# Patient Record
Sex: Male | Born: 1945 | Race: White | Hispanic: No | Marital: Married | Smoking: Never smoker
Health system: Southern US, Community
[De-identification: ages and names within clinical notes are randomized; demographics above are authoritative.]

## PROBLEM LIST (undated history)

## (undated) DIAGNOSIS — M549 Dorsalgia, unspecified: Secondary | ICD-10-CM

## (undated) DIAGNOSIS — J019 Acute sinusitis, unspecified: Secondary | ICD-10-CM

## (undated) DIAGNOSIS — R21 Rash and other nonspecific skin eruption: Secondary | ICD-10-CM

## (undated) DIAGNOSIS — I350 Nonrheumatic aortic (valve) stenosis: Secondary | ICD-10-CM

## (undated) DIAGNOSIS — E785 Hyperlipidemia, unspecified: Secondary | ICD-10-CM

## (undated) DIAGNOSIS — G8929 Other chronic pain: Secondary | ICD-10-CM

## (undated) DIAGNOSIS — C801 Malignant (primary) neoplasm, unspecified: Secondary | ICD-10-CM

## (undated) DIAGNOSIS — Z8546 Personal history of malignant neoplasm of prostate: Secondary | ICD-10-CM

## (undated) DIAGNOSIS — I712 Thoracic aortic aneurysm, without rupture: Secondary | ICD-10-CM

## (undated) DIAGNOSIS — K219 Gastro-esophageal reflux disease without esophagitis: Secondary | ICD-10-CM

## (undated) DIAGNOSIS — R1013 Epigastric pain: Secondary | ICD-10-CM

## (undated) DIAGNOSIS — D509 Iron deficiency anemia, unspecified: Secondary | ICD-10-CM

## (undated) DIAGNOSIS — R0602 Shortness of breath: Secondary | ICD-10-CM

## (undated) DIAGNOSIS — I2510 Atherosclerotic heart disease of native coronary artery without angina pectoris: Secondary | ICD-10-CM

## (undated) DIAGNOSIS — D72829 Elevated white blood cell count, unspecified: Secondary | ICD-10-CM

## (undated) DIAGNOSIS — R945 Abnormal results of liver function studies: Secondary | ICD-10-CM

## (undated) DIAGNOSIS — R109 Unspecified abdominal pain: Secondary | ICD-10-CM

## (undated) DIAGNOSIS — G4730 Sleep apnea, unspecified: Secondary | ICD-10-CM

## (undated) DIAGNOSIS — D126 Benign neoplasm of colon, unspecified: Secondary | ICD-10-CM

## (undated) DIAGNOSIS — I10 Essential (primary) hypertension: Secondary | ICD-10-CM

## (undated) DIAGNOSIS — M2550 Pain in unspecified joint: Secondary | ICD-10-CM

## (undated) DIAGNOSIS — K760 Fatty (change of) liver, not elsewhere classified: Secondary | ICD-10-CM

## (undated) DIAGNOSIS — M25475 Effusion, left foot: Secondary | ICD-10-CM

## (undated) DIAGNOSIS — M25474 Effusion, right foot: Secondary | ICD-10-CM

## (undated) DIAGNOSIS — M25471 Effusion, right ankle: Secondary | ICD-10-CM

## (undated) DIAGNOSIS — M1990 Unspecified osteoarthritis, unspecified site: Secondary | ICD-10-CM

## (undated) DIAGNOSIS — I272 Pulmonary hypertension, unspecified: Secondary | ICD-10-CM

## (undated) DIAGNOSIS — M793 Panniculitis, unspecified: Secondary | ICD-10-CM

## (undated) DIAGNOSIS — D759 Disease of blood and blood-forming organs, unspecified: Secondary | ICD-10-CM

## (undated) DIAGNOSIS — K449 Diaphragmatic hernia without obstruction or gangrene: Secondary | ICD-10-CM

## (undated) DIAGNOSIS — M25472 Effusion, left ankle: Secondary | ICD-10-CM

## (undated) HISTORY — DX: Shortness of breath: R06.02

## (undated) HISTORY — DX: Atherosclerotic heart disease of native coronary artery without angina pectoris: I25.10

## (undated) HISTORY — DX: Rash and other nonspecific skin eruption: R21

## (undated) HISTORY — DX: Benign neoplasm of colon, unspecified: D12.6

## (undated) HISTORY — DX: Morbid (severe) obesity due to excess calories: E66.01

## (undated) HISTORY — DX: Disease of blood and blood-forming organs, unspecified: D75.9

## (undated) HISTORY — DX: Iron deficiency anemia, unspecified: D50.9

## (undated) HISTORY — DX: Panniculitis, unspecified: M79.3

## (undated) HISTORY — DX: Pain in unspecified joint: M25.50

## (undated) HISTORY — DX: Unspecified abdominal pain: R10.9

## (undated) HISTORY — DX: Personal history of malignant neoplasm of prostate: Z85.46

## (undated) HISTORY — DX: Sleep apnea, unspecified: G47.30

## (undated) HISTORY — DX: Dorsalgia, unspecified: M54.9

## (undated) HISTORY — PX: OTHER SURGICAL HISTORY: SHX169

## (undated) HISTORY — DX: Effusion, right ankle: M25.471

## (undated) HISTORY — DX: Diaphragmatic hernia without obstruction or gangrene: K44.9

## (undated) HISTORY — DX: Nonrheumatic aortic (valve) stenosis: I35.0

## (undated) HISTORY — PX: TONSILLECTOMY: SUR1361

## (undated) HISTORY — DX: Hyperlipidemia, unspecified: E78.5

## (undated) HISTORY — DX: Thoracic aortic aneurysm, without rupture: I71.2

## (undated) HISTORY — DX: Fatty (change of) liver, not elsewhere classified: K76.0

## (undated) HISTORY — DX: Effusion, left foot: M25.475

## (undated) HISTORY — DX: Effusion, left ankle: M25.472

## (undated) HISTORY — DX: Acute sinusitis, unspecified: J01.90

## (undated) HISTORY — DX: Elevated white blood cell count, unspecified: D72.829

## (undated) HISTORY — DX: Epigastric pain: R10.13

## (undated) HISTORY — DX: Effusion, right ankle: M25.474

## (undated) HISTORY — DX: Abnormal results of liver function studies: R94.5

---
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
## 2007-01-28 ENCOUNTER — Inpatient Hospital Stay (HOSPITAL_COMMUNITY): Admission: EM | Admit: 2007-01-28 | Discharge: 2007-02-03 | Payer: Self-pay | Admitting: Emergency Medicine

## 2007-01-29 ENCOUNTER — Encounter: Payer: Self-pay | Admitting: Gastroenterology

## 2007-02-02 ENCOUNTER — Encounter: Admission: RE | Admit: 2007-02-02 | Discharge: 2007-02-02 | Payer: Self-pay | Admitting: Internal Medicine

## 2007-02-03 ENCOUNTER — Ambulatory Visit: Payer: Self-pay | Admitting: Internal Medicine

## 2007-02-14 ENCOUNTER — Ambulatory Visit: Payer: Self-pay | Admitting: Oncology

## 2007-02-18 ENCOUNTER — Inpatient Hospital Stay (HOSPITAL_COMMUNITY): Admission: EM | Admit: 2007-02-18 | Discharge: 2007-02-21 | Payer: Self-pay | Admitting: Emergency Medicine

## 2007-02-23 LAB — CBC WITH DIFFERENTIAL/PLATELET
Basophils Absolute: 0.1 10*3/uL (ref 0.0–0.1)
HCT: 29.8 % — ABNORMAL LOW (ref 38.7–49.9)
LYMPH%: 24.9 % (ref 14.0–48.0)
MCV: 83.9 fL (ref 81.6–98.0)
MONO%: 4.5 % (ref 0.0–13.0)
NEUT%: 66.5 % (ref 40.0–75.0)

## 2007-02-23 LAB — "ERYTHROCYTE SEDIMENTATION RATE ": Sed Rate: 86 mm/h — ABNORMAL HIGH (ref 0–20)

## 2007-02-23 LAB — CHCC SMEAR

## 2007-02-28 LAB — LACTATE DEHYDROGENASE: LDH: 184 U/L (ref 94–250)

## 2007-02-28 LAB — COMPREHENSIVE METABOLIC PANEL
ALT: 51 U/L (ref 0–53)
Albumin: 3.2 g/dL — ABNORMAL LOW (ref 3.5–5.2)
Alkaline Phosphatase: 193 U/L — ABNORMAL HIGH (ref 39–117)
CO2: 24 mEq/L (ref 19–32)
Glucose, Bld: 104 mg/dL — ABNORMAL HIGH (ref 70–99)
Potassium: 4.7 mEq/L (ref 3.5–5.3)
Sodium: 138 mEq/L (ref 135–145)
Total Protein: 7.5 g/dL (ref 6.0–8.3)

## 2007-02-28 LAB — JAK2 GENOTYPR

## 2007-03-01 ENCOUNTER — Ambulatory Visit: Payer: Self-pay | Admitting: Gastroenterology

## 2007-03-01 LAB — CONVERTED CEMR LAB
AST: 23 units/L (ref 0–37)
Alkaline Phosphatase: 103 units/L (ref 39–117)
Basophils Absolute: 0 10*3/uL (ref 0.0–0.1)
Basophils Relative: 0.1 % (ref 0.0–1.0)
CO2: 24 meq/L (ref 19–32)
Eosinophils Absolute: 0.2 10*3/uL (ref 0.0–0.6)
GFR calc Af Amer: 111 mL/min
HCT: 32.8 % — ABNORMAL LOW (ref 39.0–52.0)
Lymphocytes Relative: 35.6 % (ref 12.0–46.0)
MCHC: 33.4 g/dL (ref 30.0–36.0)
Monocytes Absolute: 0.7 10*3/uL (ref 0.2–0.7)
Neutro Abs: 5.9 10*3/uL (ref 1.4–7.7)
Neutrophils Relative %: 56.2 % (ref 43.0–77.0)
Platelets: 763 10*3/uL — ABNORMAL HIGH (ref 150–400)
Sodium: 136 meq/L (ref 135–145)

## 2007-03-15 LAB — CBC WITH DIFFERENTIAL/PLATELET
Basophils Absolute: 0.1 10*3/uL (ref 0.0–0.1)
EOS%: 2 % (ref 0.0–7.0)
Eosinophils Absolute: 0.2 10*3/uL (ref 0.0–0.5)
HGB: 12.5 g/dL — ABNORMAL LOW (ref 13.0–17.1)
LYMPH%: 37.3 % (ref 14.0–48.0)
MCH: 29.5 pg (ref 28.0–33.4)
MCV: 84.1 fL (ref 81.6–98.0)
MONO%: 5.8 % (ref 0.0–13.0)
Platelets: 319 10*3/uL (ref 145–400)
RDW: 18 % — ABNORMAL HIGH (ref 11.2–14.6)

## 2007-03-18 LAB — COMPREHENSIVE METABOLIC PANEL
AST: 25 U/L (ref 0–37)
Albumin: 4 g/dL (ref 3.5–5.2)
BUN: 21 mg/dL (ref 6–23)
Calcium: 9.5 mg/dL (ref 8.4–10.5)
Chloride: 105 mEq/L (ref 96–112)
Creatinine, Ser: 1.02 mg/dL (ref 0.40–1.50)
Glucose, Bld: 97 mg/dL (ref 70–99)
Potassium: 4.3 mEq/L (ref 3.5–5.3)

## 2007-03-18 LAB — IRON AND TIBC
%SAT: 11 % — ABNORMAL LOW (ref 20–55)
Iron: 39 ug/dL — ABNORMAL LOW (ref 42–165)
TIBC: 346 ug/dL (ref 215–435)
UIBC: 307 ug/dL

## 2007-03-18 LAB — IMMUNOFIXATION ELECTROPHORESIS: IgM, Serum: 84 mg/dL (ref 60–263)

## 2007-04-11 ENCOUNTER — Ambulatory Visit: Payer: Self-pay | Admitting: Gastroenterology

## 2007-04-11 LAB — CONVERTED CEMR LAB
ALT: 33 units/L (ref 0–53)
AST: 26 units/L (ref 0–37)
Alkaline Phosphatase: 54 units/L (ref 39–117)
Bilirubin, Direct: 0.1 mg/dL (ref 0.0–0.3)
Total Protein: 6.4 g/dL (ref 6.0–8.3)

## 2007-05-18 DIAGNOSIS — R1013 Epigastric pain: Secondary | ICD-10-CM | POA: Insufficient documentation

## 2007-05-18 DIAGNOSIS — J019 Acute sinusitis, unspecified: Secondary | ICD-10-CM | POA: Insufficient documentation

## 2007-05-18 DIAGNOSIS — R945 Abnormal results of liver function studies: Secondary | ICD-10-CM

## 2007-05-18 DIAGNOSIS — R109 Unspecified abdominal pain: Secondary | ICD-10-CM

## 2007-05-18 DIAGNOSIS — Z862 Personal history of diseases of the blood and blood-forming organs and certain disorders involving the immune mechanism: Secondary | ICD-10-CM

## 2007-05-18 DIAGNOSIS — Z8639 Personal history of other endocrine, nutritional and metabolic disease: Secondary | ICD-10-CM

## 2007-05-18 DIAGNOSIS — I517 Cardiomegaly: Secondary | ICD-10-CM

## 2007-05-18 DIAGNOSIS — K449 Diaphragmatic hernia without obstruction or gangrene: Secondary | ICD-10-CM | POA: Insufficient documentation

## 2007-05-18 DIAGNOSIS — Z6841 Body Mass Index (BMI) 40.0 and over, adult: Secondary | ICD-10-CM | POA: Insufficient documentation

## 2007-05-18 DIAGNOSIS — D509 Iron deficiency anemia, unspecified: Secondary | ICD-10-CM

## 2007-05-18 DIAGNOSIS — D759 Disease of blood and blood-forming organs, unspecified: Secondary | ICD-10-CM | POA: Insufficient documentation

## 2007-05-18 DIAGNOSIS — D72829 Elevated white blood cell count, unspecified: Secondary | ICD-10-CM | POA: Insufficient documentation

## 2007-05-18 HISTORY — DX: Epigastric pain: R10.13

## 2007-05-18 HISTORY — DX: Elevated white blood cell count, unspecified: D72.829

## 2007-05-18 HISTORY — DX: Diaphragmatic hernia without obstruction or gangrene: K44.9

## 2007-05-18 HISTORY — DX: Disease of blood and blood-forming organs, unspecified: D75.9

## 2007-05-18 HISTORY — DX: Acute sinusitis, unspecified: J01.90

## 2007-05-18 HISTORY — DX: Abnormal results of liver function studies: R94.5

## 2007-05-18 HISTORY — DX: Morbid (severe) obesity due to excess calories: E66.01

## 2007-05-18 HISTORY — DX: Unspecified abdominal pain: R10.9

## 2007-05-18 HISTORY — DX: Iron deficiency anemia, unspecified: D50.9

## 2008-06-17 ENCOUNTER — Emergency Department (HOSPITAL_COMMUNITY): Admission: EM | Admit: 2008-06-17 | Discharge: 2008-06-17 | Payer: Self-pay | Admitting: Emergency Medicine

## 2008-09-26 ENCOUNTER — Encounter: Admission: RE | Admit: 2008-09-26 | Discharge: 2008-09-26 | Payer: Self-pay | Admitting: Family Medicine

## 2010-06-12 ENCOUNTER — Other Ambulatory Visit (HOSPITAL_COMMUNITY): Payer: Self-pay | Admitting: Urology

## 2010-06-12 DIAGNOSIS — C61 Malignant neoplasm of prostate: Secondary | ICD-10-CM

## 2010-06-19 ENCOUNTER — Encounter (HOSPITAL_COMMUNITY)
Admission: RE | Admit: 2010-06-19 | Discharge: 2010-06-19 | Disposition: A | Discharge status: Home / Self Care | Payer: BC Managed Care – PPO | Source: Ambulatory Visit | Attending: Urology | Admitting: Urology

## 2010-06-19 ENCOUNTER — Ambulatory Visit (HOSPITAL_COMMUNITY): Payer: Self-pay

## 2010-06-19 DIAGNOSIS — C61 Malignant neoplasm of prostate: Secondary | ICD-10-CM

## 2010-07-07 ENCOUNTER — Other Ambulatory Visit (HOSPITAL_COMMUNITY): Payer: Self-pay | Admitting: Urology

## 2010-07-07 ENCOUNTER — Encounter (HOSPITAL_COMMUNITY): Payer: BC Managed Care – PPO

## 2010-07-07 ENCOUNTER — Inpatient Hospital Stay (HOSPITAL_COMMUNITY): Admission: RE | Admit: 2010-07-07 | Payer: BC Managed Care – PPO | Source: Ambulatory Visit

## 2010-07-07 DIAGNOSIS — C61 Malignant neoplasm of prostate: Secondary | ICD-10-CM

## 2010-07-09 ENCOUNTER — Encounter (HOSPITAL_COMMUNITY)
Admission: RE | Admit: 2010-07-09 | Discharge: 2010-07-09 | Disposition: A | Discharge status: Home / Self Care | Payer: BC Managed Care – PPO | Source: Ambulatory Visit | Attending: Urology | Admitting: Urology

## 2010-07-09 ENCOUNTER — Ambulatory Visit (HOSPITAL_COMMUNITY)
Admission: RE | Admit: 2010-07-09 | Discharge: 2010-07-09 | Disposition: A | Discharge status: Home / Self Care | Payer: BC Managed Care – PPO | Source: Ambulatory Visit | Attending: Urology | Admitting: Urology

## 2010-07-09 DIAGNOSIS — C61 Malignant neoplasm of prostate: Secondary | ICD-10-CM | POA: Insufficient documentation

## 2010-07-09 MED ORDER — TECHNETIUM TC 99M MEDRONATE IV KIT
25.0000 | PACK | Freq: Once | INTRAVENOUS | Status: AC | PRN
  Administered 2010-07-09: 25 via INTRAVENOUS

## 2010-07-22 ENCOUNTER — Ambulatory Visit: Payer: BC Managed Care – PPO | Attending: Radiation Oncology | Admitting: Radiation Oncology

## 2010-07-22 DIAGNOSIS — Z510 Encounter for antineoplastic radiation therapy: Secondary | ICD-10-CM | POA: Insufficient documentation

## 2010-07-22 DIAGNOSIS — C61 Malignant neoplasm of prostate: Secondary | ICD-10-CM | POA: Insufficient documentation

## 2010-08-26 NOTE — H&P (Signed)
Curtis Hill, REASON NO.:  [id number]   MEDICAL RECORD NO.:  [id number]          PATIENT TYPE:  INP   LOCATION:  5023                         FACILITY:  [facility]   PHYSICIAN:  Kela Millin, M.D.DATE OF BIRTH:  04-Jun-1945   DATE OF ADMISSION:  02/18/2007  DATE OF DISCHARGE:                              HISTORY & PHYSICAL   PRIMARY CARE PHYSICIAN:  Erin E Black, DO.   CHIEF COMPLAINT:  Persistent fevers and abdominal pain.   HISTORY OF PRESENT ILLNESS:  The patient is a 65 year old obese white  male with a past medical history significant for hypertension and gout  recently discharged from the hospital on February 03, 2007, following  extensive evaluation of fevers and abdominal pain.  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 Jacobs/gastroenterology, and that showed  no biliary ductal dilatation, also no strictures and no gallstones noted  at the time.  His MRI also showed upper abdominal lymph nodes and a  portacaval node up to 2.1 cm.  He was found to have elevated LFTs and a  hepatitis panel was done and this was negative.  The LFTs were improving  by the time he was discharged, the patient was also found to have  chronic leukocytosis and after being on antibiotics in the hospital, he  was discharged with a white cell count of 13.9 and he had defervesced  while in the hospital - there was noted in reviewing his discharge  summary that allopurinol had been discontinued because it was thought  that this was a possible cause of his leukocytosis.  The patient reports  that since being discharged, he has continued to have fevers up to 100.5  and has had to be taking Tylenol frequently to keep the fevers down.  He  also reports that he had still been  having abdominal pain and that when  he followed up with his primary care physician, he was started on  Prilosec which helped relieve the mid abdominal/epigastric pains that he  had been having.  He states that he has continued to have pain in his  right upper and left upper quadrants.  He describes this pain as strong  dull ache, 5-10/10 in intensity, and that on the day prior to admission  his PCP started him on Vicodin which is the only thing that has seemed  to help.  He denies nausea and vomiting, diarrhea and he states that the  pain is not worsened by food.  Mr. Curtis Hill denies dysuria, back pain,  chest pain, shortness of breath, leg swelling, melena and no  hematochezia.  He admits to night sweats since he was discharged and  that he has had a cough which is nonproductive as well as a post nasal  drip.  He admits to the 75 pound weight loss over a period  of about  seven months (since April), he states that this has been intentional and  that he was actually on the [employer] diet for some time and that was  how he lost the weight.  His primary care physician also reports that  his hemoglobin has continued to drift down since his discharge.  The  last report was a hemoglobin of about 10.   The patient was seen in the ER and a repeat CT scan of his abdomen was  done which showed no acute findings - no significant change from a  recent CT and MRI of his abdomen, multiple small hypodensities in the  upper liver noted, likely benign given his recent MRI, also periportal  adenopathy unchanged and nonspecific per radiologist.  His temperature  in the ER was 101.9 with blood pressure of 132/89 and his white cell  count was 18.3, hemoglobin 9.8 with a hematocrit of 29.2.  His lipase  was within normal limits at 19 and his LFTs had continued to improve,  alkaline phosphatase 209 with the rest of the LFTs today within normal  limits.  He is admitted for further evaluation and management.    PAST MEDICAL HISTORY:  1. As above.  2. History of cardiomegaly.   MEDICATIONS:  1. Lisinopril/hydrochlorothiazide 20/12.5 mg.  2. Nexium 40 mg daily.  3. Colchicine.  4. Vitamin A   ALLERGIES:  NKDA.   SOCIAL HISTORY:  He denies tobacco.  He also denies alcohol.   FAMILY HISTORY:  His mother had hypertension and diabetes.  No cancers  reported in his family.   REVIEW OF SYSTEMS:  As per HPI, other review of systems negative.   PHYSICAL EXAMINATION:  GENERAL APPEARANCE:  The patient is an obese,  older white male.  He is in no apparent distress.  VITAL SIGNS:  His temperature is 101.9 with a blood pressure of 132/89,  pulse of 94, respiratory rate of 16, O2 sat of 100%.  HEENT:  PERRLA, EOMI, sclerae anicteric, moist mucous membranes and no  oral exudates.  NECK:  Supple, no adenopathy, no thyromegaly and no JVD.  LUNGS:  Decreased breath sounds at the bases, otherwise clear to  auscultation.  CARDIOVASCULAR:  Regular rate and rhythm.  Normal S1, S2, no murmurs  appreciated.  ABDOMEN:  Soft, bowel sounds present, nontender, nondistended.  No  organomegaly and no masses palpable.  EXTREMITIES:  No cyanosis and no edema.  NEUROLOGIC:  She is alert and oriented x3.  Cranial nerves II-XII are  grossly intact.  Nonfocal exam.   LABORATORY DATA:  As per HPI.  His platelet count is 507 with a  neutrophil count of 80%.  Sodium is 136 with a potassium of 3.8,  chloride 101, CO2 of 26, glucose 109, BUN 16, creatinine 1.04.  His AST  is 30, ALT 50, total bilirubin is 0.7, alkaline phosphatase 209,  improved from 230 on February 02, 2007.  His lipase is 19.   ASSESSMENT AND PLAN:  1. Persistent fevers - will obtain blood and urine cultures.  Will      also do a CT scan of his sinuses, as noted above.  CT scan of his      abdomen is unchanged.  The patient is  nontoxic-appearing at this      time, will follow and start antibiotics as clinically indicated      pending above studies.   2. Abdominal pain - as discussed above and status post extensive  workup and examination of his abdomen is rather benign - nontender      with no rebound, and it is noted that his LFTs have improved and      his lipase is within normal limits today.  Also the CT scan is      unchanged as already stated above.  Will continue PPI, pain      management, follow and consult gastroenterology, Dr. Christella Hartigan, for      further recommendations.  3. Anemia - will monitor hemoglobin and hematocrit, obtain stool      guaiacs, further evaluate and manage pending the studies.  4. Leukocytosis - as discussed above, cultures, follow and treat      accordingly.  5. Hypertension - continue Lexapro.  6. Gout - continue colchicine.      Kela Millin, M.D.  Electronically Signed     ACV/MEDQ  D:  02/19/2007  T:  02/20/2007  Job:  [id number]   cc:   Carma Leaven, DO  Rachael Fee, MD

## 2010-08-26 NOTE — Discharge Summary (Signed)
NAMERAFI, KENNETH NO.:  [id number]   MEDICAL RECORD NO.:  [id number]          PATIENT TYPE:  INP   LOCATION:  5023                         FACILITY:  [facility]   PHYSICIAN:  Kela Millin, M.D.DATE OF BIRTH:  08/10/1945   DATE OF ADMISSION:  02/18/2007  DATE OF DISCHARGE:  02/21/2007                               DISCHARGE SUMMARY   DISCHARGE DIAGNOSES:  1. Acute sinusitis, left maxillary.  2. Anemia, mixed.  Hemoccult-negative x3.  3. Abdominal pain , resolved.  CT scan of abdomen and pelvis unchanged      and liver function tests improved.  4. Hypertension.  5. Gout.  6. Morbid obesity.  7. Leukocytosis, improved on antibiotics for number #1, white cell      count today on discharge 12.1, fevers resolved.   PROCEDURES AND STUDIES:  1. CT scan of abdomen and pelvis:  No significant interval change from      comparison CT and MRI of January 28, 2007, and February 02, 2007,      respectively.  Hypodensities within the liver, likely benign as      described on recent MRI.  Periportal adenopathy, nonspecific and      not significantly changed.  Simple left renal cyst.  CT scan of      pelvis shows no acute process within the pelvis, diverticulosis      without evidence of acute inflammation.  2. CT scan of head:  Negative.  3. Maxillofacial CT scan:  Left maxillary sinus air-fluid level      consistent with acute sinusitis.   BRIEF HISTORY:  The patient is a 65 year old white male with the above-  listed medical problems, who presented with persistent fevers and  abdominal pain since he was discharged from the hospital on October 23.  It was noted that he has undergone extensive evaluation prior to  discharge late October.  Since his discharge he had continued to have  fevers up to 100.5.  He has also continued to have a leukocytosis, 18 on  admission.  The patient reported that Prilosec started per his primary  care physician had helped some of his  abdominal pain but he still had  some of that persisting.  He denied diarrhea, nausea and vomiting,  dysuria, melena, and no hematochezia.  His primary care physician had  also noted a gradual decline of his hemoglobin and on admission and it  was noted to be 9.8.  His LFTs had also improved from his previous  tests.   Please see the full admission history and physical of February 18, 2007,  for details of the admission physical exam as well as laboratory data.   HOSPITAL COURSE:  Problem 1.  ACUTE SINUSITIS:  Upon admission the patient had blood and  urine cultures.  A CT scan of his sinuses was also done and the results  as stated above are consistent with acute maxillary sinusitis.  The  patient's blood cultures did not grow any bacteria and the urine  cultures revealed 35,000 CFUs of diphtheroids and the impression was  that this  was most likely a contaminant.  The patient was placed on an  antibiotics for sinusitis.  Following the CT results and he defervesced,  his white cell count actually gradually improved and it is 12.1 today.  He will be discharged on oral antibiotics as well as Claritin D, and he  is to follow up with his primary care physician.   Problem 2.  ANEMIA, MIXED:  The patient's hemoglobin was monitored  during his hospital stay.  Stool guaiacs were also done and these were  negative x3.  An anemia panel was done and it revealed a reticulocyte  count of 0.6, iron level of 16, TIBC of 178 and the ferritin 616, folate  greater than 20, vitamin B12 784.  The patient indicated that his last  colonoscopy was about a year ago per [facility] and, per his report, this  was within normal limits.  The patient has an appointment scheduled with  Dr. Clelia Croft at the cancer center and he has been discharged on iron and  is to follow up with Dr. Clelia Croft for further evaluation and management.   Problem 3.  ABDOMINAL PAIN:  Resolved.  The patient had had extensive  workup as detailed  in his admission history and physical.  He was  maintained on Prilosec during his hospital stay.  He is to continue this  upon discharge and follow up with GI as needed.   Problem 4.  HYPERTENSION:  The patient was maintained on his outpatient  medications during his hospital stay.   Problem 5.  GOUT:  The patient was maintained on colchicine during his  hospital stay.   DISCHARGE MEDICATIONS:  1. Ceftin 500 mg one p.o. b.i.d. for 7 more days.  2. Nu-Iron 150 mg p.o. b.i.d.  3. Senokot two p.o. q.h.s. p.r.n. constipation.  4. Claritin-D one tablet daily.  5. Lisinopril 20 mg daily.  6. Hydrochlorothiazide 12.5 mg daily.  7. Colchicine 0.6 mg b.i.d.  8. Multivitamin daily.  9. Calcium daily.   DISCHARGE CONDITION:  Improved/stable.      Kela Millin, M.D.  Electronically Signed     ACV/MEDQ  D:  02/21/2007  T:  02/22/2007  Job:  [id number]   cc:   Carma Leaven, DO  Blenda Nicely. Elisabeth Most, MD

## 2010-08-26 NOTE — Consult Note (Signed)
NAME:  Curtis Hill, Curtis Hill NO.:  [id number]   MEDICAL RECORD NO.:  [id number]          PATIENT TYPE:  INP   LOCATION:  6705                         FACILITY:  [facility]   PHYSICIAN:  Rachael Fee, MD   DATE OF BIRTH:  [birth year]   DATE OF CONSULTATION:  DATE OF DISCHARGE:                                 CONSULTATION   PRIMARY CARE PHYSICIAN:  Carma Leaven, DO at [facility].   REASON FOR REFERRAL:  Dr. Adela Glimpse asked me to evaluate Curtis Hill in  consultation regarding fever, elevated white count, abnormal liver test.   HISTORY OF PRESENT ILLNESS:  [name] is a very pleasant 65 year old  man who has had intermittent epigastric discomforts over the past week  or so.  He describes it as gassy in some respects and crampy in others.  Food definitely tends to bring it on.  He was having fevers and chills  at home, presented to his primary care physician.  He had a CBC checked  that showed a white count.  He was tried on Nexium and that did not seem  to improve things.  Fevers increased and so he was presented to the  emergency room for evaluation.  In the emergency room, he had the labs  drawn showing a white count of 21,000, hemoglobin of 10.8, normal  platelets with a left shift.  Liver tests showed a total bilirubin of  1.4, alkaline phosphatase of 128, AST of 58, ALT of 87, lipase was  normal.  He had imaging tests last night performed and abdominal  ultrasound showed no definitive sludge or stones in his gallbladder but  his common duct measured 7.8 mm which is slightly dilated.  Quite obese  and the exam was limited due to his size.  Gallbladder showed no signs  of acute cholecystitis.  He had a followup CT scan with IV and oral  contrast.  This showed some fatty infiltration of his liver, question of  focal fat in his liver, a lesion measuring 2.8 x 2.7 cm.  He was  admitted, put on IV antibiotics (Zosyn).  Labs were rechecked this  morning showing his  liver tests had increased to a total bilirubin of  2.0 and alkaline phosphatase of 128, AST of 62, and ALT of 96.  His  white count has increased to 24,000.  He had low grade fevers last night  to 100.4.   REVIEW OF SYSTEMS:  Notable for fevers, chills, abdominal discomfort.  The rest of his review of systems is essentially normal and is available  on his admission H&P.   PAST MEDICAL HISTORY:  Hypertension, gout, morbid obesity.   CURRENT OUTPATIENT MEDICINES:  Lisinopril, hydrochlorothiazide,  allopurinol, Nexium, etodolac taken p.r.n. for pain.  He was started on  Zosyn when he was admitted.   ALLERGIES:  No known drug allergies.   SOCIAL HISTORY:  No tobacco, no alcohol, no drug abuse.   FAMILY HISTORY:  No cancers run in his family.   PHYSICAL EXAMINATION:  VITAL SIGNS:  Temperature this morning was 100.1,  pulse  90, blood pressure 105/68, saturating 96% on room air.  GENERAL:  Compositionally, he is quite obese, otherwise, well appearing.  NEUROLOGIC:  Alert and oriented x3.  HEENT:  Eyes:  Extraocular movements intact.  Mouth and oropharynx  moist, no lesions.  NECK:  Supple, no lymphadenopathy.  CARDIOVASCULAR:  Regular rate and rhythm.  LUNGS:  Clear to auscultation bilaterally.  ABDOMEN:  Soft, mildly tender in the mid epigastrium, nondistended,  normal bowel sounds, no peritoneal signs.  EXTREMITIES:  No lower extremity edema.  SKIN:  No rashes, lesions on bilaterally lower extremities.   ASSESSMENT AND PLAN:  A 65 year old man with fever, chills, increasing  white count and liver tests.  Slightly dilated bile duct of 7.8 mm.   Clinically this is very suspicious for bacterial cholangitis.  He does  have no stones or sludge in his gallbladder, but it was a technically  limited study due to his body size and I suspect he indeed does have  gallstone disease.  We have made arrangements for him to have an ERCP  performed today.  I spoke with his family and he about  possible  complications with ERCP including perforation, bleeding and  pancreatitis.  They understand and wish to proceed.      Rachael Fee, MD  Electronically Signed     DPJ/MEDQ  D:  01/29/2007  T:  01/30/2007  Job:  [id number]   cc:   Carma Leaven, DO  Pollyann Savoy, MD

## 2010-08-26 NOTE — Discharge Summary (Signed)
Curtis Hill, Curtis Hill NO.:  [id number]   MEDICAL RECORD NO.:  [id number]          PATIENT TYPE:  INP   LOCATION:  6714                         FACILITY:  [facility]   PHYSICIAN:  Michelene Gardener, MD    DATE OF BIRTH:  [birth year]   DATE OF ADMISSION:  01/28/2007  DATE OF DISCHARGE:  02/03/2007                               DISCHARGE SUMMARY   PRIMARY CARE PHYSICIAN:  Al Decant. Janey Greaser, M.D.   DISCHARGE DIAGNOSES:  1. Abnormal elevation of liver function test.  2. Cardiomegaly.  3. Hypertension.  4. Gout.  5. Morbid obesity.  6. Chronic leukocytosis, baseline between 13-14.   NEW MEDICATIONS:  Colchicine 0.6 mg p.o. twice daily.   OLD MEDICATIONS:  1. Lisinopril/hydrochlorothiazide 20/12.5 mg p.o. once daily.  2. Aspirin 81 mg p.o. once daily.   MEDICATION TO DISCONTINUE:  Allopurinol   CONSULTATIONS:  GI consult done January 29, 2007.   PROCEDURES:  None.   RADIOLOGY STUDIES:  1. Ultrasound of the abdomen done January 28, 2007, and it showed      echogenic focus in the left hepatic loop likely representing      calcification.  2. Chest x-ray on January 28, 2007 showed cardiomegaly with no acute      disease.  3. CT scan of the abdomen on January 28, 2007 showed fatty      infiltration of the liver with a nonspecific 2.8 cm focal lesion in      the dome of the right hepatic loop.  There is no biliary      dilatation.  4. CT scan of the pelvis without contrast done on January 28, 2007      showed no acute problem.  5. ERCP showed no evidence of biliary ductal dilatation and no      restrictions and there were no gallstones.  6. 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      There are also lymph nodes in the upper abdomen  with a portal caval      node measuring up to 2.1 cm in the short axis.   COURSE OF HOSPITALIZATION:  1. Abnormal elevation of liver function tests.  This patient was      admitted to the hospital for further evaluation.  As mentioned      above, ultrasound was done initially, and it showed a questionable      hepatic loop lesion.  Further evaluation was done by CT scan of the      abdomen, and it showed around a 2.8 cm focal lesion in the right      hepatic loop.  The patient was seen by gastroenterology who      recommended this patient to have ERCP, so the patient underwent  ERCP and that came to be normal and showed no biliary dilatation.      After that, the patient was recommended for MRI of the abdomen to      make sure there was no abscess, given the fact that the patient has      been having elevated white counts.  Open MRI was done, and it came      to be normal, and the lesion that was initially seen in the CAT      scan was not seen in the MRI and is thought to be only focal fat.      Initially, there was some thought about doing a liver biopsy.      Then, after the MRI came to be normal, the gastroenterologist      recommended not to go with biopsy at this time and only to follow      this patient on an outpatient basis.  2. Cardiomegaly.  That was seen on his chest x-ray, and it might be      related to his underlying hypertension, and the patient underwent      echocardiogram, and that showed a normal ejection fraction without      any wall abnormalities.  3. Lymphadenopathy.  The MRI showed some enlarged lymph nodes in the      upper abdomen.  I discussed those findings with the      gastroenterologist who recommended to repeat the CAT scan in 3-6      months.  This will be followed as an outpatient by GI.  Repeat CT      might need to be done just to exclude the possibility of lymphoma.      Those findings were discussed with the patient, and the patient is       aware.  4. Chronic leukocytosis.  Going through his records, this patient has      a white count ranging between 13 and 14 in the last 3 years.  When      he came initially, his white count was elevated, and that raised      the possibility of underlying infection, and, as mentioned above,      liver abscess has been ruled out, and there is no other source of      infection.  At the time of discharge, his white count got back to      his baseline, and it was 13.9.  5. Gout.  His allopurinol was discontinued because it might have      contributed to his leukocytosis, and the patient was switched to      colchicine.  6. Morbid obesity.  The patient was advised about the risk.   TOTAL ASSESSMENT TIME:  Forty minutes.      Michelene Gardener, MD  Electronically Signed     NAE/MEDQ  D:  02/03/2007  T:  02/03/2007  Job:  [id number]   cc:   Al Decant. Janey Greaser, MD  Rachael Fee, MD

## 2010-08-26 NOTE — Assessment & Plan Note (Signed)
[facility]                         GASTROENTEROLOGY OFFICE NOTE   NOMAR, BROAD                          MRN:          [id number]  DATE:04/11/2007                            DOB:          [birth year]    PRIMARY CARE PHYSICIAN:  Dr. Carma Leaven of [facility].   HEMATOLOGIST:  Dr. Blenda Nicely. Shadad.   GI PROBLEM LIST:  Abnormal liver tests.  Presented October 2008 with  abdominal pain, fever, elevated white count, rising transaminases and  bilirubin, slightly dilated common bile duct at 7.8 mm.  Normal  gallbladder without stones on ultrasound.  ERCP performed by me January 29, 2007 showed non-dilated bowel loop without filling defects.  Biliary  sphincterotomy was performed empirically.  Thought he had possibly  already passed the stone.  Multiple imaging studies, including CT scans  IV and oral contrast, MRI were performed because his liver tests did not  improve following the ERCP.  Did not show any specific gallbladder,  biliary, or hepatic problem.  He was sent home on empiric antibiotics  and continued to have fevers and was readmitted to the hospital for  continued workup.  Repeat CT scan of his abdomen showed no changes.  Did  have some mild periportal adenopathy, which was nonspecific.  CT scan of  his head showed left maxillary acute sinusitis on maxillofacial CT scan.  Repeat liver tests in November 2008, were all normal.   INTERVAL HISTORY:  I last saw Mr. Rothman six weeks ago.  Since then he  has felt complete well.  He has had no fevers or chills.  No abdominal  pains.  No nausea or vomiting.   CURRENT MEDICATIONS:  1. Prilosec.  2. Lisinopril..  3. Colchicine.  4. Aspirin.  5. Fish oil.  6. Probiotic.  7. Claritin.  8. Multivitamin.  9. Calcium.  10.Co-Enzyme Q10.  11.Ferrex 150 mg once nightly.   PHYSICAL EXAMINATION:  VITAL SIGNS:  Weight 356 pounds, which is up 35  pounds since his last visit.  (He said it  is not unusual for him to have  15-20 pound swings in very short intervals like this.)  Blood pressure  130/80, pulse 72.  CONSTITUTIONAL:  Generally he is morbidly obese, otherwise well-  appearing.  ABDOMEN:  Soft, nontender, non-distended.  Normal bowel sounds.   ASSESSMENT/PLAN:  A 65 year old man with resolved abnormal liver tests,  resolved fevers.   I did not mention above that Dr. Clelia Croft recently evaluated Mr. Andel as  well, and did not believe that he had any hematologic problems.  He  recommended that Mr. Bula remain on iron once daily until his  prescription runs out.  I think that is very reasonable.  I still do not  have an explanation for his abnormal liver tests.  They probably were  reactive to some type of systemic illness, possibly the sinusitis he was  confirmed to have by CT scan.  He will have repeat liver tests today and  if those are indeed normal, then I see no reason for any further  scheduled GI  workup.   He knows how to get in touch, if he has any further issues.     Rachael Fee, MD  Electronically Signed    DPJ/MedQ  DD: 04/11/2007  DT: 04/11/2007  Job #: [id number]   cc:   Carma Leaven, DO  Blenda Nicely. [employer]

## 2010-08-26 NOTE — H&P (Signed)
NAMEWILBERTH, Curtis Hill NO.:  [id number]   MEDICAL RECORD NO.:  [id number]          PATIENT TYPE:  INP   LOCATION:  1827                         FACILITY:  [facility]   PHYSICIAN:  Michiel Cowboy, MDDATE OF BIRTH:  [birth year]   DATE OF ADMISSION:  01/28/2007  DATE OF DISCHARGE:                              HISTORY & PHYSICAL   STAT PRIORITY ADMISSION HISTORY AND PHYSICAL   PRIMARY CARE PHYSICIAN:  Carma Leaven, DO, with [facility].   Mr. Usman is a gentleman with a history of hypertension, who presents  with abdominal pain as a chief complaint.   The patient was doing well up until one week ago when he developed  epigastric pain, which was continuous in nature.  The patient also  developed what he felt was exsessive gas and abdominal distention.  He  tried to take Gas-X but with no improvement.  He went to see his primary  care physician, who thought that maybe he will benefit from Nexium,  which he started with also no improvement.  The patient had his CBC  checked, which showed elevation in his white blood cell count, and he  was sent to the emergency department for further evaluation.   PAST MEDICAL HISTORY:  1. Hypertension.  2. Gout.   FAMILY HISTORY:  Mother with diabetes and hypertension; no cancers in  the family.   SOCIAL HISTORY:  No tobacco, no alcohol, no drug abuse.   DRUG ALLERGIES:  No known drug allergies.   MEDICATIONS:  1. Lisinopril/hydrochlorothiazide 20/12.5 mg p.o. once a day.  2. Allopurinol 300 mg p.o. once a day.  3. Nexium 40 mg p.o. once a day.  4. Etodolac; the patient was taking this p.r.n. for pain.   REVIEW OF SYSTEMS:  Significant for fevers, chills, and headache.  No  nausea and no vomiting.  No diarrhea and no change in stool.  No urinary  symptoms.  No neurological symptoms.  No chest pain and no shortness of  breath.   PHYSICAL EXAMINATION:  VITAL SIGNS:  Temperature 102.0, pulse 107,  respirations  18, blood pressure 145/92.  GENERAL:  The patient appears to be well and in no acute distress,  complaining of mild abdominal pain.  HEENT:  Moist mucous membranes, PERRLA.  NECK:  Obese, but no lymphadenopathy.  LUNGS:  Clear to auscultation bilaterally.  HEART:  Regular rate and rhythm but rapid, no murmurs noted.  ABDOMEN:  Distended, there is right upper quadrant tenderness as well as  epigastric tenderness, not acute abdomen, no rebound and no guarding  noted.  EXTREMITIES:  Without edema.  NEUROLOGICAL:  Nonfocal.   LABORATORY DATA:  Significant for white blood cell count of 20.8, H&H of  10.8/32.6, platelets of 346, lipase 16, sodium 136, potassium 3.7,  creatinine 1.0, calcium 8.6, albumin 2.9, AST 53, ALT 87, alk-phos 128,  total bili 1.4.  Urine negative for white blood cells but does have  ketones and trace protein.   RADIOLOGICAL STUDIES:  A right upper quadrant ultrasound showing a  technically limited exam.  There is a  left hepatic lobe calcification,  no cholelithiasis, no acute cholecystitis, borderline enlargement of  common bile duct.  A CT of the abdomen I have discussed with Radiology  and on preliminary evaluation, no biliary abnormalities were noted.  The  patient does have a cyst on his kidney, but no pancreatitis, no  gallbladder stones were noted, and no bowel obstruction.  A final read  is not up yet.   ASSESSMENT AND PLAN:  This is a 65 year old gentleman with abdominal  pain and fever, a slight elevation of liver function tests, a question  of cholangitis.  1. Abdominal pain, fever, a question of cholangitis:  Will admit to      [facility].  Have talked over the phone with Dr. Christella Hartigan of      [facility], who will see patient in the morning.  Will hold off on      an endoscopic retrograde cholangiopancreatography (ERCP) at this      point or magnetic cholangiopancreatography (MRCP) and see how      patient does.  Will check his liver function  tests, wait for his      other blood culture, give Zosyn for potential infectious sources.      Will also obtain chest x-ray to watch for any other etiologies of      infection and keep patient nil per os (NPO).  Doubt will continue      Allopurinol.  2. Hypertension:  Will hold his antihypertensives for now while      patient acutely ill.  3. Prophylaxis:  Will choose sequential compression devices instead of      Lovenox given the patient may need invasive procedure potentially.      The patient is a Full Code.      Michiel Cowboy, MD  Electronically Signed     AVD/MEDQ  D:  01/28/2007  T:  01/28/2007  Job:  [id number]

## 2010-08-26 NOTE — Assessment & Plan Note (Signed)
HEALTHCARE                         GASTROENTEROLOGY OFFICE NOTE   DELTA, DESHMUKH                          MRN:          [id number]  DATE:03/01/2007                            DOB:          September 27, 1945    PRIMARY CARE PHYSICIAN:  Dr. Rosey Bath of [facility].   HEMATOLOGIST:  Dr. Clelia Croft.   GI PROBLEM LIST:  Abnormal liver tests.  Presented October 2008 with  abdominal pain, fever, elevated white count, rising transaminases and  bilirubin, slightly dilated common bile duct at 7.8 mm.  Normal  gallbladder without stones on ultrasound.  ERCP performed by me January 29, 2007 showed non-dilated bowel loop without filling defects.  Biliary  sphincterotomy was performed empirically.  Thought he had possibly  already passed the stone.  Multiple imaging studies, including CT scans  IV and oral contrast, MRI were performed because his liver tests did not  improve following the ERCP.  Did not show any specific gallbladder,  biliary, or hepatic problem.  He was sent home on empiric antibiotics  and continued to have fevers and was readmitted to the hospital for  continued workup.  Repeat CT scan of his abdomen showed no changes.  Did  have some mild periportal adenopathy, which was nonspecific.  CT scan of  his head showed left maxillary acute sinusitis on maxillofacial CT scan.   INTERVAL HISTORY:  I last saw Mr. Elison when he was hospitalized in  October of 2008.  His recent course is summarized above.  No etiology  was ever found for his abnormal liver tests.  Hepatitis A, B, and C were  negative.  Imaging studies failed to show any definite hepatic or  biliary abnormalities.  Fortunately, his liver tests have normalized.  The fevers he was having around the same time may be from the acute  sinusitis that was eventually detected by head CT.  He does have chronic  anemia.  He has actually been seen by Dr. Clelia Croft about that, who thought  that  possibly he had a virus or bacterial infection that has caused some  bone marrow suppression.  Stool studies have been done 3 times recently  and they were all negative.  He has had a colonoscopy 1 year ago by Dr.  Doy Mince as an outpatient.   CURRENT MEDICATIONS:  Prilosec.  Lisinopril.  Colchicine.  Aspirin.  Fish oil.  Probiotic.  Claritin.  Multivitamin.  Calcium.  Coenzyme Q-  10.  Iron.   PHYSICAL EXAM:  Height 5 feet 10 inches, 321 pounds.  Blood pressure  124/84, pulse 120.  CONSTITUTIONAL:  Well-appearing.  ABDOMEN:  Soft, nontender, nondistended.  Normal bowel sounds.   ASSESSMENT AND PLAN:  A 65 year old man with resolved abnormal liver  tests, resolving fevers.   It is not clear what led to Mr. Canavan constellation of symptoms over  the past 1 to 2 months.  He is very clear that he has no abdominal pain  and, actually since starting Prilosec on a daily basis, he has no  bloating or dyspeptic-type symptoms.  He says his  fevers are gone.  Possibly the sinusitis is accounting for his fevers.  I think I would  simply like to observe him clinically.  I  will repeat a set of labs today, including a CBC, complete metabolic  profile.  He will return to see me in 1 month's time.     Rachael Fee, MD  Electronically Signed    DPJ/MedQ  DD: 03/01/2007  DT: 03/02/2007  Job #: [id number]   cc:   Carma Leaven, DO  Blenda Nicely. [employer]

## 2010-10-20 ENCOUNTER — Ambulatory Visit
Admission: RE | Admit: 2010-10-20 | Discharge: 2010-10-20 | Disposition: A | Discharge status: Home / Self Care | Payer: BC Managed Care – PPO | Source: Ambulatory Visit | Attending: Radiation Oncology | Admitting: Radiation Oncology

## 2010-10-20 DIAGNOSIS — Z510 Encounter for antineoplastic radiation therapy: Secondary | ICD-10-CM | POA: Insufficient documentation

## 2010-10-20 DIAGNOSIS — C61 Malignant neoplasm of prostate: Secondary | ICD-10-CM | POA: Insufficient documentation

## 2010-10-20 DIAGNOSIS — R5381 Other malaise: Secondary | ICD-10-CM | POA: Insufficient documentation

## 2010-11-17 ENCOUNTER — Ambulatory Visit (HOSPITAL_COMMUNITY): Payer: BC Managed Care – PPO

## 2010-11-17 ENCOUNTER — Ambulatory Visit (HOSPITAL_COMMUNITY)
Admission: RE | Admit: 2010-11-17 | Discharge: 2010-11-17 | Disposition: A | Discharge status: Home / Self Care | Payer: BC Managed Care – PPO | Source: Ambulatory Visit | Attending: Radiation Oncology | Admitting: Radiation Oncology

## 2010-11-17 ENCOUNTER — Other Ambulatory Visit: Payer: Self-pay | Admitting: Radiation Oncology

## 2010-11-17 DIAGNOSIS — I10 Essential (primary) hypertension: Secondary | ICD-10-CM | POA: Insufficient documentation

## 2010-11-17 DIAGNOSIS — D72829 Elevated white blood cell count, unspecified: Secondary | ICD-10-CM | POA: Insufficient documentation

## 2010-11-17 DIAGNOSIS — Z79899 Other long term (current) drug therapy: Secondary | ICD-10-CM | POA: Insufficient documentation

## 2010-11-17 DIAGNOSIS — C61 Malignant neoplasm of prostate: Secondary | ICD-10-CM

## 2010-11-17 DIAGNOSIS — D649 Anemia, unspecified: Secondary | ICD-10-CM | POA: Insufficient documentation

## 2010-11-17 DIAGNOSIS — R9431 Abnormal electrocardiogram [ECG] [EKG]: Secondary | ICD-10-CM | POA: Insufficient documentation

## 2010-11-17 DIAGNOSIS — D473 Essential (hemorrhagic) thrombocythemia: Secondary | ICD-10-CM | POA: Insufficient documentation

## 2010-12-01 ENCOUNTER — Encounter (HOSPITAL_COMMUNITY): Payer: BC Managed Care – PPO

## 2010-12-01 ENCOUNTER — Other Ambulatory Visit: Payer: Self-pay | Admitting: Urology

## 2010-12-01 LAB — BASIC METABOLIC PANEL
BUN: 18 mg/dL (ref 6–23)
CO2: 30 mEq/L (ref 19–32)
Calcium: 10 mg/dL (ref 8.4–10.5)
Creatinine, Ser: 0.86 mg/dL (ref 0.50–1.35)
GFR calc Af Amer: >60 mL/min (ref >60)
GFR calc non Af Amer: >60 mL/min (ref >60)
Glucose, Bld: 91 mg/dL (ref 70–99)
Potassium: 3.7 mEq/L (ref 3.5–5.1)
Sodium: 138 mEq/L (ref 135–145)

## 2010-12-01 LAB — CBC
Hemoglobin: 12.2 g/dL — ABNORMAL LOW (ref 13.0–17.0)
MCHC: 32.4 g/dL (ref 30.0–36.0)

## 2010-12-01 LAB — SURGICAL PCR SCREEN: Staphylococcus aureus: NEGATIVE

## 2010-12-10 ENCOUNTER — Ambulatory Visit (HOSPITAL_COMMUNITY)
Admission: RE | Admit: 2010-12-10 | Discharge: 2010-12-10 | Disposition: A | Discharge status: Home / Self Care | Payer: BC Managed Care – PPO | Source: Ambulatory Visit | Attending: Urology | Admitting: Urology

## 2010-12-10 ENCOUNTER — Ambulatory Visit (HOSPITAL_COMMUNITY): Payer: BC Managed Care – PPO

## 2010-12-10 DIAGNOSIS — Z01812 Encounter for preprocedural laboratory examination: Secondary | ICD-10-CM | POA: Insufficient documentation

## 2010-12-10 DIAGNOSIS — M109 Gout, unspecified: Secondary | ICD-10-CM | POA: Insufficient documentation

## 2010-12-10 DIAGNOSIS — C61 Malignant neoplasm of prostate: Secondary | ICD-10-CM | POA: Insufficient documentation

## 2010-12-10 DIAGNOSIS — I10 Essential (primary) hypertension: Secondary | ICD-10-CM | POA: Insufficient documentation

## 2010-12-31 NOTE — Op Note (Signed)
  NAME:  Curtis Hill, DOTSON NO.:  [id number]  MEDICAL RECORD NO.:  [id number]  LOCATION:  DAYL                         FACILITY:  [facility]  PHYSICIAN:  Valetta Fuller, MD    DATE OF BIRTH:  Oct 19, 1945  DATE OF PROCEDURE: DATE OF DISCHARGE:  12/10/2010                              OPERATIVE REPORT   PREOPERATIVE DIAGNOSIS:  Adenocarcinoma of the prostate.  POSTOPERATIVE DIAGNOSIS:  Adenocarcinoma of the prostate.  PROCEDURE PERFORMED: 1. Radioactive prostate seed implantation with Nucleotron robotic     implanter. 2. Flexible cystoscopy.  SURGEON:  Valetta Fuller, MD and Maryln Gottron, M.D.  ANESTHESIA:  General.  INDICATIONS:  Curtis Hill is 65 years of age.  He was originally a patient of Dr. Londell Moh Kimbrough who was subsequent retired from our practice.  The patient was diagnosed with high-risk adenocarcinoma of the prostate.  He was not a candidate for IMRT radiation due to his body habitus.  He was also not felt to be a surgical candidate and decision by Dr. Aldean Ast, Dr. Dayton Scrape, and the patient was to undergo 5 weeks of external beam radiation therapy followed by his seed implantation.  The patient has also been on hormonal therapy.  The patient has completed his external beam radiation therapy and presents now for seed boost. The patient appeared to understand the advantages and disadvantages, potential complications of this treatment.  The patient has received perioperative ciprofloxacin and he has had placement of PAS compression boots.  DESCRIPTION OF PROCEDURE:  Technique advised.  The patient was brought to the operating room where he had successful induction of general anesthesia.  He was placed in lateral lithotomy position and carefully prepped and draped in the usual manner.  Radiation Oncology initiated his case by placing a transrectal ultrasound probe which was anchored to a floor stand.  Anchoring needles were placed in the prostate and  a Foley catheter inserted with contrast in the balloon.  Real time contouring of the prostate, urethra, and rectum were then performed. Real time dose was established.  We were then called to the operating room for placement of the needles.  A second surgical time-out was performed.  Needle passage was done with real time sagittal ultrasound imaging.  A total of 70 needles were placed with a total number of 46 active seeds.  Total dose delivered was 110 Gy.  No complications or problems occurred.  Seed distribution appeared to be satisfactory.  The Foley catheter was removed and flexible cystoscopy revealed a small prostate, which was nonobstructing and no prostate seeds were noted in the bladder or prostatic urethra.  A new catheter was placed which drained clear urine.  The patient was brought to recovery room in stable condition having had no obvious complications or problems.     Valetta Fuller, MD     DSG/MEDQ  D:  12/11/2010  T:  12/11/2010  Job:  [id number]  Electronically Signed by Barron Alvine M.D. on 12/31/2010 09:31:40 AM

## 2011-01-20 LAB — CROSSMATCH
ABO/RH(D): O POS
Antibody Screen: NEGATIVE

## 2011-01-20 LAB — COMPREHENSIVE METABOLIC PANEL
BUN: 14
CO2: 26
CO2: 27
Calcium: 9
Chloride: 99
Creatinine, Ser: 1.04
Creatinine, Ser: 1.14
GFR calc Af Amer: >60
GFR calc non Af Amer: >60
GFR calc non Af Amer: >60
Glucose, Bld: 105 — ABNORMAL HIGH
Glucose, Bld: 109 — ABNORMAL HIGH
Total Bilirubin: 0.8

## 2011-01-20 LAB — DIFFERENTIAL
Basophils Absolute: 0
Basophils Relative: 0
Basophils Relative: 0
Lymphocytes Relative: 13
Lymphocytes Relative: 20
Lymphs Abs: 1.8
Lymphs Abs: 2.3
Monocytes Absolute: 0.8
Monocytes Relative: 7
Neutro Abs: 10.2 — ABNORMAL HIGH
Neutro Abs: 14.7 — ABNORMAL HIGH
Neutro Abs: 8.1 — ABNORMAL HIGH
Neutrophils Relative %: 67
Neutrophils Relative %: 74
Neutrophils Relative %: 80 — ABNORMAL HIGH

## 2011-01-20 LAB — CBC
HCT: 28.7 — ABNORMAL LOW
Hemoglobin: 8.7 — ABNORMAL LOW
Hemoglobin: 9.6 — ABNORMAL LOW
Hemoglobin: 9.8 — ABNORMAL LOW
MCHC: 33.5
MCV: 84.8
MCV: 84.8
Platelets: 417 — ABNORMAL HIGH
Platelets: 456 — ABNORMAL HIGH
RBC: 3.06 — ABNORMAL LOW
RBC: 3.15 — ABNORMAL LOW
RBC: 3.39 — ABNORMAL LOW
RBC: 3.44 — ABNORMAL LOW
RDW: 15.7 — ABNORMAL HIGH
RDW: 16.3 — ABNORMAL HIGH
WBC: 13.8 — ABNORMAL HIGH
WBC: 17.7 — ABNORMAL HIGH

## 2011-01-20 LAB — IRON AND TIBC
Saturation Ratios: 9 — ABNORMAL LOW
TIBC: 178 — ABNORMAL LOW
UIBC: 162

## 2011-01-20 LAB — ABO/RH: ABO/RH(D): O POS

## 2011-01-20 LAB — CULTURE, BLOOD (ROUTINE X 2)

## 2011-01-20 LAB — OCCULT BLOOD X 1 CARD TO LAB, STOOL
Fecal Occult Bld: NEGATIVE
Fecal Occult Bld: NEGATIVE
Fecal Occult Bld: NEGATIVE

## 2011-01-20 LAB — LIPASE, BLOOD: Lipase: 19

## 2011-01-20 LAB — URINALYSIS, ROUTINE W REFLEX MICROSCOPIC
Glucose, UA: NEGATIVE
Ketones, ur: NEGATIVE
Leukocytes, UA: NEGATIVE
pH: 6

## 2011-01-20 LAB — RETICULOCYTES: Retic Ct Pct: 0.6

## 2011-01-20 LAB — URINE CULTURE

## 2011-01-20 LAB — VITAMIN B12: Vitamin B-12: 784 (ref 211–911)

## 2011-01-21 LAB — CULTURE, BLOOD (ROUTINE X 2)
Culture: NO GROWTH
Culture: NO GROWTH

## 2011-01-21 LAB — COMPREHENSIVE METABOLIC PANEL WITH GFR
ALT: 87 — ABNORMAL HIGH
Albumin: 2.9 — ABNORMAL LOW
Alkaline Phosphatase: 128 — ABNORMAL HIGH
BUN: 10
Chloride: 101
Glucose, Bld: 132 — ABNORMAL HIGH
Potassium: 3.7
Sodium: 136
Total Bilirubin: 1.4 — ABNORMAL HIGH
Total Protein: 6.9

## 2011-01-21 LAB — COMPREHENSIVE METABOLIC PANEL
AST: 58 — ABNORMAL HIGH
AST: 62 — ABNORMAL HIGH
AST: 65 — ABNORMAL HIGH
AST: 89 — ABNORMAL HIGH
Albumin: 2.3 — ABNORMAL LOW
Albumin: 2.3 — ABNORMAL LOW
Albumin: 2.5 — ABNORMAL LOW
Alkaline Phosphatase: 192 — ABNORMAL HIGH
BUN: 11
BUN: 12
CO2: 25
CO2: 27
Calcium: 8.6
Calcium: 8.8
Calcium: 9
Chloride: 99
Creatinine, Ser: 1.04
Creatinine, Ser: 1.04
Creatinine, Ser: 1.06
Creatinine, Ser: 1.09
Creatinine, Ser: 1.32
GFR calc Af Amer: >60
GFR calc Af Amer: >60
GFR calc Af Amer: >60
GFR calc Af Amer: >60
GFR calc non Af Amer: >60
GFR calc non Af Amer: >60
Glucose, Bld: 102 — ABNORMAL HIGH
Potassium: 3.6
Total Bilirubin: 1.9 — ABNORMAL HIGH
Total Bilirubin: 2 — ABNORMAL HIGH
Total Protein: 6.4
Total Protein: 6.5

## 2011-01-21 LAB — URINALYSIS, ROUTINE W REFLEX MICROSCOPIC
Glucose, UA: NEGATIVE
Hgb urine dipstick: NEGATIVE
Ketones, ur: 15 — AB
Nitrite: NEGATIVE
Protein, ur: 30 — AB
Specific Gravity, Urine: 1.016
Urobilinogen, UA: 1
pH: 5.5

## 2011-01-21 LAB — CBC
HCT: 29.2 — ABNORMAL LOW
HCT: 29.3 — ABNORMAL LOW
HCT: 29.7 — ABNORMAL LOW
HCT: 32.5 — ABNORMAL LOW
HCT: 32.6 — ABNORMAL LOW
Hemoglobin: 10 — ABNORMAL LOW
Hemoglobin: 10.8 — ABNORMAL LOW
Hemoglobin: 9.7 — ABNORMAL LOW
Hemoglobin: 9.8 — ABNORMAL LOW
MCHC: 33.2
MCHC: 33.2
MCHC: 33.3
MCHC: 33.3
MCHC: 33.4
MCHC: 33.6
MCV: 84.9
MCV: 85.5
MCV: 86.1
MCV: 86.2
MCV: 86.8
MCV: 86.9
MCV: 87.2
Platelets: 320
Platelets: 340
Platelets: 346
Platelets: 378
RBC: 3.35 — ABNORMAL LOW
RBC: 3.47 — ABNORMAL LOW
RBC: 3.75 — ABNORMAL LOW
RBC: 3.75 — ABNORMAL LOW
RDW: 15.1 — ABNORMAL HIGH
RDW: 15.4 — ABNORMAL HIGH
RDW: 15.5 — ABNORMAL HIGH
WBC: 13.9 — ABNORMAL HIGH
WBC: 20.8 — ABNORMAL HIGH
WBC: 24 — ABNORMAL HIGH

## 2011-01-21 LAB — DIFFERENTIAL
Basophils Absolute: 0
Basophils Absolute: 0
Basophils Relative: 0
Basophils Relative: 0
Basophils Relative: 1
Eosinophils Absolute: 0
Eosinophils Absolute: 0.2
Eosinophils Relative: 0
Eosinophils Relative: 2
Lymphocytes Relative: 4 — ABNORMAL LOW
Lymphocytes Relative: 7 — ABNORMAL LOW
Lymphs Abs: 0.8
Lymphs Abs: 3
Monocytes Absolute: 1 — ABNORMAL HIGH
Monocytes Absolute: 1 — ABNORMAL HIGH
Monocytes Absolute: 1 — ABNORMAL HIGH
Monocytes Relative: 5
Monocytes Relative: 6
Monocytes Relative: 7
Neutro Abs: 15.3 — ABNORMAL HIGH
Neutro Abs: 19 — ABNORMAL HIGH
Neutrophils Relative %: 88 — ABNORMAL HIGH
Neutrophils Relative %: 91 — ABNORMAL HIGH

## 2011-01-21 LAB — HEPATIC FUNCTION PANEL
ALT: 121 — ABNORMAL HIGH
AST: 88 — ABNORMAL HIGH
Albumin: 2.3 — ABNORMAL LOW
Alkaline Phosphatase: 157 — ABNORMAL HIGH
Bilirubin, Direct: 0.9 — ABNORMAL HIGH
Total Bilirubin: 1.5 — ABNORMAL HIGH

## 2011-01-21 LAB — HEPATITIS A ANTIBODY, IGM: Hep A IgM: NEGATIVE

## 2011-01-21 LAB — PROTIME-INR: Prothrombin Time: 14.5

## 2011-01-21 LAB — URINE MICROSCOPIC-ADD ON

## 2011-01-21 LAB — BASIC METABOLIC PANEL
CO2: 28
Chloride: 100
Creatinine, Ser: 1.1
GFR calc Af Amer: >60
Potassium: 3.8

## 2011-01-21 LAB — LIPID PANEL
HDL: 11 — ABNORMAL LOW
Triglycerides: 97
VLDL: 19

## 2011-01-21 LAB — AMMONIA: Ammonia: 43 — ABNORMAL HIGH

## 2011-01-21 LAB — IRON AND TIBC
Iron: 41 — ABNORMAL LOW
Saturation Ratios: 19 — ABNORMAL LOW
TIBC: 211 — ABNORMAL LOW
UIBC: 170

## 2011-01-21 LAB — RETICULOCYTES: Retic Ct Pct: 0.5

## 2011-01-21 LAB — HEMOGLOBIN A1C: Hgb A1c MFr Bld: 5.8

## 2011-01-21 LAB — LIPASE, BLOOD: Lipase: 16

## 2011-01-21 LAB — HEPATITIS B CORE ANTIBODY, IGM: Hep B C IgM: NEGATIVE

## 2011-04-15 DIAGNOSIS — H43399 Other vitreous opacities, unspecified eye: Secondary | ICD-10-CM | POA: Diagnosis not present

## 2011-04-15 DIAGNOSIS — H25019 Cortical age-related cataract, unspecified eye: Secondary | ICD-10-CM | POA: Diagnosis not present

## 2011-04-15 DIAGNOSIS — H532 Diplopia: Secondary | ICD-10-CM | POA: Diagnosis not present

## 2011-05-14 DIAGNOSIS — D649 Anemia, unspecified: Secondary | ICD-10-CM | POA: Diagnosis not present

## 2011-05-14 DIAGNOSIS — Z136 Encounter for screening for cardiovascular disorders: Secondary | ICD-10-CM | POA: Diagnosis not present

## 2011-05-14 DIAGNOSIS — Z23 Encounter for immunization: Secondary | ICD-10-CM | POA: Diagnosis not present

## 2011-05-14 DIAGNOSIS — M545 Low back pain, unspecified: Secondary | ICD-10-CM | POA: Diagnosis not present

## 2011-05-14 DIAGNOSIS — Z0000 Encounter for general adult medical examination without abnormal findings: Secondary | ICD-10-CM | POA: Diagnosis not present

## 2011-05-14 DIAGNOSIS — R252 Cramp and spasm: Secondary | ICD-10-CM | POA: Diagnosis not present

## 2011-05-14 DIAGNOSIS — Z131 Encounter for screening for diabetes mellitus: Secondary | ICD-10-CM | POA: Diagnosis not present

## 2011-05-14 DIAGNOSIS — Z79899 Other long term (current) drug therapy: Secondary | ICD-10-CM | POA: Diagnosis not present

## 2011-05-26 DIAGNOSIS — N390 Urinary tract infection, site not specified: Secondary | ICD-10-CM | POA: Diagnosis not present

## 2011-05-26 DIAGNOSIS — R82998 Other abnormal findings in urine: Secondary | ICD-10-CM | POA: Diagnosis not present

## 2011-05-26 DIAGNOSIS — C61 Malignant neoplasm of prostate: Secondary | ICD-10-CM | POA: Diagnosis not present

## 2011-06-11 DIAGNOSIS — Z79899 Other long term (current) drug therapy: Secondary | ICD-10-CM | POA: Diagnosis not present

## 2011-07-20 DIAGNOSIS — D126 Benign neoplasm of colon, unspecified: Secondary | ICD-10-CM | POA: Diagnosis not present

## 2011-07-20 DIAGNOSIS — Z09 Encounter for follow-up examination after completed treatment for conditions other than malignant neoplasm: Secondary | ICD-10-CM | POA: Diagnosis not present

## 2011-07-20 DIAGNOSIS — Z86010 Personal history of colonic polyps: Secondary | ICD-10-CM | POA: Diagnosis not present

## 2011-07-23 DIAGNOSIS — C61 Malignant neoplasm of prostate: Secondary | ICD-10-CM | POA: Diagnosis not present

## 2011-07-30 DIAGNOSIS — C61 Malignant neoplasm of prostate: Secondary | ICD-10-CM | POA: Diagnosis not present

## 2011-11-06 DIAGNOSIS — H532 Diplopia: Secondary | ICD-10-CM | POA: Diagnosis not present

## 2011-11-06 DIAGNOSIS — H25019 Cortical age-related cataract, unspecified eye: Secondary | ICD-10-CM | POA: Diagnosis not present

## 2011-12-29 DIAGNOSIS — Z23 Encounter for immunization: Secondary | ICD-10-CM | POA: Diagnosis not present

## 2011-12-29 DIAGNOSIS — M25559 Pain in unspecified hip: Secondary | ICD-10-CM | POA: Diagnosis not present

## 2012-01-26 DIAGNOSIS — C61 Malignant neoplasm of prostate: Secondary | ICD-10-CM | POA: Diagnosis not present

## 2012-02-02 DIAGNOSIS — N318 Other neuromuscular dysfunction of bladder: Secondary | ICD-10-CM | POA: Diagnosis not present

## 2012-02-02 DIAGNOSIS — C61 Malignant neoplasm of prostate: Secondary | ICD-10-CM | POA: Diagnosis not present

## 2012-02-08 DIAGNOSIS — H2510 Age-related nuclear cataract, unspecified eye: Secondary | ICD-10-CM | POA: Diagnosis not present

## 2012-02-08 DIAGNOSIS — H532 Diplopia: Secondary | ICD-10-CM | POA: Diagnosis not present

## 2012-02-24 DIAGNOSIS — H532 Diplopia: Secondary | ICD-10-CM | POA: Diagnosis not present

## 2012-03-16 DIAGNOSIS — H532 Diplopia: Secondary | ICD-10-CM | POA: Diagnosis not present

## 2012-03-16 DIAGNOSIS — H5000 Unspecified esotropia: Secondary | ICD-10-CM | POA: Diagnosis not present

## 2012-03-16 DIAGNOSIS — H2510 Age-related nuclear cataract, unspecified eye: Secondary | ICD-10-CM | POA: Diagnosis not present

## 2012-03-28 ENCOUNTER — Ambulatory Visit (HOSPITAL_COMMUNITY)
Admission: RE | Admit: 2012-03-28 | Discharge: 2012-03-28 | Disposition: A | Discharge status: Home / Self Care | Payer: Medicare Other | Source: Ambulatory Visit | Attending: Gastroenterology | Admitting: Gastroenterology

## 2012-03-28 ENCOUNTER — Encounter (HOSPITAL_COMMUNITY)
Admission: RE | Disposition: A | Discharge status: Home / Self Care | Payer: Self-pay | Source: Ambulatory Visit | Attending: Gastroenterology

## 2012-03-28 ENCOUNTER — Encounter (HOSPITAL_COMMUNITY): Payer: Self-pay | Admitting: *Deleted

## 2012-03-28 DIAGNOSIS — D126 Benign neoplasm of colon, unspecified: Secondary | ICD-10-CM | POA: Insufficient documentation

## 2012-03-28 DIAGNOSIS — K5730 Diverticulosis of large intestine without perforation or abscess without bleeding: Secondary | ICD-10-CM | POA: Insufficient documentation

## 2012-03-28 DIAGNOSIS — K648 Other hemorrhoids: Secondary | ICD-10-CM | POA: Insufficient documentation

## 2012-03-28 HISTORY — DX: Gastro-esophageal reflux disease without esophagitis: K21.9

## 2012-03-28 HISTORY — DX: Benign neoplasm of colon, unspecified: D12.6

## 2012-03-28 HISTORY — DX: Essential (primary) hypertension: I10

## 2012-03-28 HISTORY — DX: Malignant (primary) neoplasm, unspecified: C80.1

## 2012-03-28 HISTORY — PX: HOT HEMOSTASIS: SHX5433

## 2012-03-28 HISTORY — PX: FLEXIBLE SIGMOIDOSCOPY: SHX5431

## 2012-03-28 HISTORY — DX: Other chronic pain: G89.29

## 2012-03-28 HISTORY — DX: Unspecified osteoarthritis, unspecified site: M19.90

## 2012-03-28 HISTORY — DX: Dorsalgia, unspecified: M54.9

## 2012-03-28 SURGERY — SIGMOIDOSCOPY, FLEXIBLE
Anesthesia: Moderate Sedation

## 2012-03-28 MED ORDER — MIDAZOLAM HCL 10 MG/2ML IJ SOLN
INTRAMUSCULAR | Status: DC | PRN
  Administered 2012-03-28 (×2): 1 mg via INTRAVENOUS
  Administered 2012-03-28: 2 mg via INTRAVENOUS
  Administered 2012-03-28: 1 mg via INTRAVENOUS
  Administered 2012-03-28: 2 mg via INTRAVENOUS

## 2012-03-28 MED ORDER — SODIUM CHLORIDE 0.9 % IV SOLN: INTRAVENOUS | Status: DC

## 2012-03-28 MED ORDER — FENTANYL CITRATE 0.05 MG/ML IJ SOLN
INTRAMUSCULAR | Status: AC
  Filled 2012-03-28: qty 2

## 2012-03-28 MED ORDER — MIDAZOLAM HCL 10 MG/2ML IJ SOLN
INTRAMUSCULAR | Status: AC
  Filled 2012-03-28: qty 2

## 2012-03-28 MED ORDER — FENTANYL CITRATE 0.05 MG/ML IJ SOLN
INTRAMUSCULAR | Status: DC | PRN
  Administered 2012-03-28 (×2): 25 ug via INTRAVENOUS

## 2012-03-28 NOTE — H&P (Signed)
  Date of Initial H&P: 03/28/12  History reviewed, patient examined, no change in status, stable for surgery. Flexible sigmoidoscopy to re-evaluate polypectomy site in descending colon and remove any residual polyp.

## 2012-03-28 NOTE — Op Note (Signed)
[facility] [address] [place], [address]   FLEXIBLE SIGMOIDOSCOPY PROCEDURE REPORT  PATIENT: Curtis, Hill  MR#: [id number] BIRTHDATE: [birth year] , 65  yrs. old GENDER: Male ENDOSCOPIST: Charlott Rakes, MD REFERRED BY: PROCEDURE DATE:  03/28/2012 PROCEDURE:     Flexible sigmoidoscopy with snare cautery ASA CLASS:    Class III INDICATIONS: Colon polyp MEDICATIONS: Fentanyl IV, Versed 7mg  IV  DESCRIPTION OF PROCEDURE:   Rectal exam normal. Colonoscope inserted into a fair prep colon and advanced to the transverse colon. On withdrawal the tattooed site in the descending colon was noted and a 1cm  residual sessile polyp was seen. Snare cautery was used to remove most of this residual polyp. Argon plasma coagulation (APC) was then used at 1 L/min to fulgurate the remaining portion of the polyp. No immediate complications were seen. On further withdrawal a 4 mm semi-sessile polyp was seen in the sigmoid colon and was removed with snare cautery. Diffuses sigmoid diverticuli were noted. Retroflexion revealed small internal hemorrhoids.      COMPLICATIONS:  None  ENDOSCOPIC IMPRESSION: 1. Colon polyps X 2 - s/p removal at stated above 2. Sigmoid diverticulosis 3. Internal hemorrhoids   RECOMMENDATIONS:  1. F/U on path 2. No aspirin products for 2 weeks 3. High fiber diet 4. Repeat colonoscopy in 3 years     _______________________________ eSigned:  Charlott Rakes, MD 03/28/2012 10:01 AM   PATIENT NAME:  Curtis, Hill MR#: [id number]

## 2012-03-28 NOTE — Interval H&P Note (Signed)
History and Physical Interval Note:  03/28/2012 8:34 AM  Curtis Hill  has presented today for surgery, with the diagnosis of polyp  The various methods of treatment have been discussed with the patient and family. After consideration of risks, benefits and other options for treatment, the patient has consented to  Procedure(s) (LRB) with comments: FLEXIBLE SIGMOIDOSCOPY (N/A) HOT HEMOSTASIS (ARGON PLASMA COAGULATION/BICAP) (N/A) as a surgical intervention .  The patient's history has been reviewed, patient examined, no change in status, stable for surgery.  I have reviewed the patient's chart and labs.  Questions were answered to the patient's satisfaction.     Karmen Altamirano C.

## 2012-03-29 ENCOUNTER — Encounter (HOSPITAL_COMMUNITY): Payer: Self-pay | Admitting: Gastroenterology

## 2012-04-21 DIAGNOSIS — H25019 Cortical age-related cataract, unspecified eye: Secondary | ICD-10-CM | POA: Diagnosis not present

## 2012-04-21 DIAGNOSIS — H532 Diplopia: Secondary | ICD-10-CM | POA: Diagnosis not present

## 2012-05-17 DIAGNOSIS — Z23 Encounter for immunization: Secondary | ICD-10-CM | POA: Diagnosis not present

## 2012-05-17 DIAGNOSIS — Z0000 Encounter for general adult medical examination without abnormal findings: Secondary | ICD-10-CM | POA: Diagnosis not present

## 2012-05-17 DIAGNOSIS — L259 Unspecified contact dermatitis, unspecified cause: Secondary | ICD-10-CM | POA: Diagnosis not present

## 2012-05-17 DIAGNOSIS — M109 Gout, unspecified: Secondary | ICD-10-CM | POA: Diagnosis not present

## 2012-05-17 DIAGNOSIS — I10 Essential (primary) hypertension: Secondary | ICD-10-CM | POA: Diagnosis not present

## 2012-06-13 DIAGNOSIS — H532 Diplopia: Secondary | ICD-10-CM | POA: Diagnosis not present

## 2012-06-13 DIAGNOSIS — H25019 Cortical age-related cataract, unspecified eye: Secondary | ICD-10-CM | POA: Diagnosis not present

## 2012-07-12 DIAGNOSIS — H269 Unspecified cataract: Secondary | ICD-10-CM | POA: Diagnosis not present

## 2012-07-12 DIAGNOSIS — H52229 Regular astigmatism, unspecified eye: Secondary | ICD-10-CM | POA: Diagnosis not present

## 2012-07-12 DIAGNOSIS — H2510 Age-related nuclear cataract, unspecified eye: Secondary | ICD-10-CM | POA: Diagnosis not present

## 2012-08-02 DIAGNOSIS — H2510 Age-related nuclear cataract, unspecified eye: Secondary | ICD-10-CM | POA: Diagnosis not present

## 2012-08-03 DIAGNOSIS — C61 Malignant neoplasm of prostate: Secondary | ICD-10-CM | POA: Diagnosis not present

## 2012-08-09 DIAGNOSIS — C61 Malignant neoplasm of prostate: Secondary | ICD-10-CM | POA: Diagnosis not present

## 2012-09-30 DIAGNOSIS — H2510 Age-related nuclear cataract, unspecified eye: Secondary | ICD-10-CM | POA: Diagnosis not present

## 2012-09-30 DIAGNOSIS — H269 Unspecified cataract: Secondary | ICD-10-CM | POA: Diagnosis not present

## 2012-10-20 DIAGNOSIS — M25559 Pain in unspecified hip: Secondary | ICD-10-CM | POA: Diagnosis not present

## 2012-10-27 DIAGNOSIS — M25559 Pain in unspecified hip: Secondary | ICD-10-CM | POA: Diagnosis not present

## 2012-10-27 DIAGNOSIS — M76899 Other specified enthesopathies of unspecified lower limb, excluding foot: Secondary | ICD-10-CM | POA: Diagnosis not present

## 2012-11-11 DIAGNOSIS — K5900 Constipation, unspecified: Secondary | ICD-10-CM | POA: Diagnosis not present

## 2012-11-11 DIAGNOSIS — I10 Essential (primary) hypertension: Secondary | ICD-10-CM | POA: Diagnosis not present

## 2012-12-01 DIAGNOSIS — C61 Malignant neoplasm of prostate: Secondary | ICD-10-CM | POA: Diagnosis not present

## 2012-12-08 DIAGNOSIS — C61 Malignant neoplasm of prostate: Secondary | ICD-10-CM | POA: Diagnosis not present

## 2013-01-10 DIAGNOSIS — Z130 Encounter for screening for diseases of the blood and blood-forming organs and certain disorders involving the immune mechanism: Secondary | ICD-10-CM | POA: Diagnosis not present

## 2013-01-10 DIAGNOSIS — Z1389 Encounter for screening for other disorder: Secondary | ICD-10-CM | POA: Diagnosis not present

## 2013-01-10 DIAGNOSIS — K5900 Constipation, unspecified: Secondary | ICD-10-CM | POA: Diagnosis not present

## 2013-01-10 DIAGNOSIS — Z6841 Body Mass Index (BMI) 40.0 and over, adult: Secondary | ICD-10-CM | POA: Diagnosis not present

## 2013-01-18 DIAGNOSIS — Z23 Encounter for immunization: Secondary | ICD-10-CM | POA: Diagnosis not present

## 2013-01-24 DIAGNOSIS — Z130 Encounter for screening for diseases of the blood and blood-forming organs and certain disorders involving the immune mechanism: Secondary | ICD-10-CM | POA: Diagnosis not present

## 2013-01-24 DIAGNOSIS — Z1389 Encounter for screening for other disorder: Secondary | ICD-10-CM | POA: Diagnosis not present

## 2013-01-24 DIAGNOSIS — R42 Dizziness and giddiness: Secondary | ICD-10-CM | POA: Diagnosis not present

## 2013-01-24 DIAGNOSIS — K5900 Constipation, unspecified: Secondary | ICD-10-CM | POA: Diagnosis not present

## 2013-01-24 DIAGNOSIS — Z6841 Body Mass Index (BMI) 40.0 and over, adult: Secondary | ICD-10-CM | POA: Diagnosis not present

## 2013-02-21 DIAGNOSIS — Z6841 Body Mass Index (BMI) 40.0 and over, adult: Secondary | ICD-10-CM | POA: Diagnosis not present

## 2013-02-21 DIAGNOSIS — K5900 Constipation, unspecified: Secondary | ICD-10-CM | POA: Diagnosis not present

## 2013-03-14 DIAGNOSIS — H43399 Other vitreous opacities, unspecified eye: Secondary | ICD-10-CM | POA: Diagnosis not present

## 2013-03-14 DIAGNOSIS — H26499 Other secondary cataract, unspecified eye: Secondary | ICD-10-CM | POA: Diagnosis not present

## 2013-05-19 DIAGNOSIS — M545 Low back pain, unspecified: Secondary | ICD-10-CM | POA: Diagnosis not present

## 2013-05-19 DIAGNOSIS — M109 Gout, unspecified: Secondary | ICD-10-CM | POA: Diagnosis not present

## 2013-05-19 DIAGNOSIS — Z23 Encounter for immunization: Secondary | ICD-10-CM | POA: Diagnosis not present

## 2013-05-19 DIAGNOSIS — I10 Essential (primary) hypertension: Secondary | ICD-10-CM | POA: Diagnosis not present

## 2013-05-19 DIAGNOSIS — Z0000 Encounter for general adult medical examination without abnormal findings: Secondary | ICD-10-CM | POA: Diagnosis not present

## 2013-06-07 DIAGNOSIS — R05 Cough: Secondary | ICD-10-CM | POA: Diagnosis not present

## 2013-06-07 DIAGNOSIS — R059 Cough, unspecified: Secondary | ICD-10-CM | POA: Diagnosis not present

## 2013-06-12 DIAGNOSIS — C61 Malignant neoplasm of prostate: Secondary | ICD-10-CM | POA: Diagnosis not present

## 2013-06-15 DIAGNOSIS — N318 Other neuromuscular dysfunction of bladder: Secondary | ICD-10-CM | POA: Diagnosis not present

## 2013-06-15 DIAGNOSIS — N400 Enlarged prostate without lower urinary tract symptoms: Secondary | ICD-10-CM | POA: Diagnosis not present

## 2013-06-15 DIAGNOSIS — C61 Malignant neoplasm of prostate: Secondary | ICD-10-CM | POA: Diagnosis not present

## 2013-09-26 DIAGNOSIS — Z23 Encounter for immunization: Secondary | ICD-10-CM | POA: Diagnosis not present

## 2013-09-26 DIAGNOSIS — M25559 Pain in unspecified hip: Secondary | ICD-10-CM | POA: Diagnosis not present

## 2013-09-26 DIAGNOSIS — I10 Essential (primary) hypertension: Secondary | ICD-10-CM | POA: Diagnosis not present

## 2013-12-22 DIAGNOSIS — C61 Malignant neoplasm of prostate: Secondary | ICD-10-CM | POA: Diagnosis not present

## 2013-12-29 DIAGNOSIS — C61 Malignant neoplasm of prostate: Secondary | ICD-10-CM | POA: Diagnosis not present

## 2013-12-29 DIAGNOSIS — N318 Other neuromuscular dysfunction of bladder: Secondary | ICD-10-CM | POA: Diagnosis not present

## 2014-01-26 DIAGNOSIS — J069 Acute upper respiratory infection, unspecified: Secondary | ICD-10-CM | POA: Diagnosis not present

## 2014-01-26 DIAGNOSIS — Z23 Encounter for immunization: Secondary | ICD-10-CM | POA: Diagnosis not present

## 2014-03-13 DIAGNOSIS — H26493 Other secondary cataract, bilateral: Secondary | ICD-10-CM | POA: Diagnosis not present

## 2014-05-03 DIAGNOSIS — S99929A Unspecified injury of unspecified foot, initial encounter: Secondary | ICD-10-CM | POA: Diagnosis not present

## 2014-05-24 DIAGNOSIS — L259 Unspecified contact dermatitis, unspecified cause: Secondary | ICD-10-CM | POA: Diagnosis not present

## 2014-05-24 DIAGNOSIS — B372 Candidiasis of skin and nail: Secondary | ICD-10-CM | POA: Diagnosis not present

## 2014-06-22 DIAGNOSIS — C61 Malignant neoplasm of prostate: Secondary | ICD-10-CM | POA: Diagnosis not present

## 2014-06-29 DIAGNOSIS — C61 Malignant neoplasm of prostate: Secondary | ICD-10-CM | POA: Diagnosis not present

## 2014-09-17 DIAGNOSIS — J069 Acute upper respiratory infection, unspecified: Secondary | ICD-10-CM | POA: Diagnosis not present

## 2014-09-28 DIAGNOSIS — M1990 Unspecified osteoarthritis, unspecified site: Secondary | ICD-10-CM | POA: Diagnosis not present

## 2014-09-28 DIAGNOSIS — Z0000 Encounter for general adult medical examination without abnormal findings: Secondary | ICD-10-CM | POA: Diagnosis not present

## 2014-09-28 DIAGNOSIS — M79673 Pain in unspecified foot: Secondary | ICD-10-CM | POA: Diagnosis not present

## 2014-09-28 DIAGNOSIS — R05 Cough: Secondary | ICD-10-CM | POA: Diagnosis not present

## 2014-09-28 DIAGNOSIS — Z131 Encounter for screening for diabetes mellitus: Secondary | ICD-10-CM | POA: Diagnosis not present

## 2014-09-28 DIAGNOSIS — M109 Gout, unspecified: Secondary | ICD-10-CM | POA: Diagnosis not present

## 2014-09-28 DIAGNOSIS — I10 Essential (primary) hypertension: Secondary | ICD-10-CM | POA: Diagnosis not present

## 2014-10-10 DIAGNOSIS — M722 Plantar fascial fibromatosis: Secondary | ICD-10-CM | POA: Diagnosis not present

## 2014-10-10 DIAGNOSIS — Q663 Other congenital varus deformities of feet: Secondary | ICD-10-CM | POA: Diagnosis not present

## 2014-10-10 DIAGNOSIS — M79671 Pain in right foot: Secondary | ICD-10-CM | POA: Diagnosis not present

## 2014-10-10 DIAGNOSIS — M7751 Other enthesopathy of right foot: Secondary | ICD-10-CM | POA: Diagnosis not present

## 2014-10-24 DIAGNOSIS — M722 Plantar fascial fibromatosis: Secondary | ICD-10-CM | POA: Diagnosis not present

## 2014-10-24 DIAGNOSIS — M65871 Other synovitis and tenosynovitis, right ankle and foot: Secondary | ICD-10-CM | POA: Diagnosis not present

## 2014-10-24 DIAGNOSIS — M25571 Pain in right ankle and joints of right foot: Secondary | ICD-10-CM | POA: Diagnosis not present

## 2014-11-07 DIAGNOSIS — M7751 Other enthesopathy of right foot: Secondary | ICD-10-CM | POA: Diagnosis not present

## 2015-03-04 DIAGNOSIS — Z23 Encounter for immunization: Secondary | ICD-10-CM | POA: Diagnosis not present

## 2015-03-04 DIAGNOSIS — C61 Malignant neoplasm of prostate: Secondary | ICD-10-CM | POA: Diagnosis not present

## 2015-03-11 DIAGNOSIS — N3281 Overactive bladder: Secondary | ICD-10-CM | POA: Diagnosis not present

## 2015-03-11 DIAGNOSIS — Z8546 Personal history of malignant neoplasm of prostate: Secondary | ICD-10-CM | POA: Diagnosis not present

## 2015-04-01 DIAGNOSIS — M1990 Unspecified osteoarthritis, unspecified site: Secondary | ICD-10-CM | POA: Diagnosis not present

## 2015-04-01 DIAGNOSIS — L309 Dermatitis, unspecified: Secondary | ICD-10-CM | POA: Diagnosis not present

## 2015-04-01 DIAGNOSIS — M549 Dorsalgia, unspecified: Secondary | ICD-10-CM | POA: Diagnosis not present

## 2015-04-24 DIAGNOSIS — H26493 Other secondary cataract, bilateral: Secondary | ICD-10-CM | POA: Diagnosis not present

## 2015-04-24 DIAGNOSIS — H5000 Unspecified esotropia: Secondary | ICD-10-CM | POA: Diagnosis not present

## 2015-04-24 DIAGNOSIS — Q142 Congenital malformation of optic disc: Secondary | ICD-10-CM | POA: Diagnosis not present

## 2015-04-24 DIAGNOSIS — H47293 Other optic atrophy, bilateral: Secondary | ICD-10-CM | POA: Diagnosis not present

## 2015-08-09 DIAGNOSIS — Z8546 Personal history of malignant neoplasm of prostate: Secondary | ICD-10-CM | POA: Diagnosis not present

## 2015-08-16 DIAGNOSIS — Z8546 Personal history of malignant neoplasm of prostate: Secondary | ICD-10-CM | POA: Diagnosis not present

## 2015-08-26 DIAGNOSIS — R202 Paresthesia of skin: Secondary | ICD-10-CM | POA: Diagnosis not present

## 2015-08-26 DIAGNOSIS — Z131 Encounter for screening for diabetes mellitus: Secondary | ICD-10-CM | POA: Diagnosis not present

## 2015-08-26 DIAGNOSIS — M7020 Olecranon bursitis, unspecified elbow: Secondary | ICD-10-CM | POA: Diagnosis not present

## 2015-09-07 ENCOUNTER — Emergency Department (HOSPITAL_COMMUNITY)
Admission: EM | Admit: 2015-09-07 | Discharge: 2015-09-08 | Disposition: A | Discharge status: Home / Self Care | Payer: Medicare Other | Attending: Emergency Medicine | Admitting: Emergency Medicine

## 2015-09-07 ENCOUNTER — Encounter (HOSPITAL_COMMUNITY): Payer: Self-pay | Admitting: Emergency Medicine

## 2015-09-07 DIAGNOSIS — Z8546 Personal history of malignant neoplasm of prostate: Secondary | ICD-10-CM | POA: Insufficient documentation

## 2015-09-07 DIAGNOSIS — R000 Tachycardia, unspecified: Secondary | ICD-10-CM | POA: Diagnosis not present

## 2015-09-07 DIAGNOSIS — T7840XA Allergy, unspecified, initial encounter: Secondary | ICD-10-CM | POA: Diagnosis not present

## 2015-09-07 DIAGNOSIS — Z7982 Long term (current) use of aspirin: Secondary | ICD-10-CM | POA: Insufficient documentation

## 2015-09-07 DIAGNOSIS — Z79899 Other long term (current) drug therapy: Secondary | ICD-10-CM | POA: Diagnosis not present

## 2015-09-07 DIAGNOSIS — T781XXA Other adverse food reactions, not elsewhere classified, initial encounter: Secondary | ICD-10-CM | POA: Diagnosis not present

## 2015-09-07 DIAGNOSIS — Z87891 Personal history of nicotine dependence: Secondary | ICD-10-CM | POA: Diagnosis not present

## 2015-09-07 DIAGNOSIS — I10 Essential (primary) hypertension: Secondary | ICD-10-CM | POA: Insufficient documentation

## 2015-09-07 DIAGNOSIS — Z791 Long term (current) use of non-steroidal anti-inflammatories (NSAID): Secondary | ICD-10-CM | POA: Insufficient documentation

## 2015-09-07 LAB — I-STAT CHEM 8, ED
BUN: 30 mg/dL — AB (ref 6–20)
CALCIUM ION: 1.11 mmol/L — AB (ref 1.13–1.30)
CREATININE: 1.1 mg/dL (ref 0.61–1.24)
Chloride: 101 mmol/L (ref 101–111)
Glucose, Bld: 125 mg/dL — ABNORMAL HIGH (ref 65–99)
HEMATOCRIT: 46 % (ref 39.0–52.0)
HEMOGLOBIN: 15.6 g/dL (ref 13.0–17.0)
Potassium: 3.5 mmol/L (ref 3.5–5.1)
SODIUM: 141 mmol/L (ref 135–145)
TCO2: 26 mmol/L (ref 0–100)

## 2015-09-07 MED ORDER — METHYLPREDNISOLONE SODIUM SUCC 125 MG IJ SOLR: 125.0000 mg | Freq: Once | INTRAMUSCULAR | Status: DC

## 2015-09-07 MED ORDER — METHYLPREDNISOLONE SODIUM SUCC 125 MG IJ SOLR
125.0000 mg | Freq: Once | INTRAMUSCULAR | Status: AC
  Administered 2015-09-07: 125 mg via INTRAVENOUS
  Filled 2015-09-07: qty 2

## 2015-09-07 MED ORDER — SODIUM CHLORIDE 0.9 % IV BOLUS (SEPSIS)
1000.0000 mL | Freq: Once | INTRAVENOUS | Status: AC
  Administered 2015-09-07: 1000 mL via INTRAVENOUS

## 2015-09-07 MED ORDER — PREDNISONE 50 MG PO TABS: 50.0000 mg | ORAL_TABLET | Freq: Every day | ORAL | Status: DC

## 2015-09-07 MED ORDER — EPINEPHRINE 0.3 MG/0.3ML IJ SOAJ: 0.3000 mg | Freq: Once | INTRAMUSCULAR | Status: DC

## 2015-09-07 MED ORDER — DIPHENHYDRAMINE HCL 25 MG PO TABS: 25.0000 mg | ORAL_TABLET | Freq: Four times a day (QID) | ORAL | Status: DC

## 2015-09-07 MED ORDER — EPINEPHRINE 0.3 MG/0.3ML IJ SOAJ
0.3000 mg | Freq: Once | INTRAMUSCULAR | Status: AC
  Administered 2015-09-07: 0.3 mg via INTRAMUSCULAR
  Filled 2015-09-07: qty 0.3

## 2015-09-07 MED ORDER — FAMOTIDINE IN NACL 20-0.9 MG/50ML-% IV SOLN
20.0000 mg | Freq: Once | INTRAVENOUS | Status: AC
  Administered 2015-09-07: 20 mg via INTRAVENOUS

## 2015-09-07 MED ORDER — DIPHENHYDRAMINE HCL 50 MG/ML IJ SOLN: 25.0000 mg | Freq: Once | INTRAMUSCULAR | Status: DC

## 2015-09-07 MED ORDER — ALBUTEROL SULFATE (2.5 MG/3ML) 0.083% IN NEBU
5.0000 mg | INHALATION_SOLUTION | RESPIRATORY_TRACT | Status: AC
  Administered 2015-09-07 (×3): 5 mg via RESPIRATORY_TRACT
  Filled 2015-09-07 (×3): qty 6

## 2015-09-07 MED ORDER — FAMOTIDINE IN NACL 20-0.9 MG/50ML-% IV SOLN
20.0000 mg | Freq: Once | INTRAVENOUS | Status: DC
Start: 2015-09-07 — End: 2015-09-07
  Filled 2015-09-07: qty 50
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
## 2015-09-07 NOTE — Discharge Instructions (Signed)

## 2015-09-07 NOTE — ED Notes (Addendum)
Patient presents for allergic reaction, reports no new foods with dinner this evening. C/o facial swelling, bilateral throat swelling, highest oxygen saturation 90% on ra in triage. Able to swallow own secretions, no audible wheezing noted, speaking in complete sentences. Patient also c/o epigastric pain. Took two 25mg  Benadryl and two gas x PTA.

## 2015-09-07 NOTE — ED Notes (Signed)
EKG given to EDP,Knot,MD., for review. 

## 2015-09-12 DIAGNOSIS — M7020 Olecranon bursitis, unspecified elbow: Secondary | ICD-10-CM | POA: Diagnosis not present

## 2015-09-12 DIAGNOSIS — R202 Paresthesia of skin: Secondary | ICD-10-CM | POA: Diagnosis not present

## 2015-09-12 DIAGNOSIS — Z131 Encounter for screening for diabetes mellitus: Secondary | ICD-10-CM | POA: Diagnosis not present

## 2015-10-10 DIAGNOSIS — Z8546 Personal history of malignant neoplasm of prostate: Secondary | ICD-10-CM | POA: Diagnosis not present

## 2015-10-10 DIAGNOSIS — R6889 Other general symptoms and signs: Secondary | ICD-10-CM | POA: Diagnosis not present

## 2015-10-10 DIAGNOSIS — Z6841 Body Mass Index (BMI) 40.0 and over, adult: Secondary | ICD-10-CM | POA: Diagnosis not present

## 2015-10-10 DIAGNOSIS — M109 Gout, unspecified: Secondary | ICD-10-CM | POA: Diagnosis not present

## 2015-10-10 DIAGNOSIS — I10 Essential (primary) hypertension: Secondary | ICD-10-CM | POA: Diagnosis not present

## 2015-10-10 DIAGNOSIS — M1990 Unspecified osteoarthritis, unspecified site: Secondary | ICD-10-CM | POA: Diagnosis not present

## 2015-10-10 DIAGNOSIS — Z0000 Encounter for general adult medical examination without abnormal findings: Secondary | ICD-10-CM | POA: Diagnosis not present

## 2015-10-10 DIAGNOSIS — D485 Neoplasm of uncertain behavior of skin: Secondary | ICD-10-CM | POA: Diagnosis not present

## 2015-11-01 DIAGNOSIS — L03115 Cellulitis of right lower limb: Secondary | ICD-10-CM | POA: Diagnosis not present

## 2015-11-12 DIAGNOSIS — Z85828 Personal history of other malignant neoplasm of skin: Secondary | ICD-10-CM | POA: Diagnosis not present

## 2015-11-12 DIAGNOSIS — D485 Neoplasm of uncertain behavior of skin: Secondary | ICD-10-CM | POA: Diagnosis not present

## 2015-11-12 DIAGNOSIS — L570 Actinic keratosis: Secondary | ICD-10-CM | POA: Diagnosis not present

## 2015-11-12 DIAGNOSIS — D1801 Hemangioma of skin and subcutaneous tissue: Secondary | ICD-10-CM | POA: Diagnosis not present

## 2015-11-12 DIAGNOSIS — L814 Other melanin hyperpigmentation: Secondary | ICD-10-CM | POA: Diagnosis not present

## 2015-11-12 DIAGNOSIS — L218 Other seborrheic dermatitis: Secondary | ICD-10-CM | POA: Diagnosis not present

## 2015-11-12 DIAGNOSIS — Z85820 Personal history of malignant melanoma of skin: Secondary | ICD-10-CM | POA: Diagnosis not present

## 2015-11-12 DIAGNOSIS — D034 Melanoma in situ of scalp and neck: Secondary | ICD-10-CM | POA: Diagnosis not present

## 2015-11-12 DIAGNOSIS — L821 Other seborrheic keratosis: Secondary | ICD-10-CM | POA: Diagnosis not present

## 2015-12-23 DIAGNOSIS — D034 Melanoma in situ of scalp and neck: Secondary | ICD-10-CM | POA: Diagnosis not present

## 2016-02-04 DIAGNOSIS — R05 Cough: Secondary | ICD-10-CM | POA: Diagnosis not present

## 2016-02-04 DIAGNOSIS — Z23 Encounter for immunization: Secondary | ICD-10-CM | POA: Diagnosis not present

## 2016-02-04 DIAGNOSIS — R0982 Postnasal drip: Secondary | ICD-10-CM | POA: Diagnosis not present

## 2016-05-05 DIAGNOSIS — H26493 Other secondary cataract, bilateral: Secondary | ICD-10-CM | POA: Diagnosis not present

## 2016-05-05 DIAGNOSIS — H47293 Other optic atrophy, bilateral: Secondary | ICD-10-CM | POA: Diagnosis not present

## 2016-05-05 DIAGNOSIS — Q142 Congenital malformation of optic disc: Secondary | ICD-10-CM | POA: Diagnosis not present

## 2016-06-03 DIAGNOSIS — L0291 Cutaneous abscess, unspecified: Secondary | ICD-10-CM | POA: Diagnosis not present

## 2016-10-29 DIAGNOSIS — R609 Edema, unspecified: Secondary | ICD-10-CM | POA: Diagnosis not present

## 2016-10-29 DIAGNOSIS — I10 Essential (primary) hypertension: Secondary | ICD-10-CM | POA: Diagnosis not present

## 2016-10-29 DIAGNOSIS — R600 Localized edema: Secondary | ICD-10-CM | POA: Diagnosis not present

## 2016-10-29 DIAGNOSIS — M1990 Unspecified osteoarthritis, unspecified site: Secondary | ICD-10-CM | POA: Diagnosis not present

## 2016-10-29 DIAGNOSIS — J4520 Mild intermittent asthma, uncomplicated: Secondary | ICD-10-CM | POA: Diagnosis not present

## 2016-10-29 DIAGNOSIS — Z0000 Encounter for general adult medical examination without abnormal findings: Secondary | ICD-10-CM | POA: Diagnosis not present

## 2016-10-29 DIAGNOSIS — Z13220 Encounter for screening for lipoid disorders: Secondary | ICD-10-CM | POA: Diagnosis not present

## 2016-10-29 DIAGNOSIS — Z6841 Body Mass Index (BMI) 40.0 and over, adult: Secondary | ICD-10-CM | POA: Diagnosis not present

## 2016-10-29 DIAGNOSIS — M109 Gout, unspecified: Secondary | ICD-10-CM | POA: Diagnosis not present

## 2016-10-29 DIAGNOSIS — R7301 Impaired fasting glucose: Secondary | ICD-10-CM | POA: Diagnosis not present

## 2016-11-26 DIAGNOSIS — L814 Other melanin hyperpigmentation: Secondary | ICD-10-CM | POA: Diagnosis not present

## 2016-11-26 DIAGNOSIS — L821 Other seborrheic keratosis: Secondary | ICD-10-CM | POA: Diagnosis not present

## 2016-11-26 DIAGNOSIS — D1801 Hemangioma of skin and subcutaneous tissue: Secondary | ICD-10-CM | POA: Diagnosis not present

## 2016-11-26 DIAGNOSIS — L570 Actinic keratosis: Secondary | ICD-10-CM | POA: Diagnosis not present

## 2016-11-26 DIAGNOSIS — Z85820 Personal history of malignant melanoma of skin: Secondary | ICD-10-CM | POA: Diagnosis not present

## 2016-11-26 DIAGNOSIS — Z85828 Personal history of other malignant neoplasm of skin: Secondary | ICD-10-CM | POA: Diagnosis not present

## 2017-01-13 DIAGNOSIS — Z8546 Personal history of malignant neoplasm of prostate: Secondary | ICD-10-CM | POA: Diagnosis not present

## 2017-01-13 DIAGNOSIS — N3281 Overactive bladder: Secondary | ICD-10-CM | POA: Diagnosis not present

## 2017-03-14 DIAGNOSIS — Z23 Encounter for immunization: Secondary | ICD-10-CM | POA: Diagnosis not present

## 2017-05-04 DIAGNOSIS — Z6841 Body Mass Index (BMI) 40.0 and over, adult: Secondary | ICD-10-CM | POA: Diagnosis not present

## 2017-05-04 DIAGNOSIS — L919 Hypertrophic disorder of the skin, unspecified: Secondary | ICD-10-CM | POA: Diagnosis not present

## 2017-05-04 DIAGNOSIS — Z7409 Other reduced mobility: Secondary | ICD-10-CM | POA: Diagnosis not present

## 2017-05-04 DIAGNOSIS — L987 Excessive and redundant skin and subcutaneous tissue: Secondary | ICD-10-CM | POA: Diagnosis not present

## 2017-05-04 DIAGNOSIS — M549 Dorsalgia, unspecified: Secondary | ICD-10-CM | POA: Diagnosis not present

## 2017-05-07 ENCOUNTER — Other Ambulatory Visit: Payer: Self-pay | Admitting: Plastic Surgery

## 2017-05-11 ENCOUNTER — Other Ambulatory Visit: Payer: Self-pay | Admitting: Plastic Surgery

## 2017-05-11 DIAGNOSIS — H5000 Unspecified esotropia: Secondary | ICD-10-CM | POA: Diagnosis not present

## 2017-05-11 DIAGNOSIS — M793 Panniculitis, unspecified: Secondary | ICD-10-CM

## 2017-05-11 DIAGNOSIS — Q142 Congenital malformation of optic disc: Secondary | ICD-10-CM | POA: Diagnosis not present

## 2017-05-18 ENCOUNTER — Ambulatory Visit
Admission: RE | Admit: 2017-05-18 | Discharge: 2017-05-18 | Disposition: A | Discharge status: Home / Self Care | Payer: Medicare Other | Source: Ambulatory Visit | Attending: Plastic Surgery | Admitting: Plastic Surgery

## 2017-05-18 DIAGNOSIS — N281 Cyst of kidney, acquired: Secondary | ICD-10-CM | POA: Diagnosis not present

## 2017-05-18 DIAGNOSIS — M793 Panniculitis, unspecified: Secondary | ICD-10-CM

## 2017-05-18 MED ORDER — IOPAMIDOL (ISOVUE-300) INJECTION 61%
150.0000 mL | Freq: Once | INTRAVENOUS | Status: AC | PRN
  Administered 2017-05-18: 150 mL via INTRAVENOUS

## 2017-07-12 DIAGNOSIS — Z85820 Personal history of malignant melanoma of skin: Secondary | ICD-10-CM | POA: Diagnosis not present

## 2017-07-12 DIAGNOSIS — L821 Other seborrheic keratosis: Secondary | ICD-10-CM | POA: Diagnosis not present

## 2017-08-26 DIAGNOSIS — R739 Hyperglycemia, unspecified: Secondary | ICD-10-CM | POA: Diagnosis not present

## 2017-08-26 DIAGNOSIS — R635 Abnormal weight gain: Secondary | ICD-10-CM | POA: Diagnosis not present

## 2017-08-26 DIAGNOSIS — E786 Lipoprotein deficiency: Secondary | ICD-10-CM | POA: Diagnosis not present

## 2017-08-26 DIAGNOSIS — Z6841 Body Mass Index (BMI) 40.0 and over, adult: Secondary | ICD-10-CM | POA: Diagnosis not present

## 2017-09-09 DIAGNOSIS — Z6841 Body Mass Index (BMI) 40.0 and over, adult: Secondary | ICD-10-CM | POA: Diagnosis not present

## 2017-09-09 DIAGNOSIS — I10 Essential (primary) hypertension: Secondary | ICD-10-CM | POA: Diagnosis not present

## 2017-09-09 DIAGNOSIS — R739 Hyperglycemia, unspecified: Secondary | ICD-10-CM | POA: Diagnosis not present

## 2017-09-09 DIAGNOSIS — E786 Lipoprotein deficiency: Secondary | ICD-10-CM | POA: Diagnosis not present

## 2017-09-09 DIAGNOSIS — D509 Iron deficiency anemia, unspecified: Secondary | ICD-10-CM | POA: Diagnosis not present

## 2017-09-22 DIAGNOSIS — Z6841 Body Mass Index (BMI) 40.0 and over, adult: Secondary | ICD-10-CM | POA: Diagnosis not present

## 2017-09-22 DIAGNOSIS — Z713 Dietary counseling and surveillance: Secondary | ICD-10-CM | POA: Diagnosis not present

## 2017-10-07 DIAGNOSIS — I10 Essential (primary) hypertension: Secondary | ICD-10-CM | POA: Diagnosis not present

## 2017-10-07 DIAGNOSIS — Z6841 Body Mass Index (BMI) 40.0 and over, adult: Secondary | ICD-10-CM | POA: Diagnosis not present

## 2017-10-07 DIAGNOSIS — D509 Iron deficiency anemia, unspecified: Secondary | ICD-10-CM | POA: Diagnosis not present

## 2017-10-18 DIAGNOSIS — M19071 Primary osteoarthritis, right ankle and foot: Secondary | ICD-10-CM | POA: Diagnosis not present

## 2017-10-25 ENCOUNTER — Emergency Department (HOSPITAL_COMMUNITY): Payer: Medicare Other

## 2017-10-25 ENCOUNTER — Encounter (HOSPITAL_COMMUNITY): Payer: Self-pay | Admitting: Emergency Medicine

## 2017-10-25 ENCOUNTER — Emergency Department (HOSPITAL_COMMUNITY)
Admission: EM | Admit: 2017-10-25 | Discharge: 2017-10-25 | Disposition: A | Discharge status: Home / Self Care | Payer: Medicare Other | Attending: Emergency Medicine | Admitting: Emergency Medicine

## 2017-10-25 DIAGNOSIS — R509 Fever, unspecified: Secondary | ICD-10-CM | POA: Diagnosis not present

## 2017-10-25 DIAGNOSIS — Z7982 Long term (current) use of aspirin: Secondary | ICD-10-CM | POA: Insufficient documentation

## 2017-10-25 DIAGNOSIS — Z87891 Personal history of nicotine dependence: Secondary | ICD-10-CM | POA: Insufficient documentation

## 2017-10-25 DIAGNOSIS — I10 Essential (primary) hypertension: Secondary | ICD-10-CM | POA: Diagnosis not present

## 2017-10-25 DIAGNOSIS — R05 Cough: Secondary | ICD-10-CM | POA: Diagnosis not present

## 2017-10-25 DIAGNOSIS — Z8546 Personal history of malignant neoplasm of prostate: Secondary | ICD-10-CM | POA: Diagnosis not present

## 2017-10-25 DIAGNOSIS — R0602 Shortness of breath: Secondary | ICD-10-CM | POA: Diagnosis not present

## 2017-10-25 DIAGNOSIS — Z79899 Other long term (current) drug therapy: Secondary | ICD-10-CM | POA: Insufficient documentation

## 2017-10-25 DIAGNOSIS — R059 Cough, unspecified: Secondary | ICD-10-CM

## 2017-10-25 LAB — I-STAT TROPONIN, ED: Troponin i, poc: 0.01 ng/mL (ref 0.00–0.08)

## 2017-10-25 LAB — CBC WITH DIFFERENTIAL/PLATELET
ABS IMMATURE GRANULOCYTES: 0.1 10*3/uL (ref 0.0–0.1)
BASOS PCT: 0 %
Basophils Absolute: 0.1 10*3/uL (ref 0.0–0.1)
EOS ABS: 0.1 10*3/uL (ref 0.0–0.7)
EOS PCT: 0 %
HCT: 42.3 % (ref 39.0–52.0)
Hemoglobin: 13.1 g/dL (ref 13.0–17.0)
Immature Granulocytes: 0 %
Lymphocytes Relative: 5 %
Lymphs Abs: 1 10*3/uL (ref 0.7–4.0)
MCH: 27.3 pg (ref 26.0–34.0)
MCHC: 31 g/dL (ref 30.0–36.0)
MCV: 88.3 fL (ref 78.0–100.0)
Monocytes Absolute: 1.1 10*3/uL — ABNORMAL HIGH (ref 0.1–1.0)
Monocytes Relative: 6 %
NEUTROS PCT: 89 %
Neutro Abs: 17 10*3/uL — ABNORMAL HIGH (ref 1.7–7.7)
PLATELETS: 253 10*3/uL (ref 150–400)
RBC: 4.79 MIL/uL (ref 4.22–5.81)
RDW: 14.9 % (ref 11.5–15.5)
WBC: 19.3 10*3/uL — AB (ref 4.0–10.5)

## 2017-10-25 LAB — BRAIN NATRIURETIC PEPTIDE: B Natriuretic Peptide: 69.9 pg/mL (ref 0.0–100.0)

## 2017-10-25 LAB — COMPREHENSIVE METABOLIC PANEL
ALK PHOS: 53 U/L (ref 38–126)
ALT: 30 U/L (ref 0–44)
AST: 32 U/L (ref 15–41)
Albumin: 4.1 g/dL (ref 3.5–5.0)
Anion gap: 11 (ref 5–15)
BILIRUBIN TOTAL: 1 mg/dL (ref 0.3–1.2)
BUN: 17 mg/dL (ref 8–23)
CALCIUM: 9.5 mg/dL (ref 8.9–10.3)
CO2: 27 mmol/L (ref 22–32)
CREATININE: 0.93 mg/dL (ref 0.61–1.24)
Chloride: 102 mmol/L (ref 98–111)
Glucose, Bld: 110 mg/dL — ABNORMAL HIGH (ref 70–99)
Potassium: 3.6 mmol/L (ref 3.5–5.1)
Sodium: 140 mmol/L (ref 135–145)
Total Protein: 7.8 g/dL (ref 6.5–8.1)

## 2017-10-25 LAB — I-STAT CG4 LACTIC ACID, ED: Lactic Acid, Venous: 1.09 mmol/L (ref 0.5–1.9)

## 2017-10-25 MED ORDER — IOPAMIDOL (ISOVUE-370) INJECTION 76%
100.0000 mL | Freq: Once | INTRAVENOUS | Status: AC | PRN
  Administered 2017-10-25: 100 mL via INTRAVENOUS

## 2017-10-25 MED ORDER — IOPAMIDOL (ISOVUE-370) INJECTION 76%
INTRAVENOUS | Status: AC
  Filled 2017-10-25: qty 100

## 2017-10-25 MED ORDER — ACETAMINOPHEN 500 MG PO TABS
1000.0000 mg | ORAL_TABLET | Freq: Once | ORAL | Status: AC
  Administered 2017-10-25: 1000 mg via ORAL
  Filled 2017-10-25: qty 2

## 2017-10-25 MED ORDER — HYDROCOD POLST-CPM POLST ER 10-8 MG/5ML PO SUER: 5.0000 mL | Freq: Every evening | ORAL | 0 refills | Status: DC | PRN

## 2017-10-25 MED ORDER — SODIUM CHLORIDE 0.9 % IV BOLUS
1000.0000 mL | Freq: Once | INTRAVENOUS | Status: AC
  Administered 2017-10-25: 1000 mL via INTRAVENOUS

## 2017-10-25 MED ORDER — HYDROCOD POLST-CPM POLST ER 10-8 MG/5ML PO SUER
5.0000 mL | Freq: Once | ORAL | Status: AC
  Administered 2017-10-25: 5 mL via ORAL
  Filled 2017-10-25: qty 5

## 2017-10-25 NOTE — Discharge Instructions (Addendum)
Take tylenol 2 pills 4 times a day.  Drink plenty of fluids.  Return for worsening shortness of breath, headache, confusion. Follow up with your family doctor.   The CT scan showed incidental findings of suggesting pulmonary hypertension.    It also showed an incidental aneurysm that should be rechecked every 6-12 months

## 2017-10-25 NOTE — ED Triage Notes (Signed)
Pt sitting chair because he does not feel good in bed.

## 2017-10-25 NOTE — ED Triage Notes (Signed)
IV team at bed side to start IV  For CT
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
## 2017-10-25 NOTE — ED Triage Notes (Signed)
Attempts by 2 different Rn's to start IV . Unable to access vein for IV start. Consult for IV team placed.

## 2017-10-25 NOTE — ED Provider Notes (Signed)
Delta troponin is negative.  CT scan does not show a pulmonary embolism.  Incidental aneurysm and pulmonary hypertension noted on the CT scan.  Discussed outpatient follow-up with primary care doctor.   Dorie Rank, MD 10/25/17 [phone]

## 2017-10-25 NOTE — ED Triage Notes (Signed)
Patient sent from PCP for concern of viral pneumonia. Reports SpO2 88% in office on room air, SpO2 95% on air during triage. Patient alert, oriented, and in no apparent distress at this time.

## 2017-11-17 DIAGNOSIS — I10 Essential (primary) hypertension: Secondary | ICD-10-CM | POA: Diagnosis not present

## 2017-11-17 DIAGNOSIS — Z713 Dietary counseling and surveillance: Secondary | ICD-10-CM | POA: Diagnosis not present

## 2017-11-17 DIAGNOSIS — Z6841 Body Mass Index (BMI) 40.0 and over, adult: Secondary | ICD-10-CM | POA: Diagnosis not present

## 2017-11-17 DIAGNOSIS — E669 Obesity, unspecified: Secondary | ICD-10-CM | POA: Diagnosis not present

## 2017-12-02 DIAGNOSIS — Z8546 Personal history of malignant neoplasm of prostate: Secondary | ICD-10-CM | POA: Diagnosis not present

## 2017-12-02 DIAGNOSIS — M109 Gout, unspecified: Secondary | ICD-10-CM | POA: Diagnosis not present

## 2017-12-02 DIAGNOSIS — I10 Essential (primary) hypertension: Secondary | ICD-10-CM | POA: Diagnosis not present

## 2017-12-02 DIAGNOSIS — R7301 Impaired fasting glucose: Secondary | ICD-10-CM | POA: Diagnosis not present

## 2017-12-06 DIAGNOSIS — Z713 Dietary counseling and surveillance: Secondary | ICD-10-CM | POA: Diagnosis not present

## 2017-12-06 DIAGNOSIS — Z6841 Body Mass Index (BMI) 40.0 and over, adult: Secondary | ICD-10-CM | POA: Diagnosis not present

## 2017-12-07 DIAGNOSIS — I2510 Atherosclerotic heart disease of native coronary artery without angina pectoris: Secondary | ICD-10-CM | POA: Diagnosis not present

## 2017-12-07 DIAGNOSIS — R799 Abnormal finding of blood chemistry, unspecified: Secondary | ICD-10-CM | POA: Diagnosis not present

## 2017-12-07 DIAGNOSIS — I10 Essential (primary) hypertension: Secondary | ICD-10-CM | POA: Diagnosis not present

## 2017-12-07 DIAGNOSIS — I700 Atherosclerosis of aorta: Secondary | ICD-10-CM | POA: Diagnosis not present

## 2017-12-07 DIAGNOSIS — M2550 Pain in unspecified joint: Secondary | ICD-10-CM | POA: Diagnosis not present

## 2017-12-07 DIAGNOSIS — M109 Gout, unspecified: Secondary | ICD-10-CM | POA: Diagnosis not present

## 2017-12-07 DIAGNOSIS — I712 Thoracic aortic aneurysm, without rupture: Secondary | ICD-10-CM | POA: Diagnosis not present

## 2017-12-07 DIAGNOSIS — Z0000 Encounter for general adult medical examination without abnormal findings: Secondary | ICD-10-CM | POA: Diagnosis not present

## 2017-12-07 DIAGNOSIS — D649 Anemia, unspecified: Secondary | ICD-10-CM | POA: Diagnosis not present

## 2017-12-14 DIAGNOSIS — Z6841 Body Mass Index (BMI) 40.0 and over, adult: Secondary | ICD-10-CM | POA: Diagnosis not present

## 2017-12-14 DIAGNOSIS — I719 Aortic aneurysm of unspecified site, without rupture: Secondary | ICD-10-CM | POA: Diagnosis not present

## 2017-12-14 DIAGNOSIS — R739 Hyperglycemia, unspecified: Secondary | ICD-10-CM | POA: Diagnosis not present

## 2017-12-14 DIAGNOSIS — R609 Edema, unspecified: Secondary | ICD-10-CM | POA: Diagnosis not present

## 2017-12-14 DIAGNOSIS — E786 Lipoprotein deficiency: Secondary | ICD-10-CM | POA: Diagnosis not present

## 2017-12-14 DIAGNOSIS — D509 Iron deficiency anemia, unspecified: Secondary | ICD-10-CM | POA: Diagnosis not present

## 2017-12-14 DIAGNOSIS — I10 Essential (primary) hypertension: Secondary | ICD-10-CM | POA: Diagnosis not present

## 2017-12-20 DIAGNOSIS — Z86010 Personal history of colonic polyps: Secondary | ICD-10-CM | POA: Diagnosis not present

## 2017-12-20 DIAGNOSIS — K219 Gastro-esophageal reflux disease without esophagitis: Secondary | ICD-10-CM | POA: Diagnosis not present

## 2017-12-27 DIAGNOSIS — Z713 Dietary counseling and surveillance: Secondary | ICD-10-CM | POA: Diagnosis not present

## 2017-12-27 DIAGNOSIS — Z6841 Body Mass Index (BMI) 40.0 and over, adult: Secondary | ICD-10-CM | POA: Diagnosis not present

## 2018-01-17 DIAGNOSIS — Z713 Dietary counseling and surveillance: Secondary | ICD-10-CM | POA: Diagnosis not present

## 2018-01-17 DIAGNOSIS — Z6841 Body Mass Index (BMI) 40.0 and over, adult: Secondary | ICD-10-CM | POA: Diagnosis not present

## 2018-01-19 ENCOUNTER — Other Ambulatory Visit: Payer: Self-pay

## 2018-01-19 ENCOUNTER — Encounter: Payer: Self-pay | Admitting: *Deleted

## 2018-01-19 ENCOUNTER — Institutional Professional Consult (permissible substitution) (INDEPENDENT_AMBULATORY_CARE_PROVIDER_SITE_OTHER): Payer: Medicare Other | Admitting: Cardiothoracic Surgery

## 2018-01-19 ENCOUNTER — Encounter: Payer: Self-pay | Admitting: Cardiothoracic Surgery

## 2018-01-19 VITALS — BP 123/75 | HR 80 | Resp 16 | Ht 70.0 in | Wt 398.0 lb

## 2018-01-19 DIAGNOSIS — I712 Thoracic aortic aneurysm, without rupture, unspecified: Secondary | ICD-10-CM
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
## 2018-01-20 DIAGNOSIS — C61 Malignant neoplasm of prostate: Secondary | ICD-10-CM | POA: Diagnosis not present

## 2018-02-01 DIAGNOSIS — I719 Aortic aneurysm of unspecified site, without rupture: Secondary | ICD-10-CM | POA: Diagnosis not present

## 2018-02-01 DIAGNOSIS — R739 Hyperglycemia, unspecified: Secondary | ICD-10-CM | POA: Diagnosis not present

## 2018-02-01 DIAGNOSIS — D509 Iron deficiency anemia, unspecified: Secondary | ICD-10-CM | POA: Diagnosis not present

## 2018-02-01 DIAGNOSIS — Z6841 Body Mass Index (BMI) 40.0 and over, adult: Secondary | ICD-10-CM | POA: Diagnosis not present

## 2018-02-01 DIAGNOSIS — I10 Essential (primary) hypertension: Secondary | ICD-10-CM | POA: Diagnosis not present

## 2018-02-01 DIAGNOSIS — Z713 Dietary counseling and surveillance: Secondary | ICD-10-CM | POA: Diagnosis not present

## 2018-02-01 DIAGNOSIS — R635 Abnormal weight gain: Secondary | ICD-10-CM | POA: Diagnosis not present

## 2018-02-03 DIAGNOSIS — Z23 Encounter for immunization: Secondary | ICD-10-CM | POA: Diagnosis not present

## 2018-02-28 ENCOUNTER — Ambulatory Visit: Payer: Medicare Other | Admitting: Cardiology

## 2018-03-16 DIAGNOSIS — R739 Hyperglycemia, unspecified: Secondary | ICD-10-CM | POA: Diagnosis not present

## 2018-03-16 DIAGNOSIS — I10 Essential (primary) hypertension: Secondary | ICD-10-CM | POA: Diagnosis not present

## 2018-03-16 DIAGNOSIS — R635 Abnormal weight gain: Secondary | ICD-10-CM | POA: Diagnosis not present

## 2018-03-16 DIAGNOSIS — Z713 Dietary counseling and surveillance: Secondary | ICD-10-CM | POA: Diagnosis not present

## 2018-03-16 DIAGNOSIS — I719 Aortic aneurysm of unspecified site, without rupture: Secondary | ICD-10-CM | POA: Diagnosis not present

## 2018-03-16 DIAGNOSIS — Z6841 Body Mass Index (BMI) 40.0 and over, adult: Secondary | ICD-10-CM | POA: Diagnosis not present

## 2018-03-17 ENCOUNTER — Encounter: Payer: Self-pay | Admitting: Cardiology

## 2018-03-17 ENCOUNTER — Ambulatory Visit (INDEPENDENT_AMBULATORY_CARE_PROVIDER_SITE_OTHER): Payer: Medicare Other | Admitting: Cardiology

## 2018-03-17 DIAGNOSIS — I712 Thoracic aortic aneurysm, without rupture, unspecified: Secondary | ICD-10-CM | POA: Insufficient documentation

## 2018-03-17 DIAGNOSIS — I10 Essential (primary) hypertension: Secondary | ICD-10-CM | POA: Diagnosis not present

## 2018-03-17 DIAGNOSIS — I2510 Atherosclerotic heart disease of native coronary artery without angina pectoris: Secondary | ICD-10-CM

## 2018-03-17 HISTORY — DX: Atherosclerotic heart disease of native coronary artery without angina pectoris: I25.10

## 2018-03-17 HISTORY — DX: Thoracic aortic aneurysm, without rupture: I71.2

## 2018-03-17 HISTORY — DX: Thoracic aortic aneurysm, without rupture, unspecified: I71.20

## 2018-03-17 NOTE — Addendum Note (Signed)
Addended by: Sarina Ill on: 03/17/2018 02:44 PM   Modules accepted: Orders

## 2018-03-17 NOTE — Patient Instructions (Addendum)
Medication Instructions:  Your physician recommends that you continue on your current medications as directed. Please refer to the Current Medication list given to you today.  If you need a refill on your cardiac medications before your next appointment, please call your pharmacy.   Lab work: Today: LFT and Lipids  If you have labs (blood work) drawn today and your tests are completely normal, you will receive your results only by: Marland Kitchen MyChart Message (if you have MyChart) OR . A paper copy in the mail If you have any lab test that is abnormal or we need to change your treatment, we will call you to review the results.  Testing/Procedures: Your physician has requested that you have a lexiscan myoview. For further information please visit [email or web address]. Please follow instruction sheet, as given.  Schedule Cardiac Scoring  Your physician has requested that you have an echocardiogram. Echocardiography is a painless test that uses sound waves to create images of your heart. It provides your doctor with information about the size and shape of your heart and how well your heart's chambers and valves are working. This procedure takes approximately one hour. There are no restrictions for this procedure.  Follow-Up: Your physician wants you to follow-up in: 1 year with Dr. Radford Pax. You will receive a reminder letter in the mail two months in advance. If you don't receive a letter, please call our office to schedule the follow-up appointment.
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
## 2018-03-18 LAB — LIPID PANEL
CHOL/HDL RATIO: 2.4 ratio (ref 0.0–5.0)
CHOLESTEROL TOTAL: 151 mg/dL (ref 100–199)
HDL: 63 mg/dL (ref >39)
LDL Calculated: 73 mg/dL (ref 0–99)
Triglycerides: 75 mg/dL (ref 0–149)
VLDL Cholesterol Cal: 15 mg/dL (ref 5–40)

## 2018-03-18 LAB — HEPATIC FUNCTION PANEL
ALK PHOS: 63 IU/L (ref 39–117)
ALT: 24 IU/L (ref 0–44)
AST: 21 IU/L (ref 0–40)
Albumin: 4.4 g/dL (ref 3.5–4.8)
Bilirubin Total: 0.5 mg/dL (ref 0.0–1.2)
Bilirubin, Direct: 0.19 mg/dL (ref 0.00–0.40)
Total Protein: 7.1 g/dL (ref 6.0–8.5)

## 2018-03-25 ENCOUNTER — Telehealth: Payer: Self-pay | Admitting: Cardiology

## 2018-03-25 NOTE — Telephone Encounter (Signed)
Pt is for stress test after Christmas the 26. And 27th will not have those results to clear until after the 28th.  Will give recommendations at that time.

## 2018-03-25 NOTE — Telephone Encounter (Signed)
New Message      [facility] Pre-operative Risk Assessment    Request for surgical clearance:  1. What type of surgery is being performed? Colonoscopy   2. When is this surgery scheduled? TBD  3. What type of clearance is required (medical clearance vs. Pharmacy clearance to hold med vs. Both)? Medical   4. Are there any medications that need to be held prior to surgery and how long?no    5. Practice name and name of physician performing surgery? [facility] Dr. Wilford Corner   6. What is your office phone number [phone]    7.   What is your office fax number [phone]  8.   Anesthesia type (None, local, MAC, general) ? propofol    Curtis Hill 03/25/2018, 11:56 AM  _________________________________________________________________   (provider comments below)

## 2018-03-30 NOTE — Telephone Encounter (Signed)
Callback, please find out how urgent procedure is or when this is planned for. If necessary to proceed may need to clear prior to above testing since it's a relatively low risk procedure. Alvey Brockel PA-C

## 2018-03-30 NOTE — Telephone Encounter (Signed)
Called [facility] re: surgical clearance below. Per Lenna Sciara, this procedure is not urgent, and Dr. Michail Sermon has already let the pt know that he would not do the colonoscopy until he was cleared. She stated it can wait.

## 2018-04-04 ENCOUNTER — Ambulatory Visit (HOSPITAL_COMMUNITY): Payer: Medicare Other | Attending: Cardiovascular Disease

## 2018-04-04 ENCOUNTER — Other Ambulatory Visit: Payer: Self-pay

## 2018-04-04 DIAGNOSIS — I712 Thoracic aortic aneurysm, without rupture, unspecified: Secondary | ICD-10-CM

## 2018-04-05 ENCOUNTER — Telehealth (HOSPITAL_COMMUNITY): Payer: Self-pay

## 2018-04-05 NOTE — Telephone Encounter (Signed)
Spoke with the patient, instructions given, and pt stated that he understood and would be here for his test. S.Curtis Hill EMTP

## 2018-04-07 ENCOUNTER — Ambulatory Visit (HOSPITAL_COMMUNITY): Payer: Medicare Other | Attending: Cardiovascular Disease

## 2018-04-07 ENCOUNTER — Telehealth: Payer: Self-pay

## 2018-04-07 ENCOUNTER — Encounter (HOSPITAL_COMMUNITY): Payer: Medicare Other

## 2018-04-07 ENCOUNTER — Ambulatory Visit (INDEPENDENT_AMBULATORY_CARE_PROVIDER_SITE_OTHER)
Admission: RE | Admit: 2018-04-07 | Discharge: 2018-04-07 | Disposition: A | Discharge status: Home / Self Care | Payer: Self-pay | Source: Ambulatory Visit | Attending: Cardiology | Admitting: Cardiology

## 2018-04-07 DIAGNOSIS — I2510 Atherosclerotic heart disease of native coronary artery without angina pectoris: Secondary | ICD-10-CM

## 2018-04-07 DIAGNOSIS — I712 Thoracic aortic aneurysm, without rupture, unspecified: Secondary | ICD-10-CM

## 2018-04-07 MED ORDER — REGADENOSON 0.4 MG/5ML IV SOLN
0.4000 mg | Freq: Once | INTRAVENOUS | Status: AC
Start: 2018-04-07 — End: 2018-04-07
  Administered 2018-04-07: 0.4 mg via INTRAVENOUS

## 2018-04-07 MED ORDER — TECHNETIUM TC 99M TETROFOSMIN IV KIT
31.7000 | PACK | Freq: Once | INTRAVENOUS | Status: AC | PRN
  Administered 2018-04-07: 31.7 via INTRAVENOUS
  Filled 2018-04-07: qty 32

## 2018-04-07 NOTE — Telephone Encounter (Signed)
Notes recorded by Sueanne Margarita, MD on 04/04/2018 at 3:40 PM EST Aortic aneurysm appears stable from chest Ct 10/2017 - please get cardiac MRI/MRA angio 10/2017 to reassess aortic aneurysm ------  Notes recorded by Sueanne Margarita, MD on 04/04/2018 at 3:38 PM EST Please let patient know that echo showed normal LVF with mildly thickened heart muscle, and increased stiffness of heart muscle. There is mild AS and moderately dilated ascending aorta.

## 2018-04-07 NOTE — Telephone Encounter (Signed)
Spoke with the patient, he expressed understanding about having a repeat MRA around 10/2018.

## 2018-04-07 NOTE — Telephone Encounter (Signed)
-----   Message from Frederik Schmidt, RN sent at 04/07/2018 10:52 AM EST -----  ----- Message ----- From: Dorothy Spark, MD Sent: 04/07/2018  10:15 AM EST To: [facility] Triage  Yes please order CTA or MRA in 10/2018

## 2018-04-08 ENCOUNTER — Ambulatory Visit (HOSPITAL_COMMUNITY): Payer: Medicare Other | Attending: Cardiovascular Disease

## 2018-04-08 ENCOUNTER — Ambulatory Visit (HOSPITAL_COMMUNITY): Payer: Medicare Other

## 2018-04-08 LAB — MYOCARDIAL PERFUSION IMAGING
CHL CUP NUCLEAR SSS: 7
LV sys vol: 66 mL
LVDIAVOL: 151 mL (ref 62–150)
Peak HR: 90 {beats}/min
Rest HR: 75 {beats}/min
SDS: 3
SRS: 4
TID: 1.23

## 2018-04-12 ENCOUNTER — Other Ambulatory Visit: Payer: Self-pay | Admitting: Cardiology

## 2018-04-12 ENCOUNTER — Telehealth: Payer: Self-pay

## 2018-04-12 DIAGNOSIS — R9439 Abnormal result of other cardiovascular function study: Secondary | ICD-10-CM

## 2018-04-12 DIAGNOSIS — R931 Abnormal findings on diagnostic imaging of heart and coronary circulation: Secondary | ICD-10-CM

## 2018-04-12 DIAGNOSIS — Z01818 Encounter for other preprocedural examination: Secondary | ICD-10-CM

## 2018-04-12 DIAGNOSIS — I2510 Atherosclerotic heart disease of native coronary artery without angina pectoris: Secondary | ICD-10-CM

## 2018-04-12 MED ORDER — METOPROLOL TARTRATE 100 MG PO TABS: ORAL_TABLET | ORAL | 0 refills | Status: DC

## 2018-04-12 NOTE — Telephone Encounter (Signed)
Spoke with the patient, he accepted the test. He expressed understanding about the instructions, a letter with the instructions will be sent to his home. He was given the 100 mg lopressor medication. Sending message to [place].

## 2018-04-12 NOTE — Telephone Encounter (Signed)
-----   Message from Sueanne Margarita, MD sent at 04/12/2018  2:08 PM EST ----- Cardiac CT with FFR

## 2018-04-12 NOTE — Telephone Encounter (Signed)
Notes recorded by Sarina Ill, RN on 04/12/2018 at 2:04 PM EST Do you want a CT angio or cardiac CT, with FFR? ------  Notes recorded by Sueanne Margarita, MD on 04/12/2018 at 1:27 PM EST Please let patient know that I am very concerned that he could have significant CAD given his calcium score and the abnormal stress test and I really highly recommend that he undergo the cardiac CT angio because if he does have significant CAD he is at risk for LV dysfunction or myocardial infarction ------  Notes recorded by Sarina Ill, RN on 04/12/2018 at 8:10 AM EST The patient has been notified of the result and verbalized understanding. He stated he did not want to go forward with the cardiac CT at this time. All questions (if any) were answered. Sarina Ill, RN 04/12/2018 8:09 AM ------  Notes recorded by Sueanne Margarita, MD on 04/08/2018 at 11:32 PM EST Possible small area of inferoapical ischemia vs. Diaphragmatic attenuation. Given high calcium score please get coronary CTA with morphology and FFR

## 2018-04-18 DIAGNOSIS — Z713 Dietary counseling and surveillance: Secondary | ICD-10-CM | POA: Diagnosis not present

## 2018-04-18 DIAGNOSIS — Z6841 Body Mass Index (BMI) 40.0 and over, adult: Secondary | ICD-10-CM | POA: Diagnosis not present

## 2018-04-19 DIAGNOSIS — L570 Actinic keratosis: Secondary | ICD-10-CM | POA: Diagnosis not present

## 2018-04-19 DIAGNOSIS — L814 Other melanin hyperpigmentation: Secondary | ICD-10-CM | POA: Diagnosis not present

## 2018-04-19 DIAGNOSIS — D1801 Hemangioma of skin and subcutaneous tissue: Secondary | ICD-10-CM | POA: Diagnosis not present

## 2018-04-19 DIAGNOSIS — D225 Melanocytic nevi of trunk: Secondary | ICD-10-CM | POA: Diagnosis not present

## 2018-04-19 DIAGNOSIS — Z85820 Personal history of malignant melanoma of skin: Secondary | ICD-10-CM | POA: Diagnosis not present

## 2018-04-21 NOTE — Telephone Encounter (Signed)
Pt is in the midst of further cardiac evaluation with high risk features for possible myocardial ischemia.  The patient will need to arrange for this procedure once evaluation complete. I will remove this request from the preop box.

## 2018-05-03 DIAGNOSIS — B372 Candidiasis of skin and nail: Secondary | ICD-10-CM | POA: Diagnosis not present

## 2018-05-03 DIAGNOSIS — E8881 Metabolic syndrome: Secondary | ICD-10-CM | POA: Diagnosis not present

## 2018-05-03 DIAGNOSIS — Z6841 Body Mass Index (BMI) 40.0 and over, adult: Secondary | ICD-10-CM | POA: Diagnosis not present

## 2018-05-03 DIAGNOSIS — I10 Essential (primary) hypertension: Secondary | ICD-10-CM | POA: Diagnosis not present

## 2018-05-11 ENCOUNTER — Other Ambulatory Visit: Payer: Medicare Other | Admitting: *Deleted

## 2018-05-11 DIAGNOSIS — Z01818 Encounter for other preprocedural examination: Secondary | ICD-10-CM

## 2018-05-11 DIAGNOSIS — R931 Abnormal findings on diagnostic imaging of heart and coronary circulation: Secondary | ICD-10-CM | POA: Diagnosis not present

## 2018-05-11 DIAGNOSIS — I2510 Atherosclerotic heart disease of native coronary artery without angina pectoris: Secondary | ICD-10-CM

## 2018-05-11 DIAGNOSIS — R9439 Abnormal result of other cardiovascular function study: Secondary | ICD-10-CM | POA: Diagnosis not present

## 2018-05-11 LAB — BASIC METABOLIC PANEL
BUN/Creatinine Ratio: 22 (ref 10–24)
BUN: 23 mg/dL (ref 8–27)
CO2: 26 mmol/L (ref 20–29)
Calcium: 9.3 mg/dL (ref 8.6–10.2)
Chloride: 100 mmol/L (ref 96–106)
Creatinine, Ser: 1.04 mg/dL (ref 0.76–1.27)
GFR calc Af Amer: 83 mL/min/{1.73_m2} (ref >59)
GFR calc non Af Amer: 71 mL/min/{1.73_m2} (ref >59)
GLUCOSE: 94 mg/dL (ref 65–99)
Potassium: 4.5 mmol/L (ref 3.5–5.2)
Sodium: 140 mmol/L (ref 134–144)

## 2018-05-12 DIAGNOSIS — Z6841 Body Mass Index (BMI) 40.0 and over, adult: Secondary | ICD-10-CM | POA: Diagnosis not present

## 2018-05-13 ENCOUNTER — Telehealth (HOSPITAL_COMMUNITY): Payer: Self-pay | Admitting: Emergency Medicine

## 2018-05-13 NOTE — Telephone Encounter (Signed)
Reaching out to patient to offer assistance regarding upcoming cardiac imaging study; pt verbalizes understanding of appt date/time, parking situation and where to check in, pre-test NPO status and medications ordered, and verified current allergies; name and call back number provided for further questions should they arise Curtis Tomasetti RN Navigator Cardiac Imaging [facility] [phone] office [phone] cell 

## 2018-05-16 ENCOUNTER — Encounter (HOSPITAL_COMMUNITY): Payer: Self-pay

## 2018-05-16 ENCOUNTER — Ambulatory Visit (HOSPITAL_COMMUNITY)
Admission: RE | Admit: 2018-05-16 | Discharge: 2018-05-16 | Disposition: A | Discharge status: Home / Self Care | Payer: Medicare Other | Source: Ambulatory Visit | Attending: Cardiology | Admitting: Cardiology

## 2018-05-16 DIAGNOSIS — I2510 Atherosclerotic heart disease of native coronary artery without angina pectoris: Secondary | ICD-10-CM | POA: Diagnosis not present

## 2018-05-16 DIAGNOSIS — R931 Abnormal findings on diagnostic imaging of heart and coronary circulation: Secondary | ICD-10-CM | POA: Diagnosis not present

## 2018-05-16 DIAGNOSIS — R9439 Abnormal result of other cardiovascular function study: Secondary | ICD-10-CM | POA: Insufficient documentation

## 2018-05-16 MED ORDER — IOPAMIDOL (ISOVUE-370) INJECTION 76%
80.0000 mL | Freq: Once | INTRAVENOUS | Status: AC | PRN
  Administered 2018-05-16: 100 mL via INTRAVENOUS

## 2018-05-16 MED ORDER — NITROGLYCERIN 0.4 MG SL SUBL
SUBLINGUAL_TABLET | SUBLINGUAL | Status: AC
  Administered 2018-05-16: 0.8 mg
  Filled 2018-05-16: qty 2

## 2018-05-24 ENCOUNTER — Ambulatory Visit (INDEPENDENT_AMBULATORY_CARE_PROVIDER_SITE_OTHER): Payer: Medicare Other | Admitting: Physician Assistant

## 2018-05-24 ENCOUNTER — Encounter: Payer: Self-pay | Admitting: Physician Assistant

## 2018-05-24 VITALS — BP 122/82 | HR 84 | Ht 70.0 in | Wt 394.8 lb

## 2018-05-24 DIAGNOSIS — I712 Thoracic aortic aneurysm, without rupture, unspecified: Secondary | ICD-10-CM

## 2018-05-24 DIAGNOSIS — I2510 Atherosclerotic heart disease of native coronary artery without angina pectoris: Secondary | ICD-10-CM | POA: Diagnosis not present

## 2018-05-24 DIAGNOSIS — I10 Essential (primary) hypertension: Secondary | ICD-10-CM | POA: Diagnosis not present

## 2018-05-24 NOTE — Progress Notes (Signed)
Cardiology Office Note:    Date:  05/24/2018   ID:  Curtis Hill, DOB 1945-05-26, MRN [id number]  PCP:  Curtis Cruel, MD  Cardiologist:  Curtis Him, MD   Electrophysiologist:  None   Referring MD: Curtis Cruel, MD   Chief Complaint  Patient presents with  . Arrange Cardiac Catheterization     History of Present Illness:    Curtis Hill is a 73 y.o. male with iron deficiency anemia, prostate cancer, acid reflux, hypertension.  He had undergone a chest CT in July 2019 to evaluate shortness of breath.  This demonstrated no pulmonary embolism, but there was evidence of possible pulmonary hypertension, cardiomegaly, coronary calcifications and a 45 mm ascending thoracic aortic aneurysm.  He was referred to Dr. Radford Hill.  He was set up for a calcium score which demonstrated a calcium score of 439.  He had an echo that demonstrated normal LV function, mod diastolic dysfunction, a mod enlarged thoracic aorta and normal PASP.  A Myoview was abnormal with a possible inferior infarct.  Coronary CTA was arranged and demonstrated possible significant RCA and LAD stenosis and dilated RV and main pulmonary artery concerning for pulmonary HTN.  Dr. Radford Hill has recommended the patient undergo a R/L Cardiac Catheterization to further evaluate.     Curtis Hill returns to arrange his Cardiac Catheterization.  He is here alone.  He has not had any chest pain, shortness of breath, syncope, paroxysmal nocturnal dyspnea.  He has some lower extremity swelling that is unchanged.  He has difficulty laying on his back due to his pannus.    Prior CV studies:   The following studies were reviewed today:  Coronary CTA 05/16/2018 Coronary Arteries: Right dominant with no anomalies LM: 50% calcified ostial stenosis LAD: Less than 30% proximal calcified stenosis 50-75% calcified stenosis in mid vessel Less than 30% calcified stenosis distally D1: Poorly visualized D2: Poorly visualized Circumflex: 30%  calcified stenosis in mid vessel OM1: Poorly visualized OM2: Poorly visualized RCA: 50-75% calcified ostial stenosis Less than 30% calcified stenosis in mid and distal vessel PDA: Poorly visualized PLA: Poorly visualized IMPRESSION: 1. Sub-optimal and non diagnostic study due to patients body habitus and more opacification of the right side than left 2. Cannot r/o significant ostial RCA and mid LAD stenosis Study sent for FFR CT but not likely acceptable 3. Calcium score 691 involving LM and all 3 major epicardial vessels 4. Dilated RV and main pulmonary artery suggesting pulmonary hypertension Given abnormal myovue and sub optimal CTA with high calcium score and evidence of elevated right heart pressures would consider Diagnostic right and left heart catheterization for definitive diagnosis Curtis Hill IMPRESSION: 1. No acute cardiopulmonary abnormalities identified. 2. 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 3. Ascending thoracic aortic aneurysm. Recommend annual imaging followup by CTA or MRA. This recommendation follows 2010 ACCF/AHA/AATS/ACR/ASA/SCA/SCAI/SIR/STS/SVM Guidelines for the Diagnosis and Management of Patients with Thoracic Aortic Disease. Circulation. 2010; 121: M196-Q229. Aortic aneurysm NOS (ICD10-I71.9)  Myoview 04/08/18 EF 56, inferoseptal, inferior, apical inferior defect (likely diaphragmatic attenuation; cannot rule out prior infarct), low risk study  CT Calcium Score 04/07/18 IMPRESSION: Coronary calcium score of 439. This was 76 th percentile for age and sex matched control. Calcified aortic valve with dilated root suggest f/u with echo and possibly CTA  Echocardiogram 04/04/2018 Mild concentric LVH, EF 60-65, normal wall motion, grade 2 diastolic dysfunction, mild aortic stenosis (mean 12 mmHg), moderately dilated ascending aorta (45 mm), mild  MR, normal RV SF, trivial TR, PASP 23, trivial pericardial  effusion, GLS -15.3% (mildly abnormal)  Chest CTA 10/25/17 IMPRESSION: 1. No acute cardiopulmonary disease. Specifically, no evidence of pulmonary embolism to the level of the bilateral subsegmental pulmonary arteries. 2. Cardiomegaly and enlargement of the caliber the main pulmonary artery, nonspecific though could be seen in the setting of pulmonary arterial hypertension. Further evaluation cardiac echo could be performed as clinically indicated. 3. Coronary artery calcifications. Aortic Atherosclerosis (ICD10-I70.0). 4. Uncomplicated mild fusiform aneurysmal dilatation the ascending thoracic aorta measuring 45 mm in diameter. Ascending thoracic aortic aneurysm. Recommend semi-annual imaging followup by CTA or MRA and referral to cardiothoracic surgery if not already obtained. This recommendation follows 2010 ACCF/AHA/AATS/ACR/ASA/SCA/SCAI/SIR/STS/SVM Guidelines for the Diagnosis and Management of Patients With Thoracic Aortic Disease. Circulation. 2010; 121: B147-W295. Aortic aneurysm NOS (ICD10-I71.9).  Past Medical History:  Diagnosis Date  . ABDOMINAL PAIN 05/18/2007   Qualifier: Diagnosis of  By: Curtis Nanny LPN, Regina    . ANEMIA, IRON DEFICIENCY 05/18/2007   Qualifier: Diagnosis of  By: Curtis Nanny LPN, Regina    . Arthritis   . Benign neoplasm of colon 03/28/2012  . [facility])    prostate cancer  . Cardiomegaly 05/18/2007   Qualifier: Diagnosis of  By: Curtis Nanny LPN, Regina    . Chronic back pain   . Coronary artery calcification seen on CAT scan 03/17/2018  . EPIGASTRIC PAIN 05/18/2007   Qualifier: Diagnosis of  By: Curtis Nanny LPN, Regina    . GERD (gastroesophageal reflux disease)   . H/O hiatal hernia   . HIATAL HERNIA 05/18/2007   Qualifier: Diagnosis of  By: Curtis Nanny LPN, Regina    . Hypertension   . LEUKOCYTOSIS 05/18/2007   Qualifier: Diagnosis of  By: Curtis Nanny LPN, Regina    . LIVER FUNCTION TESTS, ABNORMAL 05/18/2007   Qualifier: Diagnosis of  By: Curtis Nanny LPN, Regina    . MORBID OBESITY 05/18/2007    Qualifier: Diagnosis of  By: Curtis Nanny LPN, Regina    . SINUSITIS, ACUTE 05/18/2007   Qualifier: Diagnosis of  By: Curtis Nanny LPN, Regina    . Thoracic aortic aneurysm ([place]) 03/17/2018   74mm by chest CTA 10/2017  . THROMBOCYTOSIS 05/18/2007   Qualifier: Diagnosis of  By: Curtis Nanny LPN, Regina     Surgical Hx: The patient  has a past surgical history that includes prostate seed implants; left leg surgery; Tonsillectomy; Flexible sigmoidoscopy (03/28/2012); and Hot hemostasis (03/28/2012).   Current Medications: Current Meds  Medication Sig  . albuterol (PROVENTIL HFA;VENTOLIN HFA) 108 (90 Base) MCG/ACT inhaler Inhale 2 puffs into the lungs every 6 (six) hours as needed for wheezing or shortness of breath.  . allopurinol (ZYLOPRIM) 300 MG tablet Take 300 mg by mouth daily.  Marland Kitchen aspirin 81 MG tablet Take 81 mg by mouth daily.  Marland Kitchen CALCIUM CARBONATE-VITAMIN D PO Take 1 tablet by mouth daily. 600 mg chewable  . Cyanocobalamin (VITAMIN B12 PO) Take 5,000 mcg by mouth daily.   . Famotidine-Ca Carb-Mag Hydrox (PEPCID COMPLETE PO) Take 1 tablet by mouth 2 (two) times daily as needed.   . fluticasone (FLONASE) 50 MCG/ACT nasal spray Place into both nostrils daily.  . furosemide (LASIX) 40 MG tablet Take 40 mg by mouth daily.  Marland Kitchen KRILL OIL 1000 MG CAPS Take 1 capsule by mouth daily.   Marland Kitchen loratadine (CLARITIN) 10 MG tablet Take 10 mg by mouth daily.  Marland Kitchen MAGNESIUM PO Take 1 capsule by mouth. WITH A MEAL  . meloxicam (MOBIC) 15 MG tablet Take 15  mg by mouth daily.   . metoprolol tartrate (LOPRESSOR) 100 MG tablet Take 100 mg, 2 hours before your cardiac CT.  . multivitamin-iron-minerals-folic acid (CENTRUM) chewable tablet Chew 1 tablet by mouth daily.  . traMADol (ULTRAM) 50 MG tablet Take 50-100 mg by mouth every 6 (six) hours as needed.  Marland Kitchen Ubiquinol 200 MG CAPS Take 1 capsule by mouth daily. AS DIRECTED   . valsartan (DIOVAN) 320 MG tablet Take 320 mg by mouth daily.  . Vitamin D, Ergocalciferol, 2000 units CAPS Take 1  capsule by mouth daily.     Allergies:   Levofloxacin and Lisinopril   Social History   Tobacco Use  . Smoking status: Never Smoker  . Smokeless tobacco: Never Used  Substance Use Topics  . Alcohol use: Yes    Comment: 1-2 times a month  . Drug use: No     Family Hx: The patient's family history includes Breast cancer in his mother; Diabetes in his mother; Fibromyalgia in his sister.  ROS:   Please see the history of present illness.    Review of Systems  Constitution: Negative for fever.  Respiratory: Negative for cough.   Gastrointestinal: Negative for hematochezia and melena.  Genitourinary: Negative for hematuria.   All other systems reviewed and are negative.   EKGs/Labs/Other Test Reviewed:    EKG:  EKG is  ordered today.  The ekg ordered today demonstrates normal sinus rhythm, HR 84, LAFB, RBBB, QTc 491, no change from prior ECG  Recent Labs: 10/25/2017: B Natriuretic Peptide 69.9; Hemoglobin 13.1; Platelets 253 03/17/2018: ALT 24 05/11/2018: BUN 23; Creatinine, Ser 1.04; Potassium 4.5; Sodium 140   Recent Lipid Panel Lab Results  Component Value Date/Time   CHOL 151 03/17/2018 03:03 PM   TRIG 75 03/17/2018 03:03 PM   HDL 63 03/17/2018 03:03 PM   CHOLHDL 2.4 03/17/2018 03:03 PM   CHOLHDL 9.6 02/01/2007 05:20 AM   LDLCALC 73 03/17/2018 03:03 PM    Physical Exam:    VS:  BP 122/82   Pulse 84   Ht 5\' 10"  (1.778 m)   Wt (!) 394 lb 12.8 oz (179.1 kg)   SpO2 97%   BMI 56.65 kg/m     Wt Readings from Last 3 Encounters:  05/24/18 (!) 394 lb 12.8 oz (179.1 kg)  04/07/18 (!) 400 lb (181.4 kg)  03/17/18 (!) 400 lb (181.4 kg)     Physical Exam  Constitutional: He is oriented to person, place, and time. He appears well-developed and well-nourished. No distress.  HENT:  Head: Normocephalic and atraumatic.  Eyes: No scleral icterus.  Neck: No thyromegaly present.  Cardiovascular: Normal rate and regular rhythm.  Murmur heard.  Systolic murmur is present  with a grade of 2/6 at the upper right sternal border and upper left sternal border. Pulmonary/Chest: Effort normal. He has no wheezes. He has no rales.  Abdominal: Soft.  Musculoskeletal:        General: Edema (trace-1+ bilat LE edema) present.  Lymphadenopathy:    He has no cervical adenopathy.  Neurological: He is alert and oriented to person, place, and time.  Skin: Skin is warm and dry.  Psychiatric: He has a normal mood and affect.    ASSESSMENT & PLAN:    Coronary artery disease involving native coronary artery of native heart without angina pectoris Recent coronary CTA suggests possible significant disease in the ostial RCA and mid LAD.  The RV and main pulmonary artery is also dilated.  Dr. Radford Hill has recommended  proceeding with a R and L Cardiac Catheterization.  Risks and benefits of cardiac catheterization have been discussed with the patient.  These include bleeding, infection, kidney damage, stroke, heart attack, death.  The patient understands these risks and is willing to proceed.  He is redoing his will and would like to schedule his procedure once this is done.  He will call back when he is ready to schedule.  We will draw his labs once his procedure is scheduled.  He will need a follow up with Dr. Radford Hill or her care team 2 weeks after his procedure.  He should probably be on a statin.  However his recent LDL was 75.  Will await results of his heart cath prior to deciding the intensity of his Rx.  Continue ASA.   Thoracic aortic aneurysm without rupture (HCC) 45 mm by recent CTA.  This will be followed with serial angiograms.  Essential hypertension The patient's blood pressure is controlled on his current regimen.  Continue current therapy.     Dispo:  Return in about 4 weeks (around 06/21/2018) for Post Procedure Follow Up w/ Dr. Radford Hill, or PA/NP.   Medication Adjustments/Labs and Tests Ordered: Current medicines are reviewed at length with the patient today.  Concerns  regarding medicines are outlined above.  Tests Ordered: Orders Placed This Encounter  Procedures  . EKG 12-Lead   Medication Changes: No orders of the defined types were placed in this encounter.   Signed, Richardson Dopp, PA-C  05/24/2018 5:38 PM    [facility] [place], [place], [place]  [address] Phone: [phone]; Fax: [phone]

## 2018-05-24 NOTE — Patient Instructions (Signed)
Medication Instructions:  Your physician recommends that you continue on your current medications as directed. Please refer to the Current Medication list given to you today.  If you need a refill on your cardiac medications before your next appointment, please call your pharmacy.   Lab work: None  If you have labs (blood work) drawn today and your tests are completely normal, you will receive your results only by: Marland Kitchen MyChart Message (if you have MyChart) OR . A paper copy in the mail If you have any lab test that is abnormal or we need to change your treatment, we will call you to review the results.  Testing/Procedures: Your physician has requested that you have a cardiac catheterization. Cardiac catheterization is used to diagnose and/or treat various heart conditions. Doctors may recommend this procedure for a number of different reasons. The most common reason is to evaluate chest pain. Chest pain can be a symptom of coronary artery disease (CAD), and cardiac catheterization can show whether plaque is narrowing or blocking your heart's arteries. This procedure is also used to evaluate the valves, as well as measure the blood flow and oxygen levels in different parts of your heart. For further information please visit [email or web address]. Please follow instruction sheet, as given. PLEASE CALL OUR OFFICE TO SCHEDULE YOU PROCEDURE WHEN YOU ARE READY   Follow-Up: At [facility], you and your health needs are our priority.  As part of our continuing mission to provide you with exceptional heart care, we have created designated Provider Care Teams.  These Care Teams include your primary Cardiologist (physician) and Advanced Practice Providers (APPs -  Physician Assistants and Nurse Practitioners) who all work together to provide you with the care you need, when you need it. You will need a follow up appointment in 2 weeks. You may see Fransico Him, MD or one of the following Advanced Practice  Providers on your designated Care Team:   [place], PA-C Melina Copa, PA-C . Ermalinda Barrios, PA-C  Any Other Special Instructions Will Be Listed Below (If Applicable).     [facility] OFFICE [place], [place] [address] Dept: [phone] Loc: [place]  05/24/2018  You are scheduled for a Cardiac Catheterization on           ,                 with Dr.                   .  1. Please arrive at the [facility] (Main Entrance A) at [facility]: [place], [place] [address] at                     (This time is two hours before your procedure to ensure your preparation). Free valet parking service is available.   Special note: Every effort is made to have your procedure done on time. Please understand that emergencies sometimes delay scheduled procedures.  2. Diet: Do not eat solid foods after midnight.  The patient may have clear liquids until 5am upon the day of the procedure.  3. Labs: You will need to have blood drawn [place] at [facility] at [facility]. [address]  Open: 7:30am - 5pm    Phone: [phone]. You do not need to be fasting.  4. Medication  instructions in preparation for your procedure:   Contrast Allergy: No  On the morning of your procedure, take your Aspirin and any morning medicines NOT listed above.  You may use sips of water.  5. Plan for one night stay--bring personal belongings. 6. Bring a current list of your medications and current insurance cards. 7. You MUST have a responsible person to drive you home. 8. Someone MUST be with you the first 24 hours after you arrive home or your discharge will be delayed. 9. Please wear clothes that are easy to get on and off and wear slip-on shoes.  Thank you for allowing [place] to care for you!   -- [facility]

## 2018-05-30 DIAGNOSIS — M1990 Unspecified osteoarthritis, unspecified site: Secondary | ICD-10-CM | POA: Diagnosis not present

## 2018-05-30 DIAGNOSIS — M549 Dorsalgia, unspecified: Secondary | ICD-10-CM | POA: Diagnosis not present

## 2018-05-30 DIAGNOSIS — J4520 Mild intermittent asthma, uncomplicated: Secondary | ICD-10-CM | POA: Diagnosis not present

## 2018-06-01 ENCOUNTER — Telehealth: Payer: Self-pay

## 2018-06-01 DIAGNOSIS — Z7182 Exercise counseling: Secondary | ICD-10-CM | POA: Diagnosis not present

## 2018-06-01 DIAGNOSIS — Z6841 Body Mass Index (BMI) 40.0 and over, adult: Secondary | ICD-10-CM | POA: Diagnosis not present

## 2018-06-01 NOTE — Telephone Encounter (Signed)
Spoke with pt who says that he is still waiting to get his will in order and believes that he has appointment with his attorney nest week. Pt says that he will call our office when he gets everything final.

## 2018-06-01 NOTE — Telephone Encounter (Signed)
-----   Message from Liliane Shi, [place] sent at 06/01/2018  5:21 PM EST ----- Regarding: schedule heart cath Please follow up with this patient to schedule his Cardiac Catheterization. Richardson Dopp, PA-C    06/01/2018 5:22 PM

## 2018-06-13 NOTE — Telephone Encounter (Signed)
SPOKE WITH PT AND PT VERBALIZED UNDERSTANDING THAT A NEW OFFICE VISIT WILL  HAVE TO  BE RESCHEDULED DUE TO 30 DAY TIMEFRAME FOR HEART CATH.  PT SCHEDULED TO SEE SIMMONS AT 10 AM ON  07-05-18

## 2018-06-13 NOTE — Telephone Encounter (Signed)
Please call patient to see if he is ready to schedule his Cardiac Catheterization.   He was seen on 05/24/2018 to schedule this but he had to take care of some personal things first. The procedure has to be done within 30 days of his visit on 05/24/2018.  If it is not done in that timeframe, we will have to see him again in the office prior to his procedure.  I just want to make sure he is aware of this requirement.   Richardson Dopp, PA-C    06/13/2018 10:30 AM

## 2018-06-14 DIAGNOSIS — Q142 Congenital malformation of optic disc: Secondary | ICD-10-CM | POA: Diagnosis not present

## 2018-06-14 DIAGNOSIS — E119 Type 2 diabetes mellitus without complications: Secondary | ICD-10-CM | POA: Diagnosis not present

## 2018-06-16 DIAGNOSIS — Z6841 Body Mass Index (BMI) 40.0 and over, adult: Secondary | ICD-10-CM | POA: Diagnosis not present

## 2018-06-30 ENCOUNTER — Telehealth: Payer: Self-pay | Admitting: *Deleted

## 2018-06-30 NOTE — Telephone Encounter (Signed)
   Cardiac Questionnaire:    Since your last visit or hospitalization:    1. Have you been having new or worsening chest pain? no   2. Have you been having new or worsening shortness of breath? no 3. Have you been having new or worsening leg swelling, wt gain, or increase in abdominal girth (pants fitting more tightly)? no   4. Have you had any passing out spells? no     If all the answers to these questions are NO, we should indicate that given the current situation regarding the worldwide coronarvirus pandemic, at the recommendation of the CDC, we are looking to limit gatherings in our waiting area, and thus will reschedule their appointment beyond four weeks from today.   _____________   I reviewed Cardiac Questionnaire with patient, he was glad to reschedule the appointment, thanked me for call. Pt is aware to contact our office if symptoms change.

## 2018-06-30 NOTE — Telephone Encounter (Signed)
LMTCB to discuss rescheduling appointment with B Simmons, PA 07/05/18.

## 2018-07-05 ENCOUNTER — Ambulatory Visit: Payer: Medicare Other | Admitting: Cardiology

## 2018-07-18 ENCOUNTER — Telehealth: Payer: Self-pay | Admitting: Cardiology

## 2018-07-18 NOTE — Telephone Encounter (Signed)
[place] 07/19/18 @ 1:00  Telephone

## 2018-07-19 ENCOUNTER — Telehealth (INDEPENDENT_AMBULATORY_CARE_PROVIDER_SITE_OTHER): Payer: Medicare Other | Admitting: Cardiology

## 2018-07-19 ENCOUNTER — Encounter: Payer: Self-pay | Admitting: Cardiology

## 2018-07-19 ENCOUNTER — Other Ambulatory Visit: Payer: Self-pay

## 2018-07-19 VITALS — BP 141/76 | HR 64 | Ht 70.0 in | Wt 394.7 lb

## 2018-07-19 DIAGNOSIS — I350 Nonrheumatic aortic (valve) stenosis: Secondary | ICD-10-CM | POA: Diagnosis not present

## 2018-07-19 DIAGNOSIS — I712 Thoracic aortic aneurysm, without rupture, unspecified: Secondary | ICD-10-CM

## 2018-07-19 DIAGNOSIS — I10 Essential (primary) hypertension: Secondary | ICD-10-CM

## 2018-07-19 DIAGNOSIS — I2510 Atherosclerotic heart disease of native coronary artery without angina pectoris: Secondary | ICD-10-CM | POA: Diagnosis not present

## 2018-07-19 DIAGNOSIS — E7800 Pure hypercholesterolemia, unspecified: Secondary | ICD-10-CM

## 2018-07-19 DIAGNOSIS — E785 Hyperlipidemia, unspecified: Secondary | ICD-10-CM | POA: Insufficient documentation

## 2018-07-19 HISTORY — DX: Nonrheumatic aortic (valve) stenosis: I35.0

## 2018-07-19 MED ORDER — ATORVASTATIN CALCIUM 20 MG PO TABS: 20.0000 mg | ORAL_TABLET | Freq: Every day | ORAL | 3 refills | Status: DC

## 2018-07-19 NOTE — Telephone Encounter (Signed)
TELEPHONE CALL NOTE  Curtis Hill has been deemed a candidate for a follow-up tele-health visit to limit community exposure during the Covid-19 pandemic. I spoke with the patient via phone to ensure availability of phone/video source, confirm preferred email & phone number, and discuss instructions and expectations.  I reminded Curtis Hill to be prepared with any vital sign and/or heart rhythm information that could potentially be obtained via home monitoring, at the time of his visit. I reminded Curtis Hill to expect a phone call at the time of his visit if his visit.  Did the patient verbally acknowledge consent to treatment? YES  Sarina Ill, RN 07/19/2018 9:04 AM  CONSENT FOR TELE-HEALTH VISIT - PLEASE REVIEW  I hereby voluntarily request, consent and authorize [facility] and its employed or contracted physicians, physician assistants, nurse practitioners or other licensed health care professionals (the Practitioner), to provide me with telemedicine health care services (the "Services") as deemed necessary by the treating Practitioner. I acknowledge and consent to receive the Services by the Practitioner via telemedicine. I understand that the telemedicine visit will involve communicating with the Practitioner through live audiovisual communication technology and the disclosure of certain medical information by electronic transmission. I acknowledge that I have been given the opportunity to request an in-person assessment or other available alternative prior to the telemedicine visit and am voluntarily participating in the telemedicine visit.  I understand that I have the right to withhold or withdraw my consent to the use of telemedicine in the course of my care at any time, without affecting my right to future care or treatment, and that the Practitioner or I may terminate the telemedicine visit at any time. I understand that I have the right to inspect all information obtained  and/or recorded in the course of the telemedicine visit and may receive copies of available information for a reasonable fee.  I understand that some of the potential risks of receiving the Services via telemedicine include:  Marland Kitchen Delay or interruption in medical evaluation due to technological equipment failure or disruption; . Information transmitted may not be sufficient (e.g. poor resolution of images) to allow for appropriate medical decision making by the Practitioner; and/or  . In rare instances, security protocols could fail, causing a breach of personal health information.  Furthermore, I acknowledge that it is my responsibility to provide information about my medical history, conditions and care that is complete and accurate to the best of my ability. I acknowledge that Practitioner's advice, recommendations, and/or decision may be based on factors not within their control, such as incomplete or inaccurate data provided by me or distortions of diagnostic images or specimens that may result from electronic transmissions. I understand that the practice of medicine is not an exact science and that Practitioner makes no warranties or guarantees regarding treatment outcomes. I acknowledge that I will receive a copy of this consent concurrently upon execution via email to the email address I last provided but may also request a printed copy by calling the office of [place].    I understand that my insurance will be billed for this visit.   I have read or had this consent read to me. . I understand the contents of this consent, which adequately explains the benefits and risks of the Services being provided via telemedicine.  . I have been provided ample opportunity to ask questions regarding this consent and the Services and have had my questions answered to my satisfaction. Marland Kitchen  I give my informed consent for the services to be provided through the use of telemedicine in my medical care  By  participating in this telemedicine visit I agree to the above.

## 2018-07-19 NOTE — Patient Instructions (Addendum)
Medication Instructions:  Start: Lipitor 20 mg, daily, by mouth  If you need a refill on your cardiac medications before your next appointment, please call your pharmacy.   Lab work: Fasting labs: Lipid, Liver, CBC, BMET at the end of June  If you have labs (blood work) drawn today and your tests are completely normal, you will receive your results only by: Marland Kitchen MyChart Message (if you have MyChart) OR . A paper copy in the mail If you have any lab test that is abnormal or we need to change your treatment, we will call you to review the results.  Testing/Procedures: Your physician has requested that you have a cardiac catheterization at the end of June. Cardiac catheterization is used to diagnose and/or treat various heart conditions. Doctors may recommend this procedure for a number of different reasons. The most common reason is to evaluate chest pain. Chest pain can be a symptom of coronary artery disease (CAD), and cardiac catheterization can show whether plaque is narrowing or blocking your heart's arteries. This procedure is also used to evaluate the valves, as well as measure the blood flow and oxygen levels in different parts of your heart. For further information please visit [email or web address]. Please follow instruction sheet, as given.  Follow-Up: At [facility], you and your health needs are our priority.  As part of our continuing mission to provide you with exceptional heart care, we have created designated Provider Care Teams.  These Care Teams include your primary Cardiologist (physician) and Advanced Practice Providers (APPs -  Physician Assistants and Nurse Practitioners) who all work together to provide you with the care you need, when you need it.  Your physician recommends that you schedule a follow-up appointment in: In June to schedule the Cardiac Cath.    You will need a follow up appointment in 6 months.  Please call our office 2 months in advance to schedule this  appointment.  You may see Fransico Him, MD or one of the following Advanced Practice Providers on your designated Care Team:   [place], PA-C Melina Copa, PA-C . Ermalinda Barrios, PA-C
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
## 2018-08-09 IMAGING — DX DG CHEST 2V
3 series · 3 of 3 positions shown · non-contrast
Comparison: None.

CLINICAL DATA: Cough short of breath

EXAM:
CHEST - 2 VIEW

[chest pa]
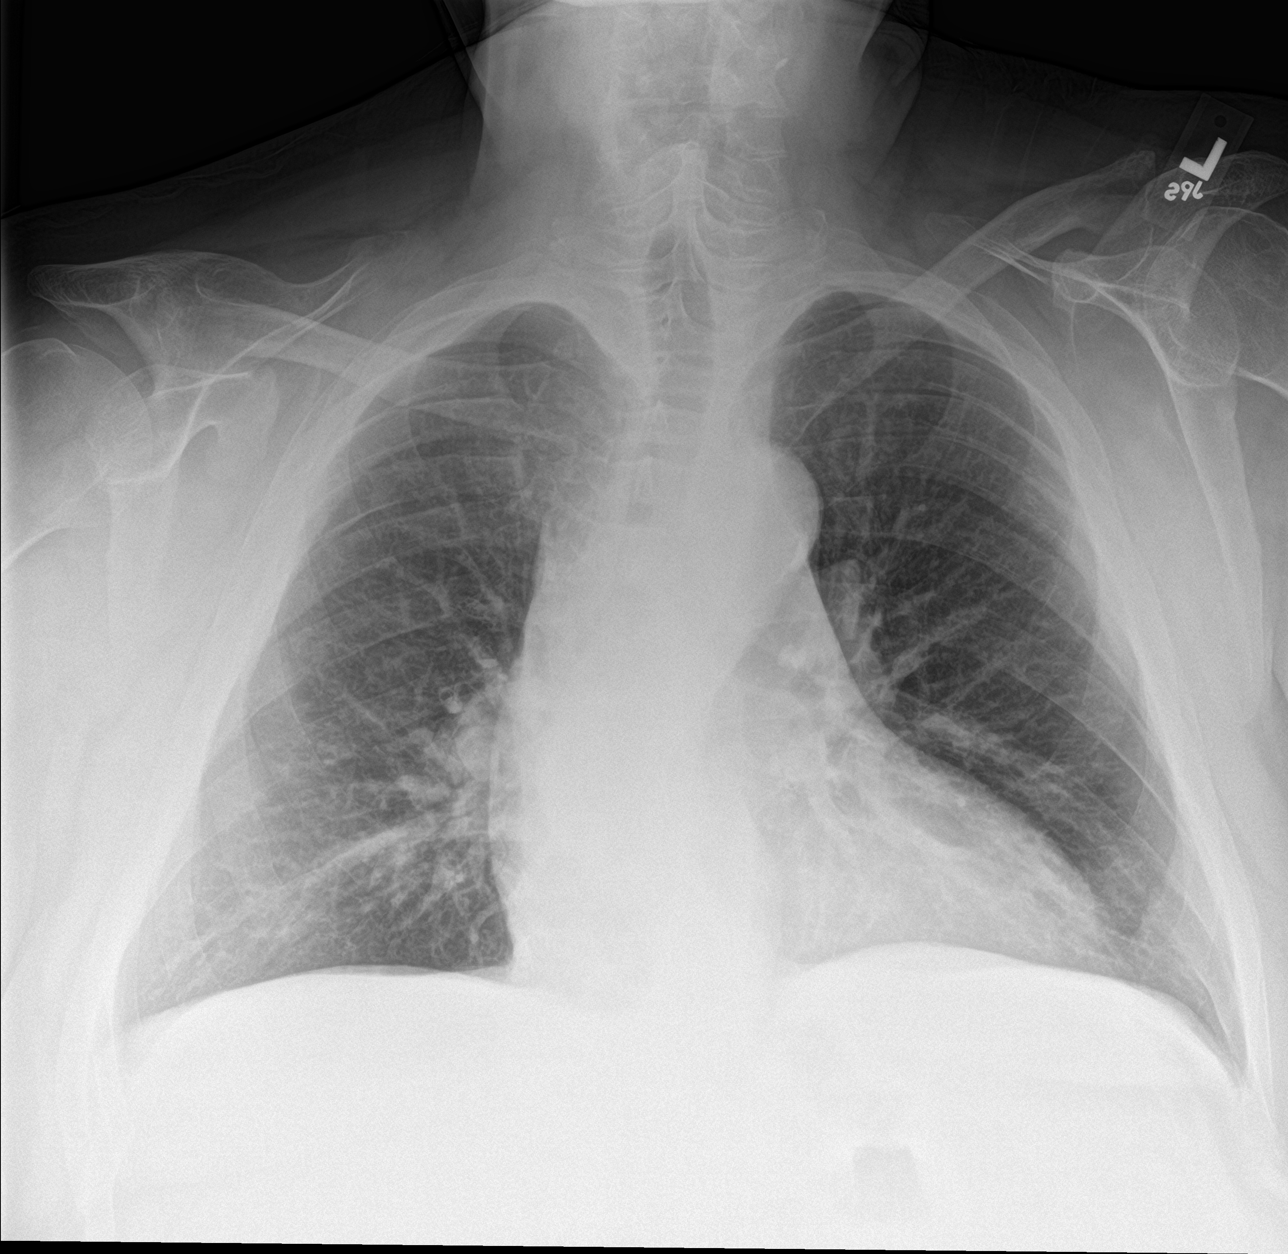

[chest lat (1 of 2)]
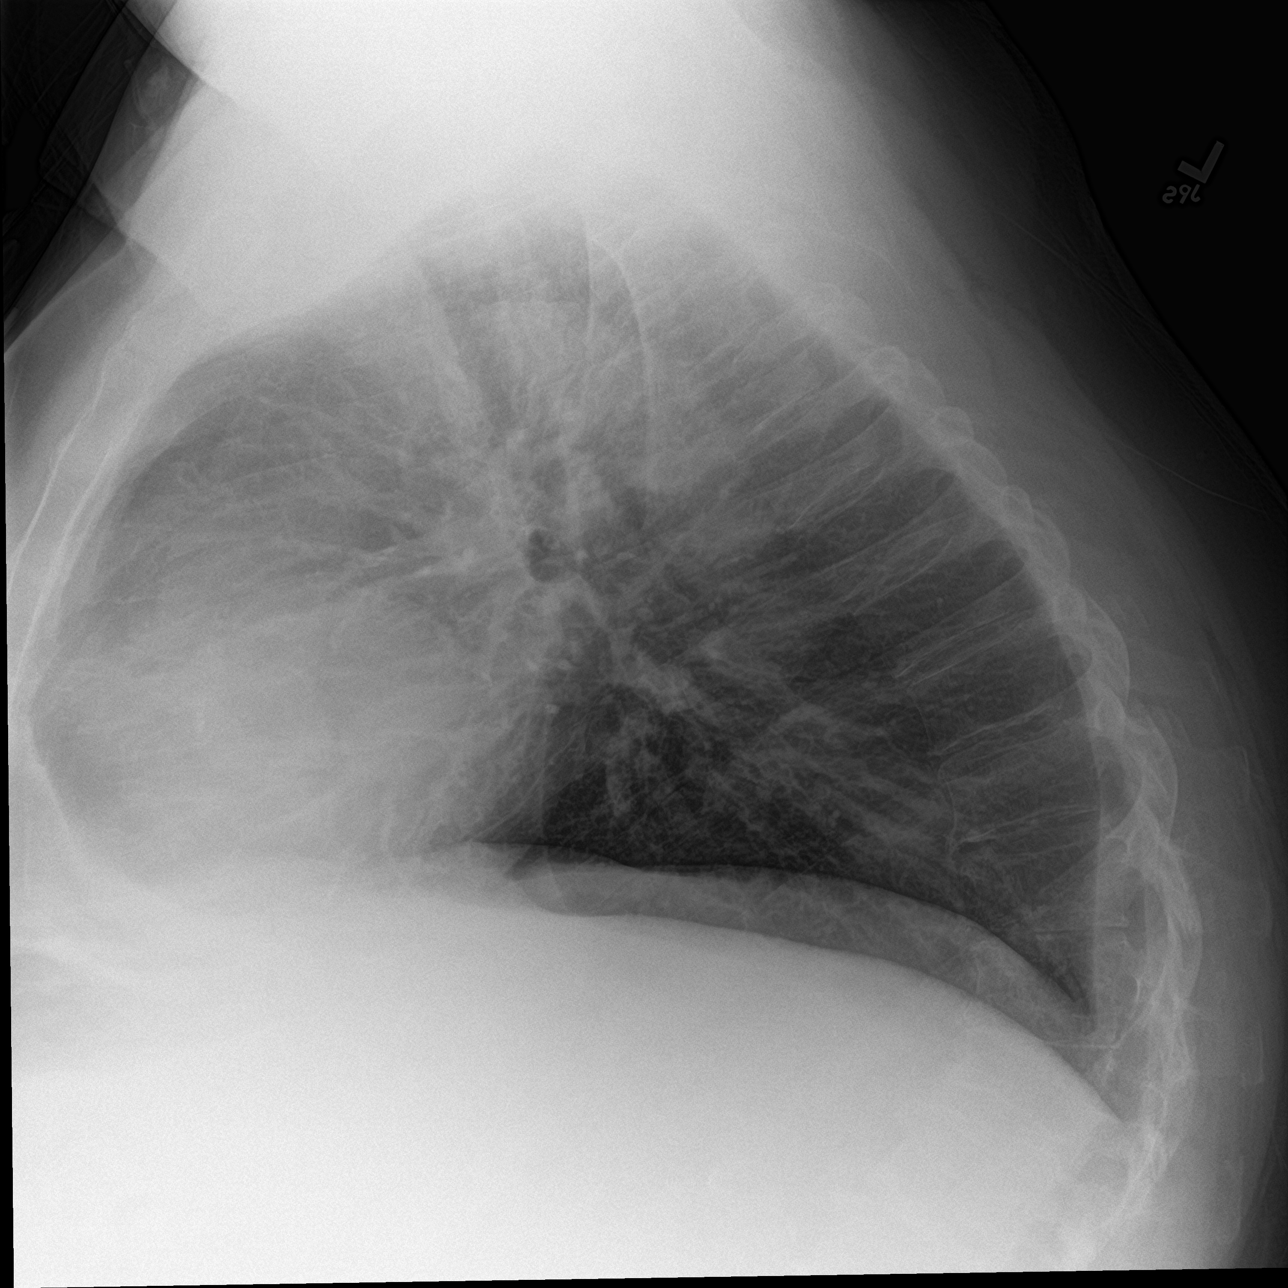

[chest lat (2 of 2)]
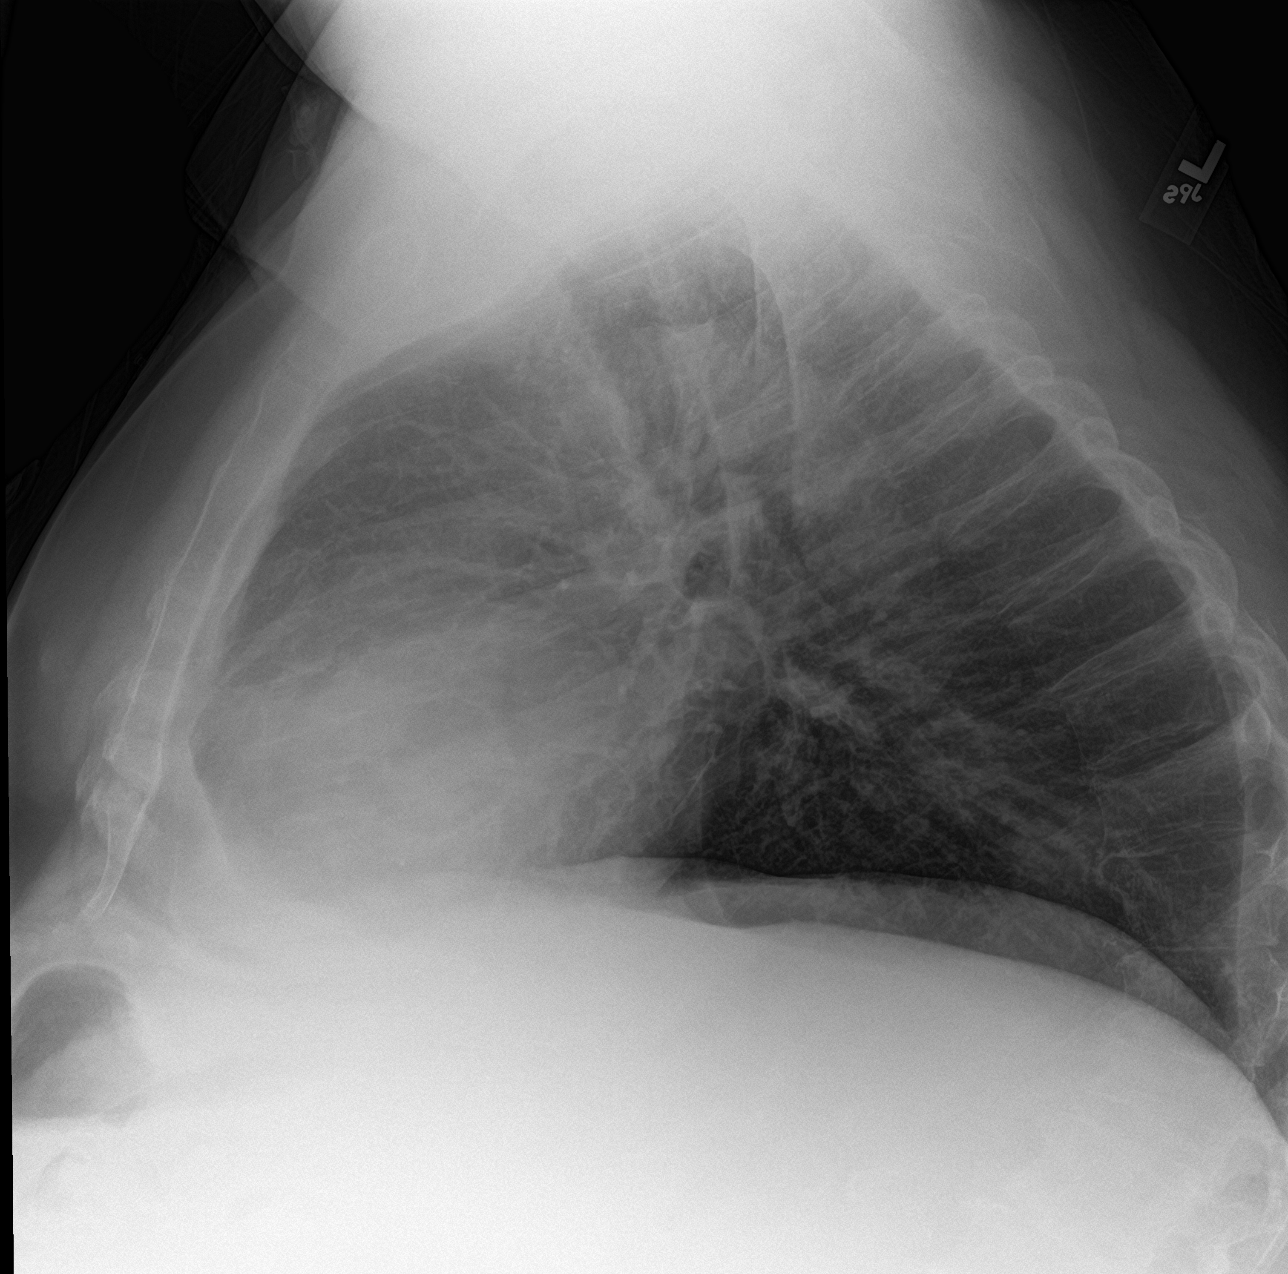

[3 of 3 positions shown; findings below may reference images not displayed]

FINDINGS: Heart size mildly enlarged. Negative for heart failure. Negative for
infiltrate or edema.

Pleural thickening posteriorly on the lateral view is unchanged from
the prior study. No layering effusion.
IMPRESSION: No active cardiopulmonary disease.

## 2018-08-18 ENCOUNTER — Other Ambulatory Visit: Payer: Self-pay | Admitting: *Deleted

## 2018-08-18 DIAGNOSIS — I712 Thoracic aortic aneurysm, without rupture, unspecified: Secondary | ICD-10-CM

## 2018-09-19 ENCOUNTER — Telehealth: Payer: Self-pay

## 2018-09-19 NOTE — Telephone Encounter (Signed)

## 2018-09-27 ENCOUNTER — Encounter: Payer: Self-pay | Admitting: Physician Assistant

## 2018-09-27 ENCOUNTER — Telehealth (INDEPENDENT_AMBULATORY_CARE_PROVIDER_SITE_OTHER): Payer: Medicare Other | Admitting: Physician Assistant

## 2018-09-27 ENCOUNTER — Other Ambulatory Visit: Payer: Self-pay

## 2018-09-27 VITALS — BP 120/83 | HR 79 | Ht 70.0 in | Wt 390.0 lb

## 2018-09-27 DIAGNOSIS — I2510 Atherosclerotic heart disease of native coronary artery without angina pectoris: Secondary | ICD-10-CM | POA: Diagnosis not present

## 2018-09-27 DIAGNOSIS — Z01812 Encounter for preprocedural laboratory examination: Secondary | ICD-10-CM
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
## 2018-09-27 NOTE — Patient Instructions (Signed)
Medication Instructions:  Your physician recommends that you continue on your current medications as directed. Please refer to the Current Medication list given to you today.  If you need a refill on your cardiac medications before your next appointment, please call your pharmacy.   Lab work: Your physician recommends that you return for a FASTING lipid profile, basic metabolic panel, and complete blood count on 10/17/18 at 8:00 AM We will get an EKG when you come in for labs.  Your Pre-procedure COVID-19 Testing will be done on 10/17/18 at 9:30 AM at [facility] at [address] [place], [place] [address] After your swab you will be given a mask to wear and instructed to go home and quarantine/no visitors until after your procedure. If you test positive you will be notified and your procedure will be cancelled.   If you have labs (blood work) drawn today and your tests are completely normal, you will receive your results only by: Marland Kitchen MyChart Message (if you have MyChart) OR . A paper copy in the mail If you have any lab test that is abnormal or we need to change your treatment, we will call you to review the results.  Testing/Procedures: Your physician has requested that you have a cardiac catheterization. Cardiac catheterization is used to diagnose and/or treat various heart conditions. Doctors may recommend this procedure for a number of different reasons. The most common reason is to evaluate chest pain. Chest pain can be a symptom of coronary artery disease (CAD), and cardiac catheterization can show whether plaque is narrowing or blocking your heart's arteries. This procedure is also used to evaluate the valves, as well as measure the blood flow and oxygen levels in different parts of your heart. For further information please visit [email or web address]. Please follow instruction sheet, as given.  Follow-Up: . Follow up with Dr. Radford Pax  On 11/02/18  at 9:20 AM  Any Other Special Instructions Will Be Listed Below (If Applicable).     [place] OFFICE [place], [place] [address] Dept: [place]: [place]  09/27/2018  You are scheduled for a Cardiac Catheterization on Thursday, July 9 with Dr. Daneen Schick.  1. Please arrive at the [facility] (Main Entrance A) at [facility]: [address] [place],  [address] at 7:00 AM (This time is two hours before your procedure to ensure your preparation). Free valet parking service is available.   Special note: Every effort is made to have your procedure done on time. Please understand that emergencies sometimes delay scheduled procedures.  2. Diet: Do not eat solid foods after midnight.  The patient may have clear liquids until 5am upon the day of the procedure.  3. Labs: You will need to have blood drawn on Monday, July 6 at [facility] at [facility] [address]  Open: 7:30am - 5pm    Phone: [phone]. You do not need to be fasting.  4. Medication instructions in preparation for your procedure:   Contrast Allergy: No  Hold furosemide (lasix) the morning of the procedure  On the morning of your procedure, take your Aspirin and any morning medicines NOT listed above.  You may use sips of water.  5. Plan for one night stay--bring personal belongings. 6. Bring a current list of your medications and current insurance cards. 7. You MUST have  a responsible person to drive you home. 8. Someone MUST be with you the first 24 hours after you arrive home or your discharge will be delayed. 9. Please wear clothes that are easy to get on and off and wear slip-on shoes.  Thank you for allowing [place] to care for you!   -- [facility]

## 2018-10-17 ENCOUNTER — Other Ambulatory Visit (INDEPENDENT_AMBULATORY_CARE_PROVIDER_SITE_OTHER): Payer: Medicare Other

## 2018-10-17 ENCOUNTER — Other Ambulatory Visit (HOSPITAL_COMMUNITY)
Admission: RE | Admit: 2018-10-17 | Discharge: 2018-10-17 | Disposition: A | Discharge status: Home / Self Care | Payer: Medicare Other | Source: Ambulatory Visit | Attending: Interventional Cardiology | Admitting: Interventional Cardiology

## 2018-10-17 ENCOUNTER — Other Ambulatory Visit: Payer: Self-pay

## 2018-10-17 ENCOUNTER — Other Ambulatory Visit: Payer: Medicare Other | Admitting: *Deleted

## 2018-10-17 DIAGNOSIS — Z1159 Encounter for screening for other viral diseases: Secondary | ICD-10-CM | POA: Insufficient documentation

## 2018-10-17 DIAGNOSIS — I2510 Atherosclerotic heart disease of native coronary artery without angina pectoris: Secondary | ICD-10-CM | POA: Diagnosis not present

## 2018-10-17 DIAGNOSIS — Z01812 Encounter for preprocedural laboratory examination: Secondary | ICD-10-CM | POA: Insufficient documentation

## 2018-10-17 LAB — BASIC METABOLIC PANEL
BUN/Creatinine Ratio: 24 (ref 10–24)
BUN: 24 mg/dL (ref 8–27)
CO2: 27 mmol/L (ref 20–29)
Calcium: 9.3 mg/dL (ref 8.6–10.2)
Chloride: 102 mmol/L (ref 96–106)
Creatinine, Ser: 1.01 mg/dL (ref 0.76–1.27)
GFR calc Af Amer: 86 mL/min/{1.73_m2} (ref >59)
GFR calc non Af Amer: 74 mL/min/{1.73_m2} (ref >59)
Glucose: 96 mg/dL (ref 65–99)
Potassium: 4.4 mmol/L (ref 3.5–5.2)
Sodium: 139 mmol/L (ref 134–144)

## 2018-10-17 LAB — CBC
Hematocrit: 39 % (ref 37.5–51.0)
Hemoglobin: 12.8 g/dL — ABNORMAL LOW (ref 13.0–17.7)
MCH: 27.9 pg (ref 26.6–33.0)
MCHC: 32.8 g/dL (ref 31.5–35.7)
MCV: 85 fL (ref 79–97)
Platelets: 259 10*3/uL (ref 150–450)
RBC: 4.59 x10E6/uL (ref 4.14–5.80)
RDW: 13.6 % (ref 11.6–15.4)
WBC: 9.8 10*3/uL (ref 3.4–10.8)

## 2018-10-17 LAB — LIPID PANEL
Chol/HDL Ratio: 1.9 ratio (ref 0.0–5.0)
Cholesterol, Total: 112 mg/dL (ref 100–199)
HDL: 60 mg/dL (ref >39)
LDL Calculated: 38 mg/dL (ref 0–99)
Triglycerides: 72 mg/dL (ref 0–149)
VLDL Cholesterol Cal: 14 mg/dL (ref 5–40)

## 2018-10-17 LAB — SARS CORONAVIRUS 2 (TAT 6-24 HRS): SARS Coronavirus 2: NEGATIVE

## 2018-10-18 ENCOUNTER — Telehealth: Payer: Self-pay

## 2018-10-18 NOTE — Telephone Encounter (Signed)
Reviewed the following instructions with the patient. Patient verbalized understanding and has no questions at this time.  Pt contacted pre-catheterization scheduled at [facility] for: 9:00 am on 10/20/18 Verified arrival time and place: [facility] Main Entrance A at:7:00 am  Covid-19 test date: 10/17/18  No solid food after midnight prior to cath, clear liquids until 5 AM day of procedure. Contrast allergy:No Verified no diabetes medications.None  AM meds can be  taken pre-cath with sip of water including: ASA 81 mg  Patient stated he was also instructed not to take his lasix the morning of the procedure.  Confirmed patient has responsible person to drive home post procedure and observe 24 hours after arriving home: Patient has a responsible person to drive him home after the procedure and observe him for 24 hours after arriving home.       COVID-19 Pre-Screening Questions:  . In the past 7 to 10 days have you had a cough,  shortness of breath, headache, congestion, fever (100 or greater) body aches, chills, sore throat, or sudden loss of taste or sense of smell? No . Have you been around anyone with known Covid 19. No . Have you been around anyone who is awaiting Covid 19 test results in the past 7 to 10 days? No . Have you been around anyone who has been exposed to Covid 19, or has mentioned symptoms of Covid 19 within the past 7 to 10 days?No  If you have any concerns/questions about symptoms patients report during screening (either on the phone or at threshold). Contact the provider seeing the patient or DOD for further guidance.  If neither are available contact a member of the leadership team.

## 2018-10-19 NOTE — H&P (Signed)
Multiple tests performed including nuclear study, noncontrast chest CT, and coronary CTA.  The patient is asymptomatic.  Angiography being done because of the possibility of left main disease.

## 2018-10-20 ENCOUNTER — Other Ambulatory Visit: Payer: Self-pay

## 2018-10-20 ENCOUNTER — Encounter (HOSPITAL_COMMUNITY)
Admission: RE | Disposition: A | Discharge status: Home / Self Care | Payer: Self-pay | Source: Home / Self Care | Attending: Interventional Cardiology

## 2018-10-20 ENCOUNTER — Ambulatory Visit (HOSPITAL_COMMUNITY)
Admission: RE | Admit: 2018-10-20 | Discharge: 2018-10-21 | Disposition: A | Discharge status: Home / Self Care | Payer: Medicare Other | Attending: Interventional Cardiology | Admitting: Interventional Cardiology

## 2018-10-20 ENCOUNTER — Encounter (HOSPITAL_COMMUNITY): Payer: Self-pay | Admitting: Interventional Cardiology

## 2018-10-20 DIAGNOSIS — Z79899 Other long term (current) drug therapy: Secondary | ICD-10-CM | POA: Insufficient documentation

## 2018-10-20 DIAGNOSIS — Z888 Allergy status to other drugs, medicaments and biological substances status: Secondary | ICD-10-CM | POA: Diagnosis not present

## 2018-10-20 DIAGNOSIS — Z8546 Personal history of malignant neoplasm of prostate: Secondary | ICD-10-CM | POA: Insufficient documentation

## 2018-10-20 DIAGNOSIS — R2689 Other abnormalities of gait and mobility: Secondary | ICD-10-CM | POA: Diagnosis not present

## 2018-10-20 DIAGNOSIS — I350 Nonrheumatic aortic (valve) stenosis: Secondary | ICD-10-CM | POA: Diagnosis not present

## 2018-10-20 DIAGNOSIS — I272 Pulmonary hypertension, unspecified: Secondary | ICD-10-CM | POA: Diagnosis not present

## 2018-10-20 DIAGNOSIS — I2510 Atherosclerotic heart disease of native coronary artery without angina pectoris: Secondary | ICD-10-CM | POA: Diagnosis not present

## 2018-10-20 DIAGNOSIS — Z881 Allergy status to other antibiotic agents status: Secondary | ICD-10-CM | POA: Insufficient documentation

## 2018-10-20 DIAGNOSIS — Z791 Long term (current) use of non-steroidal anti-inflammatories (NSAID): Secondary | ICD-10-CM | POA: Diagnosis not present

## 2018-10-20 DIAGNOSIS — I712 Thoracic aortic aneurysm, without rupture: Secondary | ICD-10-CM | POA: Insufficient documentation

## 2018-10-20 DIAGNOSIS — Z7982 Long term (current) use of aspirin: Secondary | ICD-10-CM | POA: Insufficient documentation

## 2018-10-20 DIAGNOSIS — E785 Hyperlipidemia, unspecified: Secondary | ICD-10-CM | POA: Diagnosis not present

## 2018-10-20 DIAGNOSIS — I10 Essential (primary) hypertension: Secondary | ICD-10-CM | POA: Diagnosis present

## 2018-10-20 DIAGNOSIS — K219 Gastro-esophageal reflux disease without esophagitis: Secondary | ICD-10-CM | POA: Diagnosis not present

## 2018-10-20 DIAGNOSIS — D509 Iron deficiency anemia, unspecified: Secondary | ICD-10-CM | POA: Insufficient documentation

## 2018-10-20 DIAGNOSIS — Z6841 Body Mass Index (BMI) 40.0 and over, adult: Secondary | ICD-10-CM | POA: Diagnosis not present

## 2018-10-20 HISTORY — PX: RIGHT HEART CATH AND CORONARY ANGIOGRAPHY: CATH118264

## 2018-10-20 LAB — POCT I-STAT 7, (LYTES, BLD GAS, ICA,H+H)
Acid-Base Excess: 1 mmol/L (ref 0.0–2.0)
Bicarbonate: 26.1 mmol/L (ref 20.0–28.0)
Calcium, Ion: 1.17 mmol/L (ref 1.15–1.40)
HCT: 33 % — ABNORMAL LOW (ref 39.0–52.0)
Hemoglobin: 11.2 g/dL — ABNORMAL LOW (ref 13.0–17.0)
O2 Saturation: 99 %
Potassium: 3.8 mmol/L (ref 3.5–5.1)
Sodium: 143 mmol/L (ref 135–145)
TCO2: 27 mmol/L (ref 22–32)
pCO2 arterial: 44.7 mmHg (ref 32.0–48.0)
pH, Arterial: 7.375 (ref 7.350–7.450)
pO2, Arterial: 128 mmHg — ABNORMAL HIGH (ref 83.0–108.0)

## 2018-10-20 LAB — POCT I-STAT EG7
Acid-Base Excess: 1 mmol/L (ref 0.0–2.0)
Bicarbonate: 27.4 mmol/L (ref 20.0–28.0)
Calcium, Ion: 1.21 mmol/L (ref 1.15–1.40)
HCT: 35 % — ABNORMAL LOW (ref 39.0–52.0)
Hemoglobin: 11.9 g/dL — ABNORMAL LOW (ref 13.0–17.0)
O2 Saturation: 72 %
Potassium: 3.9 mmol/L (ref 3.5–5.1)
Sodium: 143 mmol/L (ref 135–145)
TCO2: 29 mmol/L (ref 22–32)
pCO2, Ven: 49.3 mmHg (ref 44.0–60.0)
pH, Ven: 7.352 (ref 7.250–7.430)
pO2, Ven: 40 mmHg (ref 32.0–45.0)

## 2018-10-20 SURGERY — RIGHT HEART CATH AND CORONARY ANGIOGRAPHY
Anesthesia: LOCAL

## 2018-10-20 MED ORDER — FENTANYL CITRATE (PF) 100 MCG/2ML IJ SOLN
INTRAMUSCULAR | Status: DC | PRN
  Administered 2018-10-20: 25 ug via INTRAVENOUS

## 2018-10-20 MED ORDER — MIDAZOLAM HCL 2 MG/2ML IJ SOLN
INTRAMUSCULAR | Status: AC
  Filled 2018-10-20: qty 2

## 2018-10-20 MED ORDER — HYDRALAZINE HCL 20 MG/ML IJ SOLN
INTRAMUSCULAR | Status: DC | PRN
  Administered 2018-10-20: 10 mg via INTRAVENOUS

## 2018-10-20 MED ORDER — FENTANYL CITRATE (PF) 100 MCG/2ML IJ SOLN
INTRAMUSCULAR | Status: AC
  Filled 2018-10-20: qty 2

## 2018-10-20 MED ORDER — SODIUM CHLORIDE 0.9 % IV SOLN: 250.0000 mL | INTRAVENOUS | Status: DC | PRN

## 2018-10-20 MED ORDER — SODIUM CHLORIDE 0.9% FLUSH: 3.0000 mL | INTRAVENOUS | Status: DC | PRN

## 2018-10-20 MED ORDER — HYDRALAZINE HCL 20 MG/ML IJ SOLN
INTRAMUSCULAR | Status: AC
  Filled 2018-10-20: qty 1

## 2018-10-20 MED ORDER — ASPIRIN 81 MG PO CHEW: 81.0000 mg | CHEWABLE_TABLET | ORAL | Status: DC

## 2018-10-20 MED ORDER — LABETALOL HCL 5 MG/ML IV SOLN: 10.0000 mg | INTRAVENOUS | Status: AC | PRN

## 2018-10-20 MED ORDER — LIDOCAINE HCL (PF) 1 % IJ SOLN
INTRAMUSCULAR | Status: AC
  Filled 2018-10-20: qty 30

## 2018-10-20 MED ORDER — HEPARIN (PORCINE) IN NACL 1000-0.9 UT/500ML-% IV SOLN
INTRAVENOUS | Status: DC | PRN
  Administered 2018-10-20: 500 mL

## 2018-10-20 MED ORDER — HYDRALAZINE HCL 20 MG/ML IJ SOLN: 10.0000 mg | INTRAMUSCULAR | Status: AC | PRN

## 2018-10-20 MED ORDER — SODIUM CHLORIDE 0.9 % WEIGHT BASED INFUSION
3.0000 mL/kg/h | INTRAVENOUS | Status: DC
  Administered 2018-10-20: 3 mL/kg/h via INTRAVENOUS

## 2018-10-20 MED ORDER — SODIUM CHLORIDE 0.9% FLUSH: 3.0000 mL | Freq: Two times a day (BID) | INTRAVENOUS | Status: DC

## 2018-10-20 MED ORDER — SODIUM CHLORIDE 0.9 % IV SOLN: INTRAVENOUS | Status: AC

## 2018-10-20 MED ORDER — MIDAZOLAM HCL 2 MG/2ML IJ SOLN
INTRAMUSCULAR | Status: DC | PRN
  Administered 2018-10-20: 1 mg via INTRAVENOUS

## 2018-10-20 MED ORDER — OXYCODONE HCL 5 MG PO TABS: 5.0000 mg | ORAL_TABLET | ORAL | Status: DC | PRN

## 2018-10-20 MED ORDER — HEPARIN SODIUM (PORCINE) 1000 UNIT/ML IJ SOLN
INTRAMUSCULAR | Status: DC | PRN
  Administered 2018-10-20: 6000 [IU] via INTRAVENOUS

## 2018-10-20 MED ORDER — SODIUM CHLORIDE 0.9 % WEIGHT BASED INFUSION: 1.0000 mL/kg/h | INTRAVENOUS | Status: DC

## 2018-10-20 MED ORDER — ASPIRIN 81 MG PO CHEW
81.0000 mg | CHEWABLE_TABLET | Freq: Every day | ORAL | Status: DC
  Administered 2018-10-21: 11:00:00 81 mg via ORAL
  Filled 2018-10-20: qty 1

## 2018-10-20 MED ORDER — LIDOCAINE HCL (PF) 1 % IJ SOLN
INTRAMUSCULAR | Status: DC | PRN
  Administered 2018-10-20: 5 mL
  Administered 2018-10-20: 2 mL

## 2018-10-20 MED ORDER — VERAPAMIL HCL 2.5 MG/ML IV SOLN
INTRAVENOUS | Status: DC | PRN
  Administered 2018-10-20: 10:00:00 10 mL via INTRA_ARTERIAL

## 2018-10-20 MED ORDER — ONDANSETRON HCL 4 MG/2ML IJ SOLN: 4.0000 mg | Freq: Four times a day (QID) | INTRAMUSCULAR | Status: DC | PRN

## 2018-10-20 MED ORDER — IOHEXOL 350 MG/ML SOLN
INTRAVENOUS | Status: DC | PRN
  Administered 2018-10-20: 11:00:00 80 mL via INTRACARDIAC

## 2018-10-20 MED ORDER — ACETAMINOPHEN 325 MG PO TABS: 650.0000 mg | ORAL_TABLET | ORAL | Status: DC | PRN

## 2018-10-20 MED ORDER — VERAPAMIL HCL 2.5 MG/ML IV SOLN
INTRAVENOUS | Status: AC
  Filled 2018-10-20: qty 2

## 2018-10-20 MED ORDER — HEPARIN SODIUM (PORCINE) 1000 UNIT/ML IJ SOLN
INTRAMUSCULAR | Status: AC
  Filled 2018-10-20: qty 1

## 2018-10-20 MED ORDER — HEPARIN (PORCINE) IN NACL 1000-0.9 UT/500ML-% IV SOLN
INTRAVENOUS | Status: AC
  Filled 2018-10-20: qty 1000

## 2018-10-20 SURGICAL SUPPLY — 14 items
CATH 5FR JL3.5 JR4 ANG PIG MP (CATHETERS) ×1 IMPLANT
CATH BALLN WEDGE 5F 110CM (CATHETERS) ×1 IMPLANT
DEVICE RAD COMP TR BAND LRG (VASCULAR PRODUCTS) ×1 IMPLANT
GLIDESHEATH SLEND A-KIT 6F 22G (SHEATH) ×1 IMPLANT
GUIDEWIRE .025 260CM (WIRE) ×1 IMPLANT
GUIDEWIRE INQWIRE 1.5J.035X260 (WIRE) IMPLANT
INQWIRE 1.5J .035X260CM (WIRE) ×2
KIT HEART LEFT (KITS) ×2 IMPLANT
PACK CARDIAC CATHETERIZATION (CUSTOM PROCEDURE TRAY) ×2 IMPLANT
SHEATH GLIDE SLENDER 4/5FR (SHEATH) ×1 IMPLANT
SHEATH PROBE COVER 6X72 (BAG) ×1 IMPLANT
TRANSDUCER W/STOPCOCK (MISCELLANEOUS) ×2 IMPLANT
TUBING CIL FLEX 10 FLL-RA (TUBING) ×2 IMPLANT
WIRE HI TORQ VERSACORE-J 145CM (WIRE) ×1 IMPLANT

## 2018-10-20 NOTE — Interval H&P Note (Signed)
Cath Lab Visit (complete for each Cath Lab visit)  Clinical Evaluation Leading to the Procedure:   ACS: No.  Non-ACS:    Anginal Classification: CCS II  Anti-ischemic medical therapy: Minimal Therapy (1 class of medications)  Non-Invasive Test Results: Intermediate-risk stress test findings: cardiac mortality 1-3%/year  Prior CABG: No previous CABG      History and Physical Interval Note:  10/20/2018 9:54 AM  Curtis Hill  has presented today for surgery, with the diagnosis of Abnormal CT.  The various methods of treatment have been discussed with the patient and family. After consideration of risks, benefits and other options for treatment, the patient has consented to  Procedure(s): RIGHT/LEFT HEART CATH AND CORONARY ANGIOGRAPHY (N/A) as a surgical intervention.  The patient's history has been reviewed, patient examined, no change in status, stable for surgery.  I have reviewed the patient's chart and labs.  Questions were answered to the patient's satisfaction.     Curtis Hill

## 2018-10-20 NOTE — CV Procedure (Signed)
   Right and left heart catheterization from left brachial and right radial respectively.  Radial access achieved with real-time vascular ultrasound.  Difficult procedure due to obesity, difficulty with fluoroscopic visualization, and subclavian- innominate angulation.  Angiographically normal left main, 40 to 50% mid LAD followed by 65 to 75% mid LAD beyond diagonal #2 [place] 011 anatomy).  Circumflex is normal.  Right coronary artery is normal.  Normal coronary origin.  Moderate pulmonary hypertension.  Elevated pulmonary wedge mean, 20 mmHg.  Secondary prevention with aggressive lipid-lowering, glycemic control, blood pressure control, weight loss, aerobic activity, and screening for sleep apnea.  Guideline directed therapy for angina when or if he ever develops symptoms.

## 2018-10-20 NOTE — Progress Notes (Signed)
Pt admitted from Cath lab, oriented to unit, routines and safety discussed, pt verbalized understanding, family called by patient to update status,

## 2018-10-21 ENCOUNTER — Telehealth: Payer: Self-pay | Admitting: *Deleted

## 2018-10-21 ENCOUNTER — Other Ambulatory Visit: Payer: Self-pay | Admitting: Medical

## 2018-10-21 DIAGNOSIS — E785 Hyperlipidemia, unspecified: Secondary | ICD-10-CM | POA: Diagnosis not present

## 2018-10-21 DIAGNOSIS — I350 Nonrheumatic aortic (valve) stenosis: Secondary | ICD-10-CM | POA: Diagnosis not present

## 2018-10-21 DIAGNOSIS — I272 Pulmonary hypertension, unspecified: Secondary | ICD-10-CM | POA: Diagnosis not present

## 2018-10-21 DIAGNOSIS — I10 Essential (primary) hypertension: Secondary | ICD-10-CM

## 2018-10-21 DIAGNOSIS — I2510 Atherosclerotic heart disease of native coronary artery without angina pectoris: Secondary | ICD-10-CM | POA: Diagnosis not present

## 2018-10-21 DIAGNOSIS — R400 Somnolence: Secondary | ICD-10-CM

## 2018-10-21 MED ORDER — IRBESARTAN 150 MG PO TABS
300.0000 mg | ORAL_TABLET | Freq: Every day | ORAL | Status: DC
  Administered 2018-10-21: 300 mg via ORAL
  Filled 2018-10-21: qty 2

## 2018-10-21 MED ORDER — ATORVASTATIN CALCIUM 10 MG PO TABS: 20.0000 mg | ORAL_TABLET | Freq: Every day | ORAL | Status: DC

## 2018-10-21 MED ORDER — FUROSEMIDE 20 MG PO TABS: 40.0000 mg | ORAL_TABLET | Freq: Every day | ORAL | 3 refills | Status: DC

## 2018-10-21 NOTE — Discharge Instructions (Signed)

## 2018-10-21 NOTE — Discharge Summary (Addendum)
Discharge Summary    Patient ID: Curtis Hill,  MRN: [id number], DOB/AGE: [id number] y.o.  Admit date: 10/20/2018 Discharge date: 10/21/2018  Primary Care Provider: Lawerance Cruel Primary Cardiologist: Fransico Him, MD  Discharge Diagnoses    Principal Problem:   Coronary artery calcification seen on CAT scan Active Problems:   Morbid obesity (HCC)   Benign essential HTN   Aortic stenosis   CAD (coronary artery disease)   Allergies Allergies  Allergen Reactions   Levofloxacin Other (See Comments)    Aortic aneurysm   Lisinopril Shortness Of Breath, Swelling, Rash and Anaphylaxis    Diagnostic Studies/Procedures    Right/Left heart catheterization 10/20/2018:  Angiographically widely patent left main.  Mid LAD 40% and distal LAD 70%.  Normal moderate to large circumflex.  Minimal luminal irregularities noted in a dominant RCA. Distal LV branch contains 50% ostial narrowing  Aortic valve was crossed but hemodynamics could not be measured due to catheter recoil.  Moderate pulmonary hypertension and elevated pulmonary wedge, 20 mmHg. Etiology of pulmonary hypertension is likely multifactorial. Consider sleep apnea. More aggressive diuresis may also help.  RECOMMENDATIONS:   Aggressive secondary risk modification with target LDL less than 50. Glycemic control, blood pressure 130/80 mmHg or less, management of sleep apnea if present, and management of other risk factors as needed.  Currently asymptomatic and therefore no anti-ischemic therapy is recommended.  Consider more aggressive diuresis if blood pressure is recalcitrant. _____________   History of Present Illness     73 y.o. male with a PMH of CAD on CT chest, iron deficiency anemia, prostate cancer, and GERD, with recent extensive cardiac work-up to evaluate SOB, including coronary CTA which showed moderate LM, LAD, LCx, and RCA disease though study was suboptimal given body habitus; CT  chest c/f pulmonary hypertension, and Echocardiogram detailed above. Given concerns for significant LM disease, patient was recommended to undergo a R/LHC to further evaluate coronary anatomy and SOB. He presented for outpatient cath on 10/19/2018 and was subsequently admitted to the hospital for observation.   Hospital Course     Consultants: None   1. Moderate non-obstructive CAD: patient underwent [facility] 10/20/2018 which showed 40% mLAD stenosis, 70% dLAD stenosis, and 50% ostial stenosis, with elevated pulmonary wedge pressures suggestive of moderate Pulm HTN. He was recommended for aggressive risk factor modifications.  - Continue aspirin and statin  2. Pulmonary HTN: moderate on RHC yesterday with pulmonary wedge pressure 61mmHg.  - Will increase lasix to 40mg  daily - Will arrange outpatient sleep study for possible OSA  3. HTN: BP elevated today. Suspect due to home medications being held for catheterization.  - Resume home valsartan at discharge - Will increase lasix to 40mg  daily  4. HLD: LDL well controlled at 38 10/17/2018 - Continue statin  5. Dilated aortic root and ascending aorta: Followed by Dr. Prescott Gum. 40 mm and 45 mm respectively.This was similar to the chest CT of 06/02/2018 showing an  asending aortic dimension of 4.3 cm. - Continue routine monitoring per Dr. Prescott Gum.  6. Suspected OSA: patient with history of morbid obesity, snoring, and daytime somnolence. Also with moderate non-obstructive CAD and pulm HTN on [facility] today.  - Will order an outpatient sleep study to further evaluate OSA.   7. Morbid obesity: discussed importance of weight loss.  - Continue to encourage dietary and lifestyle modifications to promote weight loss  _____________  Discharge Vitals Blood pressure (!) 143/73, pulse 70, temperature 98.1 F (36.7 C),  temperature source Oral, resp. rate 17, height 5\' 10"  (1.778 m), weight (!) 189.6 kg, SpO2 97 %.  Filed Weights   10/20/18 0710  10/21/18 0652  Weight: (!) 176.9 kg (!) 189.6 kg    Labs & Radiologic Studies    CBC Recent Labs    10/20/18 1029 10/20/18 1051  HGB 11.9* 11.2*  HCT 35.0* 36.6*   Basic Metabolic Panel Recent Labs    10/20/18 1029 10/20/18 1051  NA 143 143  K 3.9 3.8   Liver Function Tests No results for input(s): AST, ALT, ALKPHOS, BILITOT, PROT, ALBUMIN in the last 72 hours. No results for input(s): LIPASE, AMYLASE in the last 72 hours. Cardiac Enzymes No results for input(s): CKTOTAL, CKMB, CKMBINDEX, TROPONINI in the last 72 hours. BNP Invalid input(s): POCBNP D-Dimer No results for input(s): DDIMER in the last 72 hours. Hemoglobin A1C No results for input(s): HGBA1C in the last 72 hours. Fasting Lipid Panel No results for input(s): CHOL, HDL, LDLCALC, TRIG, CHOLHDL, LDLDIRECT in the last 72 hours. Thyroid Function Tests No results for input(s): TSH, T4TOTAL, T3FREE, THYROIDAB in the last 72 hours.  Invalid input(s): FREET3 _____________  No results found. Disposition   Patient was seen and examined by Dr. Gwenlyn Found who deemed patient as stable for discharge. Follow-up has been arranged. Discharge medications as listed below.   Follow-up Plans & Appointments    Follow-up Information    Sueanne Margarita, MD Follow up on 11/02/2018.   Specialty: Cardiology Why: Please arrive 15 minutes early for your 9:20am appointment Contact information: [address] [place] [address] [phone]            Discharge Medications   Allergies as of 10/21/2018      Reactions   Levofloxacin Other (See Comments)   Aortic aneurysm   Lisinopril Shortness Of Breath, Swelling, Rash, Anaphylaxis      Medication List    STOP taking these medications   meloxicam 15 MG tablet Commonly known as: MOBIC     TAKE these medications   albuterol 108 (90 Base) MCG/ACT inhaler Commonly known as: VENTOLIN HFA Inhale 2 puffs into the lungs every 6 (six) hours as needed for  wheezing or shortness of breath.   allopurinol 300 MG tablet Commonly known as: ZYLOPRIM Take 300 mg by mouth daily.   aspirin 81 MG tablet Take 81 mg by mouth daily.   atorvastatin 20 MG tablet Commonly known as: LIPITOR Take 1 tablet (20 mg total) by mouth daily. What changed: when to take this   CALCIUM CARBONATE-VITAMIN D PO Take 2 each by mouth daily.   CENTRUM SILVER PO Take 1 tablet by mouth daily at 12 noon.   famotidine-calcium carbonate-magnesium hydroxide 10-800-165 MG chewable tablet Commonly known as: PEPCID COMPLETE Chew 1-2 tablets by mouth daily as needed (for acid reflux).   furosemide 20 MG tablet Commonly known as: LASIX Take 2 tablets (40 mg total) by mouth daily. What changed: how much to take   Krill Oil 1000 MG Caps Take 1,000 mg by mouth daily at 12 noon.   loratadine 10 MG tablet Commonly known as: CLARITIN Take 10 mg by mouth daily at 12 noon.   MAGNESIUM PO Take 380 mg by mouth every evening.   traMADol 50 MG tablet Commonly known as: ULTRAM Take 50 mg by mouth every 8 (eight) hours as needed for moderate pain.   Ubiquinol 200 MG Caps Take 200 mg by mouth daily at 12 noon.  valsartan 320 MG tablet Commonly known as: DIOVAN Take 320 mg by mouth daily.   Vitamin B-12 5000 MCG Tbdp Take 5,000 mcg by mouth daily at 12 noon.   Vitamin D (Ergocalciferol) 50 MCG (2000 UT) Caps Take 2,000 Units by mouth daily at 12 noon.          Outstanding Labs/Studies   None  Duration of Discharge Encounter   Greater than 30 minutes including physician time.  Signed, Abigail Butts PA-C 10/21/2018, 9:59 AM   Agree with assessment and plan by Roby Lofts PA-C  Stable for DC home. Non critical CAD Mild Pulm HTN. F/U with DR Radford Pax.  Lorretta Harp, M.D., [place], [facility], [place] [address] [place], [place]  [address]  [phone] 10/21/2018 2:58 PM

## 2018-10-21 NOTE — Telephone Encounter (Signed)
-----   Message from Abigail Butts, PA-C sent at 10/21/2018  9:51 AM EDT ----- Regarding: Outpatient sleep study Stark Bray! Can you help get this patient arranged for a sleep study to evaluate for suspected OSA. He is morbidly obese, snores, and reports daytime somnolence. Please let me know if there are any orders I need to place (I looked but wasn't sure what to order!).   Thanks for your help!  Daleen Snook

## 2018-10-21 NOTE — Evaluation (Signed)
Physical Therapy Evaluation and Discharge Patient Details Name: Curtis Hill MRN: [id number] DOB: [birth year] Today's Date: 10/21/2018   History of Present Illness  73 y.o. male with with a history of iron deficiency anemia, prostate cancer, GERD with hiatal hernia, hypertension who recently underwent a CT of the chest with contrast to evaluate shortness of breath and rule out PE.  Chest CT showed no evidence of PE but did show cardiomegaly and possible pulmonary hypertension with enlargement of the main pulmonary artery. s/p 10/20/2018 Heart catheterization. Kept overnight as Outpatient in Bed.   Clinical Impression  PTA pt living with wife in multistory home with 4 steps to enter. Pt owns a small business and is independent in ambulation with either RW or bilateral canes. Pt is limited in safe mobility by panis which requires bilateral UE support to be able to advance, as well as decreased endurance. This is not new and pt is at his baseline level of function being mod I in bed mobility and supervision with transfers and ambulation. No PT follow up is required at this time and pt has equipment that he needs. Pt is scheduled for d/c today.     Follow Up Recommendations No PT follow up;Supervision for mobility/OOB    Equipment Recommendations  None recommended by PT    Recommendations for Other Services       Precautions / Restrictions Precautions Precautions: None Restrictions Weight Bearing Restrictions: Yes RUE Weight Bearing: Non weight bearing Other Position/Activity Restrictions: s/p heart cath      Mobility  Bed Mobility Overal bed mobility: Modified Independent             General bed mobility comments: increased time and effort  Transfers Overall transfer level: Needs assistance   Transfers: Sit to/from Stand Sit to Stand: Supervision         General transfer comment: supervision for safety, good power up and steadying  Ambulation/Gait Ambulation/Gait  assistance: Supervision Gait Distance (Feet): 15 Feet Assistive device: Rolling walker (2 wheeled) Gait Pattern/deviations: Step-through pattern;Decreased step length - right;Decreased step length - left;Shuffle;Trunk flexed Gait velocity: slowed Gait velocity interpretation: <1.31 ft/sec, indicative of household ambulator General Gait Details: supervision for safety with slow, waddling gait to manage around pannis       Balance Overall balance assessment: Needs assistance Sitting-balance support: Feet supported;No upper extremity supported Sitting balance-Leahy Scale: Good     Standing balance support: No upper extremity supported;During functional activity Standing balance-Leahy Scale: Fair                               Pertinent Vitals/Pain Pain Assessment: No/denies pain    Home Living Family/patient expects to be discharged to:: Private residence Living Arrangements: Spouse/significant other;Children Available Help at Discharge: Family;Available 24 hours/day Type of Home: House Home Access: Stairs to enter Entrance Stairs-Rails: Left Entrance Stairs-Number of Steps: 4 Home Layout: Two level;Able to live on main level with bedroom/bathroom Home Equipment: Gilford Rile - 2 wheels;Cane - single point      Prior Function Level of Independence: Independent with assistive device(s)               Hand Dominance        Extremity/Trunk Assessment   Upper Extremity Assessment Upper Extremity Assessment: Overall WFL for tasks assessed    Lower Extremity Assessment Lower Extremity Assessment: Overall WFL for tasks assessed;RLE deficits/detail;LLE deficits/detail RLE Deficits / Details: ROM limited by body habitus LLE Deficits /  Details: ROM limited by body habitus       Communication      Cognition Arousal/Alertness: Awake/alert Behavior During Therapy: WFL for tasks assessed/performed Overall Cognitive Status: Within Functional Limits for tasks  assessed                                        General Comments General comments (skin integrity, edema, etc.): Pt limited in mobility by low hanging panis which impedes ambulation requiring BUE to lean forward to advance. VSS    Exercises      Assessment/Plan    PT Assessment Patent does not need any further PT services  PT Problem List         PT Treatment Interventions      PT Goals (Current goals can be found in the Care Plan section)  Acute Rehab PT Goals Patient Stated Goal: go home and sleep PT Goal Formulation: With patient Potential to Achieve Goals: Good    Frequency     Barriers to discharge        Co-evaluation               AM-PAC PT "6 Clicks" Mobility  Outcome Measure Help needed turning from your back to your side while in a flat bed without using bedrails?: A Little Help needed moving from lying on your back to sitting on the side of a flat bed without using bedrails?: None Help needed moving to and from a bed to a chair (including a wheelchair)?: None Help needed standing up from a chair using your arms (e.g., wheelchair or bedside chair)?: None Help needed to walk in hospital room?: None Help needed climbing 3-5 steps with a railing? : A Little 6 Click Score: 22    End of Session   Activity Tolerance: Patient tolerated treatment well Patient left: in chair;with call bell/phone within reach Nurse Communication: Mobility status PT Visit Diagnosis: Other abnormalities of gait and mobility (R26.89)    Time: 7616-0737 PT Time Calculation (min) (ACUTE ONLY): 20 min   Charges:   PT Evaluation $PT Eval Moderate Complexity: 1 Mod          Amoni Morales B. Migdalia Dk PT, DPT Acute Rehabilitation Services Pager [phone] Office [phone]   [place] 10/21/2018, 10:47 AM

## 2018-10-24 ENCOUNTER — Other Ambulatory Visit: Payer: Self-pay | Admitting: Medical

## 2018-10-24 ENCOUNTER — Telehealth: Payer: Self-pay | Admitting: *Deleted

## 2018-10-24 DIAGNOSIS — R400 Somnolence: Secondary | ICD-10-CM

## 2018-10-24 NOTE — Telephone Encounter (Signed)
-----   Message from Freada Bergeron, [place] sent at 10/21/2018  1:43 PM EDT ----- Regarding: RE: Outpatient sleep study Precert  ----- Message ----- From: Abigail Butts., PA-C Sent: 10/21/2018   9:51 AM EDT To: Freada Bergeron, CMA Subject: Outpatient sleep study                         Stark Bray! Can you help get this patient arranged for a sleep study to evaluate for suspected OSA. He is morbidly obese, snores, and reports daytime somnolence. Please let me know if there are any orders I need to place (I looked but wasn't sure what to order!).   Thanks for your help!  Daleen Snook

## 2018-10-24 NOTE — Telephone Encounter (Signed)
Staff message sent to Gae Bon ok to schedule sleep study. Patient has Medicare and does not require getting a pre cert.

## 2018-10-24 NOTE — Addendum Note (Signed)
Addended by: Roby Lofts on: 10/24/2018 01:19 PM   Modules accepted: Orders

## 2018-10-25 ENCOUNTER — Telehealth: Payer: Self-pay

## 2018-10-25 DIAGNOSIS — I10 Essential (primary) hypertension: Secondary | ICD-10-CM

## 2018-10-25 DIAGNOSIS — I2510 Atherosclerotic heart disease of native coronary artery without angina pectoris: Secondary | ICD-10-CM

## 2018-10-25 NOTE — Telephone Encounter (Signed)
-----   Message from Sueanne Margarita, MD sent at 10/23/2018  4:16 PM EDT ----- Thanks Hank!  Ben,  Patient has diastolic CHF by cath with elevated filling pressures.  Please start on Lasix 20mg  daily and get a BMET in 1 week.  Have him followup with PA in 2 weeks.  Traci ----- Message ----- From: Belva Crome, MD Sent: 10/20/2018   9:35 PM EDT To: Sueanne Margarita, MD  Please see results.

## 2018-10-25 NOTE — Telephone Encounter (Signed)
Spoke with the patient, he has been taking Lasix 40 mg, daily, he started on 10/23/18. This was indicated on his discharge. He is scheduled for Dr. Radford Pax on 11/02/18 for his 2 week follow up.

## 2018-10-27 ENCOUNTER — Telehealth: Payer: Self-pay | Admitting: *Deleted

## 2018-10-27 NOTE — Telephone Encounter (Signed)
-----   Message from Lauralee Evener, [place] sent at 10/24/2018  3:08 PM EDT ----- Regarding: RE: Outpatient sleep study Patient has Medicare and does not require a pre cert. Ok to schedule. ----- Message ----- From: Freada Bergeron, CMA Sent: 10/21/2018   1:43 PM EDT To: [facility] Subject: RE: Outpatient sleep study                     Precert  ----- Message ----- From: Abigail Butts., PA-C Sent: 10/21/2018   9:51 AM EDT To: Freada Bergeron, CMA Subject: Outpatient sleep study                         Stark Bray! Can you help get this patient arranged for a sleep study to evaluate for suspected OSA. He is morbidly obese, snores, and reports daytime somnolence. Please let me know if there are any orders I need to place (I looked but wasn't sure what to order!).   Thanks for your help!  Daleen Snook

## 2018-10-27 NOTE — Telephone Encounter (Signed)
Patient is scheduled for lab study on 10/31/18. Patient understands his sleep study will be done at [facility]. Patient understands he will receive a sleep packet in a week or so. Patient understands to call if he does not receive the sleep packet in a timely manner. He is scheduled for COVID screening on 7/17 prior to study.  Patient agrees with treatment and thanked me for call.

## 2018-10-28 ENCOUNTER — Other Ambulatory Visit (HOSPITAL_COMMUNITY): Payer: Medicare Other

## 2018-10-31 ENCOUNTER — Ambulatory Visit (HOSPITAL_BASED_OUTPATIENT_CLINIC_OR_DEPARTMENT_OTHER): Payer: Medicare Other

## 2018-11-01 ENCOUNTER — Other Ambulatory Visit: Payer: Self-pay

## 2018-11-01 ENCOUNTER — Other Ambulatory Visit: Payer: Medicare Other

## 2018-11-01 DIAGNOSIS — I2510 Atherosclerotic heart disease of native coronary artery without angina pectoris: Secondary | ICD-10-CM

## 2018-11-01 DIAGNOSIS — I10 Essential (primary) hypertension: Secondary | ICD-10-CM

## 2018-11-01 LAB — BASIC METABOLIC PANEL
BUN/Creatinine Ratio: 27 — ABNORMAL HIGH (ref 10–24)
BUN: 24 mg/dL (ref 8–27)
CO2: 22 mmol/L (ref 20–29)
Calcium: 9.3 mg/dL (ref 8.6–10.2)
Chloride: 99 mmol/L (ref 96–106)
Creatinine, Ser: 0.9 mg/dL (ref 0.76–1.27)
GFR calc Af Amer: 98 mL/min/{1.73_m2} (ref >59)
GFR calc non Af Amer: 85 mL/min/{1.73_m2} (ref >59)
Glucose: 99 mg/dL (ref 65–99)
Potassium: 4.3 mmol/L (ref 3.5–5.2)
Sodium: 138 mmol/L (ref 134–144)

## 2018-11-02 ENCOUNTER — Telehealth (INDEPENDENT_AMBULATORY_CARE_PROVIDER_SITE_OTHER): Payer: Medicare Other | Admitting: Cardiology

## 2018-11-02 ENCOUNTER — Encounter: Payer: Self-pay | Admitting: Cardiology

## 2018-11-02 VITALS — BP 120/68

## 2018-11-02 DIAGNOSIS — I712 Thoracic aortic aneurysm, without rupture, unspecified: Secondary | ICD-10-CM

## 2018-11-02 DIAGNOSIS — E7800 Pure hypercholesterolemia, unspecified: Secondary | ICD-10-CM

## 2018-11-02 DIAGNOSIS — I272 Pulmonary hypertension, unspecified: Secondary | ICD-10-CM | POA: Diagnosis not present

## 2018-11-02 DIAGNOSIS — I350 Nonrheumatic aortic (valve) stenosis: Secondary | ICD-10-CM

## 2018-11-02 DIAGNOSIS — I10 Essential (primary) hypertension: Secondary | ICD-10-CM

## 2018-11-02 DIAGNOSIS — E785 Hyperlipidemia, unspecified: Secondary | ICD-10-CM

## 2018-11-02 DIAGNOSIS — I2510 Atherosclerotic heart disease of native coronary artery without angina pectoris: Secondary | ICD-10-CM | POA: Diagnosis not present

## 2018-11-02 HISTORY — DX: Pulmonary hypertension, unspecified: I27.20
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
## 2018-11-04 ENCOUNTER — Telehealth: Payer: Self-pay | Admitting: *Deleted

## 2018-11-04 DIAGNOSIS — G4733 Obstructive sleep apnea (adult) (pediatric): Secondary | ICD-10-CM

## 2018-11-04 DIAGNOSIS — I272 Pulmonary hypertension, unspecified: Secondary | ICD-10-CM

## 2018-11-04 NOTE — Telephone Encounter (Addendum)
-----   Message ----- From: Dollene Primrose, RN Sent: 11/02/2018   9:46 AM EDT To: Freada Bergeron, CMA   Ola Spurr,  I cancelled the in lab and ordered the Itamar sleep study. Dr. Radford Pax said to send to you to set patient up.  Thanks!  Lorren ----- Message ----- From: Sueanne Margarita, MD Sent: 11/02/2018   9:04 AM EDT To: Dollene Primrose, RN  Cancel in lab sleeps study and send to Gershon Cull to order Itamar home sleep study.  Followup with me in 6 months virtual

## 2018-11-09 ENCOUNTER — Telehealth: Payer: Self-pay | Admitting: *Deleted

## 2018-11-09 NOTE — Telephone Encounter (Signed)
Patient is aware and agreeable to Home Sleep Study through [facility]. Patient is aware that [facility] will be contacting them. Patient is agreeable to treatment and thankful for call. All paperwork sent to [facility] via fax at [phone].

## 2018-11-09 NOTE — Telephone Encounter (Addendum)
-----   Message ----- From: Freada Bergeron, CMA Sent: 11/02/2018   6:37 PM EDT To: Dollene Primrose, RN  Can we talk about this Thursday for better understanding? Please?   [phone] ----- Message ----- From: Dollene Primrose, RN Sent: 11/02/2018   9:46 AM EDT To: Freada Bergeron, CMA   Ola Spurr,  I cancelled the in lab and ordered the Itamar sleep study. Dr. Radford Pax said to send to you to set patient up.  Thanks!  Lorren ----- Message ----- From: Sueanne Margarita, MD Sent: 11/02/2018   9:04 AM EDT To: Dollene Primrose, RN  Cancel in lab sleeps study and send to Gershon Cull to order Itamar home sleep study.  Followup with me in 6 months virtual

## 2018-11-09 NOTE — Addendum Note (Signed)
Addended by: Janan Halter F on: 11/09/2018 02:57 PM   Modules accepted: Orders

## 2018-11-09 NOTE — Telephone Encounter (Signed)
Patient Name: Curtis Hill        DOB: Feb 21, 1946      Height:     Weight:  Office Name:         Referring Provider:  Today's Date:  Date:   STOP BANG RISK ASSESSMENT S (snore) Have you been told that you snore?     YES/NO   T (tired) Are you often tired, fatigued, or sleepy during the day?   YES/NO  O (obstruction) Do you stop breathing, choke, or gasp during sleep? YES/NO   P (pressure) Do you have or are you being treated for high blood pressure? YES/NO   B (BMI) Is your body index greater than 35 kg/m? YES/NO   A (age) Are you 65 years old or older? YES/NO   N (neck) Do you have a neck circumference greater than 16 inches?   YES/NO   G (gender) Are you a male? YES/NO   TOTAL STOP/BANG "YES" ANSWERS                                                                        For Office Use Only              Procedure Order Form    YES to 3+ Stop Bang questions OR two clinical symptoms - patient qualifies for WatchPAT (CPT 95800)     Submit: This Form + Patient Face Sheet + Clinical Note via CloudPAT or Fax: [phone]         Clinical Notes: Will consult Sleep Specialist and refer for management of therapy due to patient increased risk of Sleep Apnea. Ordering a sleep study due to the following two clinical symptoms: Excessive daytime sleepiness G47.10 / Gastroesophageal reflux K21.9 / Nocturia R35.1 / Morning Headaches G44.221 / Difficulty concentrating R41.840 / Memory problems or poor judgment G31.84 / Personality changes or irritability R45.4 / Loud snoring R06.83 / Depression F32.9 / Unrefreshed by sleep G47.8 / Impotence N52.9 / History of high blood pressure R03.0 / Insomnia G47.00    I understand that I am proceeding with a home sleep apnea test as ordered by my treating physician. I understand that untreated sleep apnea is a serious cardiovascular risk factor and it is my responsibility to perform the test and seek management for sleep apnea. I will be contacted with  the results and be managed for sleep apnea by a local sleep physician. I will be receiving equipment and further instructions from [facility]. I shall promptly ship back the equipment via the included mailing label. I understand my insurance will be billed for the test and as the patient I am responsible for any insurance related out-of-pocket costs incurred. I have been provided with written instructions and can call for additional video or telephonic instruction, with 24-hour availability of qualified personnel to answer any questions: Patient Help Desk [phone].  Patient Signature ______________________________________________________   Date______________________ Patient Telemedicine Verbal Consent

## 2018-11-09 NOTE — Telephone Encounter (Signed)
Patient Name: Curtis Hill  Height: 2'45"    YKDXIP:[id number]  Office [place]         Referring Provider: Fransico Him  Today's Date:11/09/18  Date:   STOP BANG RISK ASSESSMENT S (snore) Have you been told that you snore?     YES  T (tired) Are you often tired, fatigued, or sleepy during the day?   NO  O (obstruction) Do you stop breathing, choke, or gasp during sleep? NO   P (pressure) Do you have or are you being treated for high blood pressure? YES   B (BMI) Is your body index greater than 35 kg/m? YES   A (age) Are you 73 years old or older? YES   N (neck) Do you have a neck circumference greater than 16 inches?   YES   G (gender) Are you a male? YES   TOTAL STOP/BANG "YES" ANSWERS                                                                        For Office Use Only              Procedure Order Form    YES to 3+ Stop Bang questions OR two clinical symptoms - patient qualifies for WatchPAT (CPT 95800)      Submit: This Form + Patient Face Sheet + Clinical Note via CloudPAT or Fax: [phone]         Clinical Notes: Will consult Sleep Specialist and refer for management of therapy due to patient increased risk of Sleep Apnea. Ordering a sleep study due to the following two clinical symptoms: Excessive daytime sleepiness G47.10 / Gastroesophageal reflux K21.9 / Nocturia R35.1 / Morning Headaches G44.221 / Difficulty concentrating R41.840 / Memory problems or poor judgment G31.84 / Personality changes or irritability R45.4 / Loud snoring R06.83 / Depression F32.9 / Unrefreshed by sleep G47.8 / Impotence N52.9 / History of high blood pressure R03.0 / Insomnia G47.00    I understand that I am proceeding with a home sleep apnea test as ordered by my treating physician. I understand that untreated sleep apnea is a serious cardiovascular risk factor and it is my responsibility to perform the test and seek management for sleep apnea. I will be contacted with the results  and be managed for sleep apnea by a local sleep physician. I will be receiving equipment and further instructions from [facility]. I shall promptly ship back the equipment via the included mailing label. I understand my insurance will be billed for the test and as the patient I am responsible for any insurance related out-of-pocket costs incurred. I have been provided with written instructions and can call for additional video or telephonic instruction, with 24-hour availability of qualified personnel to answer any questions: Patient Help Desk [phone].  Patient Signature [name] _______________________________________________   Date___7/29/20___________________ Patient Telemedicine Verbal Consent

## 2018-11-15 DIAGNOSIS — C61 Malignant neoplasm of prostate: Secondary | ICD-10-CM | POA: Diagnosis not present

## 2018-11-15 DIAGNOSIS — N3000 Acute cystitis without hematuria: Secondary | ICD-10-CM | POA: Diagnosis not present

## 2018-11-16 ENCOUNTER — Ambulatory Visit (INDEPENDENT_AMBULATORY_CARE_PROVIDER_SITE_OTHER): Payer: Medicare Other | Admitting: Cardiothoracic Surgery

## 2018-11-16 ENCOUNTER — Other Ambulatory Visit: Payer: Self-pay

## 2018-11-16 ENCOUNTER — Encounter: Payer: Self-pay | Admitting: Cardiothoracic Surgery

## 2018-11-16 ENCOUNTER — Ambulatory Visit
Admission: RE | Admit: 2018-11-16 | Discharge: 2018-11-16 | Disposition: A | Discharge status: Home / Self Care | Payer: Medicare Other | Source: Ambulatory Visit | Attending: Cardiothoracic Surgery | Admitting: Cardiothoracic Surgery

## 2018-11-16 VITALS — BP 112/70 | HR 114 | Temp 97.7°F | Resp 16 | Ht 70.0 in | Wt >= 6400 oz

## 2018-11-16 DIAGNOSIS — I2510 Atherosclerotic heart disease of native coronary artery without angina pectoris: Secondary | ICD-10-CM

## 2018-11-16 DIAGNOSIS — I712 Thoracic aortic aneurysm, without rupture, unspecified: Secondary | ICD-10-CM

## 2018-11-16 MED ORDER — IOPAMIDOL (ISOVUE-370) INJECTION 76%
75.0000 mL | Freq: Once | INTRAVENOUS | Status: AC | PRN
  Administered 2018-11-16: 75 mL via INTRAVENOUS
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
## 2018-12-07 ENCOUNTER — Other Ambulatory Visit: Payer: Self-pay | Admitting: Cardiology

## 2018-12-07 MED ORDER — ATORVASTATIN CALCIUM 20 MG PO TABS: 20.0000 mg | ORAL_TABLET | Freq: Every day | ORAL | 3 refills | Status: DC

## 2018-12-23 ENCOUNTER — Telehealth: Payer: Self-pay

## 2018-12-23 DIAGNOSIS — M109 Gout, unspecified: Secondary | ICD-10-CM | POA: Diagnosis not present

## 2018-12-23 DIAGNOSIS — M549 Dorsalgia, unspecified: Secondary | ICD-10-CM | POA: Diagnosis not present

## 2018-12-23 DIAGNOSIS — R7301 Impaired fasting glucose: Secondary | ICD-10-CM | POA: Diagnosis not present

## 2018-12-23 DIAGNOSIS — R252 Cramp and spasm: Secondary | ICD-10-CM | POA: Diagnosis not present

## 2018-12-23 DIAGNOSIS — Z23 Encounter for immunization: Secondary | ICD-10-CM | POA: Diagnosis not present

## 2018-12-23 DIAGNOSIS — I10 Essential (primary) hypertension: Secondary | ICD-10-CM | POA: Diagnosis not present

## 2018-12-23 DIAGNOSIS — J4520 Mild intermittent asthma, uncomplicated: Secondary | ICD-10-CM | POA: Diagnosis not present

## 2018-12-23 DIAGNOSIS — Z0000 Encounter for general adult medical examination without abnormal findings: Secondary | ICD-10-CM | POA: Diagnosis not present

## 2018-12-23 DIAGNOSIS — Z862 Personal history of diseases of the blood and blood-forming organs and certain disorders involving the immune mechanism: Secondary | ICD-10-CM | POA: Diagnosis not present

## 2018-12-23 DIAGNOSIS — I700 Atherosclerosis of aorta: Secondary | ICD-10-CM | POA: Diagnosis not present

## 2018-12-23 NOTE — Telephone Encounter (Signed)
Per report received from vendor pt has a bladder infection which has delayed completion of sleep study.  Plans to complete within 2 weeks.

## 2018-12-28 DIAGNOSIS — Z8546 Personal history of malignant neoplasm of prostate: Secondary | ICD-10-CM | POA: Diagnosis not present

## 2018-12-28 DIAGNOSIS — M109 Gout, unspecified: Secondary | ICD-10-CM | POA: Diagnosis not present

## 2018-12-28 DIAGNOSIS — I10 Essential (primary) hypertension: Secondary | ICD-10-CM | POA: Diagnosis not present

## 2018-12-28 DIAGNOSIS — Z23 Encounter for immunization: Secondary | ICD-10-CM | POA: Diagnosis not present

## 2018-12-28 DIAGNOSIS — Z0000 Encounter for general adult medical examination without abnormal findings: Secondary | ICD-10-CM | POA: Diagnosis not present

## 2018-12-28 DIAGNOSIS — R252 Cramp and spasm: Secondary | ICD-10-CM | POA: Diagnosis not present

## 2018-12-28 DIAGNOSIS — J4520 Mild intermittent asthma, uncomplicated: Secondary | ICD-10-CM | POA: Diagnosis not present

## 2018-12-28 DIAGNOSIS — I700 Atherosclerosis of aorta: Secondary | ICD-10-CM | POA: Diagnosis not present

## 2018-12-28 DIAGNOSIS — R7301 Impaired fasting glucose: Secondary | ICD-10-CM | POA: Diagnosis not present

## 2018-12-28 DIAGNOSIS — Z862 Personal history of diseases of the blood and blood-forming organs and certain disorders involving the immune mechanism: Secondary | ICD-10-CM | POA: Diagnosis not present

## 2018-12-28 DIAGNOSIS — M549 Dorsalgia, unspecified: Secondary | ICD-10-CM | POA: Diagnosis not present

## 2019-01-20 IMAGING — CT CT HEART SCORING
2 series · 16 of 20 positions shown, 18 images · non-contrast
Comparison: CT of the chest on 10/25/2017

Addendum:
EXAM:
OVER-READ INTERPRETATION  CT CHEST

The following report is an over-read performed by radiologist Dr.
Dleke Tiger [REDACTED] on 04/07/2018. This
over-read does not include interpretation of cardiac or coronary
anatomy or pathology. The coronary calcium score interpretation by
the cardiologist is attached.
CLINICAL DATA: Risk stratification
Coronary Calcium Score
TECHNIQUE: The patient was scanned on a Siemens Somatom 64 slice scanner. Axial
non-contrast 3 mm slices were carried out through the heart. The
data set was analyzed on a dedicated work station and scored using
the Agatson method.

[Series 3: casc 3.0 i36f 2 bestdiast 73 % · axial · 0.49mm/px · z∈[+1334,+1418]mm · 8 of 37 slices shown, 10 images]
[im 5/37  vessel]
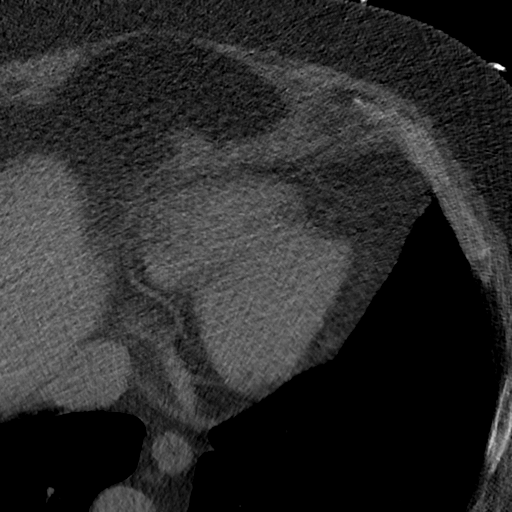
[im 5/37  lung]
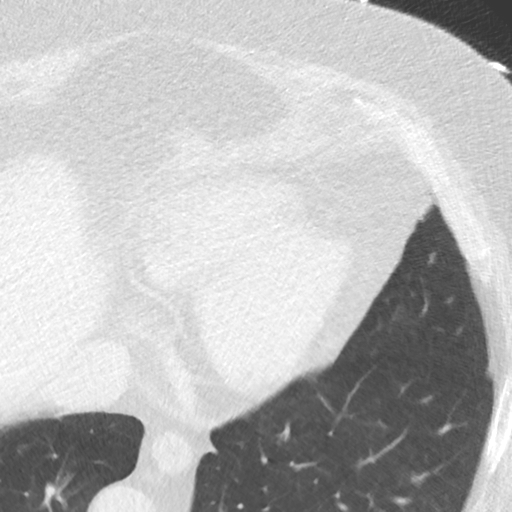
[im 9/37  vessel]
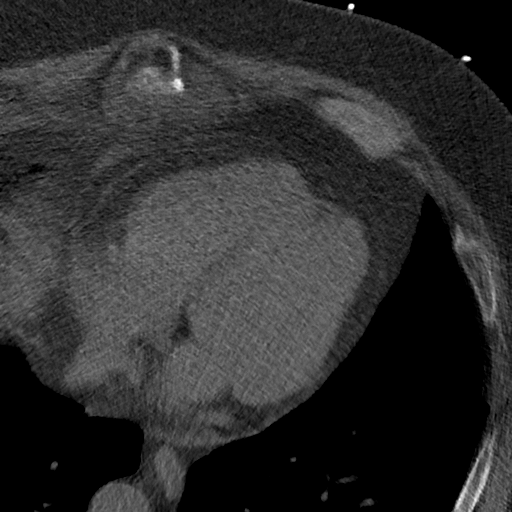
[im 13/37  vessel]
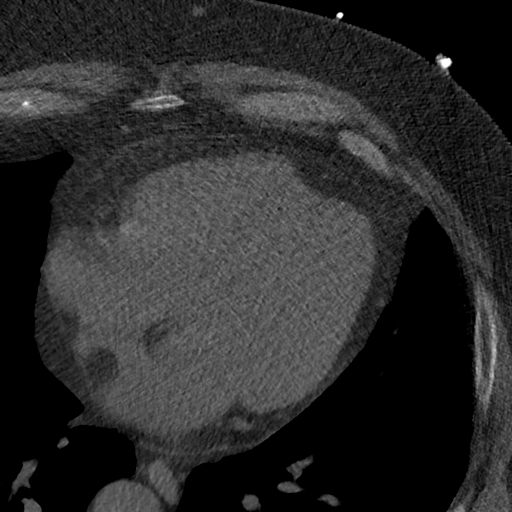
[im 17/37  vessel]
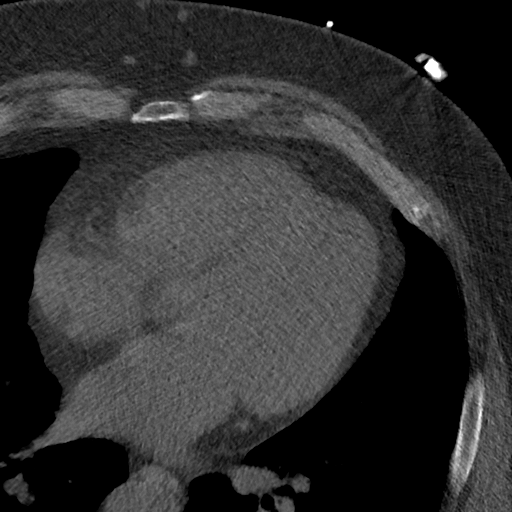
[im 21/37  vessel]
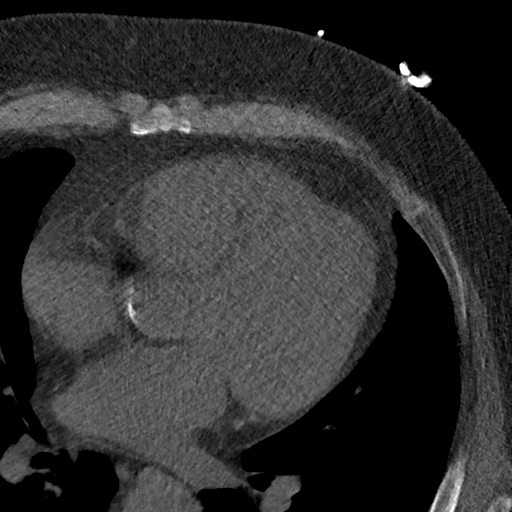
[im 21/37  lung]
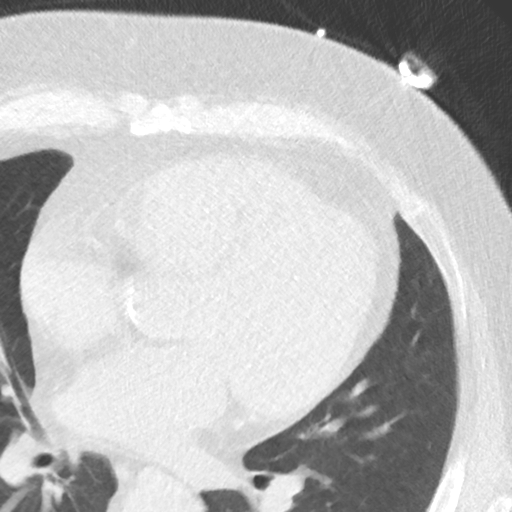
[im 25/37  vessel]
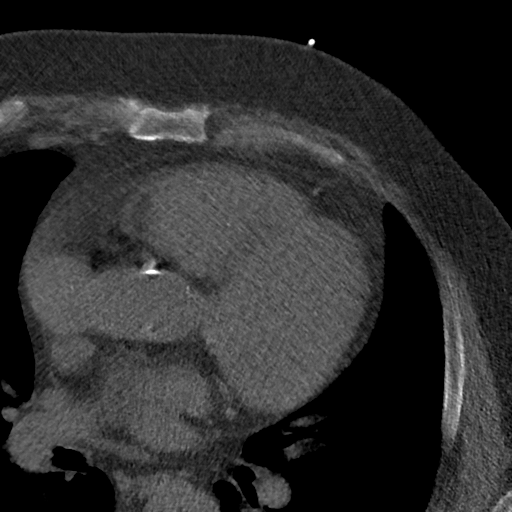
[im 29/37  vessel]
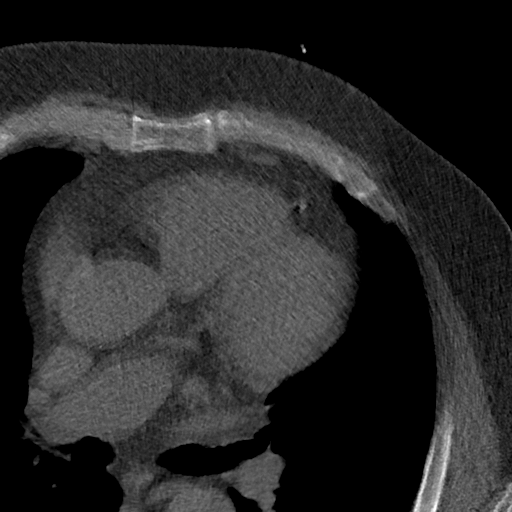
[im 33/37  vessel]
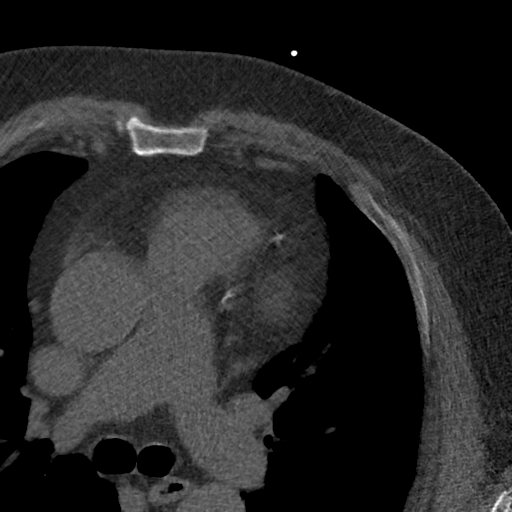

[Series 5: lung st 73 % · axial · 0.78mm/px · z∈[+1334,+1418]mm · 8 of 38 slices shown]
[im 5/38  lung]
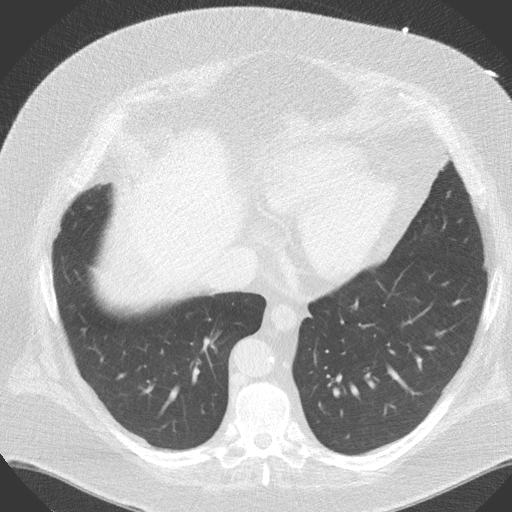
[im 9/38  lung]
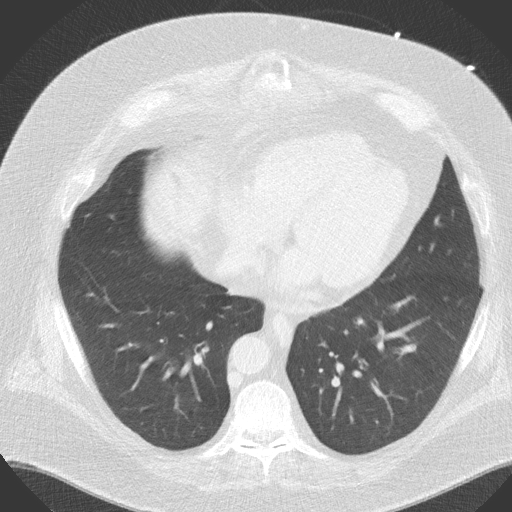
[im 13/38  lung]
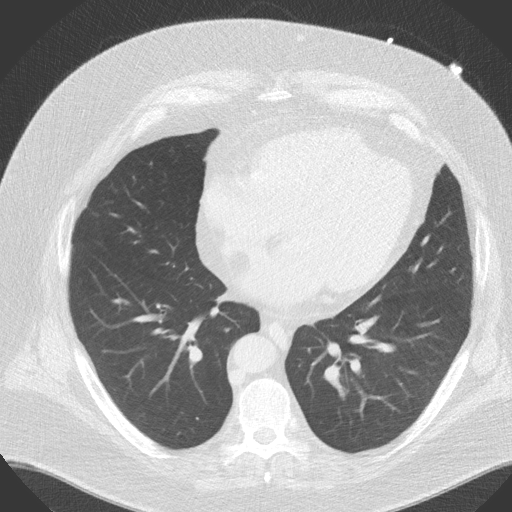
[im 17/38  lung]
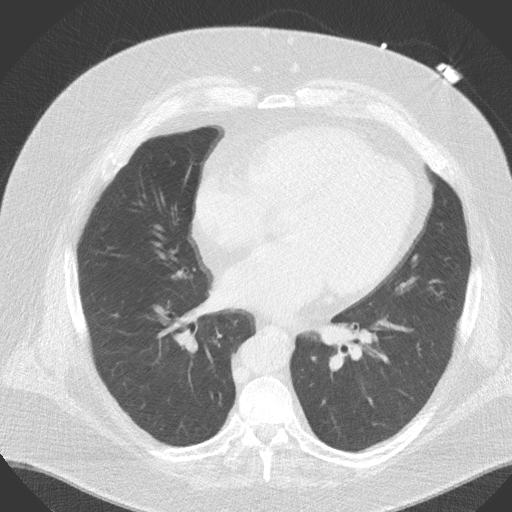
[im 21/38  lung]
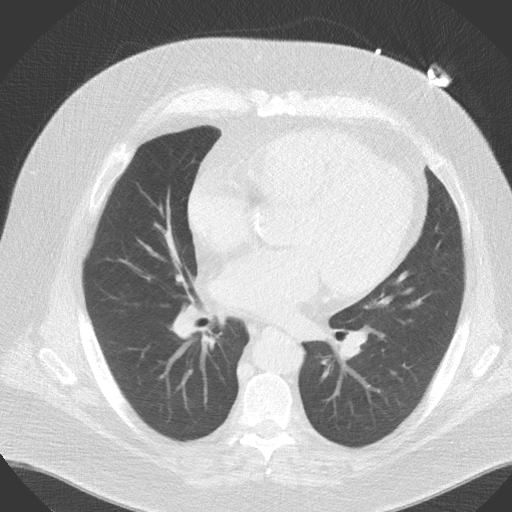
[im 25/38  lung]
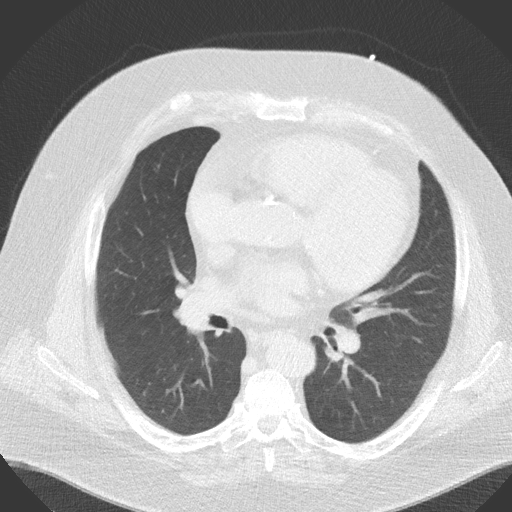
[im 29/38  lung]
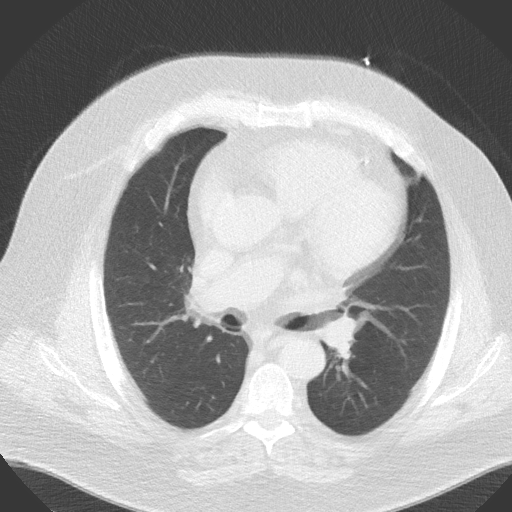
[im 33/38  lung]
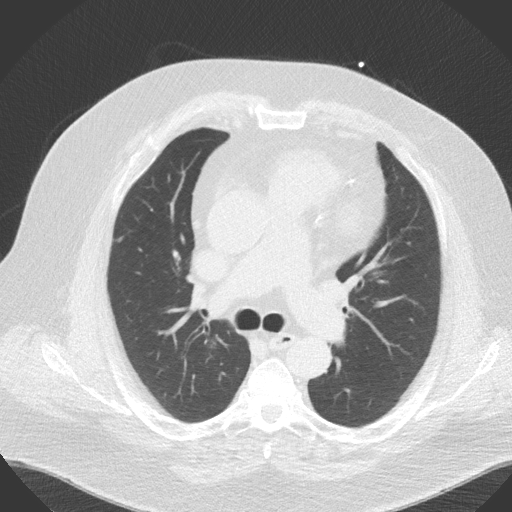

[16 of 20 positions shown; findings below may reference images not displayed]

FINDINGS: Vascular: Note again made of dilated ascending thoracic aorta
measuring up to 4.5 cm. Stable prominent caliber of central
pulmonary arteries.

Mediastinum/Nodes: Visualized mediastinum shows no lymphadenopathy
or mass.

Lungs/Pleura: Visualized lungs show no evidence of pulmonary edema,
consolidation, pneumothorax, nodule or pleural fluid.

Upper Abdomen: No acute abnormality.

Musculoskeletal: No chest wall mass or suspicious bone lesions
identified.
IMPRESSION: Stable evidence of dilated ascending thoracic aorta measuring up to
4.5 cm in diameter. Stable dilated central pulmonary arteries
suggestive of underlying pulmonary hypertension.
FINDINGS: Non-cardiac: See separate report from [REDACTED].

Ascending aorta: Dilated 4.7 cm with Aortic Valve calcification

Pericardium: Normal

Coronary arteries: 3 vessel coronary calcium noted
IMPRESSION: Coronary calcium score of 439. This was 68 th percentile for age and
sex matched control.

Calcified aortic valve with dilated root suggest f/u with echo and
possibly CTA

Philo Murawski

*** End of Addendum ***

## 2019-01-23 ENCOUNTER — Other Ambulatory Visit: Payer: Self-pay | Admitting: Gastroenterology

## 2019-01-27 DIAGNOSIS — Z85820 Personal history of malignant melanoma of skin: Secondary | ICD-10-CM | POA: Diagnosis not present

## 2019-01-27 DIAGNOSIS — D225 Melanocytic nevi of trunk: Secondary | ICD-10-CM | POA: Diagnosis not present

## 2019-01-27 DIAGNOSIS — D1801 Hemangioma of skin and subcutaneous tissue: Secondary | ICD-10-CM | POA: Diagnosis not present

## 2019-01-27 DIAGNOSIS — L821 Other seborrheic keratosis: Secondary | ICD-10-CM | POA: Diagnosis not present

## 2019-01-27 DIAGNOSIS — L814 Other melanin hyperpigmentation: Secondary | ICD-10-CM | POA: Diagnosis not present

## 2019-01-27 DIAGNOSIS — R202 Paresthesia of skin: Secondary | ICD-10-CM | POA: Diagnosis not present

## 2019-01-31 ENCOUNTER — Encounter (INDEPENDENT_AMBULATORY_CARE_PROVIDER_SITE_OTHER): Payer: Medicare Other | Admitting: Cardiology

## 2019-01-31 DIAGNOSIS — G4733 Obstructive sleep apnea (adult) (pediatric): Secondary | ICD-10-CM | POA: Diagnosis not present

## 2019-01-31 DIAGNOSIS — G4734 Idiopathic sleep related nonobstructive alveolar hypoventilation: Secondary | ICD-10-CM

## 2019-01-31 DIAGNOSIS — G4731 Primary central sleep apnea: Secondary | ICD-10-CM

## 2019-02-01 NOTE — Telephone Encounter (Addendum)
Late Entry: Fax came on 01/27/19 for this patient concerning his HST not being completed and [facility] can not reach him after 3 attempts.  I reached out to the patient on his cell  lmtcb and asked him to please call [facility] at [phone] or myself at [phone] to get this matter resolved.

## 2019-02-02 ENCOUNTER — Telehealth: Payer: Self-pay | Admitting: *Deleted

## 2019-02-02 DIAGNOSIS — R351 Nocturia: Secondary | ICD-10-CM | POA: Diagnosis not present

## 2019-02-02 DIAGNOSIS — C61 Malignant neoplasm of prostate: Secondary | ICD-10-CM | POA: Diagnosis not present

## 2019-02-02 NOTE — Telephone Encounter (Addendum)
Informed patient of sleep study results and patient understanding was verbalized. Patient understands his sleep study showed they have sleep apnea and recommend CPAP titration. Please set up titration in the sleep lab due to severity of OSA and central sleep apnea. Patient has questions about his test numbers and wants to talk with Dr Radford Pax. I have messaged her for a possible virtual visit with the patient which he has already agreed to do.  Titration sent to sleep pool

## 2019-02-02 NOTE — Telephone Encounter (Signed)
-----   Message from Sueanne Margarita, MD sent at 02/02/2019  4:25 PM EDT ----- Please let patient know that they have sleep apnea and recommend CPAP titration. Please set up titration in the sleep lab due to severity of OSA and central sleep apnea

## 2019-02-02 NOTE — Procedures (Signed)
   Sleep Study Report  Patient Information Name: Curtis Hill ID: [id number] Birth Date: 21-Dec-1945  Age: 73  Gender: Male  BMI: 60.0 (W=419 lb, H=5' 10'') Study Date:02/02/2019 Referring Physician: Fransico Him. MD  TEST DESCRIPTION: Home sleep apnea testing was completed using the WatchPat, a Type 1device, utilizing peripheral arterial tonometry (PAT), chest movement, actigraphy, pulse oximetry, pulse rate, body position and snore. AHI was calculated with apnea and hypopnea using valid sleep time as the denominator. RDI includes apneas, hypopneas, and RERAs. The data acquired and the scoring of sleep and all associated events were performed in accordance with the recommended standards and specifications as outlined in the AASM Manual for the Scoring of Sleep and Associated Events 2.2.0 (2015).  FINDINGS: 1. Severe Obstructive Sleep Apnea (G47.33) with AHI = 68.8/hr. 2. Moderate Central Sleep Apnea with CAI = 24/hr with 24% [place] Stokes Respirations. 3. Nocturnal Hypoxemia with lowest O2 sat 63% with time spent with O2 sats<88% at 141min. 4. Total sleep time 6 hr and 42 min. 5. Shortened sleep onset latency at 6 min. 6. Shortened REM sleep onset latency at 49 min. 7. Increased number or awakenings at 16. 8. Severe snoring. 9. Average heart rate 58bpm.  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 Consider continuous positive airway pressure (CPAP) as the initial treatment of choice for severe obstructive sleep apnea. If the patient chooses CPAP therapy, a nocturnal PSG with CPAP titration is recommended. As an alternative, an Auto PAP with pressure range 5-20cm H2O with download is an option. Consider PAP interface (mask) fitted for patient comfort, heated humidification and PAP compliance monitoring (1 month, 3 months & 12 months after PAP Initiation).  2. Positive airway pressure therapy (PAP) is the  first line of treatment for patients with severe OSA. Alternative treatment for severe OSA in patients who cannot tolerate and have failed or refused CPAP therapy includes :   a. The patient may benefit from the use of nocturnal mandibular repositioning appliance. If that line of therapy is to be pursed, the patient should be evaluated by a dentist trained in the treatment of sleep related breathing disorders.   b. An ENT consultation which may be useful to look for specific causes of obstruction and possible treatment Options.   c. If patient is intolerant to PAP therapy, consider referral to ENT for evaluation for hypoglossal nerve stimulator.  3. Weight loss may be of benefit in reducing the severity of respiratory events and snoring .  4. Routine follow-up efficacy testing should be performed.  Report prepared by: Signature: Fransico Him Electronically Signed: Oc

## 2019-02-03 ENCOUNTER — Telehealth: Payer: Self-pay | Admitting: *Deleted

## 2019-02-03 NOTE — Telephone Encounter (Signed)
Staff message sent to [employer]. Ok to schedule CPAP titration. Patient has medicare. No PA is required.

## 2019-02-06 ENCOUNTER — Ambulatory Visit: Payer: Medicare Other

## 2019-02-06 ENCOUNTER — Other Ambulatory Visit: Payer: Self-pay

## 2019-02-06 NOTE — Telephone Encounter (Addendum)
RE: SLEEP STUDY Freada Bergeron, CMA  Sueanne Margarita, MD        Patient changed his mind about a meeting with you and will now do the cpap titration.   cpap titration sent to precert

## 2019-02-07 ENCOUNTER — Telehealth: Payer: Self-pay | Admitting: *Deleted

## 2019-02-07 DIAGNOSIS — G4733 Obstructive sleep apnea (adult) (pediatric): Secondary | ICD-10-CM

## 2019-02-07 NOTE — Telephone Encounter (Signed)
-----   Message from Lauralee Evener, [place] sent at 02/03/2019  9:15 AM EDT ----- Regarding: RE: precert Ok to schedule titration. Patient has medicare. ----- Message ----- From: Freada Bergeron, CMA Sent: 02/02/2019  10:04 PM EDT To: [facility] Sleep Studies Subject: precert                                         recommend CPAP titration.

## 2019-02-08 NOTE — Telephone Encounter (Addendum)
Patient is scheduled for CPAP Titration on 02/21/19. Curtis Hill is scheduled for COVID screening on 02/23/19 11:55 prior to titration.  Patient understands his titration study will be done at [facility] sleep lab. Patient understands he will receive a letter in a week or so detailing appointment, date, time, and location. Patient understands to call if he does not receive the letter  in a timely manner. Patient agrees with treatment and thanked me for call.

## 2019-02-18 ENCOUNTER — Other Ambulatory Visit (HOSPITAL_COMMUNITY)
Admission: RE | Admit: 2019-02-18 | Discharge: 2019-02-18 | Disposition: A | Discharge status: Home / Self Care | Payer: Medicare Other | Source: Ambulatory Visit | Attending: Cardiology | Admitting: Cardiology

## 2019-02-18 DIAGNOSIS — Z20828 Contact with and (suspected) exposure to other viral communicable diseases: Secondary | ICD-10-CM | POA: Insufficient documentation

## 2019-02-18 DIAGNOSIS — Z01812 Encounter for preprocedural laboratory examination: Secondary | ICD-10-CM | POA: Insufficient documentation

## 2019-02-19 LAB — SARS CORONAVIRUS 2 (TAT 6-24 HRS): SARS Coronavirus 2: NEGATIVE

## 2019-02-21 ENCOUNTER — Ambulatory Visit (HOSPITAL_BASED_OUTPATIENT_CLINIC_OR_DEPARTMENT_OTHER): Payer: Medicare Other | Attending: Cardiology | Admitting: Cardiology

## 2019-02-21 ENCOUNTER — Other Ambulatory Visit: Payer: Self-pay

## 2019-02-21 DIAGNOSIS — G4733 Obstructive sleep apnea (adult) (pediatric): Secondary | ICD-10-CM | POA: Diagnosis not present

## 2019-02-22 ENCOUNTER — Telehealth: Payer: Self-pay | Admitting: *Deleted

## 2019-02-22 ENCOUNTER — Other Ambulatory Visit: Payer: Self-pay | Admitting: Gastroenterology

## 2019-02-22 NOTE — Telephone Encounter (Signed)
Patient has a 10 week follow up appointment scheduled for 03/30/19 8:40. Patient understands he needs to keep this appointment for insurance compliance. Patient was grateful for the call and thanked me.

## 2019-02-22 NOTE — Telephone Encounter (Signed)

## 2019-02-23 ENCOUNTER — Other Ambulatory Visit (HOSPITAL_COMMUNITY)
Admission: RE | Admit: 2019-02-23 | Discharge: 2019-02-23 | Disposition: A | Discharge status: Home / Self Care | Payer: Medicare Other | Source: Ambulatory Visit | Attending: Gastroenterology | Admitting: Gastroenterology

## 2019-02-23 DIAGNOSIS — Z01812 Encounter for preprocedural laboratory examination: Secondary | ICD-10-CM | POA: Insufficient documentation

## 2019-02-23 DIAGNOSIS — Z20828 Contact with and (suspected) exposure to other viral communicable diseases: Secondary | ICD-10-CM | POA: Diagnosis not present

## 2019-02-24 LAB — NOVEL CORONAVIRUS, NAA (HOSP ORDER, SEND-OUT TO REF LAB; TAT 18-24 HRS): SARS-CoV-2, NAA: NOT DETECTED

## 2019-02-24 NOTE — Progress Notes (Signed)
Called and left message for patient to quarantine through the weekend. Reminded NPO and appt time.

## 2019-02-27 ENCOUNTER — Ambulatory Visit (HOSPITAL_COMMUNITY): Payer: Medicare Other | Admitting: Anesthesiology

## 2019-02-27 ENCOUNTER — Encounter (HOSPITAL_COMMUNITY)
Admission: RE | Disposition: A | Discharge status: Home / Self Care | Payer: Self-pay | Source: Home / Self Care | Attending: Gastroenterology

## 2019-02-27 ENCOUNTER — Encounter (HOSPITAL_COMMUNITY): Payer: Self-pay

## 2019-02-27 ENCOUNTER — Ambulatory Visit (HOSPITAL_COMMUNITY)
Admission: RE | Admit: 2019-02-27 | Discharge: 2019-02-27 | Disposition: A | Discharge status: Home / Self Care | Payer: Medicare Other | Attending: Gastroenterology | Admitting: Gastroenterology

## 2019-02-27 ENCOUNTER — Other Ambulatory Visit: Payer: Self-pay

## 2019-02-27 DIAGNOSIS — I719 Aortic aneurysm of unspecified site, without rupture: Secondary | ICD-10-CM | POA: Diagnosis not present

## 2019-02-27 DIAGNOSIS — K5730 Diverticulosis of large intestine without perforation or abscess without bleeding: Secondary | ICD-10-CM | POA: Insufficient documentation

## 2019-02-27 DIAGNOSIS — Z79899 Other long term (current) drug therapy: Secondary | ICD-10-CM | POA: Insufficient documentation

## 2019-02-27 DIAGNOSIS — I10 Essential (primary) hypertension: Secondary | ICD-10-CM | POA: Diagnosis not present

## 2019-02-27 DIAGNOSIS — Z860100 Personal history of colon polyps, unspecified: Secondary | ICD-10-CM

## 2019-02-27 DIAGNOSIS — Z6841 Body Mass Index (BMI) 40.0 and over, adult: Secondary | ICD-10-CM | POA: Diagnosis not present

## 2019-02-27 DIAGNOSIS — G4730 Sleep apnea, unspecified: Secondary | ICD-10-CM | POA: Insufficient documentation

## 2019-02-27 DIAGNOSIS — K640 First degree hemorrhoids: Secondary | ICD-10-CM | POA: Insufficient documentation

## 2019-02-27 DIAGNOSIS — D125 Benign neoplasm of sigmoid colon: Secondary | ICD-10-CM | POA: Diagnosis not present

## 2019-02-27 DIAGNOSIS — K219 Gastro-esophageal reflux disease without esophagitis: Secondary | ICD-10-CM | POA: Diagnosis not present

## 2019-02-27 DIAGNOSIS — I272 Pulmonary hypertension, unspecified: Secondary | ICD-10-CM | POA: Diagnosis not present

## 2019-02-27 DIAGNOSIS — I2510 Atherosclerotic heart disease of native coronary artery without angina pectoris: Secondary | ICD-10-CM | POA: Insufficient documentation

## 2019-02-27 DIAGNOSIS — Z1211 Encounter for screening for malignant neoplasm of colon: Secondary | ICD-10-CM | POA: Insufficient documentation

## 2019-02-27 DIAGNOSIS — K635 Polyp of colon: Secondary | ICD-10-CM | POA: Insufficient documentation

## 2019-02-27 HISTORY — PX: BIOPSY: SHX5522

## 2019-02-27 HISTORY — PX: COLONOSCOPY WITH PROPOFOL: SHX5780

## 2019-02-27 LAB — POCT I-STAT, CHEM 8
BUN: 16 mg/dL (ref 8–23)
Calcium, Ion: 1.26 mmol/L (ref 1.15–1.40)
Chloride: 103 mmol/L (ref 98–111)
Creatinine, Ser: 0.8 mg/dL (ref 0.61–1.24)
Glucose, Bld: 91 mg/dL (ref 70–99)
HCT: 37 % — ABNORMAL LOW (ref 39.0–52.0)
Hemoglobin: 12.6 g/dL — ABNORMAL LOW (ref 13.0–17.0)
Potassium: 3.9 mmol/L (ref 3.5–5.1)
Sodium: 141 mmol/L (ref 135–145)
TCO2: 27 mmol/L (ref 22–32)

## 2019-02-27 SURGERY — COLONOSCOPY WITH PROPOFOL
Anesthesia: Monitor Anesthesia Care

## 2019-02-27 MED ORDER — SODIUM CHLORIDE 0.9 % IV SOLN: INTRAVENOUS | Status: DC

## 2019-02-27 MED ORDER — LACTATED RINGERS IV SOLN
INTRAVENOUS | Status: DC
  Administered 2019-02-27: 09:00:00 via INTRAVENOUS

## 2019-02-27 MED ORDER — PROPOFOL 10 MG/ML IV BOLUS
INTRAVENOUS | Status: DC | PRN
  Administered 2019-02-27: 30 mg via INTRAVENOUS

## 2019-02-27 MED ORDER — PROPOFOL 500 MG/50ML IV EMUL
INTRAVENOUS | Status: DC | PRN
  Administered 2019-02-27: 100 ug/kg/min via INTRAVENOUS

## 2019-02-27 MED ORDER — LIDOCAINE 2% (20 MG/ML) 5 ML SYRINGE
INTRAMUSCULAR | Status: DC | PRN
  Administered 2019-02-27: 100 mg via INTRAVENOUS

## 2019-02-27 MED ORDER — PROPOFOL 500 MG/50ML IV EMUL
INTRAVENOUS | Status: AC
  Filled 2019-02-27: qty 50

## 2019-02-27 SURGICAL SUPPLY — 21 items

## 2019-02-27 NOTE — Discharge Instructions (Signed)

## 2019-02-27 NOTE — H&P (Signed)
Date of Initial H&P: 02/22/19  History reviewed, patient examined, no change in status, stable for surgery.

## 2019-02-27 NOTE — Anesthesia Procedure Notes (Signed)
Date/Time: 02/27/2019 9:27 AM Performed by: Talbot Grumbling, CRNA Oxygen Delivery Method: Simple face mask

## 2019-02-27 NOTE — Interval H&P Note (Signed)
History and Physical Interval Note:  02/27/2019 9:17 AM  Curtis Hill  has presented today for surgery, with the diagnosis of History of colon polyps.  The various methods of treatment have been discussed with the patient and family. After consideration of risks, benefits and other options for treatment, the patient has consented to  Procedure(s): COLONOSCOPY WITH PROPOFOL (N/A) as a surgical intervention.  The patient's history has been reviewed, patient examined, no change in status, stable for surgery.  I have reviewed the patient's chart and labs.  Questions were answered to the patient's satisfaction.     Lear Ng

## 2019-02-27 NOTE — Op Note (Signed)
[facility] Patient Name: Curtis Hill Procedure Date: 02/27/2019 MRN: [id number] Attending MD: Lear Ng , MD Date of Birth: 21-Dec-1945 CSN: [id number] Age: 73 Admit Type: Outpatient Procedure:                Colonoscopy Indications:              High risk colon cancer surveillance: Personal                            history of colonic polyps, Last colonoscopy: April                            2013 Providers:                Lear Ng, MD, Cleda Daub, RN, Ladona Ridgel, Technician Referring MD:             Lona Kettle Medicines:                Propofol per Anesthesia, Monitored Anesthesia Care Complications:            No immediate complications. Estimated Blood Loss:     Estimated blood loss was minimal. Procedure:                Pre-Anesthesia Assessment:                           - Prior to the procedure, a History and Physical                            was performed, and patient medications and                            allergies were reviewed. The patient's tolerance of                            previous anesthesia was also reviewed. The risks                            and benefits of the procedure and the sedation                            options and risks were discussed with the patient.                            All questions were answered, and informed consent                            was obtained. Prior Anticoagulants: The patient has                            taken no previous anticoagulant or antiplatelet  agents. ASA Grade Assessment: III - A patient with                            severe systemic disease. After reviewing the risks                            and benefits, the patient was deemed in                            satisfactory condition to undergo the procedure.                           After obtaining informed consent, the colonoscope        was passed under direct vision. Throughout the                            procedure, the patient's blood pressure, pulse, and                            oxygen saturations were monitored continuously. The                            [id number] [id number] Olympus colonoscope was                            introduced through the anus and advanced to the the                            cecum, identified by appendiceal orifice and                            ileocecal valve. The colonoscopy was performed with                            difficulty due to significant looping, a tortuous                            colon and fair prep. Successful completion of the                            procedure was aided by straightening and shortening                            the scope to obtain bowel loop reduction, using                            scope torsion, applying abdominal pressure and                            lavage. The patient tolerated the procedure well.                            The quality of the bowel preparation was fair and  fair but repeated irrigation led to a good and                            adequate prep. The ileocecal valve, appendiceal                            orifice, and rectum were photographed. Scope In: 9:33:40 AM Scope Out: 9:51:07 AM Scope Withdrawal Time: 0 hours 8 minutes 17 seconds  Total Procedure Duration: 0 hours 17 minutes 27 seconds  Findings:      The perianal and digital rectal examinations were normal.      A 2 mm polyp was found in the sigmoid colon. The polyp was semi-sessile.       The polyp was removed with a cold biopsy forceps. Resection and       retrieval were complete. Estimated blood loss was minimal.      Multiple small and large-mouthed diverticula were found in the sigmoid       colon.      Internal hemorrhoids were found during retroflexion. The hemorrhoids       were medium-sized and Grade I (internal  hemorrhoids that do not       prolapse).      A tattoo was seen in the descending colon. The tattoo site appeared       normal. Impression:               - Preparation of the colon was fair.                           - One 2 mm polyp in the sigmoid colon, removed with                            a cold biopsy forceps. Resected and retrieved.                           - Diverticulosis in the sigmoid colon.                           - Internal hemorrhoids.                           - A tattoo was seen in the descending colon. The                            tattoo site appeared normal. Moderate Sedation:      Not Applicable - Patient had care per Anesthesia. Recommendation:           - Patient has a contact number available for                            emergencies. The signs and symptoms of potential                            delayed complications were discussed with the                            patient. Return  to normal activities tomorrow.                            Written discharge instructions were provided to the                            patient.                           - High fiber diet.                           - Await pathology results.                           - Repeat colonoscopy is not recommended for                            screening purposes. Procedure Code(s):        --- Professional ---                           [phone], Colonoscopy, flexible; with biopsy, single                            or multiple Diagnosis Code(s):        --- Professional ---                           Z86.010, Personal history of colonic polyps                           K63.5, Polyp of colon                           K64.0, First degree hemorrhoids                           K57.30, Diverticulosis of large intestine without                            perforation or abscess without bleeding CPT copyright 2019 American Medical Association. All rights reserved. The codes documented in this  report are preliminary and upon coder review may  be revised to meet current compliance requirements. Lear Ng, MD 02/27/2019 10:00:30 AM This report has been signed electronically. Number of Addenda: 0

## 2019-02-27 NOTE — Transfer of Care (Signed)
Immediate Anesthesia Transfer of Care Note  Patient: Curtis Hill  Procedure(s) Performed: COLONOSCOPY WITH PROPOFOL (N/A ) BIOPSY  Patient Location: PACU  Anesthesia Type:MAC  Level of Consciousness: awake, alert  and oriented  Airway & Oxygen Therapy: Patient Spontanous Breathing and Patient connected to face mask oxygen  Post-op Assessment: Report given to RN and Post -op Vital signs reviewed and stable  Post vital signs: Reviewed and stable  Last Vitals:  Vitals Value Taken Time  BP    Temp    Pulse    Resp    SpO2      Last Pain:  Vitals:   02/27/19 0823  TempSrc: Oral  PainSc: 0-No pain         Complications: No apparent anesthesia complications

## 2019-02-27 NOTE — Anesthesia Postprocedure Evaluation (Signed)
Anesthesia Post Note  Patient: Curtis Hill  Procedure(s) Performed: COLONOSCOPY WITH PROPOFOL (N/A ) BIOPSY     Patient location during evaluation: Endoscopy Anesthesia Type: MAC Level of consciousness: awake and alert Pain management: pain level controlled Vital Signs Assessment: post-procedure vital signs reviewed and stable Respiratory status: spontaneous breathing, nonlabored ventilation and respiratory function stable Cardiovascular status: blood pressure returned to baseline and stable Postop Assessment: no apparent nausea or vomiting Anesthetic complications: no    Last Vitals:  Vitals:   02/27/19 1010 02/27/19 1020  BP: (!) 147/82 (!) 177/96  Pulse: 81 68  Resp: (!) 21 14  Temp:    SpO2: 98% 100%    Last Pain:  Vitals:   02/27/19 0958  TempSrc: Oral  PainSc: 0-No pain                 Lidia Collum

## 2019-02-27 NOTE — Anesthesia Preprocedure Evaluation (Signed)
Anesthesia Evaluation  Patient identified by MRN, date of birth, ID band Patient awake    Reviewed: Allergy & Precautions, NPO status , Patient's Chart, lab work & pertinent test results  History of Anesthesia Complications Negative for: history of anesthetic complications  Airway Mallampati: I  TM Distance: >3 FB Neck ROM: Full    Dental  (+) Teeth Intact   Pulmonary sleep apnea and Continuous Positive Airway Pressure Ventilation ,  pulm HTN   Pulmonary exam normal        Cardiovascular hypertension, + CAD  (-) Past MI and (-) Cardiac Stents Normal cardiovascular exam+ Valvular Problems/Murmurs AS   Aortic aneurysm followed by [facility]   Neuro/Psych negative neurological ROS  negative psych ROS   GI/Hepatic Neg liver ROS, hiatal hernia, GERD  ,  Endo/Other  Morbid obesity (BMI 57)  Renal/GU negative Renal ROS  negative genitourinary   Musculoskeletal  (+) Arthritis ,   Abdominal   Peds  Hematology negative hematology ROS (+)   Anesthesia Other Findings Echo 2019: EF 60-65%, grade 2 dd, mild AS, aortic root dilation (4.5 cm), mild MR, PASP 23  Stress test 03/2018:   The left ventricular ejection fraction is normal (55-65%).  Nuclear stress EF: 56%.  There was no ST segment deviation noted during stress.  No T wave inversion was noted during stress.  Defect 1: There is a medium defect of moderate severity present in the basal inferoseptal, basal inferior, mid inferior and apical inferior location. This is likely due to diaphragmatic attenuation. However, cannot rule out prior inferior infarct.  This is a low risk study.  Cath 10/2018:   Angiographically widely patent left main.  Mid LAD 40% and distal LAD 70%.  Normal moderate to large circumflex.  Minimal luminal irregularities noted in a dominant RCA.  Distal LV branch contains 50% ostial narrowing  Aortic valve was crossed but hemodynamics  could not be measured due to catheter recoil.  Moderate pulmonary hypertension and elevated pulmonary wedge, 20 mmHg.  Etiology of pulmonary hypertension is likely multifactorial.  Consider sleep apnea.  More aggressive diuresis may also help.  Reproductive/Obstetrics                             Anesthesia Physical Anesthesia Plan  ASA: III  Anesthesia Plan: MAC   Post-op Pain Management:    Induction: Intravenous  PONV Risk Score and Plan: 1 and Propofol infusion, TIVA and Treatment may vary due to age or medical condition  Airway Management Planned: Natural Airway, Nasal Cannula and Simple Face Mask  Additional Equipment: None  Intra-op Plan:   Post-operative Plan:   Informed Consent: I have reviewed the patients History and Physical, chart, labs and discussed the procedure including the risks, benefits and alternatives for the proposed anesthesia with the patient or authorized representative who has indicated his/her understanding and acceptance.       Plan Discussed with:   Anesthesia Plan Comments:         Anesthesia Quick Evaluation

## 2019-02-28 LAB — SURGICAL PATHOLOGY

## 2019-03-02 ENCOUNTER — Encounter (HOSPITAL_COMMUNITY): Payer: Self-pay | Admitting: Gastroenterology

## 2019-03-05 NOTE — Procedures (Signed)
    Patient Name: Curtis Hill, Curtis Hill Date: 02/21/2019 Gender: Male D.O.B: November 08, 1945 Age (years): 40 Referring Provider: Fransico Him MD, ABSM Height (inches): 70 Interpreting Physician: Fransico Him MD, ABSM Weight (lbs): 400 RPSGT: Zadie Rhine BMI: 57 MRN: [id number] Neck Size: 18.50  CLINICAL INFORMATION The patient is referred for a CPAP titration to treat sleep apnea.  SLEEP STUDY TECHNIQUE As per the AASM Manual for the Scoring of Sleep and Associated Events v2.3 (April 2016) with a hypopnea requiring 4% desaturations.  The channels recorded and monitored were frontal, central and occipital EEG, electrooculogram (EOG), submentalis EMG (chin), nasal and oral airflow, thoracic and abdominal wall motion, anterior tibialis EMG, snore microphone, electrocardiogram, and pulse oximetry. Continuous positive airway pressure (CPAP) was initiated at the beginning of the study and titrated to treat sleep-disordered breathing.  MEDICATIONS Medications self-administered by patient taken the night of the study : N/A  TECHNICIAN COMMENTS Comments added by technician: ONE RESTROOM VISTED Comments added by scorer: N/A  RESPIRATORY PARAMETERS Optimal PAP Pressure (cm): 15  AHI at Optimal Pressure (/hr):0.0 Overall Minimal O2 (%):84.0  Supine % at Optimal Pressure (%): 0 Minimal O2 at Optimal Pressure (%): 90.0   SLEEP ARCHITECTURE The study was initiated at 10:17:03 PM and ended at 4:19:43 AM.  Sleep onset time was 1.4 minutes and the sleep efficiency was 69.1%. The total sleep time was 250.5 minutes.  The patient spent 4.0% of the night in stage N1 sleep, 49.1% in stage N2 sleep, 0.0% in stage N3 and 46.9% in REM.Stage REM latency was 69.0 minutes  Wake after sleep onset was 110.8. Alpha intrusion was absent. Supine sleep was 0.00%.  CARDIAC DATA The 2 lead EKG demonstrated sinus rhythm. The mean heart rate was 70.5 beats per minute. Other EKG findings include: PVCs.  LEG  MOVEMENT DATA The total Periodic Limb Movements of Sleep (PLMS) were 0. The PLMS index was 0.0. A PLMS index of <15 is considered normal in adults.  IMPRESSIONS - The optimal PAP pressure was 15 cm of water. - Central sleep apnea was not noted during this titration (CAI = 4.8/h). - Moderate oxygen desaturations were observed during this titration (min O2 = 84.0%). - No snoring was audible during this study. - 2-lead EKG demonstrated: PVCs - Clinically significant periodic limb movements were not noted during this study. Arousals associated with PLMs were rare.  DIAGNOSIS - Obstructive Sleep Apnea (327.23 [G47.33 ICD-10])  RECOMMENDATIONS - Trial of CPAP therapy on 15 cm H2O with a Medium size Resmed Full Face Mask AirFit F20 mask and heated humidification. - Avoid alcohol, sedatives and other CNS depressants that may worsen sleep apnea and disrupt normal sleep architecture. - Sleep hygiene should be reviewed to assess factors that may improve sleep quality. - Weight management and regular exercise should be initiated or continued. - Return to [facility] Center for re-evaluation after 10 weeks of therapy  [Electronically signed] 03/05/2019 10:33 PM  Fransico Him MD, ABSM Diplomate, American Board of Sleep Medicine

## 2019-03-08 ENCOUNTER — Telehealth: Payer: Self-pay | Admitting: *Deleted

## 2019-03-08 NOTE — Telephone Encounter (Signed)
Patient has a 10 week follow up appointment scheduled for 03/30/19. Patient understands he needs to keep this appointment for insurance compliance. Patient was grateful for the call and thanked me.

## 2019-03-08 NOTE — Telephone Encounter (Signed)
Informed patient of sleep study results and patient understanding was verbalized. Patient understands his sleep study showed that they had a successful PAP titration and let DME know that orders are in EPIC. Please set up 10 week OV with me.   Upon patient request DME selection is [facility]. Patient understands he will be contacted by [facility] to set up his cpap. Patient understands to call if [facility] does not contact him with new setup in a timely manner. Patient understands they will be called once confirmation has been received from [facility] that they have received their new machine to schedule 10 week follow up appointment.  [facility] notified of new cpap order  Please add to airview Patient was grateful for the call and thanked me.

## 2019-03-08 NOTE — Telephone Encounter (Signed)
-----   Message from Sueanne Margarita, MD sent at 03/05/2019 10:36 PM EST ----- Please let patient know that they had a successful PAP titration and let DME know that orders are in EPIC.  Please set up 10 week OV with me.

## 2019-03-29 ENCOUNTER — Telehealth: Payer: Self-pay | Admitting: *Deleted
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
## 2019-03-29 NOTE — Telephone Encounter (Signed)

## 2019-03-30 ENCOUNTER — Other Ambulatory Visit: Payer: Self-pay

## 2019-03-30 ENCOUNTER — Telehealth (INDEPENDENT_AMBULATORY_CARE_PROVIDER_SITE_OTHER): Payer: Medicare Other | Admitting: Cardiology

## 2019-03-30 VITALS — BP 118/78 | HR 84 | Ht 70.0 in | Wt >= 6400 oz

## 2019-03-30 DIAGNOSIS — I10 Essential (primary) hypertension: Secondary | ICD-10-CM | POA: Diagnosis not present

## 2019-03-30 DIAGNOSIS — G4733 Obstructive sleep apnea (adult) (pediatric): Secondary | ICD-10-CM | POA: Diagnosis not present

## 2019-03-30 DIAGNOSIS — I2510 Atherosclerotic heart disease of native coronary artery without angina pectoris: Secondary | ICD-10-CM

## 2019-03-30 NOTE — Patient Instructions (Signed)
Medication Instructions:  Your physician recommends that you continue on your current medications as directed. Please refer to the Current Medication list given to you today.  *If you need a refill on your cardiac medications before your next appointment, please call your pharmacy*  Lab Work: none If you have labs (blood work) drawn today and your tests are completely normal, you will receive your results only by: Marland Kitchen MyChart Message (if you have MyChart) OR . A paper copy in the mail If you have any lab test that is abnormal or we need to change your treatment, we will call you to review the results.  Testing/Procedures: none  Follow-Up: At [facility], you and your health needs are our priority.  As part of our continuing mission to provide you with exceptional heart care, we have created designated Provider Care Teams.  These Care Teams include your primary Cardiologist (physician) and Advanced Practice Providers (APPs -  Physician Assistants and Nurse Practitioners) who all work together to provide you with the care you need, when you need it.  Your next appointment:   January 12,2021 at 4:00  The format for your next appointment:   Virtual Visit   Provider:   Fransico Him, MD  Other Instructions

## 2019-04-25 ENCOUNTER — Telehealth: Payer: Medicare Other | Admitting: Cardiology

## 2019-04-28 ENCOUNTER — Telehealth (INDEPENDENT_AMBULATORY_CARE_PROVIDER_SITE_OTHER): Payer: Medicare Other | Admitting: Cardiology

## 2019-04-28 ENCOUNTER — Encounter: Payer: Self-pay | Admitting: Cardiology

## 2019-04-28 ENCOUNTER — Other Ambulatory Visit: Payer: Self-pay

## 2019-04-28 VITALS — BP 121/78 | HR 85 | Ht 70.0 in | Wt >= 6400 oz

## 2019-04-28 DIAGNOSIS — I712 Thoracic aortic aneurysm, without rupture, unspecified: Secondary | ICD-10-CM

## 2019-04-28 DIAGNOSIS — I2510 Atherosclerotic heart disease of native coronary artery without angina pectoris: Secondary | ICD-10-CM | POA: Diagnosis not present

## 2019-04-28 DIAGNOSIS — I10 Essential (primary) hypertension: Secondary | ICD-10-CM

## 2019-04-28 DIAGNOSIS — G4733 Obstructive sleep apnea (adult) (pediatric): Secondary | ICD-10-CM

## 2019-04-28 DIAGNOSIS — I350 Nonrheumatic aortic (valve) stenosis: Secondary | ICD-10-CM | POA: Diagnosis not present

## 2019-04-28 DIAGNOSIS — I272 Pulmonary hypertension, unspecified: Secondary | ICD-10-CM | POA: Diagnosis not present

## 2019-04-28 DIAGNOSIS — E7800 Pure hypercholesterolemia, unspecified: Secondary | ICD-10-CM
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
## 2019-04-28 NOTE — Addendum Note (Signed)
Addended by: Harland German A on: 04/28/2019 11:28 AM   Modules accepted: Orders

## 2019-05-02 ENCOUNTER — Telehealth: Payer: Self-pay | Admitting: *Deleted

## 2019-05-02 DIAGNOSIS — G4733 Obstructive sleep apnea (adult) (pediatric): Secondary | ICD-10-CM

## 2019-05-02 NOTE — Telephone Encounter (Signed)
Order placed to [facility] and a d/l in 2 weeks and 3 month follow up appointment w/TT maybe office or virtual visit.

## 2019-05-02 NOTE — Telephone Encounter (Signed)
-----   Message from Sueanne Margarita, MD sent at 04/28/2019 11:15 AM EST ----- Please change patient's CPAP to 15cm H2O and get a download in 2 weeks.  Followup with me in 3 months

## 2019-05-09 ENCOUNTER — Telehealth: Payer: Self-pay | Admitting: Cardiology

## 2019-05-09 NOTE — Telephone Encounter (Signed)
Patient is calling stating the changes made to his CPAP machine to 15 lbs is to much for his mask and it cannot contain it. Please advise.

## 2019-05-09 NOTE — Telephone Encounter (Signed)
His AHi is 5.2 on pressure of 15cm H2o so he will not be adequately treated on a lower pressure.  Therefore we need to change to BiPAP with in lab study

## 2019-05-09 NOTE — Telephone Encounter (Signed)
The order to Please change patient's CPAP to 15cm H2O is too much for the patient. Please advise

## 2019-05-09 NOTE — Telephone Encounter (Signed)
I think we need to do BiPAP as he is not tolerating CPAP and auto CPAP his AHI was too high - please set up for in lab BiPAP titration

## 2019-05-09 NOTE — Telephone Encounter (Signed)
Patient says he mask can't handle that pressure it blows through the seal. It feels him up with air. This is his down load from the pressure change on Friday 05/05/19 - 05/09/19. Patient feels like a pressure of 13 cm H20 would be better.  [employer] [facility] [place], [place] Phone: [phone] Email: [email or web address] [email or web address] Compliance Report Usage 05/05/2019 - 05/08/2019 Usage days 4/4 days (100%) >= 4 hours 4 days (100%) < 4 hours 0 days (0%) Usage hours 24 hours 4 minutes Average usage (total days) 6 hours 1 minutes Average usage (days used) 6 hours 1 minutes Median usage (days used) 6 hours 16 minutes Total used hours (value since last reset - 05/08/2019) 360 hours AirSense 10 AutoSet Serial number [id number] Mode CPAP Set pressure 15 cmH2O EPR Fulltime EPR level 2 Therapy Leaks - L/min Median: 9.6 95th percentile: 43.2 Maximum: 76.2 Events per hour AI: 3.8 HI: 1.4 AHI: 5.2 Apnea Index Central: 1.7 Obstructive: 1.0 Unknown: 1.0 RERA Index 0.0 Cheyne-Stokes respiration (average duration per night) 3 minutes (1%) Usage - hours Printed on 05/09/2019 - ResMed AirView version 4.21.1-2.0 Page 1 of 1

## 2019-05-12 ENCOUNTER — Other Ambulatory Visit: Payer: Self-pay

## 2019-05-12 ENCOUNTER — Ambulatory Visit (HOSPITAL_COMMUNITY): Payer: Medicare Other | Attending: Cardiology

## 2019-05-12 DIAGNOSIS — I272 Pulmonary hypertension, unspecified: Secondary | ICD-10-CM | POA: Diagnosis not present

## 2019-05-12 NOTE — Telephone Encounter (Signed)
His last download looked ok despite large mask leak

## 2019-05-12 NOTE — Telephone Encounter (Signed)
Patient refuses to go back to the lab due to covid 19 until he gets his covid vaccine. He says it works ok some nights and some nights not. He is working on trying to adjust the mask himself. He will test again but not now.

## 2019-05-12 NOTE — Telephone Encounter (Signed)
Yes, he said he lays on his side and thinks the seal may be breaking then.

## 2019-05-14 ENCOUNTER — Ambulatory Visit: Payer: Medicare Other

## 2019-05-19 ENCOUNTER — Ambulatory Visit: Payer: Medicare Other

## 2019-05-21 ENCOUNTER — Ambulatory Visit: Payer: Medicare Other | Attending: Internal Medicine

## 2019-05-21 DIAGNOSIS — Z23 Encounter for immunization: Secondary | ICD-10-CM

## 2019-05-21 NOTE — Progress Notes (Signed)
   Covid-19 Vaccination Clinic  Name:  Curtis Hill    MRN: [id number] DOB: Aug 18, 1945  05/21/2019  Curtis Hill was observed post Covid-19 immunization for 30 minutes based on pre-vaccination screening without incidence. He was provided with Vaccine Information Sheet and instruction to access the V-Safe system.   Curtis Hill was instructed to call 911 with any severe reactions post vaccine: Marland Kitchen Difficulty breathing  . Swelling of your face and throat  . A fast heartbeat  . A bad rash all over your body  . Dizziness and weakness    Immunizations Administered    Name Date Dose VIS Date Route   Pfizer COVID-19 Vaccine 05/21/2019 12:09 PM 0.3 mL 03/24/2019 Intramuscular   Manufacturer: [place]   Lot: [id number]   [place]: [id number]

## 2019-06-14 ENCOUNTER — Telehealth: Payer: Self-pay | Admitting: *Deleted

## 2019-06-14 NOTE — Telephone Encounter (Signed)
That is fine since download looks good

## 2019-06-14 NOTE — Telephone Encounter (Signed)
Informed patient of compliance results and verbalized understanding was indicated. Patient is aware and agreeable to AHI being within range at 5.3. Patient is aware and agreeable to being in compliance with machine usage. Patient is aware and agreeable to no change in current pressures.  Patient states he has worked out the kinks and feels confident about using his unit now and really wants to cancel the Bipap titration that he was waiting to get once he is vaccinated.

## 2019-06-14 NOTE — Telephone Encounter (Signed)
-----   Message from Sueanne Margarita, MD sent at 06/12/2019  5:15 PM EST ----- Good AHI and compliance.  Continue current PAP settings.

## 2019-06-14 NOTE — Telephone Encounter (Signed)
Informed patient of compliance results and verbalized understanding was indicated. Patient is aware and agreeable to AHI being within range at 5.3. Patient is aware and agreeable to being in compliance with machine usage. Patient is aware and agreeable to no change in current pressures.

## 2019-06-15 ENCOUNTER — Ambulatory Visit: Payer: Medicare Other | Attending: Internal Medicine

## 2019-06-15 DIAGNOSIS — Z23 Encounter for immunization: Secondary | ICD-10-CM | POA: Insufficient documentation

## 2019-06-15 NOTE — Progress Notes (Signed)
   Covid-19 Vaccination Clinic  Name:  Curtis Hill    MRN: [id number] DOB: May 22, 1945  06/15/2019  Curtis Hill was observed post Covid-19 immunization for 30 minutes based on pre-vaccination screening without incident. He was provided with Vaccine Information Sheet and instruction to access the V-Safe system.   Curtis Hill was instructed to call 911 with any severe reactions post vaccine: Marland Kitchen Difficulty breathing  . Swelling of face and throat  . A fast heartbeat  . A bad rash all over body  . Dizziness and weakness   Immunizations Administered    Name Date Dose VIS Date Route   Pfizer COVID-19 Vaccine 06/15/2019 12:16 PM 0.3 mL 03/24/2019 Intramuscular   Manufacturer: [place]   Lot: [id number]   [place]: [id number]

## 2019-06-19 DIAGNOSIS — Z961 Presence of intraocular lens: Secondary | ICD-10-CM | POA: Diagnosis not present

## 2019-06-19 DIAGNOSIS — Q142 Congenital malformation of optic disc: Secondary | ICD-10-CM | POA: Diagnosis not present

## 2019-07-11 DIAGNOSIS — M1990 Unspecified osteoarthritis, unspecified site: Secondary | ICD-10-CM | POA: Diagnosis not present

## 2019-07-11 DIAGNOSIS — I10 Essential (primary) hypertension: Secondary | ICD-10-CM | POA: Diagnosis not present

## 2019-07-11 DIAGNOSIS — I2510 Atherosclerotic heart disease of native coronary artery without angina pectoris: Secondary | ICD-10-CM | POA: Diagnosis not present

## 2019-07-11 DIAGNOSIS — Z8546 Personal history of malignant neoplasm of prostate: Secondary | ICD-10-CM | POA: Diagnosis not present

## 2019-07-11 DIAGNOSIS — E785 Hyperlipidemia, unspecified: Secondary | ICD-10-CM | POA: Diagnosis not present

## 2019-07-11 DIAGNOSIS — J4520 Mild intermittent asthma, uncomplicated: Secondary | ICD-10-CM | POA: Diagnosis not present

## 2019-09-27 DIAGNOSIS — Z8546 Personal history of malignant neoplasm of prostate: Secondary | ICD-10-CM | POA: Diagnosis not present

## 2019-09-27 DIAGNOSIS — I2510 Atherosclerotic heart disease of native coronary artery without angina pectoris: Secondary | ICD-10-CM | POA: Diagnosis not present

## 2019-09-27 DIAGNOSIS — J4520 Mild intermittent asthma, uncomplicated: Secondary | ICD-10-CM | POA: Diagnosis not present

## 2019-09-27 DIAGNOSIS — I10 Essential (primary) hypertension: Secondary | ICD-10-CM | POA: Diagnosis not present

## 2019-09-27 DIAGNOSIS — M1990 Unspecified osteoarthritis, unspecified site: Secondary | ICD-10-CM | POA: Diagnosis not present

## 2019-09-27 DIAGNOSIS — E785 Hyperlipidemia, unspecified: Secondary | ICD-10-CM | POA: Diagnosis not present

## 2019-10-31 ENCOUNTER — Encounter: Payer: Self-pay | Admitting: Plastic Surgery

## 2019-10-31 ENCOUNTER — Other Ambulatory Visit: Payer: Self-pay

## 2019-10-31 ENCOUNTER — Ambulatory Visit (INDEPENDENT_AMBULATORY_CARE_PROVIDER_SITE_OTHER): Payer: Medicare Other | Admitting: Plastic Surgery

## 2019-10-31 ENCOUNTER — Other Ambulatory Visit: Payer: Self-pay | Admitting: *Deleted

## 2019-10-31 DIAGNOSIS — I2510 Atherosclerotic heart disease of native coronary artery without angina pectoris: Secondary | ICD-10-CM | POA: Diagnosis not present

## 2019-10-31 DIAGNOSIS — M793 Panniculitis, unspecified: Secondary | ICD-10-CM

## 2019-10-31 DIAGNOSIS — I712 Thoracic aortic aneurysm, without rupture, unspecified: Secondary | ICD-10-CM
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
## 2019-11-07 ENCOUNTER — Telehealth: Payer: Self-pay | Admitting: Plastic Surgery

## 2019-11-07 NOTE — Telephone Encounter (Signed)
Curtis Hill from [place] received referral and called to let us know that the weight is 144 in the notes, but the patient's weight is actually 444. She thought we might want to update that.
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
## 2019-11-10 ENCOUNTER — Encounter: Payer: Self-pay | Admitting: Cardiology

## 2019-11-10 ENCOUNTER — Other Ambulatory Visit: Payer: Self-pay

## 2019-11-10 ENCOUNTER — Telehealth (INDEPENDENT_AMBULATORY_CARE_PROVIDER_SITE_OTHER): Payer: Medicare Other | Admitting: Cardiology

## 2019-11-10 VITALS — BP 122/71 | HR 87 | Temp 98.7°F | Ht 70.0 in | Wt >= 6400 oz

## 2019-11-10 DIAGNOSIS — G4733 Obstructive sleep apnea (adult) (pediatric): Secondary | ICD-10-CM | POA: Diagnosis not present

## 2019-11-10 DIAGNOSIS — I712 Thoracic aortic aneurysm, without rupture, unspecified: Secondary | ICD-10-CM

## 2019-11-10 DIAGNOSIS — I2510 Atherosclerotic heart disease of native coronary artery without angina pectoris: Secondary | ICD-10-CM

## 2019-11-10 DIAGNOSIS — I272 Pulmonary hypertension, unspecified: Secondary | ICD-10-CM | POA: Diagnosis not present

## 2019-11-10 DIAGNOSIS — I10 Essential (primary) hypertension: Secondary | ICD-10-CM

## 2019-11-10 DIAGNOSIS — E785 Hyperlipidemia, unspecified: Secondary | ICD-10-CM | POA: Diagnosis not present

## 2019-11-10 DIAGNOSIS — I350 Nonrheumatic aortic (valve) stenosis: Secondary | ICD-10-CM | POA: Diagnosis not present

## 2019-11-10 NOTE — Patient Instructions (Signed)
Medication Instructions:  Your physician recommends that you continue on your current medications as directed. Please refer to the Current Medication list given to you today.  *If you need a refill on your cardiac medications before your next appointment, please call your pharmacy*   Lab Work: Fasting lipids and CMET If you have labs (blood work) drawn today and your tests are completely normal, you will receive your results only by: . MyChart Message (if you have MyChart) OR . A paper copy in the mail If you have any lab test that is abnormal or we need to change your treatment, we will call you to review the results.   Follow-Up: At [facility], you and your health needs are our priority.  As part of our continuing mission to provide you with exceptional heart care, we have created designated Provider Care Teams.  These Care Teams include your primary Cardiologist (physician) and Advanced Practice Providers (APPs -  Physician Assistants and Nurse Practitioners) who all work together to provide you with the care you need, when you need it.    Your next appointment:   1 year(s)  The format for your next appointment:   In Person  Provider:   You may see Traci Turner, MD or one of the following Advanced Practice Providers on your designated Care Team:    Dayna Dunn, PA-C  Michele Lenze, PA-C    

## 2019-11-10 NOTE — Addendum Note (Signed)
Addended by: Antonieta Iba on: 11/10/2019 09:28 AM   Modules accepted: Orders

## 2019-11-22 ENCOUNTER — Ambulatory Visit (INDEPENDENT_AMBULATORY_CARE_PROVIDER_SITE_OTHER): Payer: Medicare Other | Admitting: Cardiothoracic Surgery

## 2019-11-22 ENCOUNTER — Encounter: Payer: Self-pay | Admitting: Cardiothoracic Surgery

## 2019-11-22 ENCOUNTER — Ambulatory Visit
Admission: RE | Admit: 2019-11-22 | Discharge: 2019-11-22 | Disposition: A | Discharge status: Home / Self Care | Payer: Medicare Other | Source: Ambulatory Visit | Attending: Cardiothoracic Surgery | Admitting: Cardiothoracic Surgery

## 2019-11-22 ENCOUNTER — Other Ambulatory Visit: Payer: Medicare Other

## 2019-11-22 ENCOUNTER — Other Ambulatory Visit: Payer: Self-pay

## 2019-11-22 VITALS — BP 149/83 | HR 85 | Temp 97.8°F | Resp 20 | Ht 70.0 in

## 2019-11-22 DIAGNOSIS — I712 Thoracic aortic aneurysm, without rupture, unspecified: Secondary | ICD-10-CM

## 2019-11-22 DIAGNOSIS — K7689 Other specified diseases of liver: Secondary | ICD-10-CM | POA: Diagnosis not present

## 2019-11-22 DIAGNOSIS — I2510 Atherosclerotic heart disease of native coronary artery without angina pectoris: Secondary | ICD-10-CM

## 2019-11-22 DIAGNOSIS — I7121 Aneurysm of the ascending aorta, without rupture: Secondary | ICD-10-CM

## 2019-11-22 DIAGNOSIS — I700 Atherosclerosis of aorta: Secondary | ICD-10-CM | POA: Diagnosis not present

## 2019-11-22 MED ORDER — IOPAMIDOL (ISOVUE-370) INJECTION 76%
75.0000 mL | Freq: Once | INTRAVENOUS | Status: AC | PRN
  Administered 2019-11-22: 75 mL via INTRAVENOUS
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
## 2019-11-28 ENCOUNTER — Other Ambulatory Visit: Payer: Self-pay

## 2019-11-28 ENCOUNTER — Other Ambulatory Visit: Payer: Medicare Other

## 2019-11-28 DIAGNOSIS — E785 Hyperlipidemia, unspecified: Secondary | ICD-10-CM

## 2019-11-28 DIAGNOSIS — I10 Essential (primary) hypertension: Secondary | ICD-10-CM

## 2019-11-28 DIAGNOSIS — I2510 Atherosclerotic heart disease of native coronary artery without angina pectoris: Secondary | ICD-10-CM

## 2019-11-28 LAB — LIPID PANEL
Chol/HDL Ratio: 1.8 ratio (ref 0.0–5.0)
Cholesterol, Total: 128 mg/dL (ref 100–199)
HDL: 72 mg/dL (ref >39)
LDL Chol Calc (NIH): 40 mg/dL (ref 0–99)
Triglycerides: 80 mg/dL (ref 0–149)
VLDL Cholesterol Cal: 16 mg/dL (ref 5–40)

## 2019-11-28 LAB — COMPREHENSIVE METABOLIC PANEL
ALT: 23 IU/L (ref 0–44)
AST: 25 IU/L (ref 0–40)
Albumin/Globulin Ratio: 1.7 (ref 1.2–2.2)
Albumin: 4.6 g/dL (ref 3.7–4.7)
Alkaline Phosphatase: 70 IU/L (ref 48–121)
BUN/Creatinine Ratio: 21 (ref 10–24)
BUN: 21 mg/dL (ref 8–27)
Bilirubin Total: 0.6 mg/dL (ref 0.0–1.2)
CO2: 24 mmol/L (ref 20–29)
Calcium: 10 mg/dL (ref 8.6–10.2)
Chloride: 100 mmol/L (ref 96–106)
Creatinine, Ser: 1 mg/dL (ref 0.76–1.27)
GFR calc Af Amer: 86 mL/min/{1.73_m2} (ref >59)
GFR calc non Af Amer: 74 mL/min/{1.73_m2} (ref >59)
Globulin, Total: 2.7 g/dL (ref 1.5–4.5)
Glucose: 100 mg/dL — ABNORMAL HIGH (ref 65–99)
Potassium: 4.5 mmol/L (ref 3.5–5.2)
Sodium: 137 mmol/L (ref 134–144)
Total Protein: 7.3 g/dL (ref 6.0–8.5)

## 2020-01-05 DIAGNOSIS — J4520 Mild intermittent asthma, uncomplicated: Secondary | ICD-10-CM | POA: Diagnosis not present

## 2020-01-05 DIAGNOSIS — K219 Gastro-esophageal reflux disease without esophagitis: Secondary | ICD-10-CM | POA: Diagnosis not present

## 2020-01-05 DIAGNOSIS — E785 Hyperlipidemia, unspecified: Secondary | ICD-10-CM | POA: Diagnosis not present

## 2020-01-05 DIAGNOSIS — M549 Dorsalgia, unspecified: Secondary | ICD-10-CM | POA: Diagnosis not present

## 2020-01-05 DIAGNOSIS — L089 Local infection of the skin and subcutaneous tissue, unspecified: Secondary | ICD-10-CM | POA: Diagnosis not present

## 2020-01-05 DIAGNOSIS — Z0000 Encounter for general adult medical examination without abnormal findings: Secondary | ICD-10-CM | POA: Diagnosis not present

## 2020-01-05 DIAGNOSIS — I10 Essential (primary) hypertension: Secondary | ICD-10-CM | POA: Diagnosis not present

## 2020-01-05 DIAGNOSIS — Z23 Encounter for immunization: Secondary | ICD-10-CM | POA: Diagnosis not present

## 2020-01-05 DIAGNOSIS — M109 Gout, unspecified: Secondary | ICD-10-CM | POA: Diagnosis not present

## 2020-01-29 DIAGNOSIS — K219 Gastro-esophageal reflux disease without esophagitis: Secondary | ICD-10-CM | POA: Diagnosis not present

## 2020-01-29 DIAGNOSIS — I10 Essential (primary) hypertension: Secondary | ICD-10-CM | POA: Diagnosis not present

## 2020-01-29 DIAGNOSIS — Z8546 Personal history of malignant neoplasm of prostate: Secondary | ICD-10-CM | POA: Diagnosis not present

## 2020-01-29 DIAGNOSIS — E785 Hyperlipidemia, unspecified: Secondary | ICD-10-CM | POA: Diagnosis not present

## 2020-01-29 DIAGNOSIS — M1990 Unspecified osteoarthritis, unspecified site: Secondary | ICD-10-CM | POA: Diagnosis not present

## 2020-01-29 DIAGNOSIS — I2510 Atherosclerotic heart disease of native coronary artery without angina pectoris: Secondary | ICD-10-CM | POA: Diagnosis not present

## 2020-01-29 DIAGNOSIS — J4520 Mild intermittent asthma, uncomplicated: Secondary | ICD-10-CM | POA: Diagnosis not present

## 2020-01-30 DIAGNOSIS — L905 Scar conditions and fibrosis of skin: Secondary | ICD-10-CM | POA: Diagnosis not present

## 2020-01-30 DIAGNOSIS — L821 Other seborrheic keratosis: Secondary | ICD-10-CM | POA: Diagnosis not present

## 2020-01-30 DIAGNOSIS — L570 Actinic keratosis: Secondary | ICD-10-CM | POA: Diagnosis not present

## 2020-01-30 DIAGNOSIS — D1801 Hemangioma of skin and subcutaneous tissue: Secondary | ICD-10-CM | POA: Diagnosis not present

## 2020-01-30 DIAGNOSIS — I8310 Varicose veins of unspecified lower extremity with inflammation: Secondary | ICD-10-CM | POA: Diagnosis not present

## 2020-01-30 DIAGNOSIS — Z85820 Personal history of malignant melanoma of skin: Secondary | ICD-10-CM | POA: Diagnosis not present

## 2020-02-22 DIAGNOSIS — L600 Ingrowing nail: Secondary | ICD-10-CM | POA: Diagnosis not present

## 2020-02-22 DIAGNOSIS — M25774 Osteophyte, right foot: Secondary | ICD-10-CM | POA: Diagnosis not present

## 2020-03-13 DIAGNOSIS — L600 Ingrowing nail: Secondary | ICD-10-CM | POA: Diagnosis not present

## 2020-03-17 DIAGNOSIS — Z23 Encounter for immunization: Secondary | ICD-10-CM | POA: Diagnosis not present

## 2020-03-28 DIAGNOSIS — K219 Gastro-esophageal reflux disease without esophagitis: Secondary | ICD-10-CM | POA: Diagnosis not present

## 2020-03-28 DIAGNOSIS — J4520 Mild intermittent asthma, uncomplicated: Secondary | ICD-10-CM | POA: Diagnosis not present

## 2020-03-28 DIAGNOSIS — I10 Essential (primary) hypertension: Secondary | ICD-10-CM | POA: Diagnosis not present

## 2020-03-28 DIAGNOSIS — M1990 Unspecified osteoarthritis, unspecified site: Secondary | ICD-10-CM | POA: Diagnosis not present

## 2020-03-28 DIAGNOSIS — Z8546 Personal history of malignant neoplasm of prostate: Secondary | ICD-10-CM | POA: Diagnosis not present

## 2020-03-28 DIAGNOSIS — I2510 Atherosclerotic heart disease of native coronary artery without angina pectoris: Secondary | ICD-10-CM | POA: Diagnosis not present

## 2020-03-28 DIAGNOSIS — E785 Hyperlipidemia, unspecified: Secondary | ICD-10-CM | POA: Diagnosis not present

## 2020-05-12 ENCOUNTER — Encounter (INDEPENDENT_AMBULATORY_CARE_PROVIDER_SITE_OTHER): Payer: Self-pay

## 2020-05-23 ENCOUNTER — Other Ambulatory Visit: Payer: Self-pay

## 2020-05-23 ENCOUNTER — Ambulatory Visit (INDEPENDENT_AMBULATORY_CARE_PROVIDER_SITE_OTHER): Payer: Medicare Other | Admitting: Family Medicine

## 2020-05-23 ENCOUNTER — Encounter (INDEPENDENT_AMBULATORY_CARE_PROVIDER_SITE_OTHER): Payer: Self-pay | Admitting: Family Medicine

## 2020-05-23 VITALS — BP 135/81 | HR 80 | Temp 98.2°F | Ht 67.0 in | Wt >= 6400 oz

## 2020-05-23 DIAGNOSIS — E46 Unspecified protein-calorie malnutrition: Secondary | ICD-10-CM

## 2020-05-23 DIAGNOSIS — R0602 Shortness of breath: Secondary | ICD-10-CM | POA: Diagnosis not present

## 2020-05-23 DIAGNOSIS — E559 Vitamin D deficiency, unspecified: Secondary | ICD-10-CM | POA: Diagnosis not present

## 2020-05-23 DIAGNOSIS — Z1331 Encounter for screening for depression: Secondary | ICD-10-CM | POA: Diagnosis not present

## 2020-05-23 DIAGNOSIS — R5383 Other fatigue: Secondary | ICD-10-CM

## 2020-05-23 DIAGNOSIS — R739 Hyperglycemia, unspecified: Secondary | ICD-10-CM

## 2020-05-23 DIAGNOSIS — E639 Nutritional deficiency, unspecified: Secondary | ICD-10-CM | POA: Insufficient documentation

## 2020-05-23 DIAGNOSIS — E7800 Pure hypercholesterolemia, unspecified: Secondary | ICD-10-CM | POA: Diagnosis not present

## 2020-05-23 DIAGNOSIS — R5381 Other malaise: Secondary | ICD-10-CM | POA: Insufficient documentation

## 2020-05-23 DIAGNOSIS — I10 Essential (primary) hypertension: Secondary | ICD-10-CM

## 2020-05-23 DIAGNOSIS — Z6841 Body Mass Index (BMI) 40.0 and over, adult: Secondary | ICD-10-CM

## 2020-05-23 DIAGNOSIS — E7849 Other hyperlipidemia: Secondary | ICD-10-CM

## 2020-05-23 DIAGNOSIS — Z0289 Encounter for other administrative examinations: Secondary | ICD-10-CM

## 2020-05-24 LAB — COMPREHENSIVE METABOLIC PANEL
ALT: 22 IU/L (ref 0–44)
AST: 26 IU/L (ref 0–40)
Albumin/Globulin Ratio: 1.5 (ref 1.2–2.2)
Albumin: 4.4 g/dL (ref 3.7–4.7)
Alkaline Phosphatase: 65 IU/L (ref 44–121)
BUN/Creatinine Ratio: 23 (ref 10–24)
BUN: 22 mg/dL (ref 8–27)
Bilirubin Total: 0.6 mg/dL (ref 0.0–1.2)
CO2: 23 mmol/L (ref 20–29)
Calcium: 9.9 mg/dL (ref 8.6–10.2)
Chloride: 100 mmol/L (ref 96–106)
Creatinine, Ser: 0.97 mg/dL (ref 0.76–1.27)
GFR calc Af Amer: 89 mL/min/{1.73_m2} (ref >59)
GFR calc non Af Amer: 77 mL/min/{1.73_m2} (ref >59)
Globulin, Total: 2.9 g/dL (ref 1.5–4.5)
Glucose: 95 mg/dL (ref 65–99)
Potassium: 4.8 mmol/L (ref 3.5–5.2)
Sodium: 141 mmol/L (ref 134–144)
Total Protein: 7.3 g/dL (ref 6.0–8.5)

## 2020-05-24 LAB — CBC WITH DIFFERENTIAL/PLATELET
Basophils Absolute: 0.1 10*3/uL (ref 0.0–0.2)
Basos: 1 %
EOS (ABSOLUTE): 0.1 10*3/uL (ref 0.0–0.4)
Eos: 1 %
Hematocrit: 38.8 % (ref 37.5–51.0)
Hemoglobin: 12.5 g/dL — ABNORMAL LOW (ref 13.0–17.7)
Immature Grans (Abs): 0 10*3/uL (ref 0.0–0.1)
Immature Granulocytes: 0 %
Lymphocytes Absolute: 1.6 10*3/uL (ref 0.7–3.1)
Lymphs: 19 %
MCH: 27.5 pg (ref 26.6–33.0)
MCHC: 32.2 g/dL (ref 31.5–35.7)
MCV: 85 fL (ref 79–97)
Monocytes Absolute: 0.6 10*3/uL (ref 0.1–0.9)
Monocytes: 7 %
Neutrophils Absolute: 6.2 10*3/uL (ref 1.4–7.0)
Neutrophils: 72 %
Platelets: 265 10*3/uL (ref 150–450)
RBC: 4.55 x10E6/uL (ref 4.14–5.80)
RDW: 14.9 % (ref 11.6–15.4)
WBC: 8.6 10*3/uL (ref 3.4–10.8)

## 2020-05-24 LAB — TSH: TSH: 0.765 u[IU]/mL (ref 0.450–4.500)

## 2020-05-24 LAB — T4: T4, Total: 8.8 ug/dL (ref 4.5–12.0)

## 2020-05-24 LAB — LIPID PANEL WITH LDL/HDL RATIO
Cholesterol, Total: 136 mg/dL (ref 100–199)
HDL: 67 mg/dL (ref >39)
LDL Chol Calc (NIH): 53 mg/dL (ref 0–99)
LDL/HDL Ratio: 0.8 ratio (ref 0.0–3.6)
Triglycerides: 85 mg/dL (ref 0–149)
VLDL Cholesterol Cal: 16 mg/dL (ref 5–40)

## 2020-05-24 LAB — VITAMIN D 25 HYDROXY (VIT D DEFICIENCY, FRACTURES): Vit D, 25-Hydroxy: 42.7 ng/mL (ref 30.0–100.0)

## 2020-05-24 LAB — FOLATE: Folate: 11.8 ng/mL (ref >3.0)

## 2020-05-24 LAB — VITAMIN B12: Vitamin B-12: >2000 pg/mL — ABNORMAL HIGH (ref 232–1245)

## 2020-05-24 LAB — HEMOGLOBIN A1C
Est. average glucose Bld gHb Est-mCnc: 114 mg/dL
Hgb A1c MFr Bld: 5.6 % (ref 4.8–5.6)

## 2020-05-24 LAB — INSULIN, RANDOM: INSULIN: 22.2 u[IU]/mL (ref 2.6–24.9)

## 2020-05-24 LAB — T3: T3, Total: 114 ng/dL (ref 71–180)

## 2020-05-27 NOTE — Progress Notes (Signed)
Chief Complaint:   OBESITY Curtis Hill (MR# [id number]) is a 75 y.o. male who presents for evaluation and treatment of obesity and related comorbidities. Current BMI is Body mass index is 68.13 kg/m. Curtis Hill has been struggling with his weight for many years and has been unsuccessful in either losing weight, maintaining weight loss, or reaching his healthy weight goal.  Curtis Hill is currently in the action stage of change and ready to dedicate time achieving and maintaining a healthier weight. Curtis Hill is interested in becoming our patient and working on intensive lifestyle modifications including (but not limited to) diet and exercise for weight loss.  Curtis Hill's habits were reviewed today and are as follows: His family eats meals together, his desired weight loss is 160 lbs, he has been heavy most of his life, he started gaining weight with less mobility, his heaviest weight ever was 450 pounds, he has significant food cravings issues and he snacks frequently in the evenings.  Depression Screen Curtis Hill's Food and Mood (modified PHQ-9) score was 6.  Depression screen [facility] 2/9 05/23/2020  Decreased Interest 3  Down, Depressed, Hopeless 0  PHQ - 2 Score 3  Altered sleeping 0  Tired, decreased energy 0  Change in appetite 0  Feeling bad or failure about yourself  0  Trouble concentrating 0  Moving slowly or fidgety/restless 3  Suicidal thoughts 0  PHQ-9 Score 6  Difficult doing work/chores Not difficult at all   Subjective:   1. Other fatigue Ward admits to daytime somnolence and denies waking up still tired. Patent has a history of symptoms of daytime fatigue. Curtis Hill generally gets 5 or 6 hours of sleep per night, and states that he has generally restful sleep. Snoring is present. Apneic episodes are present. Epworth Sleepiness Score is 10.  2. Shortness of breath on exertion Curtis Hill notes increasing shortness of breath with exercising and seems to be worsening over time with weight gain. He notes  getting out of breath sooner with activity than he used to. This has not gotten worse recently. Curtis Hill denies shortness of breath at rest or orthopnea.  3. Hyperglycemia Curtis Hill has a history of slightly elevated glucose in Epic.  4. Hyperlipidemia, pure Curtis Hill is on Lipitor and his recent labs look good. He is working on diet and exercise to help improve his health.  5. Primary hypertension Curtis Hill's blood pressure is controlled on his medicine, and he denies chest pain.  6. Vitamin D deficiency Curtis Hill is on Vit D OTC, and he has no recent labs in Epic. He notes fatigue.  7. Protein-calorie malnutrition, unspecified severity ([place])  Curtis Hill has multiple health conditions, which can lead to imbalanced protein requirements.  Assessment/Plan:   1. Other fatigue Curtis Hill does feel that his weight is causing his energy to be lower than it should be. Fatigue may be related to obesity, depression or many other causes. Labs will be ordered, and in the meanwhile, Curtis Hill will focus on self care including making healthy food choices, increasing physical activity and focusing on stress reduction.  - CBC with Differential/Platelet - EKG 12-Lead - T3 - T4 - TSH  2. Shortness of breath on exertion Curtis Hill does feel that he gets out of breath more easily that he used to when he exercises. Curtis Hill's shortness of breath appears to be obesity related and exercise induced. He has agreed to work on weight loss and gradually increase exercise to treat his exercise induced shortness of breath. Will continue to monitor closely.  3. Hyperglycemia Fasting labs will be obtained today, and results with be discussed with Curtis Hill in 2 weeks at his follow up visit. In the meanwhile Curtis Hill will start his Category 3 plan and will work on weight loss efforts.  - Hemoglobin A1c - Insulin, random  4. Hyperlipidemia, pure Cardiovascular risk and specific lipid/LDL goals reviewed. We discussed several lifestyle modifications today. We will check  labs today. Curtis Hill will start his Category 3 plan, and will work on exercise and weight loss efforts. Orders and follow up as documented in patient record.   - Lipid Panel With LDL/HDL Ratio  5. Primary hypertension We will check labs today. Curtis Hill will start his Category 3 plan, and will continue working on healthy weight loss and exercise to improve blood pressure control. We will watch for signs of hypotension as he continues his lifestyle modifications.  - Comprehensive metabolic panel  6. Vitamin D deficiency Low Vitamin D level contributes to fatigue and are associated with obesity, breast, and colon cancer. We will check labs today. Curtis Hill will follow-up for routine testing of Vitamin D, at least 2-3 times per year to avoid over-replacement.  - VITAMIN D 25 Hydroxy (Vit-D Deficiency, Fractures)  7. Protein-calorie malnutrition, unspecified severity (Curtis Hill)  We will check labs today, and will follow up to evaluate what percentage of macronutrients is correct for him.  - Vitamin B12 - Folate  8. Depression screening Curtis Hill had a positive depression screening. Depression is commonly associated with obesity and often results in emotional eating behaviors. We will monitor this closely and work on CBT to help improve the non-hunger eating patterns. Referral to Psychology may be required if no improvement is seen as he continues in our clinic.  9. Class 3 severe obesity with serious comorbidity and body mass index (BMI) of 60.0 to 69.9 in adult, unspecified obesity type (HCC) Curtis Hill is currently in the action stage of change and his goal is to continue with weight loss efforts. I recommend Curtis Hill begin the structured treatment plan as follows:  He has agreed to the Category 3 Plan.  Exercise goals: No exercise has been prescribed at this time.   Behavioral modification strategies: increasing lean protein intake, meal planning and cooking strategies and planning for success.  He was informed of the  importance of frequent follow-up visits to maximize his success with intensive lifestyle modifications for his multiple health conditions. He was informed we would discuss his lab results at his next visit unless there is a critical issue that needs to be addressed sooner. Curtis Hill agreed to keep his next visit at the agreed upon time to discuss these results.  Objective:   Blood pressure 135/81, pulse 80, temperature 98.2 F (36.8 C), height 5\' 7"  (1.702 m), weight (!) 435 lb (197.3 kg), SpO2 98 %. Body mass index is 68.13 kg/m.  EKG: Normal sinus rhythm, rate 82 BPM.  Indirect Calorimeter completed today shows a VO2 of 407 and a REE of 2836.  His calculated basal metabolic rate is 0272 thus his basal metabolic rate is better than expected.  General: Cooperative, alert, well developed, in no acute distress. HEENT: Conjunctivae and lids unremarkable. Cardiovascular: Regular rhythm.  Lungs: Normal work of breathing. Neurologic: No focal deficits.   Lab Results  Component Value Date   CREATININE 0.97 05/23/2020   BUN 22 05/23/2020   NA 141 05/23/2020   K 4.8 05/23/2020   CL 100 05/23/2020   CO2 23 05/23/2020   Lab Results  Component Value Date  ALT 22 05/23/2020   AST 26 05/23/2020   ALKPHOS 65 05/23/2020   BILITOT 0.6 05/23/2020   Lab Results  Component Value Date   HGBA1C 5.6 05/23/2020   HGBA1C  01/30/2007    5.8 (NOTE)   The ADA recommends the following therapeutic goals for glycemic   control related to Hgb A1C measurement:   Goal of Therapy:   < 7.0% Hgb A1C   Action Suggested:  > 8.0% Hgb A1C   Ref:  Diabetes Care, 22, Suppl. 1, 1999   Lab Results  Component Value Date   INSULIN 22.2 05/23/2020   Lab Results  Component Value Date   TSH 0.765 05/23/2020   Lab Results  Component Value Date   CHOL 136 05/23/2020   HDL 67 05/23/2020   LDLCALC 53 05/23/2020   TRIG 85 05/23/2020   CHOLHDL 1.8 11/28/2019   Lab Results  Component Value Date   WBC 8.6 05/23/2020    HGB 12.5 (L) 05/23/2020   HCT 38.8 05/23/2020   MCV 85 05/23/2020   PLT 265 05/23/2020   Lab Results  Component Value Date   IRON 39 (L) 03/15/2007   TIBC 346 03/15/2007   FERRITIN 193 03/15/2007   Obesity Behavioral Intervention:   Approximately 15 minutes were spent on the discussion below.  ASK: We discussed the diagnosis of obesity with Curtis Hill today and Jayziah agreed to give [place] permission to discuss obesity behavioral modification therapy today.  ASSESS: Niklaus has the diagnosis of obesity and his BMI today is 68.25. Lemmie is in the action stage of change.   ADVISE: Rio was educated on the multiple health risks of obesity as well as the benefit of weight loss to improve his health. He was advised of the need for long term treatment and the importance of lifestyle modifications to improve his current health and to decrease his risk of future health problems.  AGREE: Multiple dietary modification options and treatment options were discussed and Cris agreed to follow the recommendations documented in the above note.  ARRANGE: Jamicheal was educated on the importance of frequent visits to treat obesity as outlined per CMS and USPSTF guidelines and agreed to schedule his next follow up appointment today.  Attestation Statements:   Reviewed by clinician on day of visit: allergies, medications, problem list, medical history, surgical history, family history, social history, and previous encounter notes.   I, Trixie Dredge, am acting as transcriptionist for Dennard Nip, MD.  I have reviewed the above documentation for accuracy and completeness, and I agree with the above. - Dennard Nip, MD

## 2020-06-06 ENCOUNTER — Encounter (INDEPENDENT_AMBULATORY_CARE_PROVIDER_SITE_OTHER): Payer: Self-pay | Admitting: Family Medicine

## 2020-06-06 ENCOUNTER — Ambulatory Visit (INDEPENDENT_AMBULATORY_CARE_PROVIDER_SITE_OTHER): Payer: Medicare Other | Admitting: Family Medicine

## 2020-06-06 ENCOUNTER — Other Ambulatory Visit: Payer: Self-pay

## 2020-06-06 VITALS — BP 119/78 | HR 97 | Temp 98.0°F | Ht 67.0 in | Wt >= 6400 oz

## 2020-06-06 DIAGNOSIS — E7849 Other hyperlipidemia: Secondary | ICD-10-CM | POA: Diagnosis not present

## 2020-06-06 DIAGNOSIS — R748 Abnormal levels of other serum enzymes: Secondary | ICD-10-CM

## 2020-06-06 DIAGNOSIS — E8881 Metabolic syndrome: Secondary | ICD-10-CM

## 2020-06-06 DIAGNOSIS — Z6841 Body Mass Index (BMI) 40.0 and over, adult: Secondary | ICD-10-CM | POA: Diagnosis not present

## 2020-06-06 DIAGNOSIS — E559 Vitamin D deficiency, unspecified: Secondary | ICD-10-CM

## 2020-06-10 NOTE — Progress Notes (Signed)
Chief Complaint:   OBESITY Curtis Hill is here to discuss his progress with his obesity treatment plan along with follow-up of his obesity related diagnoses. Curtis Hill is on the Category 3 Plan and states he is following his eating plan approximately 100% of the time. Curtis Hill states he is doing 0 minutes 0 times per week.  Today's visit was #: 2 Starting weight: 435 lbs Starting date: 05/23/2020 Today's weight: 423 lbs Today's date: 06/06/2020 Total lbs lost to date: 12 Total lbs lost since last in-office visit: 12  Interim History: Curtis Hill has done very well with weight loss on his Category 3 plan. His hunger was mostly controlled and he did well with meal planning and eating all the food on his.  Subjective:   1. Elevated vitamin B12 level Curtis Hill is on B12, but his level is over-replaced. He is also now on a B12 rich diet. I discussed labs with the patient today.  2. Vitamin D deficiency Curtis Hill is on Vit D, and his level is not yet at goal but is getting close. I discussed labs with the patient today.  3. Insulin resistance Curtis Hill's A1c and glucose are within normal limits, but his fasting insulin is higher than expected. Polyphagia was noted but has improved with his Category 3 plan. I discussed labs with the patient today.  4. Other hyperlipidemia Curtis Hill's cholesterol is very well controlled on Lipitor. He has a history of left ventricular hypertrophy. I discussed labs with the patient today.  Assessment/Plan:   1. Elevated vitamin B12 level The diagnosis was reviewed with the patient. Curtis Hill is to decrease B12 to 2 times per week. We will recheck labs in 3 months. Orders and follow up as documented in patient record.  2. Vitamin D deficiency Low Vitamin D level contributes to fatigue and are associated with obesity, breast, and colon cancer. Curtis Hill agreed to continue taking Vitamin D3 and we will recheck labs in 3 months. He will follow-up for routine testing of Vitamin D, at least 2-3 times per year  to avoid over-replacement.  3. Insulin resistance Curtis Hill will continue his Category 3 plan, and will continue to work on weight loss, exercise, and decreasing simple carbohydrates to help decrease the risk of diabetes. We will recheck labs in 3 months. Curtis Hill agreed to follow-up with [place] as directed to closely monitor his progress.  4. Other hyperlipidemia Cardiovascular risk and specific lipid/LDL goals reviewed. We discussed several lifestyle modifications today and Curtis Hill will continue his Category 3 plan, and will continue to work on exercise and weight loss efforts. We will recheck labs in 3 months. Orders and follow up as documented in patient record.   5. 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 As such, his goal is to continue with weight loss efforts. He has agreed to the Category 3 Plan with breakfast options.   Behavioral modification strategies: increasing lean protein intake and meal planning and cooking strategies.  Curtis Hill has agreed to follow-up with our clinic in 2 weeks. He was informed of the importance of frequent follow-up visits to maximize his success with intensive lifestyle modifications for his multiple health conditions.   Objective:   Blood pressure 119/78, pulse 97, temperature 98 F (36.7 C), height 5\' 7"  (1.702 m), weight (!) 423 lb (191.9 kg), SpO2 96 %. Body mass index is 66.25 kg/m.  General: Cooperative,  alert, well developed, in no acute distress. HEENT: Conjunctivae and lids unremarkable. Cardiovascular: Regular rhythm.  Lungs: Normal work of breathing. Neurologic: No focal deficits.   Lab Results  Component Value Date   CREATININE 0.97 05/23/2020   BUN 22 05/23/2020   NA 141 05/23/2020   K 4.8 05/23/2020   CL 100 05/23/2020   CO2 23 05/23/2020   Lab Results  Component Value Date   ALT 22 05/23/2020   AST 26  05/23/2020   ALKPHOS 65 05/23/2020   BILITOT 0.6 05/23/2020   Lab Results  Component Value Date   HGBA1C 5.6 05/23/2020   HGBA1C  01/30/2007    5.8 (NOTE)   The ADA recommends the following therapeutic goals for glycemic   control related to Hgb A1C measurement:   Goal of Therapy:   < 7.0% Hgb A1C   Action Suggested:  > 8.0% Hgb A1C   Ref:  Diabetes Care, 22, Suppl. 1, 1999   Lab Results  Component Value Date   INSULIN 22.2 05/23/2020   Lab Results  Component Value Date   TSH 0.765 05/23/2020   Lab Results  Component Value Date   CHOL 136 05/23/2020   HDL 67 05/23/2020   LDLCALC 53 05/23/2020   TRIG 85 05/23/2020   CHOLHDL 1.8 11/28/2019   Lab Results  Component Value Date   WBC 8.6 05/23/2020   HGB 12.5 (L) 05/23/2020   HCT 38.8 05/23/2020   MCV 85 05/23/2020   PLT 265 05/23/2020   Lab Results  Component Value Date   IRON 39 (L) 03/15/2007   TIBC 346 03/15/2007   FERRITIN 193 03/15/2007   Attestation Statements:   Reviewed by clinician on day of visit: allergies, medications, problem list, medical history, surgical history, family history, social history, and previous encounter notes.  Time spent on visit including pre-visit chart review and post-visit care and charting was 45 minutes.    I, Trixie Dredge, am acting as transcriptionist for Dennard Nip, MD.  I have reviewed the above documentation for accuracy and completeness, and I agree with the above. -  Dennard Nip, MD

## 2020-06-18 DIAGNOSIS — H18523 Epithelial (juvenile) corneal dystrophy, bilateral: Secondary | ICD-10-CM | POA: Diagnosis not present

## 2020-06-18 DIAGNOSIS — E119 Type 2 diabetes mellitus without complications: Secondary | ICD-10-CM | POA: Diagnosis not present

## 2020-06-20 ENCOUNTER — Encounter (INDEPENDENT_AMBULATORY_CARE_PROVIDER_SITE_OTHER): Payer: Self-pay | Admitting: Family Medicine

## 2020-06-20 ENCOUNTER — Ambulatory Visit (INDEPENDENT_AMBULATORY_CARE_PROVIDER_SITE_OTHER): Payer: Medicare Other | Admitting: Family Medicine

## 2020-06-20 ENCOUNTER — Other Ambulatory Visit: Payer: Self-pay

## 2020-06-20 VITALS — BP 138/82 | HR 90 | Temp 98.1°F | Ht 67.0 in | Wt >= 6400 oz

## 2020-06-20 DIAGNOSIS — Z6841 Body Mass Index (BMI) 40.0 and over, adult: Secondary | ICD-10-CM | POA: Diagnosis not present

## 2020-06-20 DIAGNOSIS — R7303 Prediabetes: Secondary | ICD-10-CM | POA: Diagnosis not present

## 2020-06-24 ENCOUNTER — Encounter (INDEPENDENT_AMBULATORY_CARE_PROVIDER_SITE_OTHER): Payer: Self-pay | Admitting: Family Medicine

## 2020-06-24 NOTE — Progress Notes (Signed)
Chief Complaint:   OBESITY Curtis Hill is here to discuss his progress with his obesity treatment plan along with follow-up of his obesity related diagnoses. Curtis Hill is on the Category 3 Plan and states he is following his eating plan approximately 100% of the time. Curtis Hill states he is doing 0 minutes 0 times per week.  Today's visit was #: 3 Starting weight: 435 lbs Starting date: 05/23/2020 Today's weight: 419 lbs Today's date: 06/20/2020 Total lbs lost to date: 16 lbs Total lbs lost since last in-office visit: 4 lbs  Interim History: Curtis Hill is doing very well on the plan. He is even eating out and doing veryw ell with food choices-plain meat and vegetables. He does struggle to eat all of the meat at supper but he does eat it all. He is tracking everything with My [employer]itness Pal. His hunger is satisfied.   Subjective:   1. Pre-diabetes Curtis Hill denies polyphagia. His last A1c was 5.6. He is not on Metformin.   Lab Results  Component Value Date   HGBA1C 5.6 05/23/2020   Lab Results  Component Value Date   INSULIN 22.2 05/23/2020    Assessment/Plan:   1. Pre-diabetes Continue meal plan.  2. 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 As such, his goal is to continue with weight loss efforts. He has agreed to the Category 3 Plan.   Handouts: Lunch Options, Protein Equivalents, Spices  Exercise goals: As is  Behavioral modification strategies: meal planning and cooking strategies.  Curtis Hill has agreed to follow-up with our clinic in 3 weeks with Dr. Leafy Ro and 5 weeks with Curtis Hill .   Objective:   Blood pressure 138/82, pulse 90, temperature 98.1 F (36.7 C), height 5\' 7"  (1.702 m), weight (!) 419 lb (190.1 kg), SpO2 96 %. Body mass index is 65.62 kg/m.  General: Cooperative, alert, well developed, in no acute distress. HEENT: Conjunctivae and lids  unremarkable. Cardiovascular: Regular rhythm.  Lungs: Normal work of breathing. Neurologic: No focal deficits.   Lab Results  Component Value Date   CREATININE 0.97 05/23/2020   BUN 22 05/23/2020   NA 141 05/23/2020   K 4.8 05/23/2020   CL 100 05/23/2020   CO2 23 05/23/2020   Lab Results  Component Value Date   ALT 22 05/23/2020   AST 26 05/23/2020   ALKPHOS 65 05/23/2020   BILITOT 0.6 05/23/2020   Lab Results  Component Value Date   HGBA1C 5.6 05/23/2020   HGBA1C  01/30/2007    5.8 (NOTE)   The ADA recommends the following therapeutic goals for glycemic   control related to Hgb A1C measurement:   Goal of Therapy:   < 7.0% Hgb A1C   Action Suggested:  > 8.0% Hgb A1C   Ref:  Diabetes Care, 22, Suppl. 1, 1999   Lab Results  Component Value Date   INSULIN 22.2 05/23/2020   Lab Results  Component Value Date   TSH 0.765 05/23/2020   Lab Results  Component Value Date   CHOL 136 05/23/2020   HDL 67 05/23/2020   LDLCALC 53 05/23/2020   TRIG 85 05/23/2020   CHOLHDL 1.8 11/28/2019   Lab Results  Component Value Date   WBC 8.6 05/23/2020   HGB 12.5 (L) 05/23/2020   HCT 38.8 05/23/2020   MCV 85 05/23/2020   PLT 265 05/23/2020   Lab  Results  Component Value Date   IRON 39 (L) 03/15/2007   TIBC 346 03/15/2007   FERRITIN 193 03/15/2007    Obesity Behavioral Intervention:   Approximately 15 minutes were spent on the discussion below.  ASK: We discussed the diagnosis of obesity with Curtis Hill today and Curtis Hill agreed to give [place] permission to discuss obesity behavioral modification therapy today.  ASSESS: Curtis Hill has the diagnosis of obesity and his BMI today is 65.7. Macky is in the action stage of change.   ADVISE: Curtis Hill was educated on the multiple health risks of obesity as well as the benefit of weight loss to improve his health. He was advised of the need for long term treatment and the importance of lifestyle modifications to improve his current health and to decrease  his risk of future health problems.  AGREE: Multiple dietary modification options and treatment options were discussed and Curtis Hill agreed to follow the recommendations documented in the above note.  ARRANGE: Curtis Hill was educated on the importance of frequent visits to treat obesity as outlined per CMS and USPSTF guidelines and agreed to schedule his next follow up appointment today.  Attestation Statements:   Reviewed by clinician on day of visit: allergies, medications, problem list, medical history, surgical history, family history, social history, and previous encounter notes.  Coral Ceo, am acting as Location manager for [employer], [place].  I have reviewed the above documentation for accuracy and completeness, and I agree with the above. -  Georgianne Fick, FNP

## 2020-07-05 DIAGNOSIS — M109 Gout, unspecified: Secondary | ICD-10-CM | POA: Diagnosis not present

## 2020-07-05 DIAGNOSIS — Z125 Encounter for screening for malignant neoplasm of prostate: Secondary | ICD-10-CM | POA: Diagnosis not present

## 2020-07-05 DIAGNOSIS — M549 Dorsalgia, unspecified: Secondary | ICD-10-CM | POA: Diagnosis not present

## 2020-07-10 ENCOUNTER — Encounter (INDEPENDENT_AMBULATORY_CARE_PROVIDER_SITE_OTHER): Payer: Self-pay | Admitting: Family Medicine

## 2020-07-10 ENCOUNTER — Other Ambulatory Visit: Payer: Self-pay

## 2020-07-10 ENCOUNTER — Ambulatory Visit (INDEPENDENT_AMBULATORY_CARE_PROVIDER_SITE_OTHER): Payer: Medicare Other | Admitting: Family Medicine

## 2020-07-10 VITALS — BP 124/77 | HR 98 | Temp 98.5°F | Ht 67.0 in | Wt >= 6400 oz

## 2020-07-10 DIAGNOSIS — I10 Essential (primary) hypertension: Secondary | ICD-10-CM | POA: Diagnosis not present

## 2020-07-10 DIAGNOSIS — Z6841 Body Mass Index (BMI) 40.0 and over, adult: Secondary | ICD-10-CM

## 2020-07-10 DIAGNOSIS — E7849 Other hyperlipidemia: Secondary | ICD-10-CM

## 2020-07-17 NOTE — Progress Notes (Signed)
Chief Complaint:   OBESITY Curtis Hill is here to discuss his progress with his obesity treatment plan along with follow-up of his obesity related diagnoses. Curtis Hill is on the Category 3 Plan and states he is following his eating plan approximately 100% of the time. Curtis Hill states he is doing 0 minutes 0 times per week.  Today's visit was #: 4 Starting weight: 435 lbs Starting date: 05/23/2020 Today's weight: 409 lbs Today's date: 07/10/2020 Total lbs lost to date: 26 Total lbs lost since last in-office visit: 10  Interim History: Curtis Hill continues to do well with weight loss on his Category 3 plan. His hunger is controlled and he is doing well with meal planning. When he makes substitutions he is mindful of his choices.  Subjective:   1. Other hyperlipidemia Curtis Hill is doing well with diet. He is on Lipitor as well, and he denies chest pain.  2. Essential hypertension Curtis Hill's blood pressure is well controlled on his medications. He is doing very well with diet and weight loss. He denies signs of hypotension.  Assessment/Plan:   1. Other hyperlipidemia Cardiovascular risk and specific lipid/LDL goals reviewed. We discussed several lifestyle modifications today. Curtis Hill will continue Lipitor, diet, exercise and weight loss efforts. The goal is to control his cholesterol with less medication. Orders and follow up as documented in patient record.   2. Essential hypertension Curtis Hill will continue diet, exercise, and weight loss to improve blood pressure control. We will watch for signs of hypotension as he continues his lifestyle modifications.  3. 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 As such, his goal is to continue with weight loss efforts. He has agreed to the Category 3 Plan.   Exercise goals: Start stretch bands and weights.  Behavioral modification  strategies: increasing lean protein intake and meal planning and cooking strategies.  Curtis Hill has agreed to follow-up with our clinic in 2 weeks. He was informed of the importance of frequent follow-up visits to maximize his success with intensive lifestyle modifications for his multiple health conditions.   Objective:   Blood pressure 124/77, pulse 98, temperature 98.5 F (36.9 C), height 5\' 7"  (1.702 m), weight (!) 409 lb (185.5 kg), SpO2 97 %. Body mass index is 64.06 kg/m.  General: Cooperative, alert, well developed, in no acute distress. HEENT: Conjunctivae and lids unremarkable. Cardiovascular: Regular rhythm.  Lungs: Normal work of breathing. Neurologic: No focal deficits.   Lab Results  Component Value Date   CREATININE 0.97 05/23/2020   BUN 22 05/23/2020   NA 141 05/23/2020   K 4.8 05/23/2020   CL 100 05/23/2020   CO2 23 05/23/2020   Lab Results  Component Value Date   ALT 22 05/23/2020   AST 26 05/23/2020   ALKPHOS 65 05/23/2020   BILITOT 0.6 05/23/2020   Lab Results  Component Value Date   HGBA1C 5.6 05/23/2020   HGBA1C  01/30/2007    5.8 (NOTE)   The ADA recommends the following therapeutic goals for glycemic   control related to Hgb A1C measurement:   Goal of Therapy:   < 7.0% Hgb A1C   Action Suggested:  > 8.0% Hgb A1C   Ref:  Diabetes Care, 22, Suppl. 1, 1999   Lab Results  Component Value Date   INSULIN 22.2 05/23/2020   Lab Results  Component Value Date   TSH 0.765  05/23/2020   Lab Results  Component Value Date   CHOL 136 05/23/2020   HDL 67 05/23/2020   LDLCALC 53 05/23/2020   TRIG 85 05/23/2020   CHOLHDL 1.8 11/28/2019   Lab Results  Component Value Date   WBC 8.6 05/23/2020   HGB 12.5 (L) 05/23/2020   HCT 38.8 05/23/2020   MCV 85 05/23/2020   PLT 265 05/23/2020   Lab Results  Component Value Date   IRON 39 (L) 03/15/2007   TIBC 346 03/15/2007   FERRITIN 193 03/15/2007   Attestation Statements:   Reviewed by clinician on day of  visit: allergies, medications, problem list, medical history, surgical history, family history, social history, and previous encounter notes.  Time spent on visit including pre-visit chart review and post-visit care and charting was 32 minutes.    I, Trixie Dredge, am acting as transcriptionist for Dennard Nip, MD.  I have reviewed the above documentation for accuracy and completeness, and I agree with the above. -  Dennard Nip, MD

## 2020-07-25 ENCOUNTER — Other Ambulatory Visit: Payer: Self-pay

## 2020-07-25 ENCOUNTER — Ambulatory Visit (INDEPENDENT_AMBULATORY_CARE_PROVIDER_SITE_OTHER): Payer: Medicare Other | Admitting: Family Medicine

## 2020-07-25 ENCOUNTER — Encounter (INDEPENDENT_AMBULATORY_CARE_PROVIDER_SITE_OTHER): Payer: Self-pay | Admitting: Family Medicine

## 2020-07-25 VITALS — BP 131/84 | HR 98 | Temp 98.4°F | Ht 67.0 in | Wt >= 6400 oz

## 2020-07-25 DIAGNOSIS — Z6841 Body Mass Index (BMI) 40.0 and over, adult: Secondary | ICD-10-CM | POA: Diagnosis not present

## 2020-07-25 DIAGNOSIS — E65 Localized adiposity: Secondary | ICD-10-CM | POA: Diagnosis not present

## 2020-07-29 DIAGNOSIS — E65 Localized adiposity: Secondary | ICD-10-CM | POA: Insufficient documentation

## 2020-07-29 NOTE — Progress Notes (Signed)
Chief Complaint:   OBESITY Curtis Hill is here to discuss his progress with his obesity treatment plan along with follow-up of his obesity related diagnoses. Curtis Hill is on the Category 3 Plan and states he is following his eating plan approximately 90% of the time. Curtis Hill states he is not currently exercising.  Today's visit was #: 5 Starting weight: 435 lbs Starting date: 05/23/2020 Today's weight: 410 lbs Today's date: 07/25/2020 Total lbs lost to date: 25 Total lbs lost since last in-office visit: 0  Interim History: Curtis Hill reports a recent GI virus, which caused him to have GI issues that made it difficult to adhere to plan. He is now back on plan. Hunger is controlled. He is doing well with water intake. He is up several lbs of water per bioimpedance scale.  Subjective:   1. Visceral obesity Curtis Hill has a visceral obesity rating of 56. Goal is <12.   Assessment/Plan:   1. Visceral obesity Continue meal plan.  2. Obesity: Current BMI 64 Curtis Hill is currently in the action stage of change. As such, his goal is to continue with weight loss efforts. He has agreed to the Category 3 Plan.   Exercise goals: Consider starting chair exercises.  Behavioral modification strategies: increasing lean protein intake and decreasing simple carbohydrates.  Curtis Hill has agreed to follow-up with our clinic in 2-3 weeks.   Objective:   Blood pressure 131/84, pulse 98, temperature 98.4 F (36.9 C), height 5\' 7"  (1.702 m), weight (!) 410 lb (186 kg), SpO2 97 %. Body mass index is 64.22 kg/m.  General: Cooperative, alert, well developed, in no acute distress. Uses a walker for ambulation. HEENT: Conjunctivae and lids unremarkable. Cardiovascular: Regular rhythm.  Lungs: Normal work of breathing. Neurologic: No focal deficits.   Lab Results  Component Value Date   CREATININE 0.97 05/23/2020   BUN 22 05/23/2020   NA 141 05/23/2020   K 4.8 05/23/2020   CL 100 05/23/2020   CO2 23 05/23/2020   Lab  Results  Component Value Date   ALT 22 05/23/2020   AST 26 05/23/2020   ALKPHOS 65 05/23/2020   BILITOT 0.6 05/23/2020   Lab Results  Component Value Date   HGBA1C 5.6 05/23/2020   HGBA1C  01/30/2007    5.8 (NOTE)   The ADA recommends the following therapeutic goals for glycemic   control related to Hgb A1C measurement:   Goal of Therapy:   < 7.0% Hgb A1C   Action Suggested:  > 8.0% Hgb A1C   Ref:  Diabetes Care, 22, Suppl. 1, 1999   Lab Results  Component Value Date   INSULIN 22.2 05/23/2020   Lab Results  Component Value Date   TSH 0.765 05/23/2020   Lab Results  Component Value Date   CHOL 136 05/23/2020   HDL 67 05/23/2020   LDLCALC 53 05/23/2020   TRIG 85 05/23/2020   CHOLHDL 1.8 11/28/2019   Lab Results  Component Value Date   WBC 8.6 05/23/2020   HGB 12.5 (L) 05/23/2020   HCT 38.8 05/23/2020   MCV 85 05/23/2020   PLT 265 05/23/2020   Lab Results  Component Value Date   IRON 39 (L) 03/15/2007   TIBC 346 03/15/2007   FERRITIN 193 03/15/2007    Obesity Behavioral Intervention:   Approximately 15 minutes were spent on the discussion below.  ASK: We discussed the diagnosis of obesity with Curtis Hill today and Curtis Hill agreed to give [place] permission to discuss obesity behavioral modification therapy today.  ASSESS: Curtis Hill has the diagnosis of obesity and his BMI today is 64.2. Curtis Hill is in the action stage of change.   ADVISE: Curtis Hill was educated on the multiple health risks of obesity as well as the benefit of weight loss to improve his health. He was advised of the need for long term treatment and the importance of lifestyle modifications to improve his current health and to decrease his risk of future health problems.  AGREE: Multiple dietary modification options and treatment options were discussed and Curtis Hill agreed to follow the recommendations documented in the above note.  ARRANGE: Curtis Hill was educated on the importance of frequent visits to treat obesity as outlined  per CMS and USPSTF guidelines and agreed to schedule his next follow up appointment today.  Attestation Statements:   Reviewed by clinician on day of visit: allergies, medications, problem list, medical history, surgical history, family history, social history, and previous encounter notes.  Coral Ceo, am acting as Location manager for [employer], [place].  I have reviewed the above documentation for accuracy and completeness, and I agree with the above. -  Georgianne Fick, FNP

## 2020-08-15 ENCOUNTER — Ambulatory Visit (INDEPENDENT_AMBULATORY_CARE_PROVIDER_SITE_OTHER): Payer: Medicare Other | Admitting: Family Medicine

## 2020-08-15 ENCOUNTER — Encounter (INDEPENDENT_AMBULATORY_CARE_PROVIDER_SITE_OTHER): Payer: Self-pay | Admitting: Family Medicine

## 2020-08-15 ENCOUNTER — Other Ambulatory Visit: Payer: Self-pay

## 2020-08-15 VITALS — BP 135/86 | HR 100 | Temp 98.4°F | Ht 67.0 in | Wt >= 6400 oz

## 2020-08-15 DIAGNOSIS — Z6841 Body Mass Index (BMI) 40.0 and over, adult: Secondary | ICD-10-CM | POA: Diagnosis not present

## 2020-08-15 DIAGNOSIS — I10 Essential (primary) hypertension: Secondary | ICD-10-CM | POA: Insufficient documentation

## 2020-08-15 DIAGNOSIS — E559 Vitamin D deficiency, unspecified: Secondary | ICD-10-CM

## 2020-08-16 DIAGNOSIS — B9689 Other specified bacterial agents as the cause of diseases classified elsewhere: Secondary | ICD-10-CM | POA: Diagnosis not present

## 2020-08-16 DIAGNOSIS — B957 Other staphylococcus as the cause of diseases classified elsewhere: Secondary | ICD-10-CM | POA: Diagnosis not present

## 2020-08-16 DIAGNOSIS — Z1629 Resistance to other single specified antibiotic: Secondary | ICD-10-CM | POA: Diagnosis not present

## 2020-08-16 DIAGNOSIS — L03115 Cellulitis of right lower limb: Secondary | ICD-10-CM | POA: Diagnosis not present

## 2020-08-23 DIAGNOSIS — L03115 Cellulitis of right lower limb: Secondary | ICD-10-CM | POA: Diagnosis not present

## 2020-08-26 NOTE — Progress Notes (Signed)
Chief Complaint:   OBESITY Curtis Hill is here to discuss his progress with his obesity treatment plan along with follow-up of his obesity related diagnoses.   Today's visit was #: 6 Starting weight: 435 lbs Starting date: 05/23/2020 Today's weight: 407 lbs Today's date: 08/15/2020 Weight change since last visit: 3 lbs Total lbs lost to date: 28 lbs Body mass index is 63.75 kg/m.  Total weight loss percentage to date: -6.44%  Interim History:  Curtis Hill is here for a follow up office visit and he is following the meal plan without concerns or issues.  Patient's meal and food recall appears to be accurate and consistent with what is on the plan.  When on plan, his hunger and cravings are well controlled.    For dinner, Curtis Hill has pork tenderloin, chicken, skinless Curtis Hill, or 8-10 ounces of salmon with vegetables (asparagus, spinach salad).  He started with hand weights recently.  Uses [employer] Fat Free [place] with 15 calories per serving.  Current Meal Plan: the Category 3 Plan for 95% of the time.  Current Exercise Plan: Arm exercises for 570 minutes 7 times per week.  Assessment/Plan:   No orders of the defined types were placed in this encounter.   There are no discontinued medications.   No orders of the defined types were placed in this encounter.    1. Vitamin D deficiency Improving, but not optimized. Current vitamin D is 42.7, tested on 05/23/2020. Optimal goal > 50 ng/dL.  He is taking OTC vitamin D 3,000 IU daily.   Plan: Continue current OTC vitamin D supplementation.  Follow-up for routine testing of Vitamin D, at least 2-3 times per year to avoid over-replacement.  2. Essential hypertension Not at goal. Medications: valsartan 320 mg daily and Lasix 40 mg daily.   Plan:  Blood pressure is at goal.  Continue medications per PCP/specialists.  Avoid buying foods that are: processed, frozen, or prepackaged to avoid excess salt. We will continue to monitor closely  alongside his PCP and/or Specialist.  Regular follow up with PCP and specialists was also encouraged.   BP Readings from Last 3 Encounters:  08/15/20 135/86  07/25/20 131/84  07/10/20 124/77   Lab Results  Component Value Date   CREATININE 0.97 05/23/2020   3. Obesity, current BMI 63.8  Course: Curtis Hill is currently in the action stage of change. As such, his goal is to continue with weight loss efforts.   Nutrition goals: He has Hill to the Category 3 Plan.   Exercise goals: As is.  Behavioral modification strategies: meal planning and cooking strategies and planning for success.  Curtis Hill to follow-up with our clinic in 2 weeks. He was informed of the importance of frequent follow-up visits to maximize his success with intensive lifestyle modifications for his multiple health conditions.   Objective:   Blood pressure 135/86, pulse 100, temperature 98.4 F (36.9 C), height 5\' 7"  (1.702 m), weight (!) 407 lb (184.6 kg), SpO2 97 %. Body mass index is 63.75 kg/m.  General: Cooperative, alert, well developed, in no acute distress. HEENT: Conjunctivae and lids unremarkable. Cardiovascular: Regular rhythm.  Lungs: Normal work of breathing. Neurologic: No focal deficits.   Lab Results  Component Value Date   CREATININE 0.97 05/23/2020   BUN 22 05/23/2020   NA 141 05/23/2020   K 4.8 05/23/2020   CL 100 05/23/2020   CO2 23 05/23/2020   Lab Results  Component Value Date   ALT 22 05/23/2020  AST 26 05/23/2020   ALKPHOS 65 05/23/2020   BILITOT 0.6 05/23/2020   Lab Results  Component Value Date   HGBA1C 5.6 05/23/2020   HGBA1C  01/30/2007    5.8 (NOTE)   The ADA recommends the following therapeutic goals for glycemic   control related to Hgb A1C measurement:   Goal of Therapy:   < 7.0% Hgb A1C   Action Suggested:  > 8.0% Hgb A1C   Ref:  Diabetes Care, 22, Suppl. 1, 1999   Lab Results  Component Value Date   INSULIN 22.2 05/23/2020   Lab Results  Component  Value Date   TSH 0.765 05/23/2020   Lab Results  Component Value Date   CHOL 136 05/23/2020   HDL 67 05/23/2020   LDLCALC 53 05/23/2020   TRIG 85 05/23/2020   CHOLHDL 1.8 11/28/2019   Lab Results  Component Value Date   WBC 8.6 05/23/2020   HGB 12.5 (L) 05/23/2020   HCT 38.8 05/23/2020   MCV 85 05/23/2020   PLT 265 05/23/2020   Lab Results  Component Value Date   IRON 39 (L) 03/15/2007   TIBC 346 03/15/2007   FERRITIN 193 03/15/2007   Obesity Behavioral Intervention:   Approximately 15 minutes were spent on the discussion below.  ASK: We discussed the diagnosis of obesity with Curtis Hill today and Curtis Hill Hill to give [place] permission to discuss obesity behavioral modification therapy today.  ASSESS: Curtis Hill has the diagnosis of obesity and his BMI today is 63.8. Curtis Hill is in the action stage of change.   ADVISE: Curtis Hill was educated on the multiple health risks of obesity as well as the benefit of weight loss to improve his health. He was advised of the need for long term treatment and the importance of lifestyle modifications to improve his current health and to decrease his risk of future health problems.  AGREE: Multiple dietary modification options and treatment options were discussed and Hill Hill to follow the recommendations documented in the above note.  ARRANGE: Curtis Hill was educated on the importance of frequent visits to treat obesity as outlined per CMS and USPSTF guidelines and Hill to schedule his next follow up appointment today.  Attestation Statements:   Reviewed by clinician on day of visit: allergies, medications, problem list, medical history, surgical history, family history, social history, and previous encounter notes.  I, Water quality scientist, CMA, am acting as Location manager for [employer], DO.  I have reviewed the above documentation for accuracy and completeness, and I agree with the above. Marjory Sneddon, D.O.  The [place] was signed  into law in 2016 which includes the topic of electronic health records.  This provides immediate access to information in MyChart.  This includes consultation notes, operative notes, office notes, lab results and pathology reports.  If you have any questions about what you read please let us know at your next visit so we can discuss your concerns and take corrective action if need be.  We are right here with you.

## 2020-09-05 ENCOUNTER — Ambulatory Visit (INDEPENDENT_AMBULATORY_CARE_PROVIDER_SITE_OTHER): Payer: Medicare Other | Admitting: Physician Assistant

## 2020-09-05 ENCOUNTER — Encounter (INDEPENDENT_AMBULATORY_CARE_PROVIDER_SITE_OTHER): Payer: Self-pay

## 2020-09-17 DIAGNOSIS — S76911A Strain of unspecified muscles, fascia and tendons at thigh level, right thigh, initial encounter: Secondary | ICD-10-CM | POA: Diagnosis not present

## 2020-09-17 DIAGNOSIS — M79651 Pain in right thigh: Secondary | ICD-10-CM | POA: Diagnosis not present

## 2020-10-02 ENCOUNTER — Ambulatory Visit (INDEPENDENT_AMBULATORY_CARE_PROVIDER_SITE_OTHER): Payer: Medicare Other | Admitting: Physician Assistant

## 2020-10-04 DIAGNOSIS — Z8546 Personal history of malignant neoplasm of prostate: Secondary | ICD-10-CM | POA: Diagnosis not present

## 2020-10-04 DIAGNOSIS — I10 Essential (primary) hypertension: Secondary | ICD-10-CM | POA: Diagnosis not present

## 2020-10-04 DIAGNOSIS — M1990 Unspecified osteoarthritis, unspecified site: Secondary | ICD-10-CM | POA: Diagnosis not present

## 2020-10-04 DIAGNOSIS — E785 Hyperlipidemia, unspecified: Secondary | ICD-10-CM | POA: Diagnosis not present

## 2020-10-04 DIAGNOSIS — K219 Gastro-esophageal reflux disease without esophagitis: Secondary | ICD-10-CM | POA: Diagnosis not present

## 2020-10-04 DIAGNOSIS — J4520 Mild intermittent asthma, uncomplicated: Secondary | ICD-10-CM | POA: Diagnosis not present

## 2020-10-04 DIAGNOSIS — I2510 Atherosclerotic heart disease of native coronary artery without angina pectoris: Secondary | ICD-10-CM | POA: Diagnosis not present

## 2020-10-16 ENCOUNTER — Encounter (INDEPENDENT_AMBULATORY_CARE_PROVIDER_SITE_OTHER): Payer: Self-pay | Admitting: Physician Assistant

## 2020-10-16 ENCOUNTER — Ambulatory Visit (INDEPENDENT_AMBULATORY_CARE_PROVIDER_SITE_OTHER): Payer: Medicare Other | Admitting: Physician Assistant

## 2020-10-16 ENCOUNTER — Other Ambulatory Visit: Payer: Self-pay

## 2020-10-16 VITALS — HR 101 | Temp 98.1°F | Ht 67.0 in | Wt >= 6400 oz

## 2020-10-16 DIAGNOSIS — E559 Vitamin D deficiency, unspecified: Secondary | ICD-10-CM | POA: Diagnosis not present

## 2020-10-16 DIAGNOSIS — Z6841 Body Mass Index (BMI) 40.0 and over, adult: Secondary | ICD-10-CM

## 2020-10-16 DIAGNOSIS — I10 Essential (primary) hypertension: Secondary | ICD-10-CM

## 2020-10-22 NOTE — Progress Notes (Signed)
Chief Complaint:   OBESITY Curtis Hill is here to discuss his progress with his obesity treatment plan along with follow-up of his obesity related diagnoses. Sabir is on the Category 3 Plan and states he is following his eating plan approximately 90-100% of the time. Kash states he is doing 0 minutes 0 times per week.  Today's visit was #: 7 Starting weight: 435 lbs Starting date: 05/23/2020 Today's weight: 410 lbs Today's date: 10/16/2020 Total lbs lost to date: 25 Total lbs lost since last in-office visit: 0  Interim History: Wil was in bed due to leg pain until a week ago. Since then, his leg pain has subsided. He reports that he lost 20 pounds of fluid while in bed because he stopped all of his supplements. He restarted his supplements last week and, per patient, began swelling in his arms and legs again. He denies shortness of breath or orthopnea. He sees his Cardiologist, and he has no history of  congestive heart failure. His last echocardiogram in 2021 showed normal EF.  Subjective:   1. Essential hypertension Malyk's blood pressure is well controlled at home, but is slightly elevated today. He feels "swollen" today. Per patient, he has no history of congestive heart failure. His last echo in 2021 showed normal EF. He is on Lasix and Diovan.   2. Vitamin D deficiency Kalai is on Vit D OTC, and tolerating well.   Assessment/Plan:   1. Essential hypertension Gaurav will follow up with his Cardiologist, and will continue with his medications and monitoring his blood pressure. He will continue working on healthy weight loss and exercise to improve blood pressure control. We will watch for signs of hypotension as he continues his lifestyle modifications.  2. Vitamin D deficiency Low Vitamin D level contributes to fatigue and are associated with obesity, breast, and colon cancer. Darel will continue Vitamin D and will follow-up for routine testing of Vitamin D, at least 2-3 times per year to  avoid over-replacement.  3. Obesity, current BMI 64.2 Amarian is currently in the action stage of change. As such, his goal is to continue with weight loss efforts. He has agreed to the Category 4 Plan.   Izan will reintroduce vitamins once at a time to see which is causing swelling.  Exercise goals: No exercise has been prescribed at this time.  Behavioral modification strategies: meal planning and cooking strategies and keeping healthy foods in the home.  Gurtaj has agreed to follow-up with our clinic in 3 weeks. He was informed of the importance of frequent follow-up visits to maximize his success with intensive lifestyle modifications for his multiple health conditions.   Objective:   Pulse (!) 101, temperature 98.1 F (36.7 C), height 5\' 7"  (1.702 m), weight (!) 410 lb (186 kg), SpO2 97 %. Body mass index is 64.22 kg/m.  General: Cooperative, alert, well developed, in no acute distress. HEENT: Conjunctivae and lids unremarkable. Cardiovascular: Regular rhythm.  Lungs: Normal work of breathing. Neurologic: No focal deficits.   Lab Results  Component Value Date   CREATININE 0.97 05/23/2020   BUN 22 05/23/2020   NA 141 05/23/2020   K 4.8 05/23/2020   CL 100 05/23/2020   CO2 23 05/23/2020   Lab Results  Component Value Date   ALT 22 05/23/2020   AST 26 05/23/2020   ALKPHOS 65 05/23/2020   BILITOT 0.6 05/23/2020   Lab Results  Component Value Date   HGBA1C 5.6 05/23/2020   HGBA1C  01/30/2007  5.8 (NOTE)   The ADA recommends the following therapeutic goals for glycemic   control related to Hgb A1C measurement:   Goal of Therapy:   < 7.0% Hgb A1C   Action Suggested:  > 8.0% Hgb A1C   Ref:  Diabetes Care, 22, Suppl. 1, 1999   Lab Results  Component Value Date   INSULIN 22.2 05/23/2020   Lab Results  Component Value Date   TSH 0.765 05/23/2020   Lab Results  Component Value Date   CHOL 136 05/23/2020   HDL 67 05/23/2020   LDLCALC 53 05/23/2020   TRIG 85  05/23/2020   CHOLHDL 1.8 11/28/2019   Lab Results  Component Value Date   VD25OH 42.7 05/23/2020   Lab Results  Component Value Date   WBC 8.6 05/23/2020   HGB 12.5 (L) 05/23/2020   HCT 38.8 05/23/2020   MCV 85 05/23/2020   PLT 265 05/23/2020   Lab Results  Component Value Date   IRON 39 (L) 03/15/2007   TIBC 346 03/15/2007   FERRITIN 193 03/15/2007    Obesity Behavioral Intervention:   Approximately 15 minutes were spent on the discussion below.  ASK: We discussed the diagnosis of obesity with [name] today and Eero agreed to give [place] permission to discuss obesity behavioral modification therapy today.  ASSESS: Bao has the diagnosis of obesity and his BMI today is 64.2. Glenden is in the action stage of change.   ADVISE: Nadeem was educated on the multiple health risks of obesity as well as the benefit of weight loss to improve his health. He was advised of the need for long term treatment and the importance of lifestyle modifications to improve his current health and to decrease his risk of future health problems.  AGREE: Multiple dietary modification options and treatment options were discussed and Montavious agreed to follow the recommendations documented in the above note.  ARRANGE: Tasha was educated on the importance of frequent visits to treat obesity as outlined per CMS and USPSTF guidelines and agreed to schedule his next follow up appointment today.  Attestation Statements:   Reviewed by clinician on day of visit: allergies, medications, problem list, medical history, surgical history, family history, social history, and previous encounter notes.   Wilhemena Durie, am acting as transcriptionist for [employer], PA-C.  I have reviewed the above documentation for accuracy and completeness, and I agree with the above. [name], PA-C

## 2020-11-14 ENCOUNTER — Ambulatory Visit (INDEPENDENT_AMBULATORY_CARE_PROVIDER_SITE_OTHER): Payer: Medicare Other | Admitting: Family Medicine

## 2020-11-14 ENCOUNTER — Other Ambulatory Visit: Payer: Self-pay

## 2020-11-14 ENCOUNTER — Encounter (INDEPENDENT_AMBULATORY_CARE_PROVIDER_SITE_OTHER): Payer: Self-pay | Admitting: Family Medicine

## 2020-11-14 VITALS — BP 132/84 | HR 103 | Temp 98.1°F | Ht 67.0 in | Wt >= 6400 oz

## 2020-11-14 DIAGNOSIS — E559 Vitamin D deficiency, unspecified: Secondary | ICD-10-CM | POA: Diagnosis not present

## 2020-11-14 DIAGNOSIS — E538 Deficiency of other specified B group vitamins: Secondary | ICD-10-CM

## 2020-11-14 DIAGNOSIS — Z6841 Body Mass Index (BMI) 40.0 and over, adult: Secondary | ICD-10-CM | POA: Diagnosis not present

## 2020-11-14 DIAGNOSIS — I10 Essential (primary) hypertension: Secondary | ICD-10-CM

## 2020-11-14 DIAGNOSIS — R7303 Prediabetes: Secondary | ICD-10-CM | POA: Diagnosis not present

## 2020-11-18 NOTE — Progress Notes (Signed)
Chief Complaint:   OBESITY Curtis Hill is here to discuss his progress with his obesity treatment plan along with follow-up of his obesity related diagnoses. Curtis Hill is on the Category 4 Plan and states he is following his eating plan approximately 90% of the time. Curtis Hill states he is doing 0 minutes 0 times per week.  Today's visit was #: 8 Starting weight: 435 lbs Starting date: 05/23/2020 Today's weight: 400 lbs Today's date: 11/14/2020 Total lbs lost to date: 35 Total lbs lost since last in-office visit: 10  Interim History: [name] states he lost weight because he has modified the meal plan. His last office visit was 1 month ago with [name], PA-C, and this is his first office visit with me.  He tells me he can lose or gain between 10-20 pounds of fluid easily from day to day. She denies a history of congestive heart failure.  Subjective:   1. Essential hypertension Curtis Hill's blood pressure at home ranges between 110-120's/60's-70's. His highest blood pressure reading at home was 136/88.  2. Vitamin D deficiency Curtis Hill is currently taking OTC vitamin D 3,000 IU each day. He denies nausea, vomiting or muscle weakness.  3. B12 deficiency Curtis Hill is on OTC B12 5,000 mcg q daily per the patient.   4. Pre-diabetes Curtis Hill is not following his meal plan but he is trying to decrease simple sugars and sugary drinks.  Assessment/Plan:   Orders Placed This Encounter  Procedures   Insulin, random   Hemoglobin A1c   VITAMIN D 25 Hydroxy (Vit-D Deficiency, Fractures)   Vitamin B12    1. Essential hypertension Plan: - BP is at goal today and at home is well controlled. 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   - [place] on pathophysiology of disease and discussed treatment plan, which always includes dietary and lifestyle modification as first line.   - Lifestyle changes such as following our low salt, heart healthy meal plan and engaging in a regular exercise  program discussed   - Avoid buying foods that are: processed, frozen, or prepackaged to avoid excess salt.  - Ambulatory blood pressure monitoring encouraged.  Reminded patient that if they ever feel poorly in any way, to check their blood pressure and pulse as well.  - We will continue to monitor closely alongside PCP/ specialists.  Pt reminded to also f/up with those individuals as instructed by them.   - We will continue to monitor symptoms as they relate to the his weight loss journey.  2. Vitamin D deficiency Low Vitamin D level contributes to fatigue and are associated with obesity, breast, and colon cancer. We will recheck labs before his next office visit. Curtis Hill will follow-up for routine testing of Vitamin D, at least 2-3 times per year to avoid over-replacement.  - VITAMIN D 25 Hydroxy (Vit-D Deficiency, Fractures)  3. B12 deficiency The diagnosis was reviewed with Curtis Hill. Counseling provided today, see below. We will recheck labs before his next office visit, and will continue to monitor. Orders and follow up as documented in patient record.  Counseling The body needs vitamin B12: to make red blood cells; to make DNA; and to help the nerves work properly so they can carry messages from the brain to the body.  The main causes of vitamin B12 deficiency include dietary deficiency, digestive diseases, pernicious anemia, and having a surgery in which part of the stomach or small intestine is removed.  Certain medicines can make it harder  for the body to absorb vitamin B12. These medicines include: heartburn medications; some antibiotics; some medications used to treat diabetes, gout, and high cholesterol.  In some cases, there are no symptoms of this condition. If the condition leads to anemia or nerve damage, various symptoms can occur, such as weakness or fatigue, shortness of breath, and numbness or tingling in your hands and feet.   Treatment:  May include taking vitamin B12  supplements.  Avoid alcohol.  Eat lots of healthy foods that contain vitamin B12: Beef, pork, chicken, [place], and organ meats, such as liver.  Seafood: This includes clams, rainbow trout, salmon, tuna, and haddock. Eggs.  Cereal and dairy products that are fortified: This means that vitamin B12 has been added to the food.   - Vitamin B12  4. Pre-diabetes We will recheck labs before his next office visit. Shonte will continue his prudent nutritional plan and weight loss, and I recommended increasing his activity.    - Insulin, random - Hemoglobin A1c  5. Obesity with current BMI of 62.8 Curtis Hill is currently in the action stage of change. As such, his goal is to continue with weight loss efforts. He has agreed to change to keeping a food journal and adhering to recommended goals of 2000-2200 calories and 120+ grams of protein daily.   I gave Curtis Hill the journaling plan so we can see what food and how many calories he intakes. I asked him to bring in his log of calories and protein per day for his next office visit.  Exercise goals: All adults should avoid inactivity. Some physical activity is better than none, and adults who participate in any amount of physical activity gain some health benefits.  Behavioral modification strategies: increasing lean protein intake, decreasing simple carbohydrates, and keeping healthy foods in the home.  Curtis Hill has agreed to follow-up with our clinic in 3 to 4 weeks. He was informed of the importance of frequent follow-up visits to maximize his success with intensive lifestyle modifications for his multiple health conditions.   Curtis Hill will come in before his next office visit at his convenience for fasting blood work since the labs is closed for the day today.    Objective:   Blood pressure 132/84, pulse (!) 103, temperature 98.1 F (36.7 C), height '5\' 7"'$  (1.702 m), weight (!) 400 lb (181.4 kg), SpO2 97 %. Body mass index is 62.65 kg/m.  General: Cooperative,  alert, well developed, in no acute distress. HEENT: Conjunctivae and lids unremarkable. Cardiovascular: Regular rhythm.  Lungs: Normal work of breathing. Neurologic: No focal deficits.   Lab Results  Component Value Date   CREATININE 0.97 05/23/2020   BUN 22 05/23/2020   NA 141 05/23/2020   K 4.8 05/23/2020   CL 100 05/23/2020   CO2 23 05/23/2020   Lab Results  Component Value Date   ALT 22 05/23/2020   AST 26 05/23/2020   ALKPHOS 65 05/23/2020   BILITOT 0.6 05/23/2020   Lab Results  Component Value Date   HGBA1C 5.6 05/23/2020   HGBA1C  01/30/2007    5.8 (NOTE)   The ADA recommends the following therapeutic goals for glycemic   control related to Hgb A1C measurement:   Goal of Therapy:   < 7.0% Hgb A1C   Action Suggested:  > 8.0% Hgb A1C   Ref:  Diabetes Care, 22, Suppl. 1, 1999   Lab Results  Component Value Date   INSULIN 22.2 05/23/2020   Lab Results  Component Value Date  TSH 0.765 05/23/2020   Lab Results  Component Value Date   CHOL 136 05/23/2020   HDL 67 05/23/2020   LDLCALC 53 05/23/2020   TRIG 85 05/23/2020   CHOLHDL 1.8 11/28/2019   Lab Results  Component Value Date   VD25OH 42.7 05/23/2020   Lab Results  Component Value Date   WBC 8.6 05/23/2020   HGB 12.5 (L) 05/23/2020   HCT 38.8 05/23/2020   MCV 85 05/23/2020   PLT 265 05/23/2020   Lab Results  Component Value Date   IRON 39 (L) 03/15/2007   TIBC 346 03/15/2007   FERRITIN 193 03/15/2007    Obesity Behavioral Intervention:   Approximately 15 minutes were spent on the discussion below.  ASK: We discussed the diagnosis of obesity with Curtis Hill today and Dayshon agreed to give [place] permission to discuss obesity behavioral modification therapy today.  ASSESS: Zamar has the diagnosis of obesity and his BMI today is 62.63. Rocko is in the action stage of change.   ADVISE: Bolden was educated on the multiple health risks of obesity as well as the benefit of weight loss to improve his health. He  was advised of the need for long term treatment and the importance of lifestyle modifications to improve his current health and to decrease his risk of future health problems.  AGREE: Multiple dietary modification options and treatment options were discussed and Juliun agreed to follow the recommendations documented in the above note.  ARRANGE: Abdalrahman was educated on the importance of frequent visits to treat obesity as outlined per CMS and USPSTF guidelines and agreed to schedule his next follow up appointment today.  Attestation Statements:   Reviewed by clinician on day of visit: allergies, medications, problem list, medical history, surgical history, family history, social history, and previous encounter notes.   Wilhemena Durie, am acting as transcriptionist for [employer], DO.  I have reviewed the above documentation for accuracy and completeness, and I agree with the above. Marjory Sneddon, D.O.  The [place] was signed into law in 2016 which includes the topic of electronic health records.  This provides immediate access to information in MyChart.  This includes consultation notes, operative notes, office notes, lab results and pathology reports.  If you have any questions about what you read please let us know at your next visit so we can discuss your concerns and take corrective action if need be.  We are right here with you.

## 2020-12-04 ENCOUNTER — Other Ambulatory Visit: Payer: Self-pay

## 2020-12-04 ENCOUNTER — Encounter (INDEPENDENT_AMBULATORY_CARE_PROVIDER_SITE_OTHER): Payer: Self-pay | Admitting: Family Medicine

## 2020-12-04 ENCOUNTER — Ambulatory Visit (INDEPENDENT_AMBULATORY_CARE_PROVIDER_SITE_OTHER): Payer: Medicare Other | Admitting: Family Medicine

## 2020-12-04 VITALS — BP 125/82 | HR 98 | Temp 98.8°F | Ht 67.0 in | Wt >= 6400 oz

## 2020-12-04 DIAGNOSIS — Z6841 Body Mass Index (BMI) 40.0 and over, adult: Secondary | ICD-10-CM

## 2020-12-04 DIAGNOSIS — E538 Deficiency of other specified B group vitamins: Secondary | ICD-10-CM | POA: Diagnosis not present

## 2020-12-04 DIAGNOSIS — R609 Edema, unspecified: Secondary | ICD-10-CM | POA: Diagnosis not present

## 2020-12-04 DIAGNOSIS — E559 Vitamin D deficiency, unspecified: Secondary | ICD-10-CM | POA: Diagnosis not present

## 2020-12-04 DIAGNOSIS — R600 Localized edema: Secondary | ICD-10-CM

## 2020-12-04 DIAGNOSIS — R7303 Prediabetes: Secondary | ICD-10-CM | POA: Diagnosis not present

## 2020-12-04 NOTE — Progress Notes (Addendum)
Chief Complaint:   OBESITY Curtis Hill is here to discuss his progress with his obesity treatment plan along with follow-up of his obesity related diagnoses. Curtis Hill is on the Category 4 Plan and states he is following his eating plan approximately 100% of the time. Curtis Hill states he is doing 0 minutes 0 times per week.  Today's visit was #: 9 Starting weight: 435 lbs Starting date: 05/23/2020 Today's weight: 412 lbs Today's date: 12/04/2020 Total lbs lost to date: 23 lbs Total lbs lost since last in-office visit: +12  Interim History: Curtis Hill has large fluid fluctuations from visit to visit. He is up 12 lbs today. Our bioimpedance scale does not register his total water weight. He is compliant with his Lasix.  He notes he did not journal because his dog chewed up his journaling paper. He has adhered to the plan well. He denies excessive hunger. He tracks snacks and calories are < than 300 per day. He says he cooks for himself and generally has a salad with 8 oz meat for dinner.  Subjective:   1. Peripheral edema Curtis Hill is compliant with Lasix 40 mg daily. His last echo showed EF of 65-70%. He follows with cardiology regularly-last OV 11/09/20.  He denies chest pain and SOB. He has no diagnosis of CHF.  Assessment/Plan:   1. Peripheral edema Curtis Hill will follow up with cardiology as directed. He will continue all meds including lasix.   2. Obesity with current BMI of 64.51 Curtis Hill is currently in the action stage of change. As such, his goal is to continue with weight loss efforts. He has agreed to keeping a food journal and adhering to recommended goals of 2000-2200 calories and 120 grams of protein daily and 125 carbs per day.  We will draw labs today. They were ordered at last office visit.  Exercise goals: No exercise has been prescribed at this time.  Behavioral modification strategies: planning for success and keeping a strict food journal.  Curtis Hill has agreed to follow-up with our clinic in 3-4  weeks.  Objective:   Blood pressure 125/82, pulse 98, temperature 98.8 F (37.1 C), temperature source Oral, height '5\' 7"'$  (1.702 m), weight (!) 412 lb (186.9 kg), SpO2 97 %. Body mass index is 64.53 kg/m. Curtis Hill uses a walker for ambulation. 1+ Bilateral lower extremities edema.  General: Cooperative, alert, well developed, in no acute distress. Uses walker for ambulation. HEENT: Conjunctivae and lids unremarkable. Cardiovascular: Regular rhythm. 1+ bilateral lower extremity edema. Lungs: Normal work of breathing. Neurologic: No focal deficits.   Lab Results  Component Value Date   CREATININE 0.97 05/23/2020   BUN 22 05/23/2020   NA 141 05/23/2020   K 4.8 05/23/2020   CL 100 05/23/2020   CO2 23 05/23/2020   Lab Results  Component Value Date   ALT 22 05/23/2020   AST 26 05/23/2020   ALKPHOS 65 05/23/2020   BILITOT 0.6 05/23/2020   Lab Results  Component Value Date   HGBA1C 5.6 05/23/2020   HGBA1C  01/30/2007    5.8 (NOTE)   The ADA recommends the following therapeutic goals for glycemic   control related to Hgb A1C measurement:   Goal of Therapy:   < 7.0% Hgb A1C   Action Suggested:  > 8.0% Hgb A1C   Ref:  Diabetes Care, 22, Suppl. 1, 1999   Lab Results  Component Value Date   INSULIN 22.2 05/23/2020   Lab Results  Component Value Date   TSH 0.765 05/23/2020  Lab Results  Component Value Date   CHOL 136 05/23/2020   HDL 67 05/23/2020   LDLCALC 53 05/23/2020   TRIG 85 05/23/2020   CHOLHDL 1.8 11/28/2019   Lab Results  Component Value Date   VD25OH 42.7 05/23/2020   Lab Results  Component Value Date   WBC 8.6 05/23/2020   HGB 12.5 (L) 05/23/2020   HCT 38.8 05/23/2020   MCV 85 05/23/2020   PLT 265 05/23/2020   Lab Results  Component Value Date   IRON 39 (L) 03/15/2007   TIBC 346 03/15/2007   FERRITIN 193 03/15/2007    Obesity Behavioral Intervention:   Approximately 15 minutes were spent on the discussion below.  ASK: We discussed the diagnosis  of obesity with Curtis Hill today and Curtis Hill agreed to give [place] permission to discuss obesity behavioral modification therapy today.  ASSESS: Curtis Hill has the diagnosis of obesity and his BMI today is 64.7. Curtis Hill is in the action stage of change.   ADVISE: Curtis Hill was educated on the multiple health risks of obesity as well as the benefit of weight loss to improve his health. He was advised of the need for long term treatment and the importance of lifestyle modifications to improve his current health and to decrease his risk of future health problems.  AGREE: Multiple dietary modification options and treatment options were discussed and Curtis Hill agreed to follow the recommendations documented in the above note.  ARRANGE: Curtis Hill was educated on the importance of frequent visits to treat obesity as outlined per CMS and USPSTF guidelines and agreed to schedule his next follow up appointment today.  Attestation Statements:   Reviewed by clinician on day of visit: allergies, medications, problem list, medical history, surgical history, family history, social history, and previous encounter notes.  I, Lizbeth Bark, RMA, am acting as Location manager for [employer], [place].   I have reviewed the above documentation for accuracy and completeness, and I agree with the above. -  Georgianne Fick, FNP

## 2020-12-05 LAB — HEMOGLOBIN A1C
Est. average glucose Bld gHb Est-mCnc: 114 mg/dL
Hgb A1c MFr Bld: 5.6 % (ref 4.8–5.6)

## 2020-12-05 LAB — INSULIN, RANDOM: INSULIN: 16.5 u[IU]/mL (ref 2.6–24.9)

## 2020-12-05 LAB — VITAMIN B12: Vitamin B-12: 1333 pg/mL — ABNORMAL HIGH (ref 232–1245)

## 2020-12-05 LAB — VITAMIN D 25 HYDROXY (VIT D DEFICIENCY, FRACTURES): Vit D, 25-Hydroxy: 42.7 ng/mL (ref 30.0–100.0)

## 2020-12-07 DIAGNOSIS — R600 Localized edema: Secondary | ICD-10-CM | POA: Insufficient documentation

## 2020-12-07 DIAGNOSIS — R609 Edema, unspecified: Secondary | ICD-10-CM | POA: Insufficient documentation

## 2020-12-26 ENCOUNTER — Ambulatory Visit (INDEPENDENT_AMBULATORY_CARE_PROVIDER_SITE_OTHER): Payer: Medicare Other | Admitting: Family Medicine

## 2021-01-09 ENCOUNTER — Encounter (INDEPENDENT_AMBULATORY_CARE_PROVIDER_SITE_OTHER): Payer: Self-pay | Admitting: Family Medicine

## 2021-01-09 ENCOUNTER — Other Ambulatory Visit: Payer: Self-pay

## 2021-01-09 ENCOUNTER — Ambulatory Visit (INDEPENDENT_AMBULATORY_CARE_PROVIDER_SITE_OTHER): Payer: Medicare Other | Admitting: Family Medicine

## 2021-01-09 VITALS — BP 141/87 | HR 97 | Temp 98.0°F | Ht 67.0 in | Wt >= 6400 oz

## 2021-01-09 DIAGNOSIS — I10 Essential (primary) hypertension: Secondary | ICD-10-CM | POA: Diagnosis not present

## 2021-01-09 DIAGNOSIS — Z6841 Body Mass Index (BMI) 40.0 and over, adult: Secondary | ICD-10-CM | POA: Diagnosis not present

## 2021-01-10 NOTE — Progress Notes (Signed)
Chief Complaint:   OBESITY Curtis Hill is here to discuss his progress with his obesity treatment plan along with follow-up of his obesity related diagnoses. Curtis Hill is on keeping a food journal and adhering to recommended goals of 2000-2200 calories and 120 grams protein and states he is following his eating plan approximately "close". Curtis Hill states he is not currently exercising.  Today's visit was #: 10 Starting weight: 435 lbs Starting date: 05/23/2020 Today's weight: 423 lbs Today's date: 01/09/2021 Total lbs lost to date: 12 Total lbs lost since last in-office visit: +11  Interim History:  Seen last OV by Dawn on 12/04/20.   Curtis Hill only walks around the house. he is unable to walk outside because he can't get out there very easily.   He brought food journal in- [phone] cal/day and 123-187 grams protein daily.  Per pt, meal plan has too much food and he is unable to eat it all.       Subjective:   1. Essential hypertension Essentially at goal. BP is stable. Medication: Lasix, Diovan  BP Readings from Last 3 Encounters:  01/09/21 (!) 141/87  12/04/20 125/82  11/14/20 132/84   Lab Results  Component Value Date   CREATININE 0.97 05/23/2020   CREATININE 1.00 11/28/2019   CREATININE 0.80 02/27/2019   Assessment/Plan:    1. Essential hypertension Curtis Hill is working on healthy weight loss and exercise to improve blood pressure control. We will watch for signs of hypotension as he continues his lifestyle modifications. Continue current treatment plan.  2. Obesity with current BMI of 66.3  Curtis Hill is currently in the action stage of change. As such, his goal is to continue with weight loss efforts. He has agreed to keeping a food journal and adhering to recommended goals of 2000-2200 calories and 150+ grams protein.   Pt desires to decrease his carb intake and try to focus on increasing proteins to 150 grams or more.  Exercise goals:  As is  Behavioral modification strategies:  increasing lean protein intake, decreasing simple carbohydrates, and planning for success.  Curtis Hill has agreed to follow-up with our clinic in 2-3 weeks. He was informed of the importance of frequent follow-up visits to maximize his success with intensive lifestyle modifications for his multiple health conditions.   Objective:   Blood pressure (!) 141/87, pulse 97, temperature 98 F (36.7 C), height 5\' 7"  (1.702 m), weight (!) 423 lb (191.9 kg), SpO2 98 %. Body mass index is 66.25 kg/m.  General: Cooperative, alert, well developed, in no acute distress. HEENT: Conjunctivae and lids unremarkable. Cardiovascular: Regular rhythm.  Lungs: Normal work of breathing. Neurologic: No focal deficits.   Lab Results  Component Value Date   CREATININE 0.97 05/23/2020   BUN 22 05/23/2020   NA 141 05/23/2020   K 4.8 05/23/2020   CL 100 05/23/2020   CO2 23 05/23/2020   Lab Results  Component Value Date   ALT 22 05/23/2020   AST 26 05/23/2020   ALKPHOS 65 05/23/2020   BILITOT 0.6 05/23/2020   Lab Results  Component Value Date   HGBA1C 5.6 12/04/2020   HGBA1C 5.6 05/23/2020   HGBA1C  01/30/2007    5.8 (NOTE)   The ADA recommends the following therapeutic goals for glycemic   control related to Hgb A1C measurement:   Goal of Therapy:   < 7.0% Hgb A1C   Action Suggested:  > 8.0% Hgb A1C   Ref:  Diabetes Care, 22, Suppl. 1, 1999   Lab  Results  Component Value Date   INSULIN 16.5 12/04/2020   INSULIN 22.2 05/23/2020   Lab Results  Component Value Date   TSH 0.765 05/23/2020   Lab Results  Component Value Date   CHOL 136 05/23/2020   HDL 67 05/23/2020   LDLCALC 53 05/23/2020   TRIG 85 05/23/2020   CHOLHDL 1.8 11/28/2019   Lab Results  Component Value Date   VD25OH 42.7 12/04/2020   VD25OH 42.7 05/23/2020   Lab Results  Component Value Date   WBC 8.6 05/23/2020   HGB 12.5 (L) 05/23/2020   HCT 38.8 05/23/2020   MCV 85 05/23/2020   PLT 265 05/23/2020   Lab Results   Component Value Date   IRON 39 (L) 03/15/2007   TIBC 346 03/15/2007   FERRITIN 193 03/15/2007   Attestation Statements:   Reviewed by clinician on day of visit: allergies, medications, problem list, medical history, surgical history, family history, social history, and previous encounter notes.  Patient was in the office today for 25min.  Over 50% of the time I spent with patient in face to face counseling of various medical conditions, treatment plans of those medical conditions including medicine management and lifestyle modification, strategies to improve health and well being; and in coordination of care.  SEE TREATMENT PLAN FOR DETAILS.  Coral Ceo, CMA, am acting as transcriptionist for [employer], DO.  The [place] was signed into law in 2016 which includes the topic of electronic health records.  This provides immediate access to information in MyChart.  This includes consultation notes, operative notes, office notes, lab results and pathology reports.  If you have any questions about what you read please let us know at your next visit so we can discuss your concerns and take corrective action if need be.  We are right here with you.

## 2021-01-13 DIAGNOSIS — Z23 Encounter for immunization: Secondary | ICD-10-CM | POA: Diagnosis not present

## 2021-01-13 DIAGNOSIS — I10 Essential (primary) hypertension: Secondary | ICD-10-CM | POA: Diagnosis not present

## 2021-01-13 DIAGNOSIS — E785 Hyperlipidemia, unspecified: Secondary | ICD-10-CM | POA: Diagnosis not present

## 2021-01-13 DIAGNOSIS — Z0000 Encounter for general adult medical examination without abnormal findings: Secondary | ICD-10-CM | POA: Diagnosis not present

## 2021-01-13 DIAGNOSIS — J4520 Mild intermittent asthma, uncomplicated: Secondary | ICD-10-CM | POA: Diagnosis not present

## 2021-01-13 DIAGNOSIS — I700 Atherosclerosis of aorta: Secondary | ICD-10-CM | POA: Diagnosis not present

## 2021-01-13 DIAGNOSIS — M109 Gout, unspecified: Secondary | ICD-10-CM | POA: Diagnosis not present

## 2021-01-13 DIAGNOSIS — Z862 Personal history of diseases of the blood and blood-forming organs and certain disorders involving the immune mechanism: Secondary | ICD-10-CM | POA: Diagnosis not present

## 2021-01-13 DIAGNOSIS — Z125 Encounter for screening for malignant neoplasm of prostate: Secondary | ICD-10-CM | POA: Diagnosis not present

## 2021-01-13 DIAGNOSIS — B372 Candidiasis of skin and nail: Secondary | ICD-10-CM | POA: Diagnosis not present

## 2021-01-13 DIAGNOSIS — M549 Dorsalgia, unspecified: Secondary | ICD-10-CM | POA: Diagnosis not present

## 2021-01-13 LAB — CBC AND DIFFERENTIAL
HCT: 38 — AB (ref 41–53)
Hemoglobin: 12.5 — AB (ref 13.5–17.5)
WBC: 8

## 2021-01-13 LAB — HEPATIC FUNCTION PANEL
ALT: 21 (ref 10–40)
AST: 19 (ref 14–40)
Alkaline Phosphatase: 66 (ref 25–125)
Bilirubin, Total: 0.6

## 2021-01-13 LAB — COMPREHENSIVE METABOLIC PANEL
Albumin: 4.6 (ref 3.5–5.0)
Calcium: 10.1 (ref 8.7–10.7)
GFR calc non Af Amer: 76

## 2021-01-13 LAB — BASIC METABOLIC PANEL
BUN: 30 — AB (ref 4–21)
CO2: 30 — AB (ref 13–22)
Chloride: 104 (ref 99–108)
Creatinine: 1 (ref <=1.3)
Glucose: 95
Potassium: 5.2 (ref 3.4–5.3)
Sodium: 141 (ref 137–147)

## 2021-01-13 LAB — LIPID PANEL
Cholesterol: 122 (ref 0–200)
HDL: 66 (ref 35–70)
LDL Cholesterol: 42
Triglycerides: 63 (ref 40–160)

## 2021-01-13 LAB — CBC: RBC: 4.51 (ref 3.87–5.11)

## 2021-01-31 DIAGNOSIS — Z08 Encounter for follow-up examination after completed treatment for malignant neoplasm: Secondary | ICD-10-CM | POA: Diagnosis not present

## 2021-01-31 DIAGNOSIS — D229 Melanocytic nevi, unspecified: Secondary | ICD-10-CM | POA: Diagnosis not present

## 2021-01-31 DIAGNOSIS — Z85820 Personal history of malignant melanoma of skin: Secondary | ICD-10-CM | POA: Diagnosis not present

## 2021-01-31 DIAGNOSIS — C44222 Squamous cell carcinoma of skin of right ear and external auricular canal: Secondary | ICD-10-CM | POA: Diagnosis not present

## 2021-01-31 DIAGNOSIS — D485 Neoplasm of uncertain behavior of skin: Secondary | ICD-10-CM | POA: Diagnosis not present

## 2021-01-31 DIAGNOSIS — R229 Localized swelling, mass and lump, unspecified: Secondary | ICD-10-CM | POA: Diagnosis not present

## 2021-01-31 DIAGNOSIS — C434 Malignant melanoma of scalp and neck: Secondary | ICD-10-CM | POA: Diagnosis not present

## 2021-01-31 DIAGNOSIS — L304 Erythema intertrigo: Secondary | ICD-10-CM | POA: Diagnosis not present

## 2021-02-03 ENCOUNTER — Ambulatory Visit (INDEPENDENT_AMBULATORY_CARE_PROVIDER_SITE_OTHER): Payer: Medicare Other | Admitting: Family Medicine

## 2021-02-06 ENCOUNTER — Encounter (INDEPENDENT_AMBULATORY_CARE_PROVIDER_SITE_OTHER): Payer: Self-pay | Admitting: Family Medicine

## 2021-02-06 ENCOUNTER — Other Ambulatory Visit: Payer: Self-pay

## 2021-02-06 ENCOUNTER — Ambulatory Visit (INDEPENDENT_AMBULATORY_CARE_PROVIDER_SITE_OTHER): Payer: Medicare Other | Admitting: Family Medicine

## 2021-02-06 ENCOUNTER — Encounter (INDEPENDENT_AMBULATORY_CARE_PROVIDER_SITE_OTHER): Payer: Self-pay

## 2021-02-06 VITALS — BP 142/83 | HR 89 | Temp 97.9°F | Ht 67.0 in | Wt >= 6400 oz

## 2021-02-06 DIAGNOSIS — Z6841 Body Mass Index (BMI) 40.0 and over, adult: Secondary | ICD-10-CM

## 2021-02-06 DIAGNOSIS — I10 Essential (primary) hypertension: Secondary | ICD-10-CM

## 2021-02-06 DIAGNOSIS — E7849 Other hyperlipidemia: Secondary | ICD-10-CM

## 2021-02-10 NOTE — Progress Notes (Signed)
Chief Complaint:   OBESITY Curtis Hill is here to discuss his progress with his obesity treatment plan along with follow-up of his obesity related diagnoses. Curtis Hill is on keeping a food journal and adhering to recommended goals of 2000-2200 calories and 150+ grams protein and states he is following his eating plan approximately 100% of the time. [name] states he is not currently exercising.  Today's visit was #: 11 Starting weight: 435 lbs Starting date: 05/23/2020 Today's weight: 423 lbs Today's date: 02/06/2021 Total lbs lost to date: 12 Total lbs lost since last in-office visit: 0  Interim History: Protein and calorie intake over the past 3 days: 1394 & 121 gr, 1730 & 171 gr, and 2155 & 144 gr. Curtis Hill says he can't hit his calorie and has journaled everyday. He bought a rollator so he can walk outside. Pt recently had labs done with PCP on 01/13/2021, which were essentially within normal limits.  Subjective:   1. Essential hypertension BP high normal range today but controlled at home. BP at home runs 110-120's/70-80's. Curtis Hill is asymptomatic and he has no concerns.  2. Other hyperlipidemia Discussed labs with patient today. At goal. Curtis Hill's LDL [place] 42, HDL 66, and triglycerides 63.  Assessment/Plan:  No orders of the defined types were placed in this encounter.   There are no discontinued medications.   No orders of the defined types were placed in this encounter.    1. Essential hypertension [name] is working on healthy weight loss and exercise to improve blood pressure control. We will watch for signs of hypotension as he continues his lifestyle modifications. Continue prudent nutritional plan and decrease salt intake and weight loss.  2. Other hyperlipidemia Cardiovascular risk and specific lipid/LDL goals reviewed.  We discussed several lifestyle modifications today and [name] will continue to work on diet, exercise and weight loss efforts. Orders and follow up as documented in patient  record. Continue meds per PCP, prudent nutritional plan, and weight loss.  Counseling Intensive lifestyle modifications are the first line treatment for this issue. Dietary changes: Increase soluble fiber. Decrease simple carbohydrates. Exercise changes: Moderate to vigorous-intensity aerobic activity 150 minutes per week if tolerated. Lipid-lowering medications: see documented in medical record.  3. Obesity with current BMI of 66.3  Curtis Hill is currently in the action stage of change. As such, his goal is to continue with weight loss efforts. He has agreed to keeping a food journal and adhering to recommended goals of 2000-2200 calories and 150+ grams protein.   Bring log to next OV for review of cal/protein intake daily.  Exercise goals:  As is  Behavioral modification strategies: planning for success and keeping a strict food journal.  Curtis Hill has agreed to follow-up with our clinic in 2-3 weeks. He was informed of the importance of frequent follow-up visits to maximize his success with intensive lifestyle modifications for his multiple health conditions.   Objective:   Blood pressure (!) 142/83, pulse 89, temperature 97.9 F (36.6 C), height 5\' 7"  (1.702 m), weight (!) 423 lb (191.9 kg), SpO2 98 %. Body mass index is 66.25 kg/m.  General: Cooperative, alert, well developed, in no acute distress. HEENT: Conjunctivae and lids unremarkable. Cardiovascular: Regular rhythm.  Lungs: Normal work of breathing. Neurologic: No focal deficits.   Lab Results  Component Value Date   CREATININE 1.0 01/13/2021   BUN 30 (A) 01/13/2021   NA 141 01/13/2021   K 5.2 01/13/2021   CL 104 01/13/2021   CO2 30 (A) 01/13/2021  Lab Results  Component Value Date   ALT 21 01/13/2021   AST 19 01/13/2021   ALKPHOS 66 01/13/2021   BILITOT 0.6 05/23/2020   Lab Results  Component Value Date   HGBA1C 5.6 12/04/2020   HGBA1C 5.6 05/23/2020   HGBA1C  01/30/2007    5.8 (NOTE)   The ADA recommends the  following therapeutic goals for glycemic   control related to Hgb A1C measurement:   Goal of Therapy:   < 7.0% Hgb A1C   Action Suggested:  > 8.0% Hgb A1C   Ref:  Diabetes Care, 22, Suppl. 1, 1999   Lab Results  Component Value Date   INSULIN 16.5 12/04/2020   INSULIN 22.2 05/23/2020   Lab Results  Component Value Date   TSH 0.765 05/23/2020   Lab Results  Component Value Date   CHOL 122 01/13/2021   HDL 66 01/13/2021   LDLCALC 42 01/13/2021   TRIG 63 01/13/2021   CHOLHDL 1.8 11/28/2019   Lab Results  Component Value Date   VD25OH 42.7 12/04/2020   VD25OH 42.7 05/23/2020   Lab Results  Component Value Date   WBC 8.0 01/13/2021   HGB 12.5 (A) 01/13/2021   HCT 38 (A) 01/13/2021   MCV 85 05/23/2020   PLT 265 05/23/2020   Lab Results  Component Value Date   IRON 39 (L) 03/15/2007   TIBC 346 03/15/2007   FERRITIN 193 03/15/2007    Obesity Behavioral Intervention:   Approximately 15 minutes were spent on the discussion below.  ASK: We discussed the diagnosis of obesity with Curtis Hill today and [name] agreed to give [place] permission to discuss obesity behavioral modification therapy today.  ASSESS: Curtis Hill has the diagnosis of obesity and his BMI today is 66.3. Curtis Hill is in the action stage of change.   ADVISE: Curtis Hill was educated on the multiple health risks of obesity as well as the benefit of weight loss to improve his health. He was advised of the need for long term treatment and the importance of lifestyle modifications to improve his current health and to decrease his risk of future health problems.  AGREE: Multiple dietary modification options and treatment options were discussed and Curtis Hill agreed to follow the recommendations documented in the above note.  ARRANGE: Curtis Hill was educated on the importance of frequent visits to treat obesity as outlined per CMS and USPSTF guidelines and agreed to schedule his next follow up appointment today.  Attestation Statements:   Reviewed by  clinician on day of visit: allergies, medications, problem list, medical history, surgical history, family history, social history, and previous encounter notes.  Coral Ceo, CMA, am acting as transcriptionist for [employer], DO.  I have reviewed the above documentation for accuracy and completeness, and I agree with the above. Marjory Sneddon, D.O.  The [place] was signed into law in 2016 which includes the topic of electronic health records.  This provides immediate access to information in MyChart.  This includes consultation notes, operative notes, office notes, lab results and pathology reports.  If you have any questions about what you read please let us know at your next visit so we can discuss your concerns and take corrective action if need be.  We are right here with you.

## 2021-02-25 ENCOUNTER — Ambulatory Visit (INDEPENDENT_AMBULATORY_CARE_PROVIDER_SITE_OTHER): Payer: Medicare Other | Admitting: Family Medicine

## 2021-02-25 ENCOUNTER — Encounter (INDEPENDENT_AMBULATORY_CARE_PROVIDER_SITE_OTHER): Payer: Self-pay

## 2021-03-28 DIAGNOSIS — J4520 Mild intermittent asthma, uncomplicated: Secondary | ICD-10-CM | POA: Diagnosis not present

## 2021-03-28 DIAGNOSIS — K219 Gastro-esophageal reflux disease without esophagitis: Secondary | ICD-10-CM | POA: Diagnosis not present

## 2021-03-28 DIAGNOSIS — E785 Hyperlipidemia, unspecified: Secondary | ICD-10-CM | POA: Diagnosis not present

## 2021-03-28 DIAGNOSIS — M1990 Unspecified osteoarthritis, unspecified site: Secondary | ICD-10-CM | POA: Diagnosis not present

## 2021-03-28 DIAGNOSIS — I10 Essential (primary) hypertension: Secondary | ICD-10-CM | POA: Diagnosis not present

## 2021-04-09 ENCOUNTER — Other Ambulatory Visit: Payer: Self-pay | Admitting: Cardiothoracic Surgery

## 2021-04-09 DIAGNOSIS — I712 Thoracic aortic aneurysm, without rupture, unspecified: Secondary | ICD-10-CM

## 2021-04-29 ENCOUNTER — Encounter (HOSPITAL_BASED_OUTPATIENT_CLINIC_OR_DEPARTMENT_OTHER): Payer: Self-pay | Admitting: Emergency Medicine

## 2021-04-29 ENCOUNTER — Other Ambulatory Visit: Payer: Self-pay

## 2021-04-29 ENCOUNTER — Emergency Department (HOSPITAL_BASED_OUTPATIENT_CLINIC_OR_DEPARTMENT_OTHER)
Admission: EM | Admit: 2021-04-29 | Discharge: 2021-04-29 | Disposition: A | Discharge status: Home / Self Care | Payer: Medicare Other | Attending: Emergency Medicine | Admitting: Emergency Medicine

## 2021-04-29 DIAGNOSIS — Z7982 Long term (current) use of aspirin: Secondary | ICD-10-CM | POA: Diagnosis not present

## 2021-04-29 DIAGNOSIS — I10 Essential (primary) hypertension: Secondary | ICD-10-CM | POA: Diagnosis not present

## 2021-04-29 DIAGNOSIS — I2510 Atherosclerotic heart disease of native coronary artery without angina pectoris: Secondary | ICD-10-CM | POA: Diagnosis not present

## 2021-04-29 DIAGNOSIS — Z8546 Personal history of malignant neoplasm of prostate: Secondary | ICD-10-CM | POA: Diagnosis not present

## 2021-04-29 LAB — COMPREHENSIVE METABOLIC PANEL
ALT: 23 U/L (ref 0–44)
AST: 24 U/L (ref 15–41)
Albumin: 4.7 g/dL (ref 3.5–5.0)
Alkaline Phosphatase: 63 U/L (ref 38–126)
Anion gap: 12 (ref 5–15)
BUN: 22 mg/dL (ref 8–23)
CO2: 27 mmol/L (ref 22–32)
Calcium: 10.2 mg/dL (ref 8.9–10.3)
Chloride: 98 mmol/L (ref 98–111)
Creatinine, Ser: 0.94 mg/dL (ref 0.61–1.24)
GFR, Estimated: >60 mL/min (ref >60)
Glucose, Bld: 93 mg/dL (ref 70–99)
Potassium: 4.2 mmol/L (ref 3.5–5.1)
Sodium: 137 mmol/L (ref 135–145)
Total Bilirubin: 0.8 mg/dL (ref 0.3–1.2)
Total Protein: 8.4 g/dL — ABNORMAL HIGH (ref 6.5–8.1)

## 2021-04-29 LAB — CBC WITH DIFFERENTIAL/PLATELET
Abs Immature Granulocytes: 0.03 10*3/uL (ref 0.00–0.07)
Basophils Absolute: 0.1 10*3/uL (ref 0.0–0.1)
Basophils Relative: 1 %
Eosinophils Absolute: 0.1 10*3/uL (ref 0.0–0.5)
Eosinophils Relative: 1 %
HCT: 40.7 % (ref 39.0–52.0)
Hemoglobin: 12.8 g/dL — ABNORMAL LOW (ref 13.0–17.0)
Immature Granulocytes: 0 %
Lymphocytes Relative: 14 %
Lymphs Abs: 1.3 10*3/uL (ref 0.7–4.0)
MCH: 27 pg (ref 26.0–34.0)
MCHC: 31.4 g/dL (ref 30.0–36.0)
MCV: 85.9 fL (ref 80.0–100.0)
Monocytes Absolute: 0.5 10*3/uL (ref 0.1–1.0)
Monocytes Relative: 6 %
Neutro Abs: 7.3 10*3/uL (ref 1.7–7.7)
Neutrophils Relative %: 78 %
Platelets: 259 10*3/uL (ref 150–400)
RBC: 4.74 MIL/uL (ref 4.22–5.81)
RDW: 14.8 % (ref 11.5–15.5)
WBC: 9.3 10*3/uL (ref 4.0–10.5)
nRBC: 0 % (ref 0.0–0.2)

## 2021-04-29 LAB — TROPONIN I (HIGH SENSITIVITY): Troponin I (High Sensitivity): 12 ng/L (ref <18)

## 2021-04-29 NOTE — Discharge Instructions (Signed)
Your labs were unremarkable here in the ED. Please schedule and ED follow up visit with your primary care doctor and have your blood pressure rechecked. They may want to make changes to your medications.

## 2021-04-29 NOTE — ED Triage Notes (Signed)
Pt arrives to ED with c/o elevated blood pressure. Pt reports that he has had elevated readings around 747 systolic since last night. He reports that his head feels "stuffy."

## 2021-04-29 NOTE — ED Notes (Signed)
Pt request to be taken to bathroom via WC

## 2021-04-29 NOTE — ED Provider Notes (Signed)
[facility] EMERGENCY DEPT Provider Note   CSN: [id number] Arrival date & time: 04/29/21  1136     History  Chief Complaint  Patient presents with   Hypertension    Curtis Hill is a 76 y.o. male.  Past medical history includes obesity, hypertension, hyperlipidemia, coronary artery disease, prostate cancer, pulmonary hypertension, aortic stenosis, and OSA.  Patient presents the emergency department today after having high blood pressure after reading at home.  Patient states that he is currently taking valsartan at 320 mg daily.  He takes this regularly and most recently took it this morning.  He states that over the past 2 days he has noticed gradually elevated blood pressure.  He normally checks his blood pressure daily and has readings with systolics between 536 and 120.  This morning he checks his blood pressure and noticed that the systolic was well above 644 and diastolic above 034.  He checked this multiple times and had the same readings.  At the time he felt some sort of brain fogginess.  He denies headache, chest pain, shortness of breath, abdominal pain, nausea, vomiting, vision changes, numbness, weakness, speech changes.  He is now saying that the brain fogginess is resolved.  He is completely asymptomatic at this time.   Hypertension      Home Medications Prior to Admission medications   Medication Sig Start Date End Date Taking? Authorizing Provider  allopurinol (ZYLOPRIM) 300 MG tablet Take 300 mg by mouth daily.    [provider]  aspirin EC 81 MG tablet Take 81 mg by mouth daily. Swallow whole.    [provider]  atorvastatin (LIPITOR) 20 MG tablet Take 20 mg by mouth every evening.    [provider]  Biotin 7500 MCG TABS Take 1 tablet by mouth daily.    [provider]  Calcium Carbonate (CALTRATE 600 PO) Take 2 each by mouth in the morning and at bedtime.    [provider]  cholecalciferol (VITAMIN D3)  25 MCG (1000 UNIT) tablet Take 3,000 Units by mouth daily. Take 3 tabs (3000 units total) by mouth once daily.    [provider]  Coenzyme Q10 (COQ10) 200 MG CAPS Take 1 capsule by mouth in the morning.    [provider]  Cyanocobalamin (VITAMIN B-12) 5000 MCG TBDP Take 5,000 mcg by mouth daily with lunch.     [provider]  furosemide (LASIX) 40 MG tablet Take 40 mg by mouth daily.    [provider]  MAGNESIUM PO Take 380 mg by mouth every evening. Take 2 tabs (760mg  total) by mouth every evening.    [provider]  Multiple Vitamins-Minerals (CENTRUM SILVER 50+MEN) TABS Take 1 tablet by mouth in the morning.    [provider]  traMADol (ULTRAM) 50 MG tablet Take 50 mg by mouth 2 (two) times daily as needed.    [provider]  UNABLE TO FIND Take 3 capsules by mouth daily. Med Name: Balance of [employer]ra [employer] Branded Fruit capsule.    [provider]  UNABLE TO FIND Take 3 each by mouth daily. Med Name: Balance of [employer]ra [employer] Branded Vegetable Capsules.    [provider]  valsartan (DIOVAN) 320 MG tablet Take 320 mg by mouth daily.    [provider]      Allergies    Levofloxacin and Lisinopril    Review of Systems   Review of Systems  Cardiovascular:        High  blood pressure  All other systems reviewed and are negative.  Physical Exam Updated Vital Signs BP (!) 164/96    Pulse 63    Temp 98.1 F (36.7 C)    Resp 18    Ht 5\' 7"  (1.702 m)    Wt (!) 195 kg    SpO2 100%    BMI 67.35 kg/m  Physical Exam Vitals and nursing note reviewed.  Constitutional:      General: He is not in acute distress.    Appearance: Normal appearance. He is obese. He is not ill-appearing, toxic-appearing or diaphoretic.  HENT:     Head: Normocephalic and atraumatic.     Nose: No nasal deformity.     Mouth/Throat:     Lips: Pink. No lesions.     Mouth: No injury, lacerations, oral lesions or angioedema.     Pharynx:  Uvula midline. No pharyngeal swelling or uvula swelling.  Eyes:     General: Gaze aligned appropriately. No scleral icterus.       Right eye: No discharge.        Left eye: No discharge.     Extraocular Movements: Extraocular movements intact.     Conjunctiva/sclera: Conjunctivae normal.     Right eye: Right conjunctiva is not injected. No exudate or hemorrhage.    Left eye: Left conjunctiva is not injected. No exudate or hemorrhage.    Pupils: Pupils are equal, round, and reactive to light.  Cardiovascular:     Rate and Rhythm: Normal rate and regular rhythm.     Pulses: Normal pulses.          Radial pulses are 2+ on the right side and 2+ on the left side.       Dorsalis pedis pulses are 2+ on the right side and 2+ on the left side.     Heart sounds: Normal heart sounds, S1 normal and S2 normal. Heart sounds not distant. No murmur heard.   No friction rub. No gallop. No S3 or S4 sounds.  Pulmonary:     Effort: Pulmonary effort is normal. No accessory muscle usage or respiratory distress.     Breath sounds: Normal breath sounds. No stridor. No wheezing, rhonchi or rales.  Chest:     Chest wall: No tenderness.  Abdominal:     General: Abdomen is flat. Bowel sounds are normal. There is no distension.     Palpations: Abdomen is soft. There is no mass or pulsatile mass.     Tenderness: There is no abdominal tenderness. There is no guarding or rebound.  Musculoskeletal:     Right lower leg: No edema.     Left lower leg: No edema.  Skin:    General: Skin is warm and dry.     Coloration: Skin is not jaundiced or pale.     Findings: No bruising, erythema, lesion or rash.  Neurological:     General: No focal deficit present.     Mental Status: He is alert and oriented to person, place, and time.     GCS: GCS eye subscore is 4. GCS verbal subscore is 5. GCS motor subscore is 6.     Comments: Alert and Oriented x 3 Speech clear with no aphasia Cranial Nerve testing - PERRLA. EOM intact.  No Nystagmus - No facial asymmetry Motor: - 5/5 motor strength in all four extremities.  Sensation: - Grossly intact in all four extremities.  Coordination:  - Gait without abnormality.   Psychiatric:  Mood and Affect: Mood normal.        Behavior: Behavior normal. Behavior is cooperative.    ED Results / Procedures / Treatments   Labs (all labs ordered are listed, but only abnormal results are displayed) Labs Reviewed  CBC WITH DIFFERENTIAL/PLATELET - Abnormal; Notable for the following components:      Result Value   Hemoglobin 12.8 (*)    All other components within normal limits  COMPREHENSIVE METABOLIC PANEL - Abnormal; Notable for the following components:   Total Protein 8.4 (*)    All other components within normal limits  TROPONIN I (HIGH SENSITIVITY)  TROPONIN I (HIGH SENSITIVITY)    EKG EKG Interpretation  Date/Time:  Tuesday April 29 2021 11:58:03 EST Ventricular Rate:  90 PR Interval:  232 QRS Duration: 146 QT Interval:  404 QTC Calculation: 494 R Axis:   -76 Text Interpretation: Sinus rhythm with 1st degree A-V block Right bundle branch block Left anterior fascicular block When compared with ECG of 25-Oct-2017 12:42, PR interval has increased Confirmed by Regan Lemming ([id number]) on 04/29/2021 4:18:54 PM  Radiology No results found.  Procedures Procedures    Medications Ordered in ED Medications - No data to display  ED Course/ Medical Decision Making/ A&P                           Medical Decision Making Amount and/or Complexity of Data Reviewed External Data Reviewed: labs.    Details: hgb Labs: ordered. Decision-making details documented in ED Course. ECG/medicine tests: ordered and independent interpretation performed. Decision-making details documented in ED Course.   This is a 76 y.o. male with a PMH of obesity, hypertension, hyperlipidemia, coronary artery disease, prostate cancer, pulmonary hypertension, aortic stenosis, and OSA  who presents to the ED with elevated BP readings.  Vitals are stable.  Exam is reassuring.  Is neurologically intact.  He has has having no chest pain or other concerning endorgan damage symptoms. Blood pressure is 164/96 here in the ED.  I personally reviewed all laboratory work and imaging. Abnormal results outlined below.  CBC with stable anemia with hgb 12.8. CMP reassuring. Troponin negative. EKG revealing 1st degree AV block which was present prior.   Work-up reveals no signs of endorgan damage.  Patient is asymptomatic at this time.  Blood pressure is controlled.  Dispo Plan: F/u with PCP regarding further BP management.  I have seen and evaluated this patient in conjunction with my attending physician who agrees and has made changes to the plan accordingly.  Portions of this note were generated with [employer]. Dictation errors may occur despite best attempts at proofreading.   Final Clinical Impression(s) / ED Diagnoses Final diagnoses:  Hypertension, unspecified type    Rx / DC Orders ED Discharge Orders     None         Adolphus Birchwood, PA-C 04/29/21 2158    Malvin Johns, MD 05/03/21 1501

## 2021-05-01 DIAGNOSIS — D0421 Carcinoma in situ of skin of right ear and external auricular canal: Secondary | ICD-10-CM | POA: Diagnosis not present

## 2021-05-15 DIAGNOSIS — L304 Erythema intertrigo: Secondary | ICD-10-CM | POA: Diagnosis not present

## 2021-05-26 ENCOUNTER — Other Ambulatory Visit: Payer: Self-pay

## 2021-05-26 ENCOUNTER — Ambulatory Visit: Payer: Self-pay | Admitting: Cardiothoracic Surgery

## 2021-06-02 ENCOUNTER — Other Ambulatory Visit: Payer: Self-pay

## 2021-06-02 ENCOUNTER — Encounter: Payer: Self-pay | Admitting: Cardiothoracic Surgery

## 2021-06-02 ENCOUNTER — Ambulatory Visit (INDEPENDENT_AMBULATORY_CARE_PROVIDER_SITE_OTHER): Payer: Medicare Other | Admitting: Cardiothoracic Surgery

## 2021-06-02 ENCOUNTER — Ambulatory Visit
Admission: RE | Admit: 2021-06-02 | Discharge: 2021-06-02 | Disposition: A | Discharge status: Home / Self Care | Payer: Medicare Other | Source: Ambulatory Visit | Attending: Cardiothoracic Surgery | Admitting: Cardiothoracic Surgery

## 2021-06-02 ENCOUNTER — Telehealth: Payer: Self-pay

## 2021-06-02 VITALS — BP 153/92 | HR 92 | Resp 20 | Ht 67.0 in | Wt >= 6400 oz

## 2021-06-02 DIAGNOSIS — J45909 Unspecified asthma, uncomplicated: Secondary | ICD-10-CM | POA: Diagnosis not present

## 2021-06-02 DIAGNOSIS — I712 Thoracic aortic aneurysm, without rupture, unspecified: Secondary | ICD-10-CM

## 2021-06-02 DIAGNOSIS — M314 Aortic arch syndrome [Takayasu]: Secondary | ICD-10-CM | POA: Diagnosis not present

## 2021-06-02 DIAGNOSIS — I7121 Aneurysm of the ascending aorta, without rupture: Secondary | ICD-10-CM | POA: Diagnosis not present

## 2021-06-02 DIAGNOSIS — I2510 Atherosclerotic heart disease of native coronary artery without angina pectoris: Secondary | ICD-10-CM | POA: Diagnosis not present

## 2021-06-02 DIAGNOSIS — K449 Diaphragmatic hernia without obstruction or gangrene: Secondary | ICD-10-CM | POA: Diagnosis not present

## 2021-06-02 DIAGNOSIS — Z8546 Personal history of malignant neoplasm of prostate: Secondary | ICD-10-CM | POA: Diagnosis not present

## 2021-06-02 MED ORDER — IOPAMIDOL (ISOVUE-370) INJECTION 76%
75.0000 mL | Freq: Once | INTRAVENOUS | Status: AC | PRN
  Administered 2021-06-02: 75 mL via INTRAVENOUS
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
## 2021-06-02 NOTE — Telephone Encounter (Signed)
Call report given per [facility] for patient's recent Ct angio. Patient is to see Dr Prescott Gum in clinic today to go over results.

## 2021-06-18 DIAGNOSIS — Z20822 Contact with and (suspected) exposure to covid-19: Secondary | ICD-10-CM | POA: Diagnosis not present

## 2021-07-14 DIAGNOSIS — H18523 Epithelial (juvenile) corneal dystrophy, bilateral: Secondary | ICD-10-CM | POA: Diagnosis not present

## 2021-07-14 DIAGNOSIS — Z20822 Contact with and (suspected) exposure to covid-19: Secondary | ICD-10-CM | POA: Diagnosis not present

## 2021-07-16 DIAGNOSIS — M549 Dorsalgia, unspecified: Secondary | ICD-10-CM | POA: Diagnosis not present

## 2021-07-16 DIAGNOSIS — R21 Rash and other nonspecific skin eruption: Secondary | ICD-10-CM | POA: Diagnosis not present

## 2021-07-16 DIAGNOSIS — E65 Localized adiposity: Secondary | ICD-10-CM | POA: Diagnosis not present

## 2021-07-16 DIAGNOSIS — R7301 Impaired fasting glucose: Secondary | ICD-10-CM | POA: Diagnosis not present

## 2021-07-16 DIAGNOSIS — B372 Candidiasis of skin and nail: Secondary | ICD-10-CM | POA: Diagnosis not present

## 2021-07-28 DIAGNOSIS — Z20822 Contact with and (suspected) exposure to covid-19: Secondary | ICD-10-CM | POA: Diagnosis not present

## 2021-08-16 DIAGNOSIS — Z20822 Contact with and (suspected) exposure to covid-19: Secondary | ICD-10-CM | POA: Diagnosis not present

## 2021-11-12 DIAGNOSIS — E785 Hyperlipidemia, unspecified: Secondary | ICD-10-CM | POA: Diagnosis not present

## 2021-11-12 DIAGNOSIS — K219 Gastro-esophageal reflux disease without esophagitis: Secondary | ICD-10-CM | POA: Diagnosis not present

## 2021-11-12 DIAGNOSIS — I10 Essential (primary) hypertension: Secondary | ICD-10-CM | POA: Diagnosis not present

## 2021-11-12 DIAGNOSIS — J4520 Mild intermittent asthma, uncomplicated: Secondary | ICD-10-CM | POA: Diagnosis not present

## 2021-11-19 ENCOUNTER — Encounter (INDEPENDENT_AMBULATORY_CARE_PROVIDER_SITE_OTHER): Payer: Self-pay

## 2022-01-16 DIAGNOSIS — C61 Malignant neoplasm of prostate: Secondary | ICD-10-CM | POA: Diagnosis not present

## 2022-01-16 DIAGNOSIS — Z23 Encounter for immunization: Secondary | ICD-10-CM | POA: Diagnosis not present

## 2022-01-16 DIAGNOSIS — R7303 Prediabetes: Secondary | ICD-10-CM | POA: Diagnosis not present

## 2022-01-16 DIAGNOSIS — J4520 Mild intermittent asthma, uncomplicated: Secondary | ICD-10-CM | POA: Diagnosis not present

## 2022-01-16 DIAGNOSIS — E785 Hyperlipidemia, unspecified: Secondary | ICD-10-CM | POA: Diagnosis not present

## 2022-01-16 DIAGNOSIS — I10 Essential (primary) hypertension: Secondary | ICD-10-CM | POA: Diagnosis not present

## 2022-01-16 DIAGNOSIS — Z0000 Encounter for general adult medical examination without abnormal findings: Secondary | ICD-10-CM | POA: Diagnosis not present

## 2022-01-16 DIAGNOSIS — Z862 Personal history of diseases of the blood and blood-forming organs and certain disorders involving the immune mechanism: Secondary | ICD-10-CM | POA: Diagnosis not present

## 2022-01-16 DIAGNOSIS — Z125 Encounter for screening for malignant neoplasm of prostate: Secondary | ICD-10-CM | POA: Diagnosis not present

## 2022-01-16 DIAGNOSIS — I712 Thoracic aortic aneurysm, without rupture, unspecified: Secondary | ICD-10-CM | POA: Diagnosis not present

## 2022-01-16 DIAGNOSIS — R7301 Impaired fasting glucose: Secondary | ICD-10-CM | POA: Diagnosis not present

## 2022-01-16 DIAGNOSIS — I700 Atherosclerosis of aorta: Secondary | ICD-10-CM | POA: Diagnosis not present

## 2022-01-16 DIAGNOSIS — M109 Gout, unspecified: Secondary | ICD-10-CM | POA: Diagnosis not present

## 2022-02-03 DIAGNOSIS — L570 Actinic keratosis: Secondary | ICD-10-CM | POA: Diagnosis not present

## 2022-02-03 DIAGNOSIS — Z86007 Personal history of in-situ neoplasm of skin: Secondary | ICD-10-CM | POA: Diagnosis not present

## 2022-02-03 DIAGNOSIS — L821 Other seborrheic keratosis: Secondary | ICD-10-CM | POA: Diagnosis not present

## 2022-02-03 DIAGNOSIS — Z08 Encounter for follow-up examination after completed treatment for malignant neoplasm: Secondary | ICD-10-CM | POA: Diagnosis not present

## 2022-02-03 DIAGNOSIS — L578 Other skin changes due to chronic exposure to nonionizing radiation: Secondary | ICD-10-CM | POA: Diagnosis not present

## 2022-02-03 DIAGNOSIS — D225 Melanocytic nevi of trunk: Secondary | ICD-10-CM | POA: Diagnosis not present

## 2022-02-03 DIAGNOSIS — D0421 Carcinoma in situ of skin of right ear and external auricular canal: Secondary | ICD-10-CM | POA: Diagnosis not present

## 2022-02-03 DIAGNOSIS — R229 Localized swelling, mass and lump, unspecified: Secondary | ICD-10-CM | POA: Diagnosis not present

## 2022-05-12 ENCOUNTER — Other Ambulatory Visit: Payer: Self-pay | Admitting: Cardiothoracic Surgery

## 2022-05-12 DIAGNOSIS — I7121 Aneurysm of the ascending aorta, without rupture: Secondary | ICD-10-CM

## 2022-06-22 ENCOUNTER — Ambulatory Visit
Admission: RE | Admit: 2022-06-22 | Discharge: 2022-06-22 | Disposition: A | Discharge status: Home / Self Care | Payer: Medicare Other | Source: Ambulatory Visit | Attending: Cardiothoracic Surgery | Admitting: Cardiothoracic Surgery

## 2022-06-22 ENCOUNTER — Other Ambulatory Visit: Payer: Self-pay | Admitting: Cardiothoracic Surgery

## 2022-06-22 ENCOUNTER — Ambulatory Visit (INDEPENDENT_AMBULATORY_CARE_PROVIDER_SITE_OTHER): Payer: Medicare Other | Admitting: Cardiothoracic Surgery

## 2022-06-22 ENCOUNTER — Encounter: Payer: Self-pay | Admitting: Cardiothoracic Surgery

## 2022-06-22 VITALS — BP 160/88 | HR 60 | Resp 20 | Ht 67.0 in

## 2022-06-22 DIAGNOSIS — I7121 Aneurysm of the ascending aorta, without rupture: Secondary | ICD-10-CM | POA: Diagnosis not present

## 2022-06-22 DIAGNOSIS — I700 Atherosclerosis of aorta: Secondary | ICD-10-CM | POA: Diagnosis not present

## 2022-06-22 NOTE — Progress Notes (Signed)
HPI: Patient returns for scheduled visit with CT of chest to follow an asymptomatic mild 4.2 cm fusiform ascending aneurysm of the thoracic aorta.  No medical changes since his last visit last year.  He takes valsartan 320 mg daily.  He checks his blood pressure once or twice a week and it has maintained systolic blood pressure [id number] mmHg.  He denies any chest pain.  He is restricted in his activities because of his large abdominal pannus.  I reviewed the images of his CT scan today which shows no change in the 4.2 cm fusiform ascending aneurysm.  No at risk pulmonary findings or mediastinal adenopathy.  Current Outpatient Medications  Medication Sig Dispense Refill   allopurinol (ZYLOPRIM) 300 MG tablet Take 300 mg by mouth daily.     aspirin EC 81 MG tablet Take 81 mg by mouth daily. Swallow whole.     atorvastatin (LIPITOR) 20 MG tablet Take 20 mg by mouth every evening.     Biotin 7500 MCG TABS Take 1 tablet by mouth daily.     Calcium Carbonate (CALTRATE 600 PO) Take 2 each by mouth in the morning and at bedtime.     cholecalciferol (VITAMIN D3) 25 MCG (1000 UNIT) tablet Take 1,000 Units by mouth daily. Take 3 tabs (3000 units total) by mouth once daily.     Coenzyme Q10 (COQ10) 200 MG CAPS Take 1 capsule by mouth in the morning.     Cyanocobalamin (VITAMIN B-12) 5000 MCG TBDP Take 1,000 mcg by mouth daily with lunch.     furosemide (LASIX) 40 MG tablet Take 40 mg by mouth daily.     MAGNESIUM PO Take 380 mg by mouth every evening. Take 2 tabs ('760mg'$  total) by mouth every evening.     Multiple Vitamins-Minerals (CENTRUM SILVER 50+MEN) TABS Take 1 tablet by mouth in the morning.     traMADol (ULTRAM) 50 MG tablet Take 50 mg by mouth 2 (two) times daily as needed.     UNABLE TO FIND Take 3 capsules by mouth daily. Med Name: Balance of Petra Kuba Branded Fruit capsule.     UNABLE TO FIND Take 3 each by mouth daily. Med Name: Balance of Petra Kuba Branded Vegetable Capsules.      valsartan (DIOVAN) 320 MG tablet Take 320 mg by mouth daily.     No current facility-administered medications for this visit.     Physical Exam: Blood pressure (!) 160/88, pulse 60, resp. rate 20, height '5\' 7"'$  (1.702 m), SpO2 98        Exam    General- alert and comfortable    Neck- no JVD, no cervical adenopathy palpable, no carotid bruit   Lungs- clear without rales, wheezes   Cor- regular rate and rhythm, no murmur , gallop   Abdomen- soft, obese, large pannus which descends to the level of his knees   Extremities - warm, non-tender, mild edema   Neuro- oriented, appropriate, no focal weakness %.   Diagnostic Tests: Today CT scan shows stable mild fusiform dilatation of the ascending aorta at 4.2 cm.  Impression: No significant change in his thoracic aorta in the past 5 years.  He has well-controlled blood pressure and is compliant with his cardiac medications including valsartan and atorvastatin.  We will decrease the interval of his CT scans to 18 months.  He knows importance of good blood pressure control to keep systolic pressure less than 150 mmHg  Plan: The patient will return for noncontrasted CT scan of the chest  in 18 months to follow the thoracic aortic dilatation.  The patient has encountered extreme difficulty getting IV access for a contrast study.   Dahlia Byes, MD [facility] [phone]

## 2022-07-23 DIAGNOSIS — M549 Dorsalgia, unspecified: Secondary | ICD-10-CM | POA: Diagnosis not present

## 2022-07-23 DIAGNOSIS — Z6841 Body Mass Index (BMI) 40.0 and over, adult: Secondary | ICD-10-CM | POA: Diagnosis not present

## 2022-07-23 DIAGNOSIS — R0981 Nasal congestion: Secondary | ICD-10-CM | POA: Diagnosis not present

## 2022-09-02 DIAGNOSIS — J343 Hypertrophy of nasal turbinates: Secondary | ICD-10-CM | POA: Diagnosis not present

## 2022-09-02 DIAGNOSIS — J310 Chronic rhinitis: Secondary | ICD-10-CM | POA: Diagnosis not present

## 2022-09-02 DIAGNOSIS — J342 Deviated nasal septum: Secondary | ICD-10-CM | POA: Diagnosis not present

## 2022-09-23 DIAGNOSIS — R399 Unspecified symptoms and signs involving the genitourinary system: Secondary | ICD-10-CM | POA: Diagnosis not present

## 2022-09-29 DIAGNOSIS — R300 Dysuria: Secondary | ICD-10-CM | POA: Diagnosis not present

## 2022-09-29 DIAGNOSIS — N492 Inflammatory disorders of scrotum: Secondary | ICD-10-CM | POA: Diagnosis not present

## 2022-10-27 DIAGNOSIS — L089 Local infection of the skin and subcutaneous tissue, unspecified: Secondary | ICD-10-CM | POA: Diagnosis not present

## 2022-10-27 DIAGNOSIS — Z6841 Body Mass Index (BMI) 40.0 and over, adult: Secondary | ICD-10-CM | POA: Diagnosis not present

## 2022-10-27 DIAGNOSIS — S90821A Blister (nonthermal), right foot, initial encounter: Secondary | ICD-10-CM | POA: Diagnosis not present

## 2022-12-01 DIAGNOSIS — I10 Essential (primary) hypertension: Secondary | ICD-10-CM | POA: Diagnosis not present

## 2022-12-01 DIAGNOSIS — R0609 Other forms of dyspnea: Secondary | ICD-10-CM | POA: Diagnosis not present

## 2022-12-01 DIAGNOSIS — M7989 Other specified soft tissue disorders: Secondary | ICD-10-CM | POA: Diagnosis not present

## 2023-02-24 DIAGNOSIS — Z6841 Body Mass Index (BMI) 40.0 and over, adult: Secondary | ICD-10-CM | POA: Diagnosis not present

## 2023-02-24 DIAGNOSIS — L089 Local infection of the skin and subcutaneous tissue, unspecified: Secondary | ICD-10-CM | POA: Diagnosis not present

## 2023-07-06 DIAGNOSIS — M1990 Unspecified osteoarthritis, unspecified site: Secondary | ICD-10-CM | POA: Diagnosis not present

## 2023-07-06 DIAGNOSIS — R609 Edema, unspecified: Secondary | ICD-10-CM | POA: Diagnosis not present

## 2023-07-06 DIAGNOSIS — M549 Dorsalgia, unspecified: Secondary | ICD-10-CM | POA: Diagnosis not present

## 2023-07-06 DIAGNOSIS — L0390 Cellulitis, unspecified: Secondary | ICD-10-CM | POA: Diagnosis not present

## 2023-07-06 DIAGNOSIS — R300 Dysuria: Secondary | ICD-10-CM | POA: Diagnosis not present

## 2023-07-06 DIAGNOSIS — E65 Localized adiposity: Secondary | ICD-10-CM | POA: Diagnosis not present

## 2023-07-10 DIAGNOSIS — E65 Localized adiposity: Secondary | ICD-10-CM | POA: Diagnosis not present

## 2023-07-10 DIAGNOSIS — Z860100 Personal history of colon polyps, unspecified: Secondary | ICD-10-CM | POA: Diagnosis not present

## 2023-07-10 DIAGNOSIS — M109 Gout, unspecified: Secondary | ICD-10-CM | POA: Diagnosis not present

## 2023-07-10 DIAGNOSIS — G8929 Other chronic pain: Secondary | ICD-10-CM | POA: Diagnosis not present

## 2023-07-10 DIAGNOSIS — I10 Essential (primary) hypertension: Secondary | ICD-10-CM | POA: Diagnosis not present

## 2023-07-10 DIAGNOSIS — Z556 Problems related to health literacy: Secondary | ICD-10-CM | POA: Diagnosis not present

## 2023-07-10 DIAGNOSIS — I712 Thoracic aortic aneurysm, without rupture, unspecified: Secondary | ICD-10-CM | POA: Diagnosis not present

## 2023-07-10 DIAGNOSIS — M549 Dorsalgia, unspecified: Secondary | ICD-10-CM | POA: Diagnosis not present

## 2023-07-10 DIAGNOSIS — L03115 Cellulitis of right lower limb: Secondary | ICD-10-CM | POA: Diagnosis not present

## 2023-07-10 DIAGNOSIS — Z8546 Personal history of malignant neoplasm of prostate: Secondary | ICD-10-CM | POA: Diagnosis not present

## 2023-07-10 DIAGNOSIS — Z6841 Body Mass Index (BMI) 40.0 and over, adult: Secondary | ICD-10-CM | POA: Diagnosis not present

## 2023-07-10 DIAGNOSIS — M1990 Unspecified osteoarthritis, unspecified site: Secondary | ICD-10-CM | POA: Diagnosis not present

## 2023-07-10 DIAGNOSIS — Z85820 Personal history of malignant melanoma of skin: Secondary | ICD-10-CM | POA: Diagnosis not present

## 2023-07-10 DIAGNOSIS — Z604 Social exclusion and rejection: Secondary | ICD-10-CM | POA: Diagnosis not present

## 2023-07-10 DIAGNOSIS — Z79899 Other long term (current) drug therapy: Secondary | ICD-10-CM | POA: Diagnosis not present

## 2023-07-15 DIAGNOSIS — M109 Gout, unspecified: Secondary | ICD-10-CM | POA: Diagnosis not present

## 2023-07-15 DIAGNOSIS — Z6841 Body Mass Index (BMI) 40.0 and over, adult: Secondary | ICD-10-CM | POA: Diagnosis not present

## 2023-07-15 DIAGNOSIS — I10 Essential (primary) hypertension: Secondary | ICD-10-CM | POA: Diagnosis not present

## 2023-07-15 DIAGNOSIS — L03115 Cellulitis of right lower limb: Secondary | ICD-10-CM | POA: Diagnosis not present

## 2023-07-15 DIAGNOSIS — I712 Thoracic aortic aneurysm, without rupture, unspecified: Secondary | ICD-10-CM | POA: Diagnosis not present

## 2023-07-20 DIAGNOSIS — R609 Edema, unspecified: Secondary | ICD-10-CM | POA: Diagnosis not present

## 2023-07-20 DIAGNOSIS — E65 Localized adiposity: Secondary | ICD-10-CM | POA: Diagnosis not present

## 2023-07-20 DIAGNOSIS — T148XXA Other injury of unspecified body region, initial encounter: Secondary | ICD-10-CM | POA: Diagnosis not present

## 2023-07-27 DIAGNOSIS — Z6841 Body Mass Index (BMI) 40.0 and over, adult: Secondary | ICD-10-CM | POA: Diagnosis not present

## 2023-07-27 DIAGNOSIS — L03115 Cellulitis of right lower limb: Secondary | ICD-10-CM | POA: Diagnosis not present

## 2023-07-27 DIAGNOSIS — I10 Essential (primary) hypertension: Secondary | ICD-10-CM | POA: Diagnosis not present

## 2023-07-27 DIAGNOSIS — M109 Gout, unspecified: Secondary | ICD-10-CM | POA: Diagnosis not present

## 2023-07-27 DIAGNOSIS — I712 Thoracic aortic aneurysm, without rupture, unspecified: Secondary | ICD-10-CM | POA: Diagnosis not present

## 2023-08-06 DIAGNOSIS — T148XXA Other injury of unspecified body region, initial encounter: Secondary | ICD-10-CM | POA: Diagnosis not present

## 2023-08-09 DIAGNOSIS — Z556 Problems related to health literacy: Secondary | ICD-10-CM | POA: Diagnosis not present

## 2023-08-09 DIAGNOSIS — I10 Essential (primary) hypertension: Secondary | ICD-10-CM | POA: Diagnosis not present

## 2023-08-09 DIAGNOSIS — G8929 Other chronic pain: Secondary | ICD-10-CM | POA: Diagnosis not present

## 2023-08-09 DIAGNOSIS — L03115 Cellulitis of right lower limb: Secondary | ICD-10-CM | POA: Diagnosis not present

## 2023-08-09 DIAGNOSIS — M549 Dorsalgia, unspecified: Secondary | ICD-10-CM | POA: Diagnosis not present

## 2023-08-09 DIAGNOSIS — E65 Localized adiposity: Secondary | ICD-10-CM | POA: Diagnosis not present

## 2023-08-09 DIAGNOSIS — Z860100 Personal history of colon polyps, unspecified: Secondary | ICD-10-CM | POA: Diagnosis not present

## 2023-08-09 DIAGNOSIS — M109 Gout, unspecified: Secondary | ICD-10-CM | POA: Diagnosis not present

## 2023-08-09 DIAGNOSIS — M1990 Unspecified osteoarthritis, unspecified site: Secondary | ICD-10-CM | POA: Diagnosis not present

## 2023-08-09 DIAGNOSIS — Z6841 Body Mass Index (BMI) 40.0 and over, adult: Secondary | ICD-10-CM | POA: Diagnosis not present

## 2023-08-09 DIAGNOSIS — Z604 Social exclusion and rejection: Secondary | ICD-10-CM | POA: Diagnosis not present

## 2023-08-09 DIAGNOSIS — Z8546 Personal history of malignant neoplasm of prostate: Secondary | ICD-10-CM | POA: Diagnosis not present

## 2023-08-09 DIAGNOSIS — I712 Thoracic aortic aneurysm, without rupture, unspecified: Secondary | ICD-10-CM | POA: Diagnosis not present

## 2023-08-09 DIAGNOSIS — Z79899 Other long term (current) drug therapy: Secondary | ICD-10-CM | POA: Diagnosis not present

## 2023-08-09 DIAGNOSIS — Z85820 Personal history of malignant melanoma of skin: Secondary | ICD-10-CM | POA: Diagnosis not present

## 2023-08-11 DIAGNOSIS — I10 Essential (primary) hypertension: Secondary | ICD-10-CM | POA: Diagnosis not present

## 2023-09-02 DIAGNOSIS — E65 Localized adiposity: Secondary | ICD-10-CM | POA: Diagnosis not present

## 2023-09-03 DIAGNOSIS — M109 Gout, unspecified: Secondary | ICD-10-CM | POA: Diagnosis not present

## 2023-09-03 DIAGNOSIS — I10 Essential (primary) hypertension: Secondary | ICD-10-CM | POA: Diagnosis not present

## 2023-09-03 DIAGNOSIS — Z6841 Body Mass Index (BMI) 40.0 and over, adult: Secondary | ICD-10-CM | POA: Diagnosis not present

## 2023-09-03 DIAGNOSIS — L03115 Cellulitis of right lower limb: Secondary | ICD-10-CM | POA: Diagnosis not present

## 2023-09-03 DIAGNOSIS — I712 Thoracic aortic aneurysm, without rupture, unspecified: Secondary | ICD-10-CM | POA: Diagnosis not present

## 2023-09-04 ENCOUNTER — Observation Stay (HOSPITAL_COMMUNITY)

## 2023-09-04 ENCOUNTER — Emergency Department (HOSPITAL_COMMUNITY)

## 2023-09-04 ENCOUNTER — Other Ambulatory Visit: Payer: Self-pay

## 2023-09-04 ENCOUNTER — Inpatient Hospital Stay (HOSPITAL_COMMUNITY)
Admission: EM | Admit: 2023-09-04 | Discharge: 2023-09-13 | DRG: 872 | Disposition: A | Discharge status: Skilled Nursing Facility | Attending: Obstetrics and Gynecology | Admitting: Obstetrics and Gynecology

## 2023-09-04 ENCOUNTER — Encounter (HOSPITAL_COMMUNITY): Payer: Self-pay

## 2023-09-04 DIAGNOSIS — A419 Sepsis, unspecified organism: Principal | ICD-10-CM | POA: Diagnosis present

## 2023-09-04 DIAGNOSIS — R7989 Other specified abnormal findings of blood chemistry: Secondary | ICD-10-CM | POA: Diagnosis present

## 2023-09-04 DIAGNOSIS — R5381 Other malaise: Secondary | ICD-10-CM | POA: Diagnosis present

## 2023-09-04 DIAGNOSIS — R0600 Dyspnea, unspecified: Secondary | ICD-10-CM | POA: Diagnosis not present

## 2023-09-04 DIAGNOSIS — R531 Weakness: Secondary | ICD-10-CM | POA: Diagnosis not present

## 2023-09-04 DIAGNOSIS — Z1152 Encounter for screening for COVID-19: Secondary | ICD-10-CM

## 2023-09-04 DIAGNOSIS — M6282 Rhabdomyolysis: Secondary | ICD-10-CM | POA: Diagnosis not present

## 2023-09-04 DIAGNOSIS — E7849 Other hyperlipidemia: Secondary | ICD-10-CM | POA: Diagnosis not present

## 2023-09-04 DIAGNOSIS — R6510 Systemic inflammatory response syndrome (SIRS) of non-infectious origin without acute organ dysfunction: Secondary | ICD-10-CM | POA: Diagnosis present

## 2023-09-04 DIAGNOSIS — J9811 Atelectasis: Secondary | ICD-10-CM | POA: Diagnosis not present

## 2023-09-04 DIAGNOSIS — K7460 Unspecified cirrhosis of liver: Secondary | ICD-10-CM | POA: Diagnosis present

## 2023-09-04 DIAGNOSIS — I712 Thoracic aortic aneurysm, without rupture, unspecified: Secondary | ICD-10-CM | POA: Diagnosis not present

## 2023-09-04 DIAGNOSIS — I350 Nonrheumatic aortic (valve) stenosis: Secondary | ICD-10-CM | POA: Diagnosis present

## 2023-09-04 DIAGNOSIS — J90 Pleural effusion, not elsewhere classified: Secondary | ICD-10-CM | POA: Diagnosis not present

## 2023-09-04 DIAGNOSIS — M793 Panniculitis, unspecified: Secondary | ICD-10-CM | POA: Diagnosis present

## 2023-09-04 DIAGNOSIS — W010XXA Fall on same level from slipping, tripping and stumbling without subsequent striking against object, initial encounter: Secondary | ICD-10-CM | POA: Diagnosis present

## 2023-09-04 DIAGNOSIS — N179 Acute kidney failure, unspecified: Secondary | ICD-10-CM | POA: Diagnosis present

## 2023-09-04 DIAGNOSIS — Z7982 Long term (current) use of aspirin: Secondary | ICD-10-CM

## 2023-09-04 DIAGNOSIS — M549 Dorsalgia, unspecified: Secondary | ICD-10-CM | POA: Diagnosis present

## 2023-09-04 DIAGNOSIS — M6281 Muscle weakness (generalized): Secondary | ICD-10-CM | POA: Diagnosis not present

## 2023-09-04 DIAGNOSIS — R262 Difficulty in walking, not elsewhere classified: Secondary | ICD-10-CM | POA: Diagnosis not present

## 2023-09-04 DIAGNOSIS — D509 Iron deficiency anemia, unspecified: Secondary | ICD-10-CM | POA: Diagnosis not present

## 2023-09-04 DIAGNOSIS — Z6841 Body Mass Index (BMI) 40.0 and over, adult: Secondary | ICD-10-CM | POA: Diagnosis not present

## 2023-09-04 DIAGNOSIS — E538 Deficiency of other specified B group vitamins: Secondary | ICD-10-CM | POA: Diagnosis not present

## 2023-09-04 DIAGNOSIS — M1990 Unspecified osteoarthritis, unspecified site: Secondary | ICD-10-CM | POA: Diagnosis present

## 2023-09-04 DIAGNOSIS — I517 Cardiomegaly: Secondary | ICD-10-CM | POA: Diagnosis not present

## 2023-09-04 DIAGNOSIS — E785 Hyperlipidemia, unspecified: Secondary | ICD-10-CM | POA: Diagnosis not present

## 2023-09-04 DIAGNOSIS — L03311 Cellulitis of abdominal wall: Secondary | ICD-10-CM | POA: Diagnosis present

## 2023-09-04 DIAGNOSIS — R918 Other nonspecific abnormal finding of lung field: Secondary | ICD-10-CM | POA: Diagnosis not present

## 2023-09-04 DIAGNOSIS — Z833 Family history of diabetes mellitus: Secondary | ICD-10-CM | POA: Diagnosis not present

## 2023-09-04 DIAGNOSIS — Z888 Allergy status to other drugs, medicaments and biological substances status: Secondary | ICD-10-CM

## 2023-09-04 DIAGNOSIS — Z7401 Bed confinement status: Secondary | ICD-10-CM | POA: Diagnosis not present

## 2023-09-04 DIAGNOSIS — Z87440 Personal history of urinary (tract) infections: Secondary | ICD-10-CM

## 2023-09-04 DIAGNOSIS — I272 Pulmonary hypertension, unspecified: Secondary | ICD-10-CM | POA: Diagnosis not present

## 2023-09-04 DIAGNOSIS — K7581 Nonalcoholic steatohepatitis (NASH): Secondary | ICD-10-CM | POA: Diagnosis present

## 2023-09-04 DIAGNOSIS — Z9181 History of falling: Secondary | ICD-10-CM | POA: Diagnosis not present

## 2023-09-04 DIAGNOSIS — G4733 Obstructive sleep apnea (adult) (pediatric): Secondary | ICD-10-CM | POA: Diagnosis present

## 2023-09-04 DIAGNOSIS — Z860100 Personal history of colon polyps, unspecified: Secondary | ICD-10-CM

## 2023-09-04 DIAGNOSIS — R509 Fever, unspecified: Secondary | ICD-10-CM | POA: Diagnosis not present

## 2023-09-04 DIAGNOSIS — B351 Tinea unguium: Secondary | ICD-10-CM | POA: Diagnosis not present

## 2023-09-04 DIAGNOSIS — K219 Gastro-esophageal reflux disease without esophagitis: Secondary | ICD-10-CM | POA: Diagnosis present

## 2023-09-04 DIAGNOSIS — M314 Aortic arch syndrome [Takayasu]: Secondary | ICD-10-CM | POA: Diagnosis not present

## 2023-09-04 DIAGNOSIS — R000 Tachycardia, unspecified: Secondary | ICD-10-CM | POA: Diagnosis not present

## 2023-09-04 DIAGNOSIS — I7121 Aneurysm of the ascending aorta, without rupture: Secondary | ICD-10-CM | POA: Diagnosis present

## 2023-09-04 DIAGNOSIS — R0602 Shortness of breath: Secondary | ICD-10-CM | POA: Diagnosis not present

## 2023-09-04 DIAGNOSIS — L309 Dermatitis, unspecified: Secondary | ICD-10-CM | POA: Diagnosis present

## 2023-09-04 DIAGNOSIS — I714 Abdominal aortic aneurysm, without rupture, unspecified: Secondary | ICD-10-CM | POA: Diagnosis not present

## 2023-09-04 DIAGNOSIS — Z79899 Other long term (current) drug therapy: Secondary | ICD-10-CM

## 2023-09-04 DIAGNOSIS — Y92002 Bathroom of unspecified non-institutional (private) residence single-family (private) house as the place of occurrence of the external cause: Secondary | ICD-10-CM

## 2023-09-04 DIAGNOSIS — E66813 Obesity, class 3: Secondary | ICD-10-CM | POA: Diagnosis not present

## 2023-09-04 DIAGNOSIS — Z881 Allergy status to other antibiotic agents status: Secondary | ICD-10-CM

## 2023-09-04 DIAGNOSIS — R0689 Other abnormalities of breathing: Secondary | ICD-10-CM | POA: Diagnosis not present

## 2023-09-04 DIAGNOSIS — I10 Essential (primary) hypertension: Secondary | ICD-10-CM | POA: Diagnosis present

## 2023-09-04 DIAGNOSIS — E559 Vitamin D deficiency, unspecified: Secondary | ICD-10-CM | POA: Diagnosis not present

## 2023-09-04 DIAGNOSIS — I2510 Atherosclerotic heart disease of native coronary artery without angina pectoris: Secondary | ICD-10-CM | POA: Diagnosis not present

## 2023-09-04 DIAGNOSIS — L03818 Cellulitis of other sites: Secondary | ICD-10-CM | POA: Diagnosis not present

## 2023-09-04 DIAGNOSIS — Z8546 Personal history of malignant neoplasm of prostate: Secondary | ICD-10-CM

## 2023-09-04 DIAGNOSIS — M109 Gout, unspecified: Secondary | ICD-10-CM | POA: Diagnosis not present

## 2023-09-04 DIAGNOSIS — R609 Edema, unspecified: Secondary | ICD-10-CM | POA: Diagnosis not present

## 2023-09-04 DIAGNOSIS — R0989 Other specified symptoms and signs involving the circulatory and respiratory systems: Secondary | ICD-10-CM | POA: Diagnosis not present

## 2023-09-04 DIAGNOSIS — G8929 Other chronic pain: Secondary | ICD-10-CM | POA: Diagnosis present

## 2023-09-04 LAB — COMPREHENSIVE METABOLIC PANEL WITH GFR
ALT: 29 U/L (ref 0–44)
AST: 39 U/L (ref 15–41)
Albumin: 3.5 g/dL (ref 3.5–5.0)
Alkaline Phosphatase: 71 U/L (ref 38–126)
Anion gap: 8 (ref 5–15)
BUN: 22 mg/dL (ref 8–23)
CO2: 28 mmol/L (ref 22–32)
Calcium: 9 mg/dL (ref 8.9–10.3)
Chloride: 100 mmol/L (ref 98–111)
Creatinine, Ser: 1.43 mg/dL — ABNORMAL HIGH (ref 0.61–1.24)
GFR, Estimated: 50 mL/min — ABNORMAL LOW (ref >60)
Glucose, Bld: 117 mg/dL — ABNORMAL HIGH (ref 70–99)
Potassium: 4.1 mmol/L (ref 3.5–5.1)
Sodium: 136 mmol/L (ref 135–145)
Total Bilirubin: 0.9 mg/dL (ref 0.0–1.2)
Total Protein: 8 g/dL (ref 6.5–8.1)

## 2023-09-04 LAB — CBC WITH DIFFERENTIAL/PLATELET
Abs Immature Granulocytes: 0.1 10*3/uL — ABNORMAL HIGH (ref 0.00–0.07)
Basophils Absolute: 0 10*3/uL (ref 0.0–0.1)
Basophils Relative: 0 %
Eosinophils Absolute: 0.8 10*3/uL — ABNORMAL HIGH (ref 0.0–0.5)
Eosinophils Relative: 4 %
HCT: 37.9 % — ABNORMAL LOW (ref 39.0–52.0)
Hemoglobin: 11.7 g/dL — ABNORMAL LOW (ref 13.0–17.0)
Immature Granulocytes: 1 %
Lymphocytes Relative: 3 %
Lymphs Abs: 0.6 10*3/uL — ABNORMAL LOW (ref 0.7–4.0)
MCH: 27.4 pg (ref 26.0–34.0)
MCHC: 30.9 g/dL (ref 30.0–36.0)
MCV: 88.8 fL (ref 80.0–100.0)
Monocytes Absolute: 1.4 10*3/uL — ABNORMAL HIGH (ref 0.1–1.0)
Monocytes Relative: 7 %
Neutro Abs: 17.2 10*3/uL — ABNORMAL HIGH (ref 1.7–7.7)
Neutrophils Relative %: 85 %
Platelets: 272 10*3/uL (ref 150–400)
RBC: 4.27 MIL/uL (ref 4.22–5.81)
RDW: 15.9 % — ABNORMAL HIGH (ref 11.5–15.5)
WBC: 20.1 10*3/uL — ABNORMAL HIGH (ref 4.0–10.5)
nRBC: 0 % (ref 0.0–0.2)

## 2023-09-04 LAB — RESP PANEL BY RT-PCR (RSV, FLU A&B, COVID)  RVPGX2
Influenza A by PCR: NEGATIVE
Influenza B by PCR: NEGATIVE
Resp Syncytial Virus by PCR: NEGATIVE
SARS Coronavirus 2 by RT PCR: NEGATIVE

## 2023-09-04 LAB — I-STAT CG4 LACTIC ACID, ED: Lactic Acid, Venous: 1.3 mmol/L (ref 0.5–1.9)

## 2023-09-04 LAB — MRSA NEXT GEN BY PCR, NASAL: MRSA by PCR Next Gen: NOT DETECTED

## 2023-09-04 LAB — PROTIME-INR
INR: 1 (ref 0.8–1.2)
Prothrombin Time: 13.8 s (ref 11.4–15.2)

## 2023-09-04 MED ORDER — TRAMADOL HCL 50 MG PO TABS
50.0000 mg | ORAL_TABLET | Freq: Four times a day (QID) | ORAL | Status: DC | PRN
  Administered 2023-09-05 – 2023-09-11 (×3): 50 mg via ORAL
  Filled 2023-09-04 (×3): qty 1

## 2023-09-04 MED ORDER — METRONIDAZOLE 500 MG/100ML IV SOLN
500.0000 mg | Freq: Two times a day (BID) | INTRAVENOUS | Status: DC
  Administered 2023-09-05: 500 mg via INTRAVENOUS
  Filled 2023-09-04: qty 100

## 2023-09-04 MED ORDER — VANCOMYCIN HCL 2000 MG/400ML IV SOLN
2000.0000 mg | Freq: Once | INTRAVENOUS | Status: AC
  Administered 2023-09-04: 2000 mg via INTRAVENOUS
  Filled 2023-09-04: qty 400

## 2023-09-04 MED ORDER — SODIUM CHLORIDE 0.9 % IV SOLN: INTRAVENOUS | Status: AC

## 2023-09-04 MED ORDER — ATORVASTATIN CALCIUM 10 MG PO TABS
20.0000 mg | ORAL_TABLET | Freq: Every evening | ORAL | Status: DC
  Administered 2023-09-04: 20 mg via ORAL
  Filled 2023-09-04: qty 2

## 2023-09-04 MED ORDER — SODIUM CHLORIDE 0.9 % IV SOLN
2.0000 g | Freq: Once | INTRAVENOUS | Status: AC
  Administered 2023-09-04: 2 g via INTRAVENOUS
  Filled 2023-09-04: qty 12.5

## 2023-09-04 MED ORDER — ONDANSETRON HCL 4 MG PO TABS
4.0000 mg | ORAL_TABLET | Freq: Four times a day (QID) | ORAL | Status: DC | PRN
  Administered 2023-09-05: 4 mg via ORAL
  Filled 2023-09-04: qty 1

## 2023-09-04 MED ORDER — LACTATED RINGERS IV SOLN: INTRAVENOUS | Status: AC

## 2023-09-04 MED ORDER — METRONIDAZOLE 500 MG/100ML IV SOLN: 500.0000 mg | Freq: Once | INTRAVENOUS | Status: DC

## 2023-09-04 MED ORDER — ONDANSETRON HCL 4 MG/2ML IJ SOLN
4.0000 mg | Freq: Four times a day (QID) | INTRAMUSCULAR | Status: DC | PRN
  Administered 2023-09-05: 4 mg via INTRAVENOUS
  Filled 2023-09-04: qty 2

## 2023-09-04 MED ORDER — NYSTATIN 100000 UNIT/GM EX POWD
Freq: Three times a day (TID) | CUTANEOUS | Status: DC
  Administered 2023-09-05: 1 via TOPICAL
  Filled 2023-09-04 (×5): qty 15

## 2023-09-04 MED ORDER — HYDROCODONE-ACETAMINOPHEN 5-325 MG PO TABS
1.0000 | ORAL_TABLET | ORAL | Status: DC | PRN
  Administered 2023-09-06 – 2023-09-09 (×4): 2 via ORAL
  Administered 2023-09-10: 1 via ORAL
  Administered 2023-09-11: 2 via ORAL
  Filled 2023-09-04 (×6): qty 2

## 2023-09-04 MED ORDER — ACETAMINOPHEN 650 MG RE SUPP: 650.0000 mg | Freq: Four times a day (QID) | RECTAL | Status: DC | PRN

## 2023-09-04 MED ORDER — ALBUTEROL SULFATE (2.5 MG/3ML) 0.083% IN NEBU
2.5000 mg | INHALATION_SOLUTION | RESPIRATORY_TRACT | Status: DC | PRN
  Administered 2023-09-06 – 2023-09-08 (×3): 2.5 mg via RESPIRATORY_TRACT
  Filled 2023-09-04 (×4): qty 3

## 2023-09-04 MED ORDER — SODIUM CHLORIDE 0.9 % IV SOLN
2.0000 g | INTRAVENOUS | Status: DC
  Administered 2023-09-05: 2 g via INTRAVENOUS
  Filled 2023-09-04: qty 12.5

## 2023-09-04 MED ORDER — LACTATED RINGERS IV BOLUS (SEPSIS)
1000.0000 mL | Freq: Once | INTRAVENOUS | Status: AC
  Administered 2023-09-04: 1000 mL via INTRAVENOUS

## 2023-09-04 MED ORDER — ACETAMINOPHEN 325 MG PO TABS
650.0000 mg | ORAL_TABLET | Freq: Four times a day (QID) | ORAL | Status: DC | PRN
  Administered 2023-09-05 – 2023-09-12 (×3): 650 mg via ORAL
  Filled 2023-09-04 (×4): qty 2

## 2023-09-04 MED ORDER — ACETAMINOPHEN 325 MG PO TABS
650.0000 mg | ORAL_TABLET | Freq: Once | ORAL | Status: AC
  Administered 2023-09-04: 650 mg via ORAL
  Filled 2023-09-04: qty 2

## 2023-09-04 MED ORDER — VANCOMYCIN HCL 2000 MG/400ML IV SOLN
2000.0000 mg | INTRAVENOUS | Status: DC
  Administered 2023-09-05: 2000 mg via INTRAVENOUS
  Filled 2023-09-04: qty 400

## 2023-09-04 MED ORDER — ASPIRIN 81 MG PO TBEC
81.0000 mg | DELAYED_RELEASE_TABLET | Freq: Every day | ORAL | Status: DC
  Administered 2023-09-04 – 2023-09-13 (×10): 81 mg via ORAL
  Filled 2023-09-04 (×10): qty 1

## 2023-09-04 MED ORDER — VANCOMYCIN HCL IN DEXTROSE 1-5 GM/200ML-% IV SOLN: 1000.0000 mg | Freq: Once | INTRAVENOUS | Status: DC

## 2023-09-04 MED ORDER — ALUM & MAG HYDROXIDE-SIMETH 200-200-20 MG/5ML PO SUSP
30.0000 mL | ORAL | Status: DC | PRN
  Administered 2023-09-04 – 2023-09-07 (×5): 30 mL via ORAL
  Filled 2023-09-04 (×5): qty 30

## 2023-09-04 NOTE — Progress Notes (Signed)
 Elink following for sepsis protocol.

## 2023-09-04 NOTE — Subjective & Objective (Signed)
 Reports was too weak to get back up when he slipped in the bathroom  Has been in his house for 1 year due to his weight Had an ulcer on the top of his right he was treated with Bactrim  Denies any CP no SOB has been urinating much more frequently, he has to stand up to pee due to panus No change in color of urine

## 2023-09-04 NOTE — Assessment & Plan Note (Signed)
 Order PT OT evaluation prior to discharge

## 2023-09-04 NOTE — Assessment & Plan Note (Addendum)
 Obtain electrolytes  rehydrate and follow Attempt to evaluate for any evidence of retention although not consistent per history

## 2023-09-04 NOTE — Progress Notes (Signed)
 Pharmacy Antibiotic Note  Curtis Hill is a 78 y.o. male for which pharmacy has been consulted for cefepime and vancomycin  dosing for sepsis.  Patient with a history of obesity, HTN, HLD, aortic aneurysm. Patient presenting with weakness.  SCr 1.43 - in 2023 was 0.94 WBC 20.1; LA 1.3; T 100.6; HR 115; RR 31 COVID neg / flu neg  Plan: Metronidazole per MD Cefepime 2g q8hr  Vancomycin  2000 mg q24hr (eAUC 464.5) unless change in renal function Monitor WBC, fever, renal function, cultures De-escalate when able Levels at steady state  Height: 5\' 7"  (170.2 cm) Weight: (!) 226.8 kg (500 lb) IBW/kg (Calculated) : 66.1  Temp (24hrs), Avg:101.6 F (38.7 C), Min:100.6 F (38.1 C), Max:102.5 F (39.2 C)  Recent Labs  Lab 09/04/23 1714 09/04/23 1725  WBC 20.1*  --   CREATININE 1.43*  --   LATICACIDVEN  --  1.3    Estimated Creatinine Clearance: 79.8 mL/min (A) (by C-G formula based on SCr of 1.43 mg/dL (H)).    Allergies  Allergen Reactions   Levofloxacin Other (See Comments)    Aortic aneurysm   Lisinopril Shortness Of Breath, Swelling, Rash and Anaphylaxis   Microbiology results: Pending  Thank you for allowing pharmacy to be a part of this patient's care.  Dionicio Fray, PharmD, BCPS 09/04/2023 7:45 PM ED Clinical Pharmacist -  [phone]

## 2023-09-04 NOTE — Assessment & Plan Note (Signed)
 Chronic stable followed by CT surgery

## 2023-09-04 NOTE — Assessment & Plan Note (Signed)
 Contributing to comorbidity and complicating medical management  Body mass index is 78.31 kg/m.  Nutritional follow up as an out pt would be recommended

## 2023-09-04 NOTE — ED Notes (Signed)
 This nurse called [facility] to have patient monitored.

## 2023-09-04 NOTE — Assessment & Plan Note (Signed)
 Obtain anemia panel  Transfuse for Hg <7 , rapidly dropping or  if symptomatic

## 2023-09-04 NOTE — Assessment & Plan Note (Signed)
 Order CPAP.
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
## 2023-09-04 NOTE — ED Notes (Addendum)
 This nurse called 3E, 3E stated they are ready. Report given to [facility], [place]
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
## 2023-09-04 NOTE — Assessment & Plan Note (Signed)
-  SIRS criteria met with  elevated white blood cell count,       Component Value Date/Time   WBC 20.1 (H) 09/04/2023 1714   LYMPHSABS 0.6 (L) 09/04/2023 1714   LYMPHSABS 1.6 05/23/2020 1107   LYMPHSABS 3.3 03/15/2007 1313     tachycardia   ,   fever   RR >20 Today's Vitals   09/04/23 1830 09/04/23 1853 09/04/23 1927 09/04/23 1928  BP: (!) 194/110     Pulse: (!) 115     Resp: (!) 31     Temp:  (!) 100.6 F (38.1 C)    TempSrc:  Oral    SpO2: 98%     Weight:      Height:      PainSc:   2  2    Body mass index is 78.31 kg/m.  -Most likely source being: Source of sepsis is unknown but given clinical picture will continue to treat     - Obtain serial lactic acid and procalcitonin level.  - Initiated IV antibiotics in ER: Antibiotics Given (last 72 hours)     Date/Time Action Medication Dose Rate   09/04/23 1755 New Bag/Given   ceFEPIme (MAXIPIME) 2 g in sodium chloride  0.9 % 100 mL IVPB 2 g 200 mL/hr   09/04/23 1835 New Bag/Given   vancomycin  (VANCOREADY) IVPB 2000 mg/400 mL 2,000 mg 200 mL/hr       Will continue  on :  cefepime, vanco and flagyl   - await results of blood and urine culture  - Rehydrate aggressively  Intravenous fluids were administered,       8:00 PM

## 2023-09-04 NOTE — Assessment & Plan Note (Signed)
Continue Lipitor 20 mg po q day

## 2023-09-04 NOTE — ED Notes (Signed)
 2nd set of blood cultures unable to be collected after RN attempt and Phlebotomy attempt. First antibiotics started.

## 2023-09-04 NOTE — ED Notes (Signed)
 Patient readjusted in bed.

## 2023-09-04 NOTE — ED Triage Notes (Signed)
 Patient BIB EMS from home after he was using the bathroom and lost his footing a bit and called EMS to assist in helping him up. EMS states the patient just recently had an abscess drained and was on SULFA antibiotics that he just finished on the 15th, but has not been reevaluated after finishing the antibiotics. Patient was found prone by the EMS on the floor and found tachypnic, tachycardic, and hot to the touch. Patient had a fever of 102.7 with EMS. Patient had taken 500 mg of Tylenol  at 1300.

## 2023-09-04 NOTE — Assessment & Plan Note (Signed)
 Possible source of the infection  For now continue broad spectrum antibiotics

## 2023-09-04 NOTE — Assessment & Plan Note (Signed)
Hold ARB given AKI

## 2023-09-04 NOTE — ED Provider Notes (Signed)
 [place] EMERGENCY DEPARTMENT AT [facility] Provider Note   CSN: [id number] Arrival date & time: 09/04/23  1639     History  Chief Complaint  Patient presents with   Possible Sepsis    Curtis Hill is a 78 y.o. male.  Patient is a 78 year old male with a past medical history of hypertension, OSA, morbid obesity presenting to the emergency department for weakness.  Per EMS, the patient was attempting to get up after going to the bathroom and the patient states that his legs were too weak underneath him to be able to stand up.  He was found prone by EMS.  He denies hitting his head to me and states that he just laid on the ground.  EMS reports that he was tachycardic and febrile and route.  The patient states that he did feel chilled last night but has not noted any fevers at home.  Denies any recent nausea, vomiting, diarrhea, dysuria or hematuria.  He states he has had a mild cough but denies any shortness of breath.  Of note, the patient states that he was recently treated for an ulcer infection to his right foot and UTI and completed antibiotics about 10 days ago.  He states that his foot looks significantly improved since antibiotics.        Home Medications Prior to Admission medications   Medication Sig Start Date End Date Taking? Authorizing Provider  allopurinol (ZYLOPRIM) 300 MG tablet Take 300 mg by mouth daily.    [provider]  aspirin  EC 81 MG tablet Take 81 mg by mouth daily. Swallow whole.    [provider]  atorvastatin  (LIPITOR) 20 MG tablet Take 20 mg by mouth every evening.    [provider]  Biotin 7500 MCG TABS Take 1 tablet by mouth daily.    [provider]  Calcium  Carbonate (CALTRATE 600 PO) Take 2 each by mouth in the morning and at bedtime.    [provider]  cholecalciferol (VITAMIN D3) 25 MCG (1000 UNIT) tablet Take 1,000 Units by mouth daily. Take 3 tabs (3000 units total) by mouth once daily.     [provider]  Coenzyme Q10 (COQ10) 200 MG CAPS Take 1 capsule by mouth in the morning.    [provider]  Cyanocobalamin (VITAMIN B-12) 5000 MCG TBDP Take 1,000 mcg by mouth daily with lunch.    [provider]  furosemide  (LASIX ) 40 MG tablet Take 40 mg by mouth daily.    [provider]  MAGNESIUM PO Take 380 mg by mouth every evening. Take 2 tabs (760mg  total) by mouth every evening.    [provider]  Multiple Vitamins-Minerals (CENTRUM SILVER 50+MEN) TABS Take 1 tablet by mouth in the morning.    [provider]  traMADol (ULTRAM) 50 MG tablet Take 50 mg by mouth 2 (two) times daily as needed.    [provider]  UNABLE TO FIND Take 3 capsules by mouth daily. Med Name: Balance of Lonne Roan Branded Fruit capsule.    [provider]  UNABLE TO FIND Take 3 each by mouth daily. Med Name: Balance of Lonne Roan Branded Vegetable Capsules.    [provider]  valsartan (DIOVAN) 320 MG tablet Take 320 mg by mouth daily.    [provider]      Allergies    Levofloxacin and Lisinopril    Review of Systems   Review of Systems  Physical Exam Updated Vital Signs BP Aaron Aas)  194/110   Pulse (!) 115   Temp (!) 100.6 F (38.1 C) (Oral)   Resp (!) 31   Ht 5\' 7"  (1.702 m)   Wt (!) 226.8 kg   SpO2 98%   BMI 78.31 kg/m  Physical Exam Vitals and nursing note reviewed.  Constitutional:      General: He is not in acute distress.    Appearance: Normal appearance. He is obese.  HENT:     Head: Normocephalic and atraumatic.     Nose: Nose normal.     Mouth/Throat:     Mouth: Mucous membranes are moist.     Pharynx: Oropharynx is clear.  Eyes:     Extraocular Movements: Extraocular movements intact.     Conjunctiva/sclera: Conjunctivae normal.     Pupils: Pupils are equal, round, and reactive to light.  Cardiovascular:     Rate and Rhythm: Regular rhythm. Tachycardia present.     Heart sounds: Normal heart  sounds.  Pulmonary:     Effort: Pulmonary effort is normal.     Breath sounds: Normal breath sounds.  Abdominal:     General: Abdomen is flat.     Palpations: Abdomen is soft.     Tenderness: There is no abdominal tenderness.  Musculoskeletal:        General: Normal range of motion.     Cervical back: Normal range of motion.  Skin:    General: Skin is warm and dry.     Comments: Large panus with fungal rash to fold Erythema to dorsum of R foot but no warmth to touch, no open wounds  Neurological:     General: No focal deficit present.     Mental Status: He is alert and oriented to person, place, and time.     Cranial Nerves: No cranial nerve deficit.     Sensory: No sensory deficit.     Motor: No weakness.  Psychiatric:        Mood and Affect: Mood normal.        Behavior: Behavior normal.     ED Results / Procedures / Treatments   Labs (all labs ordered are listed, but only abnormal results are displayed) Labs Reviewed  COMPREHENSIVE METABOLIC PANEL WITH GFR - Abnormal; Notable for the following components:      Result Value   Glucose, Bld 117 (*)    Creatinine, Ser 1.43 (*)    GFR, Estimated 50 (*)    All other components within normal limits  CBC WITH DIFFERENTIAL/PLATELET - Abnormal; Notable for the following components:   WBC 20.1 (*)    Hemoglobin 11.7 (*)    HCT 37.9 (*)    RDW 15.9 (*)    Neutro Abs 17.2 (*)    Lymphs Abs 0.6 (*)    Monocytes Absolute 1.4 (*)    Eosinophils Absolute 0.8 (*)    Abs Immature Granulocytes 0.10 (*)    All other components within normal limits  RESP PANEL BY RT-PCR (RSV, FLU A&B, COVID)  [id number]  CULTURE, BLOOD (ROUTINE X 2)  CULTURE, BLOOD (ROUTINE X 2)  PROTIME-INR  URINALYSIS, W/ REFLEX TO CULTURE (INFECTION SUSPECTED)  I-STAT CG4 LACTIC ACID, ED    EKG EKG Interpretation Date/Time:  Saturday Sep 04 2023 16:41:24 EDT Ventricular Rate:  117 PR Interval:  134 QRS Duration:  145 QT Interval:  347 QTC  Calculation: 485 R Axis:   -84  Text Interpretation: Sinus tachycardia Atrial premature complex RBBB and LAFB Probable left ventricular hypertrophy No significant  change since last tracing Confirmed by Celesta Coke ([id number]) on 09/04/2023 4:44:33 PM  Radiology DG Chest Port 1 View Result Date: 09/04/2023 CLINICAL DATA:  Possible sepsis. EXAM: PORTABLE CHEST 1 VIEW COMPARISON:  10/25/2016. FINDINGS: Evaluation is limited due to patient's body habitus and rotation. The heart is enlarged. Evaluation of the mediastinal structures is limited. The tracheal column appears patent. Atelectasis is noted at the left lung base. There is a small right pleural effusion with atelectasis or infiltrate. No pneumothorax is seen. No acute osseous abnormality. IMPRESSION: 1. Small right pleural effusion with atelectasis or infiltrate. 2. Left basilar atelectasis. 3. Cardiomegaly. Electronically Signed   By: Wyvonnia Heimlich M.D.   On: 09/04/2023 18:06    Procedures .Critical Care  Performed by: Kingsley, Deamber Buckhalter K, DO Authorized by: Nolberto Batty, DO   Critical care provider statement:    Critical care time (minutes):  30   Critical care was necessary to treat or prevent imminent or life-threatening deterioration of the following conditions:  Sepsis   Critical care was time spent personally by me on the following activities:  Development of treatment plan with patient or surrogate, discussions with consultants, evaluation of patient's response to treatment, examination of patient, ordering and review of laboratory studies, ordering and review of radiographic studies, ordering and performing treatments and interventions, pulse oximetry, re-evaluation of patient's condition and review of old charts     Medications Ordered in ED Medications  lactated ringers  infusion (has no administration in time range)  metroNIDAZOLE (FLAGYL) IVPB 500 mg (has no administration in time range)  vancomycin  (VANCOREADY) IVPB  2000 mg/400 mL (2,000 mg Intravenous New Bag/Given 09/04/23 1835)  lactated ringers  bolus 1,000 mL (1,000 mLs Intravenous New Bag/Given 09/04/23 1753)  ceFEPIme (MAXIPIME) 2 g in sodium chloride  0.9 % 100 mL IVPB (0 g Intravenous Stopped 09/04/23 1831)  acetaminophen  (TYLENOL ) tablet 650 mg (650 mg Oral Given 09/04/23 1801)    ED Course/ Medical Decision Making/ A&P Clinical Course as of 09/04/23 1857  Sat Sep 04, 2023  1815 Leukocytosis, mild AKI compared to last labs 2 years ago. [VK]  1816 Small R pleural effusion with cardiomegaly on CXR. UA is pending. [VK]    Clinical Course User Index [VK] Kingsley, Cyerra Yim K, DO                                 Medical Decision Making This patient presents to the ED with chief complaint(s) of weakness with pertinent past medical history of morbid obesity, OSA, HTN which further complicates the presenting complaint. The complaint involves an extensive differential diagnosis and also carries with it a high risk of complications and morbidity.    The differential diagnosis includes sepsis, UTI, no focal neurologic deficits making CVA less likely, pneumonia, no obvious cellulitis, dehydration, electrolyte derangement, viral syndrome  Additional history obtained: Additional history obtained from EMS  Records reviewed Care Everywhere/External Records  ED Course and Reassessment: On patient's arrival he is febrile and tachycardic otherwise hemodynamically stable in no acute distress.  The patient did have dopplerable pulses of his feet and does still have some erythema to the dorsum of his foot but no warmth to touch no open wounds and no obvious cellulitis, he does have appearance of a fungal infection in the folds of his large pannus.  Will have workup performed to evaluate for cause of his sepsis.  Will be given Tylenol  for his fever and started  on fluids and antibiotics.  He is not have any apparent injuries from being unable to get up after going to the  bathroom.  Will be closely reassessed.  Independent labs interpretation:  The following labs were independently interpreted: leukocytosis, mild AKI  Independent visualization of imaging: - I independently visualized the following imaging with scope of interpretation limited to determining acute life threatening conditions related to emergency care: CXR, which revealed R-sided pleural effusion with cardiomegaly  Consultation: - Consulted or discussed management/test interpretation w/ external professional: hospitalist  Consideration for admission or further workup: patient requires admission for sepsis Social Determinants of health: N/A    Amount and/or Complexity of Data Reviewed Labs: ordered. Radiology: ordered.  Risk OTC drugs. Prescription drug management. Decision regarding hospitalization.          Final Clinical Impression(s) / ED Diagnoses Final diagnoses:  Sepsis without acute organ dysfunction, due to unspecified organism [facility])    Rx / DC Orders ED Discharge Orders     None         Kingsley, Blaike Vickers K, DO 09/04/23 1857

## 2023-09-05 ENCOUNTER — Other Ambulatory Visit (HOSPITAL_COMMUNITY)

## 2023-09-05 DIAGNOSIS — R6510 Systemic inflammatory response syndrome (SIRS) of non-infectious origin without acute organ dysfunction: Secondary | ICD-10-CM | POA: Diagnosis not present

## 2023-09-05 DIAGNOSIS — Z833 Family history of diabetes mellitus: Secondary | ICD-10-CM | POA: Diagnosis not present

## 2023-09-05 DIAGNOSIS — N179 Acute kidney failure, unspecified: Secondary | ICD-10-CM | POA: Diagnosis present

## 2023-09-05 DIAGNOSIS — Z6841 Body Mass Index (BMI) 40.0 and over, adult: Secondary | ICD-10-CM | POA: Diagnosis not present

## 2023-09-05 DIAGNOSIS — Y92002 Bathroom of unspecified non-institutional (private) residence single-family (private) house as the place of occurrence of the external cause: Secondary | ICD-10-CM | POA: Diagnosis not present

## 2023-09-05 DIAGNOSIS — I2510 Atherosclerotic heart disease of native coronary artery without angina pectoris: Secondary | ICD-10-CM | POA: Diagnosis present

## 2023-09-05 DIAGNOSIS — A419 Sepsis, unspecified organism: Secondary | ICD-10-CM | POA: Diagnosis present

## 2023-09-05 DIAGNOSIS — G4733 Obstructive sleep apnea (adult) (pediatric): Secondary | ICD-10-CM | POA: Diagnosis present

## 2023-09-05 DIAGNOSIS — J90 Pleural effusion, not elsewhere classified: Secondary | ICD-10-CM | POA: Diagnosis present

## 2023-09-05 DIAGNOSIS — D509 Iron deficiency anemia, unspecified: Secondary | ICD-10-CM | POA: Diagnosis present

## 2023-09-05 DIAGNOSIS — Z1152 Encounter for screening for COVID-19: Secondary | ICD-10-CM | POA: Diagnosis not present

## 2023-09-05 DIAGNOSIS — R5381 Other malaise: Secondary | ICD-10-CM | POA: Diagnosis present

## 2023-09-05 DIAGNOSIS — M793 Panniculitis, unspecified: Secondary | ICD-10-CM | POA: Diagnosis present

## 2023-09-05 DIAGNOSIS — R7989 Other specified abnormal findings of blood chemistry: Secondary | ICD-10-CM | POA: Diagnosis present

## 2023-09-05 DIAGNOSIS — E66813 Obesity, class 3: Secondary | ICD-10-CM | POA: Diagnosis present

## 2023-09-05 DIAGNOSIS — I10 Essential (primary) hypertension: Secondary | ICD-10-CM | POA: Diagnosis present

## 2023-09-05 DIAGNOSIS — W010XXA Fall on same level from slipping, tripping and stumbling without subsequent striking against object, initial encounter: Secondary | ICD-10-CM | POA: Diagnosis present

## 2023-09-05 DIAGNOSIS — I350 Nonrheumatic aortic (valve) stenosis: Secondary | ICD-10-CM | POA: Diagnosis present

## 2023-09-05 DIAGNOSIS — M6282 Rhabdomyolysis: Secondary | ICD-10-CM | POA: Diagnosis present

## 2023-09-05 DIAGNOSIS — K7460 Unspecified cirrhosis of liver: Secondary | ICD-10-CM | POA: Diagnosis present

## 2023-09-05 DIAGNOSIS — B351 Tinea unguium: Secondary | ICD-10-CM | POA: Diagnosis present

## 2023-09-05 DIAGNOSIS — E785 Hyperlipidemia, unspecified: Secondary | ICD-10-CM | POA: Diagnosis present

## 2023-09-05 DIAGNOSIS — K7581 Nonalcoholic steatohepatitis (NASH): Secondary | ICD-10-CM | POA: Diagnosis present

## 2023-09-05 DIAGNOSIS — L03311 Cellulitis of abdominal wall: Secondary | ICD-10-CM | POA: Diagnosis present

## 2023-09-05 DIAGNOSIS — I272 Pulmonary hypertension, unspecified: Secondary | ICD-10-CM | POA: Diagnosis present

## 2023-09-05 DIAGNOSIS — J9811 Atelectasis: Secondary | ICD-10-CM | POA: Diagnosis present

## 2023-09-05 DIAGNOSIS — R531 Weakness: Secondary | ICD-10-CM | POA: Diagnosis present

## 2023-09-05 DIAGNOSIS — I7121 Aneurysm of the ascending aorta, without rupture: Secondary | ICD-10-CM | POA: Diagnosis present

## 2023-09-05 LAB — FOLATE: Folate: 13.4 ng/mL (ref >5.9)

## 2023-09-05 LAB — CBC
HCT: 38.5 % — ABNORMAL LOW (ref 39.0–52.0)
Hemoglobin: 11.9 g/dL — ABNORMAL LOW (ref 13.0–17.0)
MCH: 27.3 pg (ref 26.0–34.0)
MCHC: 30.9 g/dL (ref 30.0–36.0)
MCV: 88.3 fL (ref 80.0–100.0)
Platelets: 250 10*3/uL (ref 150–400)
RBC: 4.36 MIL/uL (ref 4.22–5.81)
RDW: 15.9 % — ABNORMAL HIGH (ref 11.5–15.5)
WBC: 22.6 10*3/uL — ABNORMAL HIGH (ref 4.0–10.5)
nRBC: 0.1 % (ref 0.0–0.2)

## 2023-09-05 LAB — BLOOD GAS, VENOUS
Acid-Base Excess: 4.5 mmol/L — ABNORMAL HIGH (ref 0.0–2.0)
Bicarbonate: 31.2 mmol/L — ABNORMAL HIGH (ref 20.0–28.0)
O2 Saturation: 32.1 %
Patient temperature: 37.2
pCO2, Ven: 55 mmHg (ref 44–60)
pH, Ven: 7.37 (ref 7.25–7.43)
pO2, Ven: <31 mmHg — CL (ref 32–45)

## 2023-09-05 LAB — PHOSPHORUS
Phosphorus: 2.8 mg/dL (ref 2.5–4.6)
Phosphorus: 2.7 mg/dL (ref 2.5–4.6)

## 2023-09-05 LAB — RETICULOCYTES
Immature Retic Fract: 15.2 % (ref 2.3–15.9)
RBC.: 4.33 MIL/uL (ref 4.22–5.81)
Retic Count, Absolute: 57.6 10*3/uL (ref 19.0–186.0)
Retic Ct Pct: 1.3 % (ref 0.4–3.1)

## 2023-09-05 LAB — COMPREHENSIVE METABOLIC PANEL WITH GFR
ALT: 51 U/L — ABNORMAL HIGH (ref 0–44)
AST: 84 U/L — ABNORMAL HIGH (ref 15–41)
Albumin: 3.2 g/dL — ABNORMAL LOW (ref 3.5–5.0)
Alkaline Phosphatase: 71 U/L (ref 38–126)
Anion gap: 14 (ref 5–15)
BUN: 23 mg/dL (ref 8–23)
CO2: 22 mmol/L (ref 22–32)
Calcium: 8.9 mg/dL (ref 8.9–10.3)
Chloride: 101 mmol/L (ref 98–111)
Creatinine, Ser: 1.29 mg/dL — ABNORMAL HIGH (ref 0.61–1.24)
GFR, Estimated: 57 mL/min — ABNORMAL LOW (ref >60)
Glucose, Bld: 120 mg/dL — ABNORMAL HIGH (ref 70–99)
Potassium: 4.3 mmol/L (ref 3.5–5.1)
Sodium: 137 mmol/L (ref 135–145)
Total Bilirubin: 1.6 mg/dL — ABNORMAL HIGH (ref 0.0–1.2)
Total Protein: 7.7 g/dL (ref 6.5–8.1)

## 2023-09-05 LAB — PROCALCITONIN: Procalcitonin: 1.07 ng/mL

## 2023-09-05 LAB — CK: Total CK: 990 U/L — ABNORMAL HIGH (ref 49–397)

## 2023-09-05 LAB — IRON AND TIBC
Iron: 37 ug/dL — ABNORMAL LOW (ref 45–182)
Saturation Ratios: 12 % — ABNORMAL LOW (ref 17.9–39.5)
TIBC: 307 ug/dL (ref 250–450)
UIBC: 270 ug/dL

## 2023-09-05 LAB — MAGNESIUM
Magnesium: 1.6 mg/dL — ABNORMAL LOW (ref 1.7–2.4)
Magnesium: 1.7 mg/dL (ref 1.7–2.4)

## 2023-09-05 LAB — VITAMIN B12: Vitamin B-12: 757 pg/mL (ref 180–914)

## 2023-09-05 LAB — TSH: TSH: 0.411 u[IU]/mL (ref 0.350–4.500)

## 2023-09-05 LAB — OSMOLALITY: Osmolality: 299 mosm/kg — ABNORMAL HIGH (ref 275–295)

## 2023-09-05 LAB — FERRITIN: Ferritin: 233 ng/mL (ref 24–336)

## 2023-09-05 LAB — PREALBUMIN: Prealbumin: 14 mg/dL — ABNORMAL LOW (ref 18–38)

## 2023-09-05 MED ORDER — ORAL CARE MOUTH RINSE: 15.0000 mL | OROMUCOSAL | Status: DC | PRN

## 2023-09-05 MED ORDER — ENOXAPARIN SODIUM 40 MG/0.4ML IJ SOSY
40.0000 mg | PREFILLED_SYRINGE | INTRAMUSCULAR | Status: DC
  Administered 2023-09-05 – 2023-09-06 (×2): 40 mg via SUBCUTANEOUS
  Filled 2023-09-05 (×2): qty 0.4

## 2023-09-05 MED ORDER — MAGNESIUM SULFATE 4 GM/100ML IV SOLN
4.0000 g | Freq: Once | INTRAVENOUS | Status: AC
  Administered 2023-09-05: 4 g via INTRAVENOUS
  Filled 2023-09-05: qty 100

## 2023-09-05 MED ORDER — FAMOTIDINE 20 MG PO TABS
10.0000 mg | ORAL_TABLET | Freq: Every day | ORAL | Status: DC
  Administered 2023-09-05 – 2023-09-13 (×9): 10 mg via ORAL
  Filled 2023-09-05 (×9): qty 1

## 2023-09-05 MED ORDER — SODIUM CHLORIDE 0.9 % IV SOLN: INTRAVENOUS | Status: AC

## 2023-09-05 MED ORDER — SODIUM CHLORIDE 0.9 % IV SOLN
2.0000 g | Freq: Three times a day (TID) | INTRAVENOUS | Status: DC
  Administered 2023-09-05 – 2023-09-06 (×3): 2 g via INTRAVENOUS
  Filled 2023-09-05 (×3): qty 12.5

## 2023-09-05 NOTE — Plan of Care (Signed)
  Problem: Clinical Measurements: Goal: Diagnostic test results will improve Outcome: Progressing   Problem: Coping: Goal: Level of anxiety will decrease Outcome: Progressing   Problem: Safety: Goal: Ability to remain free from injury will improve Outcome: Progressing   

## 2023-09-05 NOTE — Care Management Obs Status (Signed)
 MEDICARE OBSERVATION STATUS NOTIFICATION   Patient Details  Name: Curtis Hill MRN: [id number] Date of Birth: 1945-10-19   Medicare Observation Status Notification Given:  Yes    Omie Bickers, RN 09/05/2023, 2:31 PM

## 2023-09-05 NOTE — Progress Notes (Signed)
 PROGRESS NOTE    KYREN KNICK  NUU:[id number] DOB: 07-22-1945 DOA: 09/04/2023 PCP: Jimmey Mould, MD   Brief Narrative: Curtis Hill is a 78 y.o. male with a history of morbid obesity, hypertension, hyperlipidemia, aortic aneurysm.  Patient presented secondary to a fall with associated generalized weakness and found to have evidence of sepsis on admission presumed secondary to panniculitis. Empiric antibiotics started.   Assessment and Plan:  Sepsis Present on admission. Secondary to cellulitis. Fevers with leukocytosis. Blood cultures obtained on admission. Empiric Vancomycin , Cefepime and Flagyl. -Continue Vancomycin  and Cefepime -Discontinue Flagyl  Panniculitis Cellulitis Noted clinically and on CT imaging. Patient started empirically on antibiotics, in addition to nystatin. Wound care consulted. -Continue Vancomycin , Cefepime; discontinue Flagyl -Continue nystatin  AKI Baseline creatinine from 2023 of around 1. Creatinine of 1.43 on admission. Improvement to 1.29. -BMP in AM  Primary hypertension Patient is on valsartan as an outpatient which was held on admission  Hyperlipidemia -Continue Lipitor  Iron deficiency anemia Mild anemia. Stable.  Ascending aortic aneurysm Measuring 45 mm, up from 42 mm one year prior. Recommendation for semi-annual imaging with CTA or MRA in addition to cardiothoracic surgery referral.  Obesity, class III Estimated body mass index is 78.31 kg/m as calculated from the following:   Height as of this encounter: 5\' 7"  (1.702 m).   Weight as of this encounter: 226.8 kg.   DVT prophylaxis: Lovenox Code Status:   Code Status: Full Code Family Communication: None Disposition Plan: Discharge pending ability to transition to outpatient antibiotic regimen   Consultants:  None  Procedures:  None  Antimicrobials: Vancomycin  Cefepime Flagyl    Subjective: Patient reports some word finding difficulties. No other concerns  or issues.  Objective: BP (!) 147/93 (BP Location: Left Wrist)   Pulse (!) 117   Temp 99.9 F (37.7 C) (Oral)   Resp (!) 24   Ht 5\' 7"  (1.702 m)   Wt (!) 226.8 kg   SpO2 94%   BMI 78.31 kg/m   Examination:  General exam: Appears calm and comfortable. Severely morbidly obese Respiratory system: Clear to auscultation. Respiratory effort normal. Cardiovascular system: S1 & S2 heard, RRR. 2/6 systolic murmur. Gastrointestinal system: Large panus. Lower abdomen panus with chronic dermatitis changes. Normal bowel sounds heard. Central nervous system: Alert and oriented. No focal neurological deficits. Musculoskeletal: BLE edema. Psychiatry: Judgement and insight appear normal. Mood & affect appropriate.    Data Reviewed: I have personally reviewed following labs and imaging studies  CBC Lab Results  Component Value Date   WBC 22.6 (H) 09/05/2023   RBC 4.33 09/05/2023   RBC 4.36 09/05/2023   HGB 11.9 (L) 09/05/2023   HCT 38.5 (L) 09/05/2023   MCV 88.3 09/05/2023   MCH 27.3 09/05/2023   PLT 250 09/05/2023   MCHC 30.9 09/05/2023   RDW 15.9 (H) 09/05/2023   LYMPHSABS 0.6 (L) 09/04/2023   MONOABS 1.4 (H) 09/04/2023   EOSABS 0.8 (H) 09/04/2023   BASOSABS 0.0 09/04/2023     Last metabolic panel Lab Results  Component Value Date   NA 136 09/04/2023   K 4.1 09/04/2023   CL 100 09/04/2023   CO2 28 09/04/2023   BUN 22 09/04/2023   CREATININE 1.43 (H) 09/04/2023   GLUCOSE 117 (H) 09/04/2023   GFRNONAA 50 (L) 09/04/2023   GFRAA 89 05/23/2020   CALCIUM  9.0 09/04/2023   PHOS 2.8 09/05/2023   PROT 8.0 09/04/2023   ALBUMIN 3.5 09/04/2023   LABGLOB 2.9 05/23/2020  AGRATIO 1.5 05/23/2020   BILITOT 0.9 09/04/2023   ALKPHOS 71 09/04/2023   AST 39 09/04/2023   ALT 29 09/04/2023   ANIONGAP 8 09/04/2023    GFR: Estimated Creatinine Clearance: 79.8 mL/min (A) (by C-G formula based on SCr of 1.43 mg/dL (H)).  Recent Results (from the past 240 hours)  Blood Culture  (routine x 2)     Status: None (Preliminary result)   Collection Time: 09/04/23  5:15 PM   Specimen: BLOOD  Result Value Ref Range Status   Specimen Description BLOOD SITE NOT SPECIFIED  Final   Special Requests   Final    BOTTLES DRAWN AEROBIC AND ANAEROBIC Blood Culture results may not be optimal due to an inadequate volume of blood received in culture bottles   Culture   Final    NO GROWTH < 24 HOURS Performed at [facility], [address], [place], [place] [address]    Report Status PENDING  Incomplete  Resp panel by RT-PCR (RSV, Flu A&B, Covid) Anterior Nasal Swab     Status: None   Collection Time: 09/04/23  6:48 PM   Specimen: Anterior Nasal Swab  Result Value Ref Range Status   SARS Coronavirus 2 by RT PCR NEGATIVE NEGATIVE Final   Influenza A by PCR NEGATIVE NEGATIVE Final   Influenza B by PCR NEGATIVE NEGATIVE Final    Comment: (NOTE) The Xpert Xpress SARS-CoV-2/FLU/RSV plus assay is intended as an aid in the diagnosis of influenza from Nasopharyngeal swab specimens and should not be used as a sole basis for treatment. Nasal washings and aspirates are unacceptable for Xpert Xpress SARS-CoV-2/FLU/RSV testing.  Fact Sheet for Patients: BloggerCourse.com  Fact Sheet for Healthcare Providers: SeriousBroker.it  This test is not yet approved or cleared by the [place]  FDA and has been authorized for detection and/or diagnosis of SARS-CoV-2 by FDA under an Emergency Use Authorization (EUA). This EUA will remain in effect (meaning this test can be used) for the duration of the COVID-19 declaration under Section 564(b)(1) of the Act, 21 U.S.C. section 360bbb-3(b)(1), unless the authorization is terminated or revoked.     Resp Syncytial Virus by PCR NEGATIVE NEGATIVE Final    Comment: (NOTE) Fact Sheet for Patients: BloggerCourse.com  Fact Sheet for Healthcare  Providers: SeriousBroker.it  This test is not yet approved or cleared by the [place]  FDA and has been authorized for detection and/or diagnosis of SARS-CoV-2 by FDA under an Emergency Use Authorization (EUA). This EUA will remain in effect (meaning this test can be used) for the duration of the COVID-19 declaration under Section 564(b)(1) of the Act, 21 U.S.C. section 360bbb-3(b)(1), unless the authorization is terminated or revoked.  Performed at [facility], [address] [place], [place] [address]   MRSA Next Gen by PCR, Nasal     Status: None   Collection Time: 09/04/23  9:50 PM   Specimen: Nasal Mucosa; Nasal Swab  Result Value Ref Range Status   MRSA by PCR Next Gen NOT DETECTED NOT DETECTED Final    Comment: (NOTE) The GeneXpert MRSA Assay (FDA approved for NASAL specimens only), is one component of a comprehensive MRSA colonization surveillance program. It is not intended to diagnose MRSA infection nor to guide or monitor treatment for MRSA infections. Test performance is not FDA approved in patients less than 23 years old. Performed at [facility], [address], [place], [place] [address]       Radiology Studies: CT CHEST ABDOMEN  PELVIS WO CONTRAST Result Date: 09/05/2023 CLINICAL DATA:  Systemic inflammatory response syndrome. Possible sepsis. EXAM: CT CHEST, ABDOMEN AND PELVIS WITHOUT CONTRAST TECHNIQUE: Multidetector CT imaging of the chest, abdomen and pelvis was performed following the standard protocol without IV contrast. RADIATION DOSE REDUCTION: This exam was performed according to the departmental dose-optimization program which includes automated exposure control, adjustment of the mA and/or kV according to patient size and/or use of iterative reconstruction technique. COMPARISON:  Abdominal radiograph 09/04/2023; CT chest 06/22/2022; CT abdomen pelvis 05/18/2017; chest radiograph 09/04/2023 FINDINGS: CT CHEST  FINDINGS Cardiovascular: No pericardial effusion. Coronary artery and aortic atherosclerotic calcification. The ascending aorta is dilated measuring 45 mm in diameter. This is increased compared to 06/22/2022 when it measured 42 mm using similar measuring technique. Mediastinum/Nodes: Trachea and esophagus are unremarkable. No thoracic adenopathy. Lungs/Pleura: No focal consolidation, pleural effusion, or pneumothorax. Musculoskeletal: No acute fracture. CT ABDOMEN PELVIS FINDINGS Hepatobiliary: Mild nodularity of the hepatic contour with hypertrophy of the caudate lobe. Decompressed gallbladder. No biliary dilation. Pancreas: Fatty atrophy.  No acute abnormality. Spleen: Unremarkable. Adrenals/Urinary Tract: Normal adrenal glands. Intermediate density exophytic cystic lesion off the posterior right kidney measuring 1.7 cm. This likely represents a proteinaceous or hemorrhagic cyst and has decreased in size from 2019 when it measured 2.7 cm. Given decrease in size, no follow-up is recommended. No urinary calculi or hydronephrosis. Unremarkable bladder. Stomach/Bowel: Normal caliber large and small bowel. Extensive diverticulosis of the sigmoid colon without diverticulitis. Stomach is within normal limits. Vascular/Lymphatic: Aortic atherosclerosis. No enlarged abdominal or pelvic lymph nodes. Reproductive: Metallic seeds in the prostate. Other: No free intraperitoneal fluid or air. Musculoskeletal: No acute fracture. Partially visualized soft tissue thickening, free fluid, and fat stranding in the lower abdominal wall pannus. No abscess. IMPRESSION: 1. Partially visualized soft tissue thickening, free fluid, and fat stranding in the lower abdominal wall pannus. No abscess. Correlate for panniculitis/cellulitis. 2. Similar findings of cirrhosis. 3. Ascending aortic aneurysm measuring 45 mm in diameter, increased from 06/22/2022 when it measured 42 mm. Ascending thoracic aortic aneurysm. Recommend semi-annual imaging  followup by CTA or MRA and referral to cardiothoracic surgery if not already obtained. This recommendation follows 2010 ACCF/AHA/AATS/ACR/ASA/SCA/SCAI/SIR/STS/SVM Guidelines for the Diagnosis and Management of Patients With Thoracic Aortic Disease. Circulation. 2010; 121: A540-J811. Aortic aneurysm NOS (ICD10-I71.9) 4. Aortic Atherosclerosis (ICD10-I70.0). Electronically Signed   By: Rozell Cornet M.D.   On: 09/05/2023 01:40   DG Abd 1 View Result Date: 09/04/2023 CLINICAL DATA:  [id number] SIRS (systemic inflammatory response syndrome) (HCC) [id number] EXAM: ABDOMEN - 1 VIEW COMPARISON:  CT abdomen pelvis 05/18/2017 FINDINGS: The bowel gas pattern is normal. Radiation seeds overlie the expected region of the prostate. No radio-opaque calculi or other significant radiographic abnormality are seen. IMPRESSION: Nonobstructive bowel gas pattern. Electronically Signed   By: Morgane  Naveau M.D.   On: 09/04/2023 22:41   DG Chest Port 1 View Result Date: 09/04/2023 CLINICAL DATA:  Possible sepsis. EXAM: PORTABLE CHEST 1 VIEW COMPARISON:  10/25/2016. FINDINGS: Evaluation is limited due to patient's body habitus and rotation. The heart is enlarged. Evaluation of the mediastinal structures is limited. The tracheal column appears patent. Atelectasis is noted at the left lung base. There is a small right pleural effusion with atelectasis or infiltrate. No pneumothorax is seen. No acute osseous abnormality. IMPRESSION: 1. Small right pleural effusion with atelectasis or infiltrate. 2. Left basilar atelectasis. 3. Cardiomegaly. Electronically Signed   By: Wyvonnia Heimlich M.D.   On: 09/04/2023 18:06      LOS:  0 days    Aneita Keens, MD [facility] 09/05/2023, 9:45 AM   If 7PM-7AM, please contact night-coverage [email or web address]

## 2023-09-05 NOTE — TOC Initial Note (Addendum)
 Transition of Care [facility]) - Initial/Assessment Note    Patient Details  Name: Curtis Hill MRN: [id number] Date of Birth: 02/20/1946  Transition of Care [facility]) CM/SW Contact:    Omie Bickers, RN Phone Number: 09/05/2023, 3:06 PM  Clinical Narrative:                  Eldora Greet w patient and his son at bedside. Patient lives at home w wife and son. They have needed DME at home and have an electric scooter being made that should be done in the next 60-90 days per the son.  Patient states that he is active w home health but I was unable to see who in [place].  Explained [place] letter, and both understand that he would not be eligible to go to SNF through his Medicare benefits while in observation, unless he has a [facility] waiver, but he stated he wanted to go home    Barriers to Discharge: Continued Medical Work up   Patient Goals and CMS Choice Patient states their goals for this hospitalization and ongoing recovery are:: to go home          Expected Discharge Plan and Services   Discharge Planning Services: CM Consult                                          Prior Living Arrangements/Services   Lives with:: Spouse, Adult Children                   Activities of Daily Living      Permission Sought/Granted                  Emotional Assessment              Admission diagnosis:  SIRS (systemic inflammatory response syndrome) (HCC) [R65.10] Sepsis without acute organ dysfunction, due to unspecified organism [facility]) [A41.9] Patient Active Problem List   Diagnosis Date Noted   SIRS (systemic inflammatory response syndrome) (HCC) 09/04/2023   AKI (acute kidney injury) (HCC) 09/04/2023   OSA (obstructive sleep apnea) 09/04/2023   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   Peripheral edema 12/07/2020   B12 deficiency 11/14/2020   Essential hypertension 08/15/2020   Visceral obesity 07/29/2020   Prediabetes 06/20/2020   Vitamin D  deficiency 05/23/2020   Hyperglycemia 05/23/2020   Shortness of breath on exertion 05/23/2020   Debility 05/23/2020   Nutrition impaired due to imbalance of nutrients 05/23/2020   Depression screening 05/23/2020   Thoracic ascending aortic aneurysm (HCC) 11/22/2019   Panniculitis 10/31/2019   History of colonic polyps 02/27/2019   Pulmonary HTN (HCC) 11/02/2018   CAD (coronary artery disease) 10/20/2018   Aortic stenosis 07/19/2018   Hyperlipidemia LDL goal <70 07/19/2018   Hyperlipidemia    Coronary artery calcification seen on CAT scan 03/17/2018   Benign essential HTN 03/17/2018   Thoracic aortic aneurysm (TAA) (HCC) 03/17/2018   Benign neoplasm of colon 03/28/2012   BMI 70 and over, adult (HCC) 05/18/2007   ANEMIA, IRON DEFICIENCY 05/18/2007   Leukocytosis 05/18/2007   Disease of blood and blood forming organ 05/18/2007   CARDIOMEGALY 05/18/2007   SINUSITIS, ACUTE 05/18/2007   Diaphragmatic hernia 05/18/2007   Abdominal pain 05/18/2007   EPIGASTRIC PAIN 05/18/2007   GOUT, HX OF 05/18/2007   PCP:  Jimmey Mould, MD Pharmacy:   [facility]  [id number], [place] - [address] [place] [address] Phone: [phone] Fax: [phone]  [facility] [address] [place] [address] Phone: [phone] Fax: [phone]     Social Drivers of Health (SDOH) Social History: SDOH Screenings   Depression (PHQ2-9): Medium Risk (05/23/2020)  Tobacco Use: Low Risk  (09/04/2023)   SDOH Interventions:     Readmission Risk Interventions     No data to display

## 2023-09-05 NOTE — Hospital Course (Signed)
 Curtis Hill is a 78 y.o. male with a history of morbid obesity, hypertension, hyperlipidemia, aortic aneurysm.  Patient presented secondary to a fall with associated generalized weakness and found to have evidence of sepsis on admission presumed secondary to panniculitis. Empiric antibiotics started.

## 2023-09-05 NOTE — Evaluation (Signed)
 Physical Therapy Evaluation Patient Details Name: Curtis Hill MRN: [id number] DOB: 1945-10-17 Today's Date: 09/05/2023  History of Present Illness  Pt is a 78 yo male who presented to [facility] from home following fall when attempting to get up from toilet with pt requiring EMS assist. EMS found pt to be tachycardic and febrile. Pt with recent treatment for UTI and R foot ulcer. PMH: HTN, morbid obesity, OSA, thoracic aortic aneurysm, aortic stenosis, pulmonary hypertension  Clinical Impression  PTA pt living on first floor of multistory home with ramped entrance. Pt reports sleeping in Lift char and being able to ambulate in his home with RW (power wheelchair has been ordered). Pt performs his own bathing and dressing.  Pt is limited in safe mobility today by pain and decreased ROM in LE, especially R foot, due to pressure of increased pannus in supine. PT/OT provided totalAx2 for rolling and positioning of pannus in more neutral position. Pt unaware of level of intertriginous dermatitis in pannus folds, increased odor with movement of folds. Patient will benefit from continued inpatient follow up therapy, <3 hours/day. PT will continue to follow acutely.        If plan is discharge home, recommend the following: Two people to help with walking and/or transfers;Two people to help with bathing/dressing/bathroom;Assistance with cooking/housework;Direct supervision/assist for medications management;Direct supervision/assist for financial management;Assist for transportation;Help with stairs or ramp for entrance   Can travel by private vehicle   No    Equipment Recommendations None recommended by PT  Recommendations for Other Services       Functional Status Assessment Patient has had a recent decline in their functional status and demonstrates the ability to make significant improvements in function in a reasonable and predictable amount of time.     Precautions / Restrictions  Precautions Precautions: Fall Restrictions Weight Bearing Restrictions Per Provider Order: No      Mobility  Bed Mobility Overal bed mobility: Needs Assistance Bed Mobility: Rolling Rolling: Total assist, +2 for physical assistance, +2 for safety/equipment, Used rails         General bed mobility comments: Pt largely requiring Total assist +2 with pt requiring cues for sequencing and hand placement/technique with attempts to assist with rolling in the bed, but with pt unable to providing meaningful assist this session.    Transfers Overall transfer level: Needs assistance                 General transfer comment: deferred this session for pt/therapist safety           Pertinent Vitals/Pain Pain Assessment Pain Assessment: Faces Faces Pain Scale: Hurts whole lot Pain Location: R LE (worse in foot), pannus Pain Descriptors / Indicators: Aching, Discomfort, Grimacing, Sore, Tightness Pain Intervention(s): Limited activity within patient's tolerance, Monitored during session, Repositioned    Home Living Family/patient expects to be discharged to:: Private residence Living Arrangements: Spouse/significant other;Children (son) Available Help at Discharge: Family;Available 24 hours/day Type of Home: House Home Access: Stairs to enter;Ramped entrance (Flexsteps- steps convert to ramp) Entrance Stairs-Rails: Right;Left;Can reach both Entrance Stairs-Number of Steps: 4 Alternate Level Stairs-Number of Steps: flight Home Layout: Two level;Able to live on main level with bedroom/bathroom (son lives in finished basement; pt and his wife live on main floor) Home Equipment: Agricultural consultant (2 wheels);Lift chair (bariatric RW; power wheelchair has been ordered) Additional Comments: pt reports he sleeps in his lift chair; pt reports he does not use a shower chair due to not being able to fit  in shower if a chair is in the shower; uncertain how much family is able to assist as pt  reports his wife uses a walker and a shower chair; has a dog    Prior Function Prior Level of Function : Independent/Modified Independent             Mobility Comments: Pt reports he was Mod I for funcitonal mobility with a bariatric RW. Pt with fall in bathroom requiring EMS assist and leading to this admission. ADLs Comments: Pt reports he was Independent to Mod I with ADLs and that he and family all assist with IADLs. However, uncertain of accuracy of pt report as pt with noted skin breakdown with areas covered in pus in skin folds at Right side of pannus and skin redness and irritation in skin folds on Left side of pannus and around groin, B feet, B calves, and on Left upper arm, as well. Pt reports he does not drive.     Extremity/Trunk Assessment   Upper Extremity Assessment Upper Extremity Assessment: Defer to OT evaluation RUE Deficits / Details: AROM shoulder flexion to approx. 100 degrees; generalized weakness; edematous throughout LUE Deficits / Details: AROM shoulder flexion to approx. 110 degrees; generalized weakness; skin breakdown and fluid filled blister on dorsal side of upper arm with RN aware; edematous throughout    Lower Extremity Assessment Lower Extremity Assessment: RLE deficits/detail;LLE deficits/detail RLE Deficits / Details: R foot with increased rubor and +3 pitting edema, pt reports increased pain, symptoms likely due to pressure of pannus on blood supply to his LE, pain decreases when pannus is shifted off R LE, R great toe noted to be punctured, ROM limited by body habitus, strength grossly 3/5 RLE Sensation: decreased light touch RLE Coordination: decreased fine motor;decreased gross motor (limited by body habitus) LLE Deficits / Details: ROM limited by body habitus, strength grossly 3/5 LLE Sensation: decreased light touch LLE Coordination: decreased fine motor;decreased gross motor (decreased due to body habitus)    Cervical / Trunk  Assessment Cervical / Trunk Assessment: Other exceptions Cervical / Trunk Exceptions: increased body habitus; noted redness in fold of pannus and around genital area with skin breakdown and pus noted in skin folds of pannus on Right side - RN and MD notified.  Communication   Communication Communication: No apparent difficulties    Cognition Arousal: Alert Behavior During Therapy: WFL for tasks assessed/performed   PT - Cognitive impairments: Problem solving, Safety/Judgement                       PT - Cognition Comments: poor understanding of gravity of his condition, unable to self determine that his pannus is reducing blood flow to his foot, only that he has extreme pain in his R foot Following commands: Impaired Following commands impaired: Only follows one step commands consistently, Follows one step commands inconsistently, Follows multi-step commands inconsistently, Follows multi-step commands with increased time     Cueing Cueing Techniques: Verbal cues, Gestural cues, Tactile cues, Visual cues     General Comments General comments (skin integrity, edema, etc.): Pt with HR in the low-100s to 110s with noted V-tach. Pt O2 sat largely >/92% on RA but with one brief drop to 88% on RA with O2 sat quickly recovering to >/92%. PT respiration rate latgely 17 to 22 with elevation to 27 when in supine being repositioned in bed.        Assessment/Plan    PT Assessment Patient needs continued PT  services  PT Problem List Decreased strength;Decreased range of motion;Decreased activity tolerance;Decreased balance;Decreased mobility;Decreased coordination;Decreased cognition;Impaired sensation;Decreased skin integrity;Pain;Decreased safety awareness       PT Treatment Interventions DME instruction;Gait training;Functional mobility training;Therapeutic activities;Therapeutic exercise;Balance training;Cognitive remediation;Patient/family education    PT Goals (Current goals can  be found in the Care Plan section)  Acute Rehab PT Goals PT Goal Formulation: With patient Time For Goal Achievement: 09/19/23 Potential to Achieve Goals: Poor    Frequency Min 2X/week     Co-evaluation   Reason for Co-Treatment: Complexity of the patient's impairments (multi-system involvement);To address functional/ADL transfers;For patient/therapist safety   OT goals addressed during session: ADL's and self-care;Strengthening/ROM       AM-PAC PT "6 Clicks" Mobility  Outcome Measure Help needed turning from your back to your side while in a flat bed without using bedrails?: Total Help needed moving from lying on your back to sitting on the side of a flat bed without using bedrails?: Total Help needed moving to and from a bed to a chair (including a wheelchair)?: Total Help needed standing up from a chair using your arms (e.g., wheelchair or bedside chair)?: Total Help needed to walk in hospital room?: Total Help needed climbing 3-5 steps with a railing? : Total 6 Click Score: 6    End of Session   Activity Tolerance: Patient limited by pain Patient left: in bed;with call bell/phone within reach Nurse Communication: Mobility status;Other (comment);Need for lift equipment (need to be moved to [facility] room for safety) PT Visit Diagnosis: Difficulty in walking, not elsewhere classified (R26.2);Adult, failure to thrive (R62.7);Pain Pain - Right/Left: Right Pain - part of body: Ankle and joints of foot    Time: 7829-5621 PT Time Calculation (min) (ACUTE ONLY): 60 min   Charges:   PT Evaluation $PT Eval Moderate Complexity: 1 Mod PT Treatments $Therapeutic Activity: 8-22 mins PT General Charges $$ ACUTE PT VISIT: 1 Visit         Kristiann Noyce B. Jewel Mortimer PT, DPT Acute Rehabilitation Services Please use secure chat or  Call Office [phone]   Verlie Glisson [facility] 09/05/2023, 1:55 PM

## 2023-09-05 NOTE — Progress Notes (Signed)
 MEWS Progress Note  Patient Details Name: Curtis Hill MRN: [id number] DOB: 08-29-1945 Today's Date: 09/05/2023   MEWS Flowsheet Documentation:  Assess: MEWS Score Temp: (!) 102.7 F (39.3 C) BP: (!) 126/93 MAP (mmHg): 104 Pulse Rate: (!) 108 ECG Heart Rate: (!) 107 Resp: (!) 22 Level of Consciousness: Alert SpO2: 95 % O2 Device: Room Air Patient Activity (if Appropriate): In bed Assess: MEWS Score MEWS Temp: 2 MEWS Systolic: 0 MEWS Pulse: 1 MEWS RR: 1 MEWS LOC: 0 MEWS Score: 4 MEWS Score Color: Red Assess: SIRS CRITERIA SIRS Temperature : 1 SIRS Respirations : 1 SIRS Pulse: 1 SIRS WBC: 0 SIRS Score Sum : 3 SIRS Temperature : 1 SIRS Pulse: 1 SIRS Respirations : 0 SIRS WBC: 0 SIRS Score Sum : 2 Assess: if the MEWS score is Yellow or Red Were vital signs accurate and taken at a resting state?: Yes Does the patient meet 2 or more of the SIRS criteria?: Yes Does the patient have a confirmed or suspected source of infection?: Yes MEWS guidelines implemented : Yes, red Treat MEWS Interventions: Considered administering scheduled or prn medications/treatments as ordered Take Vital Signs Increase Vital Sign Frequency : Red: Q1hr x2, continue Q4hrs until patient remains green for 12hrs Escalate MEWS: Escalate: Red: Discuss with charge nurse and notify provider. Consider notifying RRT. If remains red for 2 hours consider need for higher level of care Provider Notification Provider Name/Title: MD Duard Getting Date Provider Notified: 09/05/23 Time Provider Notified: [id number] Method of Notification:  (secure chat) Notification Reason: Change in status (Red Mews)   PRN tylenol  given for patient's fever.  Patient alert, oriented, and does not show any signs of distress at this time.     [name] 09/05/2023, 8:13 AM

## 2023-09-05 NOTE — Progress Notes (Signed)
 Pharmacy Antibiotic Note  Curtis Hill is a 78 y.o. male for which pharmacy has been consulted for cefepime and vancomycin  dosing for sepsis.  Patient with a history of obesity, HTN, HLD, aortic aneurysm. Patient presenting with weakness.  SCr improved to 1.29, Vancomycin  dose still appropriate WBC 22.6, Tmax 103.1 COVID neg / flu neg  Plan: Metronidazole per MD Cefepime 2g q8hr  Continue Vancomycin  2000 mg q24hr (eAUC 423.8, Vd 0.5) Monitor WBC, fever, renal function, cultures De-escalate when able Levels at steady state  Height: 5\' 7"  (170.2 cm) Weight: (!) 226.8 kg (500 lb) IBW/kg (Calculated) : 66.1  Temp (24hrs), Avg:101.2 F (38.4 C), Min:99 F (37.2 C), Max:103.1 F (39.5 C)  Recent Labs  Lab 09/04/23 1714 09/04/23 1725 09/05/23 0831  WBC 20.1*  --  22.6*  CREATININE 1.43*  --  1.29*  LATICACIDVEN  --  1.3  --     Estimated Creatinine Clearance: 88.4 mL/min (A) (by C-G formula based on SCr of 1.29 mg/dL (H)).    Allergies  Allergen Reactions   Levofloxacin Other (See Comments)    Aortic aneurysm   Lisinopril Anaphylaxis, Shortness Of Breath, Swelling and Rash   Prednisone  Swelling   Microbiology results: 5/24 Bcx: ngtd < 24 hrs 5/24 MRSA PCR: negative  Thank you for allowing pharmacy to be a part of this patient's care.   Valarie Garner, PharmD PGY1 Pharmacy Resident  Please check AMION for all [facility] Pharmacy phone numbers After 10:00 PM, call Main Pharmacy [phone] 09/05/23 1:35 PM

## 2023-09-05 NOTE — Progress Notes (Signed)
   09/05/23 2100  BiPAP/CPAP/SIPAP  Reason BIPAP/CPAP not in use Non-compliant (pt refused at this time. will call if changes his mind.)  BiPAP/CPAP /SiPAP Vitals  Pulse Rate 97  Resp 20  Bilateral Breath Sounds Clear;Diminished

## 2023-09-05 NOTE — Consult Note (Signed)
 WOC Nurse Consult Note: Reason for Consult: skin breakdown under pannus  Wound type: Intertriginous dermatitis  ICD-10 CM Codes for Irritant Dermatitis  L30.4  - Erythema intertrigo. Also used for abrasion of the hand, chafing of the skin, dermatitis due to sweating and friction, friction dermatitis, friction eczema, and genital/thigh intertrigo.  Pressure Injury POA NA not related to pressure  Measurement: widespread underneath pannus/abdominal folds  Wound bed: erythema with scattered partial thickness skin loss, per MD H&P fungal component and nystatin powder in use  Drainage (amount, consistency, odor) see nursing flowsheet  Periwound: erythema  Dressing procedure/placement/frequency: Cleanse underneath pannus/abdominal folds with Vashe wound cleanser [employer] [phone]), do not rinse and allow to air dry.  Apply Nystatin powder as prescribed by MD then apply Interdry AG as follows: Order [employer] Foot # [id number] Measure and cut length of InterDry to fit in skin folds that have skin breakdown Tuck InterDry fabric into skin folds in a single layer, allow for 2 inches of overhang from skin edges to allow for wicking to occur May remove to bathe; dry area thoroughly and then tuck into affected areas again Do not apply any creams or ointments when using InterDry DO NOT THROW AWAY FOR 5 DAYS unless soiled with stool DO NOT [facility] product, this will inactivate the silver in the material  New sheet of Interdry should be applied after 5 days of use if patient continues to have skin breakdown    POC discussed with bedside nurse. WOC team will not follow. Re-consult if further needs arise.   Thank you,    Ronni Colace MSN, RN-BC, [employer] [phone]

## 2023-09-05 NOTE — Evaluation (Signed)
 Occupational Therapy Evaluation Patient Details Name: Curtis Hill MRN: [id number] DOB: 26-Oct-1945 Today's Date: 09/05/2023   History of Present Illness   Pt is a 78 yo male who presented to [facility] from home following fall when attempting to get up from toilet with pt requiring EMS assist. EMS found pt to be tachycardic and febrile. Pt with recent treatment for UTI and R foot ulcer. PMH: HTN, morbid obesity, OSA, thoracic aortic aneurysm, aortic stenosis, pulmonary hypertension     Clinical Impressions At baseline, pt is Independent to Mod I with ADLs and Mod I with a bariatric RW household distances. Pt reports he, his wife, and his son all assist with IADLs in the house. Pt does not drive. Pt reports he sleeps in a lift chair and reports a power wheelchair has been ordered for him. Pt now presents with decreased activity tolerance, generalized B UE weakness, decreased B shoulder AROM, impaired cognition, pain affecting functional level, and decreased safety and independence with functional tasks. Pt currently demonstrates ability to complete UB ADLs Set up/Supervision to Max assist +1 to +2, LB ADLs with Total assist +2, and rolling L/R in bed with Total assist +2. Pt currently requiring cues for sequencing, safety, and compensatory techniques. During session, it was noted pt has redness in folds of pannus and around genital area with skin breakdown and pus noted in skin folds of pannus on Right side with RN and MD notified. Pt with HR in the low-100s to 110s with noted V-tach. Pt O2 sat largely >/92% on RA but with one brief drop to 88% on RA with O2 sat quickly recovering to >/92%. PT respiration rate latgely 17 to 22 with elevation to 27 when in supine being repositioned in bed. Pt will benefit from acute skilled OT services to address deficits outlined below, decrease caregiver burden, and increase safety and independence with functional tasks. Post acute discharge, pt will benefit from intensive  inpatient skilled rehab services < 3 hours per day to maximize rehab potential and decrease caregiver burden.      If plan is discharge home, recommend the following:   Two people to help with walking and/or transfers;Two people to help with bathing/dressing/bathroom;Assistance with cooking/housework;Assist for transportation;Help with stairs or ramp for entrance     Functional Status Assessment   Patient has had a recent decline in their functional status and demonstrates the ability to make significant improvements in function in a reasonable and predictable amount of time.     Equipment Recommendations   Hospital bed;Hoyer lift;Wheelchair cushion (measurements OT);Wheelchair (measurements OT);BSC/3in1 (low air loss mattress; requires bariatric equipment)     Recommendations for Other Services         Precautions/Restrictions   Precautions Precautions: Fall Restrictions Weight Bearing Restrictions Per Provider Order: No     Mobility Bed Mobility Overal bed mobility: Needs Assistance Bed Mobility: Rolling Rolling: Total assist, +2 for physical assistance, +2 for safety/equipment, Used rails         General bed mobility comments: Pt largely requiring Total assist +2 with pt requiring cues for sequencing and hand placement/technique with attempts to assist with rolling in the bed, but with pt unable to providing meaningful assist this session.    Transfers Overall transfer level: Needs assistance                 General transfer comment: deferred this session for pt/therapist safety      Balance  ADL either performed or assessed with clinical judgement   ADL Overall ADL's : Needs assistance/impaired Eating/Feeding: NPO   Grooming: Set up;Supervision/safety;Contact guard assist;Bed level (with HOB elevated)   Upper Body Bathing: Maximal assistance;Bed level;Cueing for sequencing;Cueing  for compensatory techniques (+2 assist to wash/dry back)   Lower Body Bathing: Total assistance;+2 for physical assistance;+2 for safety/equipment;Bed level;Cueing for sequencing;Cueing for compensatory techniques (cues for assisting with bed mobility)   Upper Body Dressing : Moderate assistance;Maximal assistance;Bed level Upper Body Dressing Details (indicate cue type and reason): to donn/doff gown Lower Body Dressing: Total assistance;+2 for physical assistance;+2 for safety/equipment;Bed level;Cueing for sequencing;Cueing for compensatory techniques (cues for assisting with bed mobility)     Toilet Transfer Details (indicate cue type and reason): unable this session Toileting- Clothing Manipulation and Hygiene: Total assistance;+2 for physical assistance;+2 for safety/equipment;Bed level;Cueing for sequencing;Cueing for compensatory techniques (cues for assisting with bed mobility)               Vision Baseline Vision/History: 1 Wears glasses Ability to See in Adequate Light: 0 Adequate Patient Visual Report: No change from baseline       Perception         Praxis         Pertinent Vitals/Pain Pain Assessment Pain Assessment: Faces Faces Pain Scale: Hurts whole lot Pain Location: R LE (worse in foot), pannus Pain Descriptors / Indicators: Aching, Discomfort, Grimacing, Sore, Tightness Pain Intervention(s): Limited activity within patient's tolerance, Monitored during session, Repositioned     Extremity/Trunk Assessment Upper Extremity Assessment Upper Extremity Assessment: Right hand dominant;Generalized weakness;RUE deficits/detail;LUE deficits/detail RUE Deficits / Details: AROM shoulder flexion to approx. 100 degrees; generalized weakness; edematous throughout LUE Deficits / Details: AROM shoulder flexion to approx. 110 degrees; generalized weakness; skin breakdown and fluid filled blister on dorsal side of upper arm with RN aware; edematous throughout   Lower  Extremity Assessment Lower Extremity Assessment: Defer to PT evaluation   Cervical / Trunk Assessment Cervical / Trunk Assessment: Other exceptions Cervical / Trunk Exceptions: increased body habitus; noted redness in fold of pannus and around genital area with skin breakdown and pus noted in skin folds of pannus on Right side - RN and MD notified.   Communication Communication Communication: No apparent difficulties   Cognition Arousal: Alert Behavior During Therapy: WFL for tasks assessed/performed Cognition: Cognition impaired, No family/caregiver present to determine baseline     Awareness: Intellectual awareness intact, Online awareness impaired Memory impairment (select all impairments): Working memory Attention impairment (select first level of impairment): Alternating attention Executive functioning impairment (select all impairments): Problem solving, Reasoning, Organization OT - Cognition Comments: Pt AAOx4 with noted deficits above.                 Following commands: Impaired Following commands impaired: Only follows one step commands consistently, Follows one step commands inconsistently, Follows multi-step commands inconsistently, Follows multi-step commands with increased time     Cueing  General Comments   Cueing Techniques: Verbal cues;Gestural cues;Tactile cues;Visual cues  Pt with HR in the low-100s to 110s with noted V-tach. Pt O2 sat largely >/92% on RA but with one brief drop to 88% on RA with O2 sat quickly recovering to >/92%. PT respiration rate latgely 17 to 22 with elevation to 27 when in supine being repositioned in bed.   Exercises     Shoulder Instructions      Home Living Family/patient expects to be discharged to:: Private residence Living Arrangements: Spouse/significant other;Children (son) Available Help at  Discharge: Family;Available 24 hours/day Type of Home: House Home Access: Stairs to enter;Ramped entrance (Flexsteps- steps  convert to ramp) Entrance Stairs-Number of Steps: 4 Entrance Stairs-Rails: Right;Left;Can reach both Home Layout: Two level;Able to live on main level with bedroom/bathroom (son lives in finished basement; pt and his wife live on main floor) Alternate Level Stairs-Number of Steps: flight   Bathroom Shower/Tub: Walk-in shower (with about a 6" lip)   Bathroom Toilet: Handicapped height     Home Equipment: Agricultural consultant (2 wheels);Lift chair (bariatric RW; power wheelchair has been ordered)   Additional Comments: pt reports he sleeps in his lift chair; pt reports he does not use a shower chair due to not being able to fit in shower if a chair is in the shower; uncertain how much family is able to assist as pt reports his wife uses a walker and a shower chair; has a dog      Prior Functioning/Environment Prior Level of Function : Independent/Modified Independent             Mobility Comments: Pt reports he was Mod I for funcitonal mobility with a bariatric RW. Pt with fall in bathroom requiring EMS assist and leading to this admission. ADLs Comments: Pt reports he was Independent to Mod I with ADLs and that he and family all assist with IADLs. However, uncertain of accuracy of pt report as pt with noted skin breakdown with areas covered in pus in skin folds at Right side of pannus and skin redness and irritation in skin folds on Left side of pannus and around groin, B feet, B calves, and on Left upper arm, as well. Pt reports he does not drive.    OT Problem List: Decreased strength;Decreased range of motion;Decreased activity tolerance;Obesity;Pain   OT Treatment/Interventions: Self-care/ADL training;Therapeutic exercise;DME and/or AE instruction;Therapeutic activities;Patient/family education;Balance training;Energy conservation      OT Goals(Current goals can be found in the care plan section)   Acute Rehab OT Goals Patient Stated Goal: to feel better and return home OT Goal  Formulation: With patient Time For Goal Achievement: 09/19/23 Potential to Achieve Goals: Good ADL Goals Pt Will Perform Grooming: with contact guard assist;sitting (sitting EOB for 3 ore more minutes with Fair balance) Pt Will Perform Upper Body Bathing: with min assist;sitting Pt Will Perform Upper Body Dressing: with min assist;sitting Pt Will Transfer to Toilet: with mod assist;with +2 assist;bedside commode (step-pivot transfer) Pt Will Perform Toileting - Clothing Manipulation and hygiene: with max assist;with 2+ total assist;sit to/from stand;sitting/lateral leans Pt/caregiver will Perform Home Exercise Program: Increased strength;Increased ROM;Both right and left upper extremity;With Supervision;With written HEP provided (AROM progressing to theraband)   OT Frequency:  Min 2X/week    Co-evaluation PT/OT/SLP Co-Evaluation/Treatment: Yes Reason for Co-Treatment: Complexity of the patient's impairments (multi-system involvement);To address functional/ADL transfers;For patient/therapist safety   OT goals addressed during session: ADL's and self-care;Strengthening/ROM      AM-PAC OT "6 Clicks" Daily Activity     Outcome Measure Help from another person eating meals?: Total (NPO) Help from another person taking care of personal grooming?: A Little Help from another person toileting, which includes using toliet, bedpan, or urinal?: Total Help from another person bathing (including washing, rinsing, drying)?: A Lot Help from another person to put on and taking off regular upper body clothing?: A Lot Help from another person to put on and taking off regular lower body clothing?: Total 6 Click Score: 10   End of Session Equipment Utilized During Treatment: Other (comment) (maxi  slide sheet) Nurse Communication: Mobility status;Other (comment) (Pt with noted redness in fold of pannus and around genital area with skin breakdown and pus noted in skin folds of pannus on Right  side)  Activity Tolerance: Patient limited by fatigue;Patient limited by pain;Other (comment) (limited by increased body habitus) Patient left: in bed;with call bell/phone within reach  OT Visit Diagnosis: Other abnormalities of gait and mobility (R26.89);History of falling (Z91.81);Muscle weakness (generalized) (M62.81);Other (comment) (decreased activity tolerance)                Time: 6962-9528 OT Time Calculation (min): 55 min Charges:  OT General Charges $OT Visit: 1 Visit OT Evaluation $OT Eval Moderate Complexity: 1 Mod OT Treatments $Self Care/Home Management : 8-22 mins  Reana Chacko "Darral Ellis., OTR/L, MA Acute Rehab [phone]  Walt Gunner 09/05/2023, 1:28 PM

## 2023-09-05 NOTE — Progress Notes (Signed)
 Pt Lac IV infiltrated. Site is warm to touch,red and blistered. Pt received vancomycin  in ED and I administered maxipime on the floor. MD [facility]) notified per pharmacy and instructed me to apply cold /warm compresses according to effectiveness. Cold compresses at this time. Will continue to assess. IV removed.

## 2023-09-06 DIAGNOSIS — R6510 Systemic inflammatory response syndrome (SIRS) of non-infectious origin without acute organ dysfunction: Secondary | ICD-10-CM | POA: Diagnosis not present

## 2023-09-06 LAB — CBC WITH DIFFERENTIAL/PLATELET
Abs Immature Granulocytes: 0.14 10*3/uL — ABNORMAL HIGH (ref 0.00–0.07)
Basophils Absolute: 0 10*3/uL (ref 0.0–0.1)
Basophils Relative: 0 %
Eosinophils Absolute: 0 10*3/uL (ref 0.0–0.5)
Eosinophils Relative: 0 %
HCT: 34.4 % — ABNORMAL LOW (ref 39.0–52.0)
Hemoglobin: 10.5 g/dL — ABNORMAL LOW (ref 13.0–17.0)
Immature Granulocytes: 1 %
Lymphocytes Relative: 5 %
Lymphs Abs: 0.9 10*3/uL (ref 0.7–4.0)
MCH: 27 pg (ref 26.0–34.0)
MCHC: 30.5 g/dL (ref 30.0–36.0)
MCV: 88.4 fL (ref 80.0–100.0)
Monocytes Absolute: 1.6 10*3/uL — ABNORMAL HIGH (ref 0.1–1.0)
Monocytes Relative: 8 %
Neutro Abs: 16.2 10*3/uL — ABNORMAL HIGH (ref 1.7–7.7)
Neutrophils Relative %: 86 %
Platelets: 209 10*3/uL (ref 150–400)
RBC: 3.89 MIL/uL — ABNORMAL LOW (ref 4.22–5.81)
RDW: 15.9 % — ABNORMAL HIGH (ref 11.5–15.5)
WBC: 18.9 10*3/uL — ABNORMAL HIGH (ref 4.0–10.5)
nRBC: 0 % (ref 0.0–0.2)

## 2023-09-06 LAB — COMPREHENSIVE METABOLIC PANEL WITH GFR
ALT: 43 U/L (ref 0–44)
AST: 60 U/L — ABNORMAL HIGH (ref 15–41)
Albumin: 2.6 g/dL — ABNORMAL LOW (ref 3.5–5.0)
Alkaline Phosphatase: 56 U/L (ref 38–126)
Anion gap: 13 (ref 5–15)
BUN: 22 mg/dL (ref 8–23)
CO2: 25 mmol/L (ref 22–32)
Calcium: 8.5 mg/dL — ABNORMAL LOW (ref 8.9–10.3)
Chloride: 100 mmol/L (ref 98–111)
Creatinine, Ser: 1.18 mg/dL (ref 0.61–1.24)
GFR, Estimated: >60 mL/min (ref >60)
Glucose, Bld: 132 mg/dL — ABNORMAL HIGH (ref 70–99)
Potassium: 3.9 mmol/L (ref 3.5–5.1)
Sodium: 138 mmol/L (ref 135–145)
Total Bilirubin: 1.1 mg/dL (ref 0.0–1.2)
Total Protein: 6.5 g/dL (ref 6.5–8.1)

## 2023-09-06 LAB — CK: Total CK: 956 U/L — ABNORMAL HIGH (ref 49–397)

## 2023-09-06 MED ORDER — FLUTICASONE PROPIONATE 50 MCG/ACT NA SUSP
1.0000 | Freq: Every day | NASAL | Status: DC
  Administered 2023-09-06 – 2023-09-13 (×8): 1 via NASAL
  Filled 2023-09-06: qty 16

## 2023-09-06 MED ORDER — VANCOMYCIN HCL 1.25 G IV SOLR
1250.0000 mg | Freq: Two times a day (BID) | INTRAVENOUS | Status: DC
  Administered 2023-09-06 – 2023-09-07 (×3): 1250 mg via INTRAVENOUS
  Filled 2023-09-06 (×3): qty 25

## 2023-09-06 MED ORDER — ADULT MULTIVITAMIN W/MINERALS CH
1.0000 | ORAL_TABLET | Freq: Every day | ORAL | Status: DC
  Administered 2023-09-06 – 2023-09-13 (×8): 1 via ORAL
  Filled 2023-09-06 (×8): qty 1

## 2023-09-06 MED ORDER — JUVEN PO PACK
1.0000 | PACK | Freq: Two times a day (BID) | ORAL | Status: DC
  Administered 2023-09-07 – 2023-09-09 (×3): 1
  Filled 2023-09-06 (×4): qty 1

## 2023-09-06 MED ORDER — PROSOURCE PLUS PO LIQD
30.0000 mL | Freq: Two times a day (BID) | ORAL | Status: DC
  Administered 2023-09-06 – 2023-09-13 (×13): 30 mL via ORAL
  Filled 2023-09-06 (×14): qty 30

## 2023-09-06 MED ORDER — SODIUM CHLORIDE 0.9 % IV SOLN
2.0000 g | INTRAVENOUS | Status: DC
  Administered 2023-09-06: 2 g via INTRAVENOUS

## 2023-09-06 MED ORDER — ENSURE MAX PROTEIN PO LIQD
11.0000 [oz_av] | Freq: Every day | ORAL | Status: DC
  Administered 2023-09-06 – 2023-09-12 (×7): 11 [oz_av] via ORAL
  Filled 2023-09-06 (×8): qty 330

## 2023-09-06 MED ORDER — SODIUM CHLORIDE 0.9 % IV SOLN: INTRAVENOUS | Status: AC

## 2023-09-06 NOTE — TOC Progression Note (Signed)
 Transition of Care [facility]) - Progression Note    Patient Details  Name: Curtis Hill MRN: [id number] Date of Birth: 1945-08-29  Transition of Care [facility]) CM/SW Contact  Arron Big, [place] Phone Number: 09/06/2023, 9:17 AM  Clinical Narrative:   Per chart review, NCM spoke with patient and he wants to go home at discharge. CSW spoke with [name], patient's son [facility]), and followed up on plans for discharge. [name] states his dad wants to go home at this time.  TOC will continue to follow.      Barriers to Discharge: Continued Medical Work up  Expected Discharge Plan and Services   Discharge Planning Services: CM Consult                                           Social Determinants of Health (SDOH) Interventions SDOH Screenings   Depression (PHQ2-9): Medium Risk (05/23/2020)  Tobacco Use: Low Risk  (09/04/2023)    Readmission Risk Interventions     No data to display

## 2023-09-06 NOTE — Progress Notes (Signed)
 Initial Nutrition Assessment  DOCUMENTATION CODES:   Morbid obesity  INTERVENTION:   Juven BID to promote wound healing,  each packet provides 80 calories, 8 grams of carbohydrate, 2.5  grams of protein (collagen), 7 grams of L-arginine and 7 grams of L-glutamine; supplement contains CaHMB, Vitamins C, E, B12 and Zinc   30 ml ProSource Plus BID, each supplement provides 100 kcals and 15 grams protein.   Ensure Max po at bedtime, each supplement provides 150 kcal and 30 grams of protein.   MVI with Minerals daily   NUTRITION DIAGNOSIS:   Increased nutrient needs related to wound healing as evidenced by estimated needs.  GOAL:   Patient will meet greater than or equal to 90% of their needs  MONITOR:   PO intake, Supplement acceptance, Labs, Skin  REASON FOR ASSESSMENT:   Consult Assessment of nutrition requirement/status  ASSESSMENT:   78 yo male admitted post fall with associated weakness with sepsis secondary to cellulitis/panniculitis. PMH includes HTN, obesity, HLD  +emesis (bilious) yesterday Currently on heart healthy diet, no recorded po intake yet  Unable to obtain diet and weight history from patient at this time  WOC consulted for skin breakdown under pannus; wound with erythema with scattered partial thickness skin loss  Per PT evaluation, pt reports sleeping in lift chair and able to ambulate with RW (power wheelchair has been ordered). Pt performs his own bathing and dressing  Labs: Creatinine 1.18 BUN 22  Meds:  reviewed   Diet Order:   Diet Order             Diet Heart Room service appropriate? Yes; Fluid consistency: Thin  Diet effective now                   EDUCATION NEEDS:   Not appropriate for education at this time  Skin:  Skin Assessment: Skin Integrity Issues: Skin Integrity Issues:: Other (Comment) Other: pannus: wound/cellulitis  Last BM:  5/23  Height:   Ht Readings from Last 1 Encounters:  09/04/23 5\' 7"  (1.702  m)    Weight:   Wt Readings from Last 1 Encounters:  09/06/23 (!) 219.5 kg     BMI:  Body mass index is 75.79 kg/m.  Estimated Nutritional Needs:   Kcal:  1900-2100 kcals  Protein:  125-145 g  Fluid:  >/= 2L  Norvel Beer MS, RDN, LDN, CNSC Registered Dietitian 3 Clinical Nutrition RD Inpatient Contact Info in Amion

## 2023-09-06 NOTE — Progress Notes (Signed)
 Pharmacy Antibiotic Note  Curtis Hill is a 78 y.o. male for which pharmacy has been consulted for cefepime and vancomycin  dosing for sepsis.  Patient with a history of obesity, HTN, HLD, aortic aneurysm. Patient presenting with weakness.  SCr improved to 1.18, previous baseline seems to be around 1.0.  WBC improved to 18.9, Tmax 101.2  Suscpected source is cellulitis, pseudomonas coverage is likely not needed. Discussed with MD and will narrow to ceftriaxone.   Plan: Metronidazole per MD Stop Cefepime 2g q8hr, narrow to ceftriaxone 2g q24h Increase Vancomycin  1250 mg q12hr (eAUC 505.3, Vd 0.5) Monitor WBC, fever, renal function, cultures De-escalate when able Levels at steady state  Height: 5\' 7"  (170.2 cm) Weight: (!) 219.5 kg (483 lb 14.6 oz) IBW/kg (Calculated) : 66.1  Temp (24hrs), Avg:100.3 F (37.9 C), Min:99.5 F (37.5 C), Max:101.7 F (38.7 C)  Recent Labs  Lab 09/04/23 1714 09/04/23 1725 09/05/23 0831 09/06/23 0256  WBC 20.1*  --  22.6* 18.9*  CREATININE 1.43*  --  1.29* 1.18  LATICACIDVEN  --  1.3  --   --     Estimated Creatinine Clearance: 94.5 mL/min (by C-G formula based on SCr of 1.18 mg/dL).    Allergies  Allergen Reactions   Levofloxacin Other (See Comments)    Aortic aneurysm   Lisinopril Anaphylaxis, Shortness Of Breath, Swelling and Rash   Prednisone  Swelling   Microbiology results: 5/24 Bcx: ngtd x2 5/24 MRSA PCR: negative  Thank you for allowing pharmacy to be a part of this patient's care.   Valarie Garner, PharmD PGY1 Pharmacy Resident  Please check AMION for all [facility] Pharmacy phone numbers After 10:00 PM, call Main Pharmacy [phone] 09/06/23 8:02 AM

## 2023-09-06 NOTE — Progress Notes (Signed)
 PROGRESS NOTE    Curtis Hill  LOV:[id number] DOB: 1945-10-23 DOA: 09/04/2023 PCP: Jimmey Mould, MD   Brief Narrative: Curtis Hill is a 78 y.o. male with a history of morbid obesity, hypertension, hyperlipidemia, aortic aneurysm.  Patient presented secondary to a fall with associated generalized weakness and found to have evidence of sepsis on admission presumed secondary to panniculitis. Empiric antibiotics started.   Assessment and Plan:  Sepsis Present on admission. Secondary to cellulitis. Fevers with leukocytosis. Blood cultures obtained on admission. Empiric Vancomycin , Cefepime and Flagyl. Leukocytosis is improving with antibiotics. -Discontinue Cefepime -Continue Vancomycin  and start Ceftriaxone  Panniculitis Cellulitis Noted clinically and on CT imaging. Patient started empirically on antibiotics, in addition to nystatin. Wound care consulted. -Continue Vancomycin , and start Ceftriaxone -Discontinue Cefepime -Continue nystatin  AKI Baseline creatinine from 2023 of around 1. Creatinine of 1.43 on admission. Improvement to 1.18, around baseline. AKI resolved.  Rhabdomyolysis Likely secondary to long lie after fall. Elevated CK of 990 on admission with associated elevated AST/ALT. CK improving with IV fluids. -Continue normal saline IV fluids  Primary hypertension Patient is on valsartan as an outpatient which was held on admission  Hyperlipidemia -Continue Lipitor  Iron deficiency anemia Mild anemia. Stable.  Ascending aortic aneurysm Measuring 45 mm, up from 42 mm one year prior. Recommendation for semi-annual imaging with CTA or MRA in addition to cardiothoracic surgery referral.  Obesity, class III Estimated body mass index is 75.79 kg/m as calculated from the following:   Height as of this encounter: 5\' 7"  (1.702 m).   Weight as of this encounter: 219.5 kg.   DVT prophylaxis: Lovenox Code Status:   Code Status: Full Code Family  Communication: None Disposition Plan: Discharge pending ability to transition to outpatient antibiotic regimen and improvement of fever curve   Consultants:  None  Procedures:  None  Antimicrobials: Vancomycin  Cefepime Flagyl    Subjective: Febrile overnight. Some dyspnea and congestion. No other concerns.  Objective: BP 131/76 (BP Location: Left Wrist)   Pulse 95   Temp 99.9 F (37.7 C) Comment: Karen RN notified  Resp (!) 30   Ht 5\' 7"  (1.702 m)   Wt (!) 219.5 kg   SpO2 93%   BMI 75.79 kg/m   Examination:  General exam: Appears calm and comfortable Respiratory system: Clear to auscultation. Respiratory effort normal. Cardiovascular system: S1 & S2 heard, RRR. No murmurs, rubs, gallops or clicks. Gastrointestinal system: Abdomen is obese with large panus. Normal bowel sounds heard. Central nervous system: Alert and oriented. No focal neurological deficits. Psychiatry: Judgement and insight appear normal. Mood & affect appropriate.    Data Reviewed: I have personally reviewed following labs and imaging studies  CBC Lab Results  Component Value Date   WBC 18.9 (H) 09/06/2023   RBC 3.89 (L) 09/06/2023   HGB 10.5 (L) 09/06/2023   HCT 34.4 (L) 09/06/2023   MCV 88.4 09/06/2023   MCH 27.0 09/06/2023   PLT 209 09/06/2023   MCHC 30.5 09/06/2023   RDW 15.9 (H) 09/06/2023   LYMPHSABS 0.9 09/06/2023   MONOABS 1.6 (H) 09/06/2023   EOSABS 0.0 09/06/2023   BASOSABS 0.0 09/06/2023     Last metabolic panel Lab Results  Component Value Date   NA 138 09/06/2023   K 3.9 09/06/2023   CL 100 09/06/2023   CO2 25 09/06/2023   BUN 22 09/06/2023   CREATININE 1.18 09/06/2023   GLUCOSE 132 (H) 09/06/2023   GFRNONAA >60 09/06/2023   GFRAA 89  05/23/2020   CALCIUM  8.5 (L) 09/06/2023   PHOS 2.7 09/05/2023   PROT 6.5 09/06/2023   ALBUMIN 2.6 (L) 09/06/2023   LABGLOB 2.9 05/23/2020   AGRATIO 1.5 05/23/2020   BILITOT 1.1 09/06/2023   ALKPHOS 56 09/06/2023   AST 60 (H)  09/06/2023   ALT 43 09/06/2023   ANIONGAP 13 09/06/2023    GFR: Estimated Creatinine Clearance: 94.5 mL/min (by C-G formula based on SCr of 1.18 mg/dL).  Recent Results (from the past 240 hours)  Blood Culture (routine x 2)     Status: None (Preliminary result)   Collection Time: 09/04/23  5:15 PM   Specimen: BLOOD  Result Value Ref Range Status   Specimen Description BLOOD SITE NOT SPECIFIED  Final   Special Requests   Final    BOTTLES DRAWN AEROBIC AND ANAEROBIC Blood Culture results may not be optimal due to an inadequate volume of blood received in culture bottles   Culture   Final    NO GROWTH 2 DAYS Performed at [facility], [address], [place], [place] [address]    Report Status PENDING  Incomplete  Resp panel by RT-PCR (RSV, Flu A&B, Covid) Anterior Nasal Swab     Status: None   Collection Time: 09/04/23  6:48 PM   Specimen: Anterior Nasal Swab  Result Value Ref Range Status   SARS Coronavirus 2 by RT PCR NEGATIVE NEGATIVE Final   Influenza A by PCR NEGATIVE NEGATIVE Final   Influenza B by PCR NEGATIVE NEGATIVE Final    Comment: (NOTE) The Xpert Xpress SARS-CoV-2/FLU/RSV plus assay is intended as an aid in the diagnosis of influenza from Nasopharyngeal swab specimens and should not be used as a sole basis for treatment. Nasal washings and aspirates are unacceptable for Xpert Xpress SARS-CoV-2/FLU/RSV testing.  Fact Sheet for Patients: BloggerCourse.com  Fact Sheet for Healthcare Providers: SeriousBroker.it  This test is not yet approved or cleared by the [place]  FDA and has been authorized for detection and/or diagnosis of SARS-CoV-2 by FDA under an Emergency Use Authorization (EUA). This EUA will remain in effect (meaning this test can be used) for the duration of the COVID-19 declaration under Section 564(b)(1) of the Act, 21 U.S.C. section 360bbb-3(b)(1), unless the authorization is terminated  or revoked.     Resp Syncytial Virus by PCR NEGATIVE NEGATIVE Final    Comment: (NOTE) Fact Sheet for Patients: BloggerCourse.com  Fact Sheet for Healthcare Providers: SeriousBroker.it  This test is not yet approved or cleared by the [place]  FDA and has been authorized for detection and/or diagnosis of SARS-CoV-2 by FDA under an Emergency Use Authorization (EUA). This EUA will remain in effect (meaning this test can be used) for the duration of the COVID-19 declaration under Section 564(b)(1) of the Act, 21 U.S.C. section 360bbb-3(b)(1), unless the authorization is terminated or revoked.  Performed at [facility], [address] [place], [place] [address]   MRSA Next Gen by PCR, Nasal     Status: None   Collection Time: 09/04/23  9:50 PM   Specimen: Nasal Mucosa; Nasal Swab  Result Value Ref Range Status   MRSA by PCR Next Gen NOT DETECTED NOT DETECTED Final    Comment: (NOTE) The GeneXpert MRSA Assay (FDA approved for NASAL specimens only), is one component of a comprehensive MRSA colonization surveillance program. It is not intended to diagnose MRSA infection nor to guide or monitor treatment for MRSA infections. Test performance is not FDA approved in patients less  than 71 years old. Performed at [facility], [address] [place], [place] [address]   Blood Culture (routine x 2)     Status: None (Preliminary result)   Collection Time: 09/05/23  3:46 AM   Specimen: BLOOD  Result Value Ref Range Status   Specimen Description BLOOD SITE NOT SPECIFIED  Final   Special Requests   Final    BOTTLES DRAWN AEROBIC ONLY Blood Culture results may not be optimal due to an inadequate volume of blood received in culture bottles   Culture   Final    NO GROWTH 1 DAY Performed at [facility], [address], [place], [place] [address]    Report Status PENDING  Incomplete      Radiology Studies: CT  CHEST ABDOMEN PELVIS WO CONTRAST Result Date: 09/05/2023 CLINICAL DATA:  Systemic inflammatory response syndrome. Possible sepsis. EXAM: CT CHEST, ABDOMEN AND PELVIS WITHOUT CONTRAST TECHNIQUE: Multidetector CT imaging of the chest, abdomen and pelvis was performed following the standard protocol without IV contrast. RADIATION DOSE REDUCTION: This exam was performed according to the departmental dose-optimization program which includes automated exposure control, adjustment of the mA and/or kV according to patient size and/or use of iterative reconstruction technique. COMPARISON:  Abdominal radiograph 09/04/2023; CT chest 06/22/2022; CT abdomen pelvis 05/18/2017; chest radiograph 09/04/2023 FINDINGS: CT CHEST FINDINGS Cardiovascular: No pericardial effusion. Coronary artery and aortic atherosclerotic calcification. The ascending aorta is dilated measuring 45 mm in diameter. This is increased compared to 06/22/2022 when it measured 42 mm using similar measuring technique. Mediastinum/Nodes: Trachea and esophagus are unremarkable. No thoracic adenopathy. Lungs/Pleura: No focal consolidation, pleural effusion, or pneumothorax. Musculoskeletal: No acute fracture. CT ABDOMEN PELVIS FINDINGS Hepatobiliary: Mild nodularity of the hepatic contour with hypertrophy of the caudate lobe. Decompressed gallbladder. No biliary dilation. Pancreas: Fatty atrophy.  No acute abnormality. Spleen: Unremarkable. Adrenals/Urinary Tract: Normal adrenal glands. Intermediate density exophytic cystic lesion off the posterior right kidney measuring 1.7 cm. This likely represents a proteinaceous or hemorrhagic cyst and has decreased in size from 2019 when it measured 2.7 cm. Given decrease in size, no follow-up is recommended. No urinary calculi or hydronephrosis. Unremarkable bladder. Stomach/Bowel: Normal caliber large and small bowel. Extensive diverticulosis of the sigmoid colon without diverticulitis. Stomach is within normal limits.  Vascular/Lymphatic: Aortic atherosclerosis. No enlarged abdominal or pelvic lymph nodes. Reproductive: Metallic seeds in the prostate. Other: No free intraperitoneal fluid or air. Musculoskeletal: No acute fracture. Partially visualized soft tissue thickening, free fluid, and fat stranding in the lower abdominal wall pannus. No abscess. IMPRESSION: 1. Partially visualized soft tissue thickening, free fluid, and fat stranding in the lower abdominal wall pannus. No abscess. Correlate for panniculitis/cellulitis. 2. Similar findings of cirrhosis. 3. Ascending aortic aneurysm measuring 45 mm in diameter, increased from 06/22/2022 when it measured 42 mm. Ascending thoracic aortic aneurysm. Recommend semi-annual imaging followup by CTA or MRA and referral to cardiothoracic surgery if not already obtained. This recommendation follows 2010 ACCF/AHA/AATS/ACR/ASA/SCA/SCAI/SIR/STS/SVM Guidelines for the Diagnosis and Management of Patients With Thoracic Aortic Disease. Circulation. 2010; 121: U981-X914. Aortic aneurysm NOS (ICD10-I71.9) 4. Aortic Atherosclerosis (ICD10-I70.0). Electronically Signed   By: Rozell Cornet M.D.   On: 09/05/2023 01:40   DG Abd 1 View Result Date: 09/04/2023 CLINICAL DATA:  [id number] SIRS (systemic inflammatory response syndrome) (HCC) [id number] EXAM: ABDOMEN - 1 VIEW COMPARISON:  CT abdomen pelvis 05/18/2017 FINDINGS: The bowel gas pattern is normal. Radiation seeds overlie the expected region of the prostate. No radio-opaque calculi or other significant  radiographic abnormality are seen. IMPRESSION: Nonobstructive bowel gas pattern. Electronically Signed   By: Morgane  Naveau M.D.   On: 09/04/2023 22:41   DG Chest Port 1 View Result Date: 09/04/2023 CLINICAL DATA:  Possible sepsis. EXAM: PORTABLE CHEST 1 VIEW COMPARISON:  10/25/2016. FINDINGS: Evaluation is limited due to patient's body habitus and rotation. The heart is enlarged. Evaluation of the mediastinal structures is limited. The tracheal  column appears patent. Atelectasis is noted at the left lung base. There is a small right pleural effusion with atelectasis or infiltrate. No pneumothorax is seen. No acute osseous abnormality. IMPRESSION: 1. Small right pleural effusion with atelectasis or infiltrate. 2. Left basilar atelectasis. 3. Cardiomegaly. Electronically Signed   By: Wyvonnia Heimlich M.D.   On: 09/04/2023 18:06      LOS: 1 day    Aneita Keens, MD [facility] 09/06/2023, 10:38 AM   If 7PM-7AM, please contact night-coverage [email or web address]

## 2023-09-06 NOTE — Plan of Care (Signed)
  Problem: Fluid Volume: Goal: Hemodynamic stability will improve Outcome: Progressing   Problem: Respiratory: Goal: Ability to maintain adequate ventilation will improve Outcome: Progressing   Problem: Clinical Measurements: Goal: Ability to maintain clinical measurements within normal limits will improve Outcome: Progressing Goal: Will remain free from infection Outcome: Progressing

## 2023-09-06 NOTE — Progress Notes (Signed)
   09/06/23 2115  BiPAP/CPAP/SIPAP  BiPAP/CPAP/SIPAP Pt Type Adult  BiPAP/CPAP/SIPAP Resmed  Mask Type Full face mask  Mask Size Large  Respiratory Rate 17 breaths/min  EPAP 10 cmH2O  Patient Home Machine No  Patient Home Mask No  Patient Home Tubing No  Auto Titrate No  BiPAP/CPAP /SiPAP Vitals  Pulse Rate 91  Resp 17  SpO2 94 %  Bilateral Breath Sounds Diminished  MEWS Score/Color  MEWS Score 0  MEWS Score Color Curtis Hill

## 2023-09-07 ENCOUNTER — Inpatient Hospital Stay (HOSPITAL_COMMUNITY)

## 2023-09-07 DIAGNOSIS — R6510 Systemic inflammatory response syndrome (SIRS) of non-infectious origin without acute organ dysfunction: Secondary | ICD-10-CM | POA: Diagnosis not present

## 2023-09-07 LAB — CBC
HCT: 32.4 % — ABNORMAL LOW (ref 39.0–52.0)
Hemoglobin: 10 g/dL — ABNORMAL LOW (ref 13.0–17.0)
MCH: 26.7 pg (ref 26.0–34.0)
MCHC: 30.9 g/dL (ref 30.0–36.0)
MCV: 86.4 fL (ref 80.0–100.0)
Platelets: 205 10*3/uL (ref 150–400)
RBC: 3.75 MIL/uL — ABNORMAL LOW (ref 4.22–5.81)
RDW: 15.7 % — ABNORMAL HIGH (ref 11.5–15.5)
WBC: 13 10*3/uL — ABNORMAL HIGH (ref 4.0–10.5)
nRBC: 0 % (ref 0.0–0.2)

## 2023-09-07 LAB — COMPREHENSIVE METABOLIC PANEL WITH GFR
ALT: 54 U/L — ABNORMAL HIGH (ref 0–44)
AST: 71 U/L — ABNORMAL HIGH (ref 15–41)
Albumin: 2.4 g/dL — ABNORMAL LOW (ref 3.5–5.0)
Alkaline Phosphatase: 57 U/L (ref 38–126)
Anion gap: 6 (ref 5–15)
BUN: 21 mg/dL (ref 8–23)
CO2: 27 mmol/L (ref 22–32)
Calcium: 8.3 mg/dL — ABNORMAL LOW (ref 8.9–10.3)
Chloride: 104 mmol/L (ref 98–111)
Creatinine, Ser: 1.05 mg/dL (ref 0.61–1.24)
GFR, Estimated: >60 mL/min (ref >60)
Glucose, Bld: 115 mg/dL — ABNORMAL HIGH (ref 70–99)
Potassium: 4.1 mmol/L (ref 3.5–5.1)
Sodium: 137 mmol/L (ref 135–145)
Total Bilirubin: 0.8 mg/dL (ref 0.0–1.2)
Total Protein: 6.4 g/dL — ABNORMAL LOW (ref 6.5–8.1)

## 2023-09-07 LAB — CK: Total CK: 536 U/L — ABNORMAL HIGH (ref 49–397)

## 2023-09-07 MED ORDER — CEFADROXIL 500 MG PO CAPS
500.0000 mg | ORAL_CAPSULE | Freq: Two times a day (BID) | ORAL | Status: DC
  Administered 2023-09-07 – 2023-09-13 (×13): 500 mg via ORAL
  Filled 2023-09-07 (×14): qty 1

## 2023-09-07 MED ORDER — ENOXAPARIN SODIUM 100 MG/ML IJ SOSY
100.0000 mg | PREFILLED_SYRINGE | INTRAMUSCULAR | Status: DC
  Administered 2023-09-07 – 2023-09-12 (×6): 100 mg via SUBCUTANEOUS
  Filled 2023-09-07 (×6): qty 1

## 2023-09-07 MED ORDER — BISMUTH SUBSALICYLATE 262 MG/15ML PO SUSP
30.0000 mL | Freq: Once | ORAL | Status: AC
  Administered 2023-09-07: 30 mL via ORAL
  Filled 2023-09-07: qty 236

## 2023-09-07 MED ORDER — DOXYCYCLINE HYCLATE 100 MG PO TABS
100.0000 mg | ORAL_TABLET | Freq: Two times a day (BID) | ORAL | Status: DC
  Administered 2023-09-07 – 2023-09-13 (×13): 100 mg via ORAL
  Filled 2023-09-07 (×13): qty 1

## 2023-09-07 MED ORDER — IRBESARTAN 300 MG PO TABS
300.0000 mg | ORAL_TABLET | Freq: Every day | ORAL | Status: DC
  Administered 2023-09-07 – 2023-09-13 (×7): 300 mg via ORAL
  Filled 2023-09-07 (×8): qty 1

## 2023-09-07 MED ORDER — PANTOPRAZOLE SODIUM 40 MG PO TBEC
40.0000 mg | DELAYED_RELEASE_TABLET | Freq: Every day | ORAL | Status: DC
  Administered 2023-09-07 – 2023-09-13 (×7): 40 mg via ORAL
  Filled 2023-09-07 (×7): qty 1

## 2023-09-07 MED ORDER — BISMUTH SUBSALICYLATE 262 MG/15ML PO SUSP
30.0000 mL | Freq: Three times a day (TID) | ORAL | Status: DC
  Administered 2023-09-07 – 2023-09-13 (×24): 30 mL via ORAL
  Filled 2023-09-07 (×2): qty 236

## 2023-09-07 NOTE — Progress Notes (Signed)
 Physical Therapy Treatment Patient Details Name: Curtis Hill MRN: [id number] DOB: 07-04-1945 Today's Date: 09/07/2023   History of Present Illness Pt is a 78 yo male who presented to [facility] from home following fall when attempting to get up from toilet with pt requiring EMS assist. EMS found pt to be tachycardic and febrile. Pt with recent treatment for UTI and R foot ulcer. PMH: HTN, morbid obesity, OSA, thoracic aortic aneurysm, aortic stenosis, pulmonary hypertension    PT Comments  Patient progressing OOB to chair though with increased time, pain and effort (+3 for safety and for furniture rearrangement to allow up to chair).  He was not feeling ready as states recently got his breathing better and wife for a heart surgery today.  Patient encouraged as plans to go home.  Still weak and feel best plan would be for inpatient rehab.  PT will continue to follow and progress mobility as pt able.     If plan is discharge home, recommend the following: Two people to help with walking and/or transfers;Two people to help with bathing/dressing/bathroom;Assistance with cooking/housework;Direct supervision/assist for medications management;Direct supervision/assist for financial management;Assist for transportation;Help with stairs or ramp for entrance   Can travel by private vehicle     No  Equipment Recommendations  None recommended by PT    Recommendations for Other Services       Precautions / Restrictions Precautions Precautions: Fall Precaution/Restrictions Comments: watch vitals, skin integrity issues around panus     Mobility  Bed Mobility   Bed Mobility: Supine to Sit     Supine to sit: Max assist, +2 for safety/equipment, +2 for physical assistance, Used rails, HOB elevated     General bed mobility comments: HOB up as high as able, used bed pads to scoot hips and A For lifting trunk, initially pt leaning back, once able to get both feet on the ground and pannus safely over edge,  pt able to lean forward    Transfers Overall transfer level: Needs assistance Equipment used: Rolling walker (2 wheels) Transfers: Sit to/from Stand, Bed to chair/wheelchair/BSC Sit to Stand: Min assist, +2 physical assistance, +2 safety/equipment           General transfer comment: Min A x 2 to stand from bedside with RW, increased time to gain stance and place hand on RW. Initially reported unable to lift LLE but with increased time/effort, able to step some though recliner brought closer and bed moved for safety with Min A x 2 +3rd to move furniture    Ambulation/Gait                   Stairs             Wheelchair Mobility     Tilt Bed    Modified Rankin (Stroke Patients Only)       Balance Overall balance assessment: Needs assistance Sitting-balance support: No upper extremity supported, Feet supported Sitting balance-Leahy Scale: Poor Sitting balance - Comments: leaning back, assist from behind for initial sitting   Standing balance support: Bilateral upper extremity supported, During functional activity Standing balance-Leahy Scale: Poor                              Communication Communication Communication: No apparent difficulties  Cognition Arousal: Alert Behavior During Therapy: Anxious   PT - Cognitive impairments: Problem solving, Safety/Judgement  PT - Cognition Comments: did not want to get up today, though only wanting to go home, not to rehab Following commands: Impaired Following commands impaired: Only follows one step commands consistently, Follows one step commands with increased time    Cueing    Exercises      General Comments General comments (skin integrity, edema, etc.): BP 180's/115 after up in chair, HR 141 with activity, RN Aware      Pertinent Vitals/Pain Pain Assessment Faces Pain Scale: Hurts little more Pain Location: L arm Pain Descriptors / Indicators:  Grimacing, Guarding, Burning Pain Intervention(s): Monitored during session, Repositioned, Other (comment) (RN Aware needs dressing change)    Home Living                          Prior Function            PT Goals (current goals can now be found in the care plan section) Progress towards PT goals: Progressing toward goals    Frequency    Min 2X/week      PT Plan      Co-evaluation PT/OT/SLP Co-Evaluation/Treatment: Yes Reason for Co-Treatment: Complexity of the patient's impairments (multi-system involvement);To address functional/ADL transfers;For patient/therapist safety PT goals addressed during session: Mobility/safety with mobility;Balance;Proper use of DME OT goals addressed during session: ADL's and self-care;Strengthening/ROM      AM-PAC PT "6 Clicks" Mobility   Outcome Measure  Help needed turning from your back to your side while in a flat bed without using bedrails?: Total Help needed moving from lying on your back to sitting on the side of a flat bed without using bedrails?: Total Help needed moving to and from a bed to a chair (including a wheelchair)?: A Lot Help needed standing up from a chair using your arms (e.g., wheelchair or bedside chair)?: A Lot Help needed to walk in hospital room?: Total Help needed climbing 3-5 steps with a railing? : Total 6 Click Score: 8    End of Session   Activity Tolerance: Patient limited by fatigue Patient left: with call bell/phone within reach;in chair Nurse Communication: Mobility status;Other (comment);Need for lift equipment PT Visit Diagnosis: Difficulty in walking, not elsewhere classified (R26.2);Adult, failure to thrive (R62.7);Pain Pain - Right/Left: Right Pain - part of body: Ankle and joints of foot     Time: 4098-1191 PT Time Calculation (min) (ACUTE ONLY): 39 min  Charges:    $Therapeutic Activity: 8-22 mins PT General Charges $$ ACUTE PT VISIT: 1 Visit                     Abigail Hoff, PT Acute Rehabilitation Services Office:[phone] 09/07/2023    Curtis Hill 09/07/2023, 1:52 PM

## 2023-09-07 NOTE — TOC Progression Note (Addendum)
 Transition of Care [facility]) - Progression Note    Patient Details  Name: Curtis Hill MRN: [id number] Date of Birth: 1946/02/27  [facility] of [facility]) CM/SW Contact  Jennett Model, RN Phone Number: 09/07/2023, 4:16 PM  Clinical Narrative:    NCM spoke with patient, he does not want to go to a SNF, it is taking plus 2 people to asst him with getting up.  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  Will see if physical therapy can work with him tomorrow also.  Patient states his wife is here in the hospital having surgery today.      Barriers to Discharge: Continued Medical Work up  Expected Discharge Plan and Services   Discharge Planning Services: CM Consult                                           Social Determinants of Health (SDOH) Interventions SDOH Screenings   Food Insecurity: No Food Insecurity (09/07/2023)  Housing: Low Risk  (09/07/2023)  Transportation Needs: Unmet Transportation Needs (09/07/2023)  Utilities: Not At Risk (09/07/2023)  Depression (PHQ2-9): Medium Risk (05/23/2020)  Social Connections: Socially Integrated (09/07/2023)  Tobacco Use: Low Risk  (09/04/2023)    Readmission Risk Interventions     No data to display

## 2023-09-07 NOTE — Progress Notes (Signed)
 Physical Therapy Treatment Patient Details Name: Curtis Hill MRN: [id number] DOB: [birth year] Today's Date: 09/07/2023   History of Present Illness Pt is a 78 yo male who presented to [facility] from home following fall when attempting to get up from toilet with pt requiring EMS assist. EMS found pt to be tachycardic and febrile. Pt with recent treatment for UTI and R foot ulcer. PMH: HTN, morbid obesity, OSA, thoracic aortic aneurysm, aortic stenosis, pulmonary hypertension    PT Comments  Returned at nursing request to assist pt as lift not charged and not working.  Mobility specialist assisted as well for stand step to bed with HR still up to 140's and pt SOB and much difficulty with bed mobility.  Seemed more confident on his feet during standing transfer.  Feel continued skilled PT in the acute setting needed to progress to home if able (pt resistant to post-acute inpatient rehab).    If plan is discharge home, recommend the following: Two people to help with walking and/or transfers;Two people to help with bathing/dressing/bathroom;Assistance with cooking/housework;Direct supervision/assist for medications management;Direct supervision/assist for financial management;Assist for transportation;Help with stairs or ramp for entrance   Can travel by private vehicle     No  Equipment Recommendations  None recommended by PT    Recommendations for Other Services       Precautions / Restrictions Precautions Precautions: Fall Precaution/Restrictions Comments: watch vitals, skin integrity issues around panus     Mobility  Bed Mobility Overal bed mobility: Needs Assistance Bed Mobility: Sit to Supine     Supine to sit: Max assist, +2 for safety/equipment, +2 for physical assistance, Used rails, HOB elevated Sit to supine: +2 for physical assistance, Max assist   General bed mobility comments: unable to lift legs up or reposition trunk even though attempting to use maxi sky as over head  trapeze.  once tilted reached for headboard to pull up with A of 2; rolled with mod A for pad repositioning    Transfers Overall transfer level: Needs assistance Equipment used: Rolling walker (2 wheels) Transfers: Sit to/from Stand, Bed to chair/wheelchair/BSC Sit to Stand: Mod assist, +2 physical assistance   Step pivot transfers: Min assist, +2 physical assistance       General transfer comment: stepped to bed from recliner with bed positioned close; assist for balance and safety    Ambulation/Gait                   Stairs             Wheelchair Mobility     Tilt Bed    Modified Rankin (Stroke Patients Only)       Balance Overall balance assessment: Needs assistance Sitting-balance support: Feet supported Sitting balance-Leahy Scale: Fair Sitting balance - Comments: on edge of recliner chair able to sit unaided   Standing balance support: Bilateral upper extremity supported Standing balance-Leahy Scale: Poor Standing balance comment: UE support needed                            Communication Communication Communication: No apparent difficulties  Cognition Arousal: Alert Behavior During Therapy: Anxious   PT - Cognitive impairments: Problem solving, Safety/Judgement                       PT - Cognition Comments: did not want to get up today, though only wanting to go home, not to rehab Following commands: Impaired  Following commands impaired: Only follows one step commands consistently    Cueing Cueing Techniques: Verbal cues  Exercises      General Comments General comments (skin integrity, edema, etc.): HR up to 141 with moving to bed, purewick fell off, NT aware,      Pertinent Vitals/Pain Pain Assessment Pain Assessment: Faces Faces Pain Scale: Hurts little more Pain Location: L arm Pain Descriptors / Indicators: Grimacing, Guarding, Burning Pain Intervention(s): Monitored during session    Home Living    Living Arrangements: Spouse/significant other;Children                      Prior Function            PT Goals (current goals can now be found in the care plan section) Progress towards PT goals: Progressing toward goals    Frequency    Min 2X/week      PT Plan      Co-evaluation PT/OT/SLP Co-Evaluation/Treatment: Yes Reason for Co-Treatment: Complexity of the patient's impairments (multi-system involvement);To address functional/ADL transfers;For patient/therapist safety PT goals addressed during session: Mobility/safety with mobility;Balance;Proper use of DME        AM-PAC PT "6 Clicks" Mobility   Outcome Measure  Help needed turning from your back to your side while in a flat bed without using bedrails?: Total Help needed moving from lying on your back to sitting on the side of a flat bed without using bedrails?: Total Help needed moving to and from a bed to a chair (including a wheelchair)?: Total Help needed standing up from a chair using your arms (e.g., wheelchair or bedside chair)?: Total Help needed to walk in hospital room?: Total Help needed climbing 3-5 steps with a railing? : Total 6 Click Score: 6    End of Session   Activity Tolerance: Patient limited by fatigue Patient left: in bed;with call bell/phone within reach Nurse Communication: Mobility status;Other (comment);Need for lift equipment PT Visit Diagnosis: Difficulty in walking, not elsewhere classified (R26.2);Adult, failure to thrive (R62.7) Pain - Right/Left: Right Pain - part of body: Ankle and joints of foot     Time: 1320-1345 PT Time Calculation (min) (ACUTE ONLY): 25 min  Charges:    $Therapeutic Activity: 23-37 mins PT General Charges $$ ACUTE PT VISIT: 1 Visit                     Curtis Hill, PT Acute Rehabilitation Services Office:[phone] 09/07/2023    Curtis Hill 09/07/2023, 5:23 PM

## 2023-09-07 NOTE — Progress Notes (Signed)
 PROGRESS NOTE    Curtis Hill  GEX:[id number] DOB: November 18, 1945 DOA: 09/04/2023 PCP: Jimmey Mould, MD   Brief Narrative: Curtis Hill is a 78 y.o. male with a history of morbid obesity, hypertension, hyperlipidemia, aortic aneurysm.  Patient presented secondary to a fall with associated generalized weakness and found to have evidence of sepsis on admission presumed secondary to panniculitis. Empiric antibiotics started with improvement of infection.   Assessment and Plan:  Sepsis Present on admission. Secondary to cellulitis. Fevers with leukocytosis. Blood cultures obtained on admission. Empiric Vancomycin , Cefepime and Flagyl. Leukocytosis is improving with antibiotics.  Panniculitis Cellulitis Noted clinically and on CT imaging. Patient started empirically on antibiotics, in addition to nystatin. Wound care consulted. Improvement with Vancomycin  and Ceftriaxone. -Discontinue Vancomycin  and Ceftriaxone and start Doxycycline and Cefadroxil -Continue nystatin -CBC in AM  AKI Baseline creatinine from 2023 of around 1. Creatinine of 1.43 on admission. Improvement to 1.18, around baseline. AKI resolved.  Rhabdomyolysis Likely secondary to long lie after fall. Elevated CK of 990 on admission with associated elevated AST/ALT. CK improving with IV fluids.  Elevated AST/ALT In part secondary to rhabdomyolysis. CT imaging shows evidence of cirrhosis. AST/ALT stable.  Dyspnea Unclear etiology. Patient placed on oxygen overnight without hypoxia documented. Possibly related to underlying syndrome, ie. OHS/OSA.  -Check chest x-ray  Epigastric burning pain Present after eating. No significant history of GERD per pateint. Mostly has been eating while reclined. -Eat sitting upright -Protonix  Primary hypertension Patient is on valsartan as an outpatient which was held on admission. -Start irbesartan  (substituted for home valsartan)  Hyperlipidemia -Continue Lipitor  Iron  deficiency anemia Mild anemia. Stable.  Ascending aortic aneurysm Measuring 45 mm, up from 42 mm one year prior. Recommendation for semi-annual imaging with CTA or MRA in addition to cardiothoracic surgery referral.  Obesity, class III Estimated body mass index is 73.79 kg/m as calculated from the following:   Height as of this encounter: 5\' 7"  (1.702 m).   Weight as of this encounter: 213.7 kg.   DVT prophylaxis: Lovenox Code Status:   Code Status: Full Code Family Communication: None Disposition Plan: Discharge home likely in 24 hours if fever curve remains good   Consultants:  None  Procedures:  None  Antimicrobials: Vancomycin  Cefepime Flagyl    Subjective: Patient reports continued dyspnea overnight. Also reports reflux symptoms after eating. Afebrile for the last 24 hours  Objective: BP (!) 149/93 (BP Location: Right Wrist)   Pulse 93   Temp 98.4 F (36.9 C) (Oral)   Resp (!) 23   Ht 5\' 7"  (1.702 m)   Wt (!) 213.7 kg   SpO2 90%   BMI 73.79 kg/m   Examination:  General exam: Appears calm and comfortable Respiratory system: Clear to auscultation. Respiratory effort normal. Cardiovascular system: S1 & S2 heard, RRR. No murmurs, rubs, gallops or clicks. Gastrointestinal system: Abdomen is obese with large panus, soft and nontender. Normal bowel sounds heard. Central nervous system: Alert and oriented. No focal neurological deficits. Psychiatry: Judgement and insight appear normal. Mood & affect appropriate.     Data Reviewed: I have personally reviewed following labs and imaging studies  CBC Lab Results  Component Value Date   WBC 13.0 (H) 09/07/2023   RBC 3.75 (L) 09/07/2023   HGB 10.0 (L) 09/07/2023   HCT 32.4 (L) 09/07/2023   MCV 86.4 09/07/2023   MCH 26.7 09/07/2023   PLT 205 09/07/2023   MCHC 30.9 09/07/2023   RDW 15.7 (H) 09/07/2023  LYMPHSABS 0.9 09/06/2023   MONOABS 1.6 (H) 09/06/2023   EOSABS 0.0 09/06/2023   BASOSABS 0.0 09/06/2023      Last metabolic panel Lab Results  Component Value Date   NA 137 09/07/2023   K 4.1 09/07/2023   CL 104 09/07/2023   CO2 27 09/07/2023   BUN 21 09/07/2023   CREATININE 1.05 09/07/2023   GLUCOSE 115 (H) 09/07/2023   GFRNONAA >60 09/07/2023   GFRAA 89 05/23/2020   CALCIUM  8.3 (L) 09/07/2023   PHOS 2.7 09/05/2023   PROT 6.4 (L) 09/07/2023   ALBUMIN 2.4 (L) 09/07/2023   LABGLOB 2.9 05/23/2020   AGRATIO 1.5 05/23/2020   BILITOT 0.8 09/07/2023   ALKPHOS 57 09/07/2023   AST 71 (H) 09/07/2023   ALT 54 (H) 09/07/2023   ANIONGAP 6 09/07/2023    GFR: Estimated Creatinine Clearance: 104.3 mL/min (by C-G formula based on SCr of 1.05 mg/dL).  Recent Results (from the past 240 hours)  Blood Culture (routine x 2)     Status: None (Preliminary result)   Collection Time: 09/04/23  5:15 PM   Specimen: BLOOD  Result Value Ref Range Status   Specimen Description BLOOD SITE NOT SPECIFIED  Final   Special Requests   Final    BOTTLES DRAWN AEROBIC AND ANAEROBIC Blood Culture results may not be optimal due to an inadequate volume of blood received in culture bottles   Culture   Final    NO GROWTH 3 DAYS Performed at [facility], [address], [place], [place] [address]    Report Status PENDING  Incomplete  Resp panel by RT-PCR (RSV, Flu A&B, Covid) Anterior Nasal Swab     Status: None   Collection Time: 09/04/23  6:48 PM   Specimen: Anterior Nasal Swab  Result Value Ref Range Status   SARS Coronavirus 2 by RT PCR NEGATIVE NEGATIVE Final   Influenza A by PCR NEGATIVE NEGATIVE Final   Influenza B by PCR NEGATIVE NEGATIVE Final    Comment: (NOTE) The Xpert Xpress SARS-CoV-2/FLU/RSV plus assay is intended as an aid in the diagnosis of influenza from Nasopharyngeal swab specimens and should not be used as a sole basis for treatment. Nasal washings and aspirates are unacceptable for Xpert Xpress SARS-CoV-2/FLU/RSV testing.  Fact Sheet for  Patients: BloggerCourse.com  Fact Sheet for Healthcare Providers: SeriousBroker.it  This test is not yet approved or cleared by the [place]  FDA and has been authorized for detection and/or diagnosis of SARS-CoV-2 by FDA under an Emergency Use Authorization (EUA). This EUA will remain in effect (meaning this test can be used) for the duration of the COVID-19 declaration under Section 564(b)(1) of the Act, 21 U.S.C. section 360bbb-3(b)(1), unless the authorization is terminated or revoked.     Resp Syncytial Virus by PCR NEGATIVE NEGATIVE Final    Comment: (NOTE) Fact Sheet for Patients: BloggerCourse.com  Fact Sheet for Healthcare Providers: SeriousBroker.it  This test is not yet approved or cleared by the [place]  FDA and has been authorized for detection and/or diagnosis of SARS-CoV-2 by FDA under an Emergency Use Authorization (EUA). This EUA will remain in effect (meaning this test can be used) for the duration of the COVID-19 declaration under Section 564(b)(1) of the Act, 21 U.S.C. section 360bbb-3(b)(1), unless the authorization is terminated or revoked.  Performed at [facility], [address] [place], [place] [address]   MRSA Next Gen by PCR, Nasal     Status: None   Collection Time: 09/04/23  9:50 PM   Specimen: Nasal Mucosa; Nasal Swab  Result Value Ref Range Status   MRSA by PCR Next Gen NOT DETECTED NOT DETECTED Final    Comment: (NOTE) The GeneXpert MRSA Assay (FDA approved for NASAL specimens only), is one component of a comprehensive MRSA colonization surveillance program. It is not intended to diagnose MRSA infection nor to guide or monitor treatment for MRSA infections. Test performance is not FDA approved in patients less than 7 years old. Performed at [facility], [address] [place], [place] [address]   Blood Culture  (routine x 2)     Status: None (Preliminary result)   Collection Time: 09/05/23  3:46 AM   Specimen: BLOOD  Result Value Ref Range Status   Specimen Description BLOOD SITE NOT SPECIFIED  Final   Special Requests   Final    BOTTLES DRAWN AEROBIC ONLY Blood Culture results may not be optimal due to an inadequate volume of blood received in culture bottles   Culture   Final    NO GROWTH 2 DAYS Performed at [facility], [address], [place], [place] [address]    Report Status PENDING  Incomplete      Radiology Studies: No results found.     LOS: 2 days    Aneita Keens, MD [facility] 09/07/2023, 11:20 AM   If 7PM-7AM, please contact night-coverage [email or web address]

## 2023-09-07 NOTE — Progress Notes (Signed)
 Occupational Therapy Treatment Patient Details Name: Curtis Hill MRN: [id number] DOB: 16-Feb-1946 Today's Date: 09/07/2023   History of present illness Pt is a 78 yo male who presented to [facility] from home following fall when attempting to get up from toilet with pt requiring EMS assist. EMS found pt to be tachycardic and febrile. Pt with recent treatment for UTI and R foot ulcer. PMH: HTN, morbid obesity, OSA, thoracic aortic aneurysm, aortic stenosis, pulmonary hypertension   OT comments  Pt making gradual progress towards OT goals with today's session focusing on OOB transfers. Pt requires +3 assist for bed mobility to sit EOB (sleeps in lift chair at baseline) but once EOB, pt able to transfer to recliner with Min A  x 2 using bariatric RW. Pt limited by dizziness (with HR to 140s and BP elevated) and anxiety with mobility. [place]isky lift pad located and placed in chair for alternative for transfer back to bed.       If plan is discharge home, recommend the following:  Two people to help with walking and/or transfers;Assistance with cooking/housework;Assist for transportation;Help with stairs or ramp for entrance;A lot of help with bathing/dressing/bathroom   Equipment Recommendations  Other (comment) (bariatric BSC, bariatric wheelchair, cushion and elevating footrests)    Recommendations for Other Services      Precautions / Restrictions Precautions Precautions: Fall Precaution/Restrictions Comments: watch vitals, skin integrity issues around panus Restrictions Weight Bearing Restrictions Per Provider Order: No       Mobility Bed Mobility Overal bed mobility: Needs Assistance Bed Mobility: Supine to Sit     Supine to sit: Max assist (+3)     General bed mobility comments: Max A x 3 to sit EOB with assist needed for BLE and addtional person to assist in lifting trunk. Mattress deflated while sitting EOB    Transfers Overall transfer level: Needs assistance Equipment used:  Rolling walker (2 wheels) Transfers: Sit to/from Stand, Bed to chair/wheelchair/BSC Sit to Stand: Min assist, +2 physical assistance, +2 safety/equipment     Step pivot transfers: Min assist, +2 safety/equipment, +2 physical assistance, From elevated surface     General transfer comment: Min A x 2 to stand from bedside with RW, increased time to gain stance and place hand on RW. Initially reported unable to lift LLE but with increased time/effort, able to step to recliner with Min A x 2     Balance Overall balance assessment: Needs assistance Sitting-balance support: No upper extremity supported, Feet supported Sitting balance-Leahy Scale: Poor     Standing balance support: Bilateral upper extremity supported, During functional activity Standing balance-Leahy Scale: Poor                             ADL either performed or assessed with clinical judgement   ADL Overall ADL's : Needs assistance/impaired                     Lower Body Dressing: Total assistance;Bed level                 General ADL Comments: Focus on initial EOB/OOB transfers today    Extremity/Trunk Assessment Upper Extremity Assessment Upper Extremity Assessment: Right hand dominant;RUE deficits/detail;LUE deficits/detail RUE Deficits / Details: AROM shoulder flexion to approx. 100 degrees; generalized weakness; edematous throughout LUE Deficits / Details: AROM shoulder flexion to approx. 110 degrees; generalized weakness; bandaged around elbow (reports discomfort after xray tech assisted to pull trunk for  placement of board) edematous throughout   Lower Extremity Assessment Lower Extremity Assessment: Defer to PT evaluation        Vision   Vision Assessment?: No apparent visual deficits   Perception     Praxis     Communication Communication Communication: No apparent difficulties   Cognition Arousal: Alert Behavior During Therapy: WFL for tasks assessed/performed,  Anxious Cognition: No family/caregiver present to determine baseline     Awareness: Intellectual awareness intact, Online awareness impaired Memory impairment (select all impairments): Working memory Attention impairment (select first level of impairment): Selective attention, Alternating attention Executive functioning impairment (select all impairments): Problem solving, Reasoning, Organization OT - Cognition Comments: anxious with mobility, cues for sequencing, attention to task and safety                 Following commands: Impaired Following commands impaired: Only follows one step commands consistently, Follows multi-step commands inconsistently, Follows multi-step commands with increased time      Cueing   Cueing Techniques: Verbal cues, Gestural cues, Tactile cues, Visual cues  Exercises      Shoulder Instructions       General Comments BP elevated 180s/130s, HR to 140s with activity    Pertinent Vitals/ Pain       Pain Assessment Pain Assessment: Faces Faces Pain Scale: Hurts little more Pain Location: L arm Pain Descriptors / Indicators: Grimacing, Guarding Pain Intervention(s): Monitored during session  Home Living                                          Prior Functioning/Environment              Frequency  Min 2X/week        Progress Toward Goals  OT Goals(current goals can now be found in the care plan section)  Progress towards OT goals: Progressing toward goals  Acute Rehab OT Goals Patient Stated Goal: increase strength OT Goal Formulation: With patient Time For Goal Achievement: 09/19/23 Potential to Achieve Goals: Good ADL Goals Pt Will Perform Grooming: with contact guard assist;sitting Pt Will Perform Upper Body Bathing: with min assist;sitting Pt Will Perform Upper Body Dressing: with min assist;sitting Pt Will Transfer to Toilet: with mod assist;with +2 assist;bedside commode Pt Will Perform Toileting -  Clothing Manipulation and hygiene: with max assist;with 2+ total assist;sit to/from stand;sitting/lateral leans Pt/caregiver will Perform Home Exercise Program: Increased strength;Increased ROM;Both right and left upper extremity;With Supervision;With written HEP provided  Plan      Co-evaluation    PT/OT/SLP Co-Evaluation/Treatment: Yes Reason for Co-Treatment: Complexity of the patient's impairments (multi-system involvement);To address functional/ADL transfers;For patient/therapist safety   OT goals addressed during session: ADL's and self-care;Strengthening/ROM      AM-PAC OT "6 Clicks" Daily Activity     Outcome Measure   Help from another person eating meals?: A Little Help from another person taking care of personal grooming?: A Little Help from another person toileting, which includes using toliet, bedpan, or urinal?: Total Help from another person bathing (including washing, rinsing, drying)?: A Lot Help from another person to put on and taking off regular upper body clothing?: A Little Help from another person to put on and taking off regular lower body clothing?: Total 6 Click Score: 13    End of Session Equipment Utilized During Treatment: Rolling walker (2 wheels);Oxygen  OT Visit Diagnosis: Other abnormalities of gait and mobility (  R26.89);History of falling (Z91.81);Muscle weakness (generalized) (M62.81);Other (comment)   Activity Tolerance Patient tolerated treatment well   Patient Left in chair;with call bell/phone within reach;with chair alarm set   Nurse Communication Mobility status;Need for lift equipment        Time: 1191-4782 OT Time Calculation (min): 39 min  Charges: OT General Charges $OT Visit: 1 Visit OT Treatments $Therapeutic Activity: 23-37 mins  Lawrence Pretty, OTR/L Acute Rehab Services Office: [phone]   Shireen Dory 09/07/2023, 11:07 AM

## 2023-09-07 NOTE — Plan of Care (Signed)
  Problem: Fluid Volume: Goal: Hemodynamic stability will improve Outcome: Progressing   Problem: Clinical Measurements: Goal: Diagnostic test results will improve Outcome: Progressing Goal: Signs and symptoms of infection will decrease Outcome: Progressing   Problem: Pain Managment: Goal: General experience of comfort will improve and/or be controlled Outcome: Progressing

## 2023-09-07 NOTE — Progress Notes (Signed)
Placed patient on CPAP for the night with pressure set at 10cm and oxygen set at 2lpm  

## 2023-09-08 DIAGNOSIS — R6510 Systemic inflammatory response syndrome (SIRS) of non-infectious origin without acute organ dysfunction: Secondary | ICD-10-CM | POA: Diagnosis not present

## 2023-09-08 LAB — CBC
HCT: 32 % — ABNORMAL LOW (ref 39.0–52.0)
Hemoglobin: 10 g/dL — ABNORMAL LOW (ref 13.0–17.0)
MCH: 27.1 pg (ref 26.0–34.0)
MCHC: 31.3 g/dL (ref 30.0–36.0)
MCV: 86.7 fL (ref 80.0–100.0)
Platelets: 198 10*3/uL (ref 150–400)
RBC: 3.69 MIL/uL — ABNORMAL LOW (ref 4.22–5.81)
RDW: 15.6 % — ABNORMAL HIGH (ref 11.5–15.5)
WBC: 10.4 10*3/uL (ref 4.0–10.5)
nRBC: 0 % (ref 0.0–0.2)

## 2023-09-08 LAB — CK: Total CK: 367 U/L (ref 49–397)

## 2023-09-08 MED ORDER — SALINE SPRAY 0.65 % NA SOLN: 1.0000 | NASAL | Status: DC | PRN

## 2023-09-08 MED ORDER — LIDOCAINE VISCOUS HCL 2 % MT SOLN
15.0000 mL | Freq: Once | OROMUCOSAL | Status: AC
  Administered 2023-09-08: 15 mL via ORAL
  Filled 2023-09-08: qty 15

## 2023-09-08 MED ORDER — ATORVASTATIN CALCIUM 10 MG PO TABS
20.0000 mg | ORAL_TABLET | Freq: Every evening | ORAL | Status: DC
  Administered 2023-09-08 – 2023-09-12 (×5): 20 mg via ORAL
  Filled 2023-09-08 (×5): qty 2

## 2023-09-08 MED ORDER — ALUM & MAG HYDROXIDE-SIMETH 200-200-20 MG/5ML PO SUSP
30.0000 mL | Freq: Once | ORAL | Status: AC
  Administered 2023-09-08: 30 mL via ORAL
  Filled 2023-09-08: qty 30

## 2023-09-08 MED ORDER — CARVEDILOL 6.25 MG PO TABS
6.2500 mg | ORAL_TABLET | Freq: Two times a day (BID) | ORAL | Status: DC
  Administered 2023-09-08 – 2023-09-13 (×10): 6.25 mg via ORAL
  Filled 2023-09-08 (×10): qty 1

## 2023-09-08 MED ORDER — ALBUTEROL SULFATE (2.5 MG/3ML) 0.083% IN NEBU
2.5000 mg | INHALATION_SOLUTION | Freq: Three times a day (TID) | RESPIRATORY_TRACT | Status: DC
  Administered 2023-09-08: 2.5 mg via RESPIRATORY_TRACT
  Filled 2023-09-08: qty 3

## 2023-09-08 MED ORDER — TERBINAFINE HCL 250 MG PO TABS
250.0000 mg | ORAL_TABLET | Freq: Every day | ORAL | Status: DC
  Administered 2023-09-08 – 2023-09-13 (×6): 250 mg via ORAL
  Filled 2023-09-08 (×6): qty 1

## 2023-09-08 NOTE — Progress Notes (Signed)
 PROGRESS NOTE    Curtis Hill  NWG:[id number] DOB: [birth year] DOA: 09/04/2023 PCP: Jimmey Mould, MD   Brief Narrative: Curtis Hill is a 78 y.o. male with a history of morbid obesity, hypertension, hyperlipidemia, aortic aneurysm.  Patient presented secondary to a fall with associated generalized weakness and found to have evidence of sepsis on admission presumed secondary to panniculitis. Empiric antibiotics started with improvement of infection.   Assessment and Plan:  Sepsis Resolvedd Present on admission. Secondary to cellulitis. Fevers with leukocytosis. Blood cultures obtained on admission have had no growth to date. The patient was started on empiric Vancomycin , Cefepime and Flagyl. This has now been narrowed to cefadroxil and doxycycline. Leukocytosis has resolved with antibiotics.  Panniculitis Cellulitis Noted clinically and on CT imaging. Patient started empirically on antibiotics, in addition to nystatin. Wound care consulted. Improvement with Vancomycin  and Ceftriaxone. Patient is instructed to focus on keeping the area clean and dry.  -Discontinue Vancomycin  and Ceftriaxone and start Doxycycline and Cefadroxil -Continue nystatin -CBC in AM  AKI Baseline creatinine from 2023 of around 1. Creatinine of 1.43 on admission. Improvement to 1.05, around baseline. AKI resolved.  Rhabdomyolysis Resolved. Stop IV fluids.  Onychomycosis Severe especially on right foot. Will start PO lamisil. He will need to see a podiatrist as outpatient.  Elevated AST/ALT In part secondary to rhabdomyolysis. CT imaging shows evidence of cirrhosis. AST/ALT stable. Continue to monitor as the patient has been started on lamisil.  Dyspnea Due to [facility] and atelectasis. Pt is using CPAP as inpatient, but states that the mask does not work as well as his at home does. Incentive spirometry ordered.  Epigastric burning pain Present after eating. No significant history of GERD per pateint.  Mostly has been eating while reclined. -Eat sitting upright Pepcid  -Protonix -Maalox  Primary hypertension Patient is on valsartan as an outpatient which was held on admission. -Start irbesartan  (substituted for home valsartan)  Hyperlipidemia -Continue Lipitor  Iron deficiency anemia Mild anemia. Stable.  Ascending aortic aneurysm Measuring 45 mm, up from 42 mm one year prior. Recommendation for semi-annual imaging with CTA or MRA in addition to cardiothoracic surgery referral.  Obesity, class III Estimated body mass index is 75.72 kg/m as calculated from the following:   Height as of this encounter: 5\' 7"  (1.702 m).   Weight as of this encounter: 219.3 kg.   DVT prophylaxis: Lovenox Code Status:   Code Status: Full Code Family Communication: None Disposition Plan: Discharge home likely in 24 hours if fever curve remains good   Consultants:  None  Procedures:  None  Antimicrobials: Vancomycin  Cefepime Flagyl    Subjective: Patient reports continued dyspnea overnight. Also reports reflux symptoms after eating. Afebrile for the last 24 hours  Objective: BP (!) 145/111   Pulse 92   Temp 98.7 F (37.1 C)   Resp 20   Ht 5\' 7"  (1.702 m)   Wt (!) 219.3 kg   SpO2 97%   BMI 75.72 kg/m   Exam:  Constitutional:  The patient is awake, alert, and oriented x 3. No acute distress. Respiratory:  No increased work of breathing. No wheezes, rales, or rhonchi No tactile fremitus Cardiovascular:  Regular rate and rhythm No murmurs, ectopy, or gallups. No lateral PMI. No thrills. Abdomen:  Abdomen is morbidly obese. It is soft, non-tender, non-distended No hernias, masses, or organomegaly Bowel sounds are distant  Musculoskeletal:  No cyanosis or clubbing 2-3 pitting edema bilaterally Paronychia of great toe of right foot Sever  onychomycosis of right foot. Skin:  No rashes, lesions, ulcers palpation of skin: no induration or nodules Neurologic:  CN  2-12 intact Sensation all 4 extremities intact Psychiatric:  Mental status Mood, affect appropriate Orientation to person, place, time  judgment and insight appear intact    Data Reviewed: I have personally reviewed following labs and imaging studies  CBC Lab Results  Component Value Date   WBC 10.4 09/08/2023   RBC 3.69 (L) 09/08/2023   HGB 10.0 (L) 09/08/2023   HCT 32.0 (L) 09/08/2023   MCV 86.7 09/08/2023   MCH 27.1 09/08/2023   PLT 198 09/08/2023   MCHC 31.3 09/08/2023   RDW 15.6 (H) 09/08/2023   LYMPHSABS 0.9 09/06/2023   MONOABS 1.6 (H) 09/06/2023   EOSABS 0.0 09/06/2023   BASOSABS 0.0 09/06/2023     Last metabolic panel Lab Results  Component Value Date   NA 137 09/07/2023   K 4.1 09/07/2023   CL 104 09/07/2023   CO2 27 09/07/2023   BUN 21 09/07/2023   CREATININE 1.05 09/07/2023   GLUCOSE 115 (H) 09/07/2023   GFRNONAA >60 09/07/2023   GFRAA 89 05/23/2020   CALCIUM  8.3 (L) 09/07/2023   PHOS 2.7 09/05/2023   PROT 6.4 (L) 09/07/2023   ALBUMIN 2.4 (L) 09/07/2023   LABGLOB 2.9 05/23/2020   AGRATIO 1.5 05/23/2020   BILITOT 0.8 09/07/2023   ALKPHOS 57 09/07/2023   AST 71 (H) 09/07/2023   ALT 54 (H) 09/07/2023   ANIONGAP 6 09/07/2023    GFR: Estimated Creatinine Clearance: 106.2 mL/min (by C-G formula based on SCr of 1.05 mg/dL).  Recent Results (from the past 240 hours)  Blood Culture (routine x 2)     Status: None (Preliminary result)   Collection Time: 09/04/23  5:15 PM   Specimen: BLOOD  Result Value Ref Range Status   Specimen Description BLOOD SITE NOT SPECIFIED  Final   Special Requests   Final    BOTTLES DRAWN AEROBIC AND ANAEROBIC Blood Culture results may not be optimal due to an inadequate volume of blood received in culture bottles   Culture   Final    NO GROWTH 4 DAYS Performed at [facility], [address], [place], [place] [address]    Report Status PENDING  Incomplete  Resp panel by RT-PCR (RSV, Flu A&B, Covid) Anterior  Nasal Swab     Status: None   Collection Time: 09/04/23  6:48 PM   Specimen: Anterior Nasal Swab  Result Value Ref Range Status   SARS Coronavirus 2 by RT PCR NEGATIVE NEGATIVE Final   Influenza A by PCR NEGATIVE NEGATIVE Final   Influenza B by PCR NEGATIVE NEGATIVE Final    Comment: (NOTE) The Xpert Xpress SARS-CoV-2/FLU/RSV plus assay is intended as an aid in the diagnosis of influenza from Nasopharyngeal swab specimens and should not be used as a sole basis for treatment. Nasal washings and aspirates are unacceptable for Xpert Xpress SARS-CoV-2/FLU/RSV testing.  Fact Sheet for Patients: BloggerCourse.com  Fact Sheet for Healthcare Providers: SeriousBroker.it  This test is not yet approved or cleared by the [place]  FDA and has been authorized for detection and/or diagnosis of SARS-CoV-2 by FDA under an Emergency Use Authorization (EUA). This EUA will remain in effect (meaning this test can be used) for the duration of the COVID-19 declaration under Section 564(b)(1) of the Act, 21 U.S.C. section 360bbb-3(b)(1), unless the authorization is terminated or revoked.     Resp Syncytial Virus by PCR NEGATIVE NEGATIVE Final  Comment: (NOTE) Fact Sheet for Patients: BloggerCourse.com  Fact Sheet for Healthcare Providers: SeriousBroker.it  This test is not yet approved or cleared by the [place]  FDA and has been authorized for detection and/or diagnosis of SARS-CoV-2 by FDA under an Emergency Use Authorization (EUA). This EUA will remain in effect (meaning this test can be used) for the duration of the COVID-19 declaration under Section 564(b)(1) of the Act, 21 U.S.C. section 360bbb-3(b)(1), unless the authorization is terminated or revoked.  Performed at [facility], [address] [place], [place] [address]   MRSA Next Gen by PCR, Nasal     Status: None    Collection Time: 09/04/23  9:50 PM   Specimen: Nasal Mucosa; Nasal Swab  Result Value Ref Range Status   MRSA by PCR Next Gen NOT DETECTED NOT DETECTED Final    Comment: (NOTE) The GeneXpert MRSA Assay (FDA approved for NASAL specimens only), is one component of a comprehensive MRSA colonization surveillance program. It is not intended to diagnose MRSA infection nor to guide or monitor treatment for MRSA infections. Test performance is not FDA approved in patients less than 74 years old. Performed at [facility], [address] [place], [place] [address]   Blood Culture (routine x 2)     Status: None (Preliminary result)   Collection Time: 09/05/23  3:46 AM   Specimen: BLOOD  Result Value Ref Range Status   Specimen Description BLOOD SITE NOT SPECIFIED  Final   Special Requests   Final    BOTTLES DRAWN AEROBIC ONLY Blood Culture results may not be optimal due to an inadequate volume of blood received in culture bottles   Culture   Final    NO GROWTH 3 DAYS Performed at [facility], [address], [place], [place] [address]    Report Status PENDING  Incomplete      Radiology Studies: DG CHEST PORT 1 VIEW Result Date: 09/07/2023 CLINICAL DATA:  Dyspnea. EXAM: PORTABLE CHEST 1 VIEW COMPARISON:  09/04/2023. FINDINGS: Low lung volumes. Patient is rotated to the right. Stable cardiomegaly. The left lung base/costophrenic angle is not completely included within the field of view. Mild right basilar atelectasis. No focal consolidation, large pleural effusion, or pneumothorax. No acute osseous abnormality. IMPRESSION: Limited exam due to positioning and low lung volumes. Mild right basilar atelectasis. Otherwise, no acute cardiopulmonary findings. Electronically Signed   By: Mannie Seek M.D.   On: 09/07/2023 14:42       LOS: 3 days    Leza Apsey, DO [facility] 09/08/2023, 5:08 PM   If 7PM-7AM, please contact night-coverage [email or web address]

## 2023-09-08 NOTE — Plan of Care (Signed)

## 2023-09-08 NOTE — Progress Notes (Signed)
 PT BID Note  Patient progressing able to take more steps this pm to get from recliner to [facility] then from [facility] to bed.  Still with HR up to 130's and BP wildly elevated 150's/120's.  Respiratory status on 2L O2 stable.  Feel progressing though concern for transportation at d/c.  Also has 5 steps to enter home.  Potential needs ambulance transport.  PT will continue to follow.  Will need HHPT at d/c.     09/08/23 1748  PT Visit Information  Last PT Received On 09/08/23  Assistance Needed +2 (+3 for bed mobility)  History of Present Illness Pt is a 78 yo male who presented to [facility] from home following fall when attempting to get up from toilet with pt requiring EMS assist. EMS found pt to be tachycardic and febrile. Pt with recent treatment for UTI and R foot ulcer. PMH: HTN, morbid obesity, OSA, thoracic aortic aneurysm, aortic stenosis, pulmonary hypertension  Precautions  Precautions Fall  Recall of Precautions/Restrictions Intact  Precaution/Restrictions Comments watch vitals, skin integrity issues around panus  Pain Assessment  Pain Assessment Faces  Faces Pain Scale 4  Pain Location back with bed mobility  Pain Descriptors / Indicators Discomfort;Grimacing  Pain Intervention(s) Monitored during session;Repositioned  Cognition  Arousal Alert  Behavior During Therapy Anxious  PT - Cognitive impairments Safety/Judgement  Following Commands  Following commands Intact  Cueing  Cueing Techniques Verbal cues  Communication  Communication No apparent difficulties  Bed Mobility  Overal bed mobility Needs Assistance  Sit to supine +2 for physical assistance;Max assist;Used rails (+3 for safety with repositioning)  General bed mobility comments moved top rail down for pt to sit close to [facility] and A with pt arm around PT to keep shoulders to L while RN assist to lift legs and NT to reposition hips  Transfers  Overall transfer level Needs assistance  Equipment used Rolling walker (2 wheels)   Transfers Sit to/from Stand  Sit to Stand Min assist;+2 physical assistance  Step pivot transfers Min assist;+2 physical assistance (+1-2)  General transfer comment stepping about 3' to get from recliner to [facility] then BSC back to bed, cues for positioning and A for safety  Balance  Overall balance assessment Needs assistance  Sitting-balance support Feet supported  Sitting balance-Leahy Scale Fair  Standing balance support Bilateral upper extremity supported  Standing balance-Leahy Scale Poor  Standing balance comment reliant on UE support for balance  General Comments  General comments (skin integrity, edema, etc.) toileted on BSC and NT assisted for hygiene, pt with periwick though leaking and urine in the floor  PT - End of Session  Equipment Utilized During Treatment Oxygen  Activity Tolerance Patient limited by fatigue;Patient tolerated treatment well  Patient left in bed;with call bell/phone within reach;with nursing/sitter in room   PT - Assessment/Plan  PT Visit Diagnosis Other abnormalities of gait and mobility (R26.89);Muscle weakness (generalized) (M62.81)  PT Frequency (ACUTE ONLY) Min 2X/week  Follow Up Recommendations Home health PT (refused SNF)  Can patient physically be transported by private vehicle Yes  Patient can return home with the following A lot of help with walking and/or transfers;Assist for transportation;Help with stairs or ramp for entrance;Assistance with cooking/housework  PT equipment None recommended by PT  AM-PAC PT "6 Clicks" Mobility Outcome Measure (Version 2)  Help needed turning from your back to your side while in a flat bed without using bedrails? 2  Help needed moving from lying on your back to sitting on the side  of a flat bed without using bedrails? 1  Help needed moving to and from a bed to a chair (including a wheelchair)? 2  Help needed standing up from a chair using your arms (e.g., wheelchair or bedside chair)? 2  Help needed to walk in  hospital room? 1  Help needed climbing 3-5 steps with a railing?  1  6 Click Score 9  Consider Recommendation of Discharge To: CIR/SNF/LTACH  Progressive Mobility  What is the highest level of mobility based on the progressive mobility assessment? Level 4 (Walks with assist in room) - Balance while marching in place and cannot step forward and back - Complete  Activity Ambulated with assistance in room  PT Goal Progression  Progress towards PT goals Progressing toward goals  PT Time Calculation  PT Start Time (ACUTE ONLY) 1628  PT Stop Time (ACUTE ONLY) 1700  PT Time Calculation (min) (ACUTE ONLY) 32 min  PT General Charges  $$ ACUTE PT VISIT 1 Visit  PT Treatments  $Therapeutic Activity 23-37 mins   Abigail Hoff, PT Acute Rehabilitation Services Office:[phone] 09/08/2023

## 2023-09-08 NOTE — Progress Notes (Signed)
 Physical Therapy Treatment Patient Details Name: Curtis Hill MRN: [id number] DOB: April 02, 1946 Today's Date: 09/08/2023   History of Present Illness Pt is a 78 yo male who presented to [facility] from home following fall when attempting to get up from toilet with pt requiring EMS assist. EMS found pt to be tachycardic and febrile. Pt with recent treatment for UTI and R foot ulcer. PMH: HTN, morbid obesity, OSA, thoracic aortic aneurysm, aortic stenosis, pulmonary hypertension    PT Comments  Patient progressing with ease of sit to stand and stepping to get to recliner.  Still anxious and worried about new meds making him feel weaker.  Positioned with pillows under him in recliner to improve comfort.  PT will return for progressing out of recliner.     If plan is discharge home, recommend the following: A lot of help with walking and/or transfers;Assist for transportation;Help with stairs or ramp for entrance;Assistance with cooking/housework   Can travel by private vehicle     Yes  Equipment Recommendations  None recommended by PT    Recommendations for Other Services       Precautions / Restrictions Precautions Precautions: Fall Precaution/Restrictions Comments: watch vitals, skin integrity issues around panus     Mobility  Bed Mobility Overal bed mobility: Needs Assistance Bed Mobility: Supine to Sit     Supine to sit: +2 for physical assistance, HOB elevated, Used rails, Max assist     General bed mobility comments: +3 for scooting hips and lifting trunk    Transfers Overall transfer level: Needs assistance Equipment used: Rolling walker (2 wheels) Transfers: Sit to/from Stand Sit to Stand: Min assist, +2 physical assistance   Step pivot transfers: Min assist, +2 physical assistance       General transfer comment: assist from EOB to stand to walker for balance and safety, stepping to recliner with RW and A for safety    Ambulation/Gait                    Stairs             Wheelchair Mobility     Tilt Bed    Modified Rankin (Stroke Patients Only)       Balance Overall balance assessment: Needs assistance   Sitting balance-Leahy Scale: Fair     Standing balance support: Bilateral upper extremity supported Standing balance-Leahy Scale: Poor Standing balance comment: reliant on UE support for balance                            Communication Communication Communication: No apparent difficulties  Cognition Arousal: Alert Behavior During Therapy: Anxious   PT - Cognitive impairments: Safety/Judgement                         Following commands: Intact      Cueing Cueing Techniques: Verbal cues  Exercises      General Comments General comments (skin integrity, edema, etc.): HR 136 with mobility up to recliner; bed soiled with urine despite periwick      Pertinent Vitals/Pain Pain Assessment Pain Assessment: Faces Faces Pain Scale: Hurts little more Pain Location: back with bed mobility Pain Descriptors / Indicators: Discomfort, Grimacing Pain Intervention(s): Monitored during session, Repositioned    Home Living                          Prior Function  PT Goals (current goals can now be found in the care plan section) Progress towards PT goals: Progressing toward goals    Frequency    Min 2X/week      PT Plan      Co-evaluation              AM-PAC PT "6 Clicks" Mobility   Outcome Measure  Help needed turning from your back to your side while in a flat bed without using bedrails?: A Lot Help needed moving from lying on your back to sitting on the side of a flat bed without using bedrails?: Total Help needed moving to and from a bed to a chair (including a wheelchair)?: A Lot Help needed standing up from a chair using your arms (e.g., wheelchair or bedside chair)?: A Lot Help needed to walk in hospital room?: Total Help needed climbing  3-5 steps with a railing? : Total 6 Click Score: 9    End of Session   Activity Tolerance: Patient limited by fatigue Patient left: in chair;with call bell/phone within reach;with chair alarm set Nurse Communication: Mobility status PT Visit Diagnosis: Other abnormalities of gait and mobility (R26.89);Muscle weakness (generalized) (M62.81)     Time: 1610-9604 PT Time Calculation (min) (ACUTE ONLY): 33 min  Charges:    $Therapeutic Activity: 23-37 mins PT General Charges $$ ACUTE PT VISIT: 1 Visit                     Abigail Hoff, PT Acute Rehabilitation Services Office:[phone] 09/08/2023    Curtis Hill 09/08/2023, 5:45 PM

## 2023-09-09 DIAGNOSIS — R6510 Systemic inflammatory response syndrome (SIRS) of non-infectious origin without acute organ dysfunction: Secondary | ICD-10-CM | POA: Diagnosis not present

## 2023-09-09 LAB — CBC WITH DIFFERENTIAL/PLATELET
Abs Immature Granulocytes: 0.12 10*3/uL — ABNORMAL HIGH (ref 0.00–0.07)
Basophils Absolute: 0 10*3/uL (ref 0.0–0.1)
Basophils Relative: 0 %
Eosinophils Absolute: 0.2 10*3/uL (ref 0.0–0.5)
Eosinophils Relative: 1 %
HCT: 31 % — ABNORMAL LOW (ref 39.0–52.0)
Hemoglobin: 9.6 g/dL — ABNORMAL LOW (ref 13.0–17.0)
Immature Granulocytes: 1 %
Lymphocytes Relative: 10 %
Lymphs Abs: 1.1 10*3/uL (ref 0.7–4.0)
MCH: 26.9 pg (ref 26.0–34.0)
MCHC: 31 g/dL (ref 30.0–36.0)
MCV: 86.8 fL (ref 80.0–100.0)
Monocytes Absolute: 1.2 10*3/uL — ABNORMAL HIGH (ref 0.1–1.0)
Monocytes Relative: 12 %
Neutro Abs: 7.9 10*3/uL — ABNORMAL HIGH (ref 1.7–7.7)
Neutrophils Relative %: 76 %
Platelets: 250 10*3/uL (ref 150–400)
RBC: 3.57 MIL/uL — ABNORMAL LOW (ref 4.22–5.81)
RDW: 15.4 % (ref 11.5–15.5)
WBC: 10.5 10*3/uL (ref 4.0–10.5)
nRBC: 0 % (ref 0.0–0.2)

## 2023-09-09 LAB — BASIC METABOLIC PANEL WITH GFR
Anion gap: 6 (ref 5–15)
BUN: 24 mg/dL — ABNORMAL HIGH (ref 8–23)
CO2: 30 mmol/L (ref 22–32)
Calcium: 8.6 mg/dL — ABNORMAL LOW (ref 8.9–10.3)
Chloride: 102 mmol/L (ref 98–111)
Creatinine, Ser: 0.81 mg/dL (ref 0.61–1.24)
GFR, Estimated: >60 mL/min (ref >60)
Glucose, Bld: 110 mg/dL — ABNORMAL HIGH (ref 70–99)
Potassium: 4.3 mmol/L (ref 3.5–5.1)
Sodium: 138 mmol/L (ref 135–145)

## 2023-09-09 LAB — CULTURE, BLOOD (ROUTINE X 2): Culture: NO GROWTH

## 2023-09-09 LAB — CK: Total CK: 187 U/L (ref 49–397)

## 2023-09-09 MED ORDER — ALBUTEROL SULFATE (2.5 MG/3ML) 0.083% IN NEBU
2.5000 mg | INHALATION_SOLUTION | Freq: Two times a day (BID) | RESPIRATORY_TRACT | Status: DC
  Administered 2023-09-09 – 2023-09-10 (×3): 2.5 mg via RESPIRATORY_TRACT
  Filled 2023-09-09 (×3): qty 3

## 2023-09-09 MED ORDER — ALBUTEROL SULFATE (2.5 MG/3ML) 0.083% IN NEBU
2.5000 mg | INHALATION_SOLUTION | Freq: Three times a day (TID) | RESPIRATORY_TRACT | Status: DC
  Administered 2023-09-09: 2.5 mg via RESPIRATORY_TRACT
  Filled 2023-09-09: qty 3

## 2023-09-09 MED ORDER — JUVEN PO PACK
1.0000 | PACK | Freq: Two times a day (BID) | ORAL | Status: DC
  Administered 2023-09-10 – 2023-09-13 (×6): 1 via ORAL
  Filled 2023-09-09 (×6): qty 1

## 2023-09-09 NOTE — Progress Notes (Signed)
 PT BID Note  Patient progressing and able to stand from chair with 1 assist this pm and stepping to bed with cues and A for lines.  He maintained SpO2 90% on 2L O2 throughout.  Hopeful for home tomorrow per pt.     09/09/23 1804  PT Visit Information  Last PT Received On 09/09/23  Assistance Needed +2  History of Present Illness Pt is a 78 yo male who presented to [facility] from home following fall when attempting to get up from toilet with pt requiring EMS assist. EMS found pt to be tachycardic and febrile. Pt with recent treatment for UTI and R foot ulcer. PMH: HTN, morbid obesity, OSA, thoracic aortic aneurysm, aortic stenosis, pulmonary hypertension  Precautions  Precautions Fall  Recall of Precautions/Restrictions Intact  Precaution/Restrictions Comments watch vitals, skin integrity issues around panus  Restrictions  Weight Bearing Restrictions Per Provider Order No  Pain Assessment  Pain Assessment Faces  Faces Pain Scale 0  Cognition  Arousal Alert  Behavior During Therapy Anxious  PT - Cognitive impairments Safety/Judgement  Following Commands  Following commands Intact  Cueing  Cueing Techniques Verbal cues  Communication  Communication No apparent difficulties  Bed Mobility  Overal bed mobility Needs Assistance  Sit to supine +2 for physical assistance;Max assist;Used rails  General bed mobility comments  (pt put L arm over PT shoulder to keep shoulders to L and mobility specialist assisted wtih lifting legs onto bed; pt able to scoot up in bed pulling on headboard with bed in trendelenberg.)  Transfers  Overall transfer level Needs assistance  Equipment used Rolling walker (2 wheels)  Transfers Sit to/from Stand  Sit to Stand Min assist  Bed to/from chair/wheelchair/BSC transfer type: Step pivot  Step pivot transfers Min assist  General transfer comment moving to bed from recliner with A for safety and balance and line management  Balance  Overall balance assessment  Needs assistance  Sitting-balance support Feet supported  Sitting balance-Leahy Scale Fair  Sitting balance - Comments on edge of recliner chair able to sit unaided  Standing balance support Bilateral upper extremity supported;Reliant on assistive device for balance  Standing balance-Leahy Scale Poor  Standing balance comment reliant on UE support for balance  General Comments  General comments (skin integrity, edema, etc.) SpO2 90% on 2L O2 with transfer  Exercises  Exercises General Lower Extremity  General Exercises - Lower Extremity  Ankle Circles/Pumps Strengthening;5 reps;Supine;Both  Quad Sets Strengthening;Right;Supine;10 reps  Short [employer] Strengthening;Right;10 reps;Supine  PT - End of Session  Equipment Utilized During Treatment Oxygen  Activity Tolerance Patient tolerated treatment well  Patient left in bed;with call bell/phone within reach   PT - Assessment/Plan  PT Visit Diagnosis Other abnormalities of gait and mobility (R26.89);Muscle weakness (generalized) (M62.81)  PT Frequency (ACUTE ONLY) Min 2X/week  Follow Up Recommendations Home health PT  Can patient physically be transported by private vehicle No  Patient can return home with the following A lot of help with walking and/or transfers;Assist for transportation;Help with stairs or ramp for entrance;Assistance with cooking/housework  PT equipment None recommended by PT  AM-PAC PT "6 Clicks" Mobility Outcome Measure (Version 2)  Help needed turning from your back to your side while in a flat bed without using bedrails? 2  Help needed moving from lying on your back to sitting on the side of a flat bed without using bedrails? 1  Help needed moving to and from a bed to a chair (including a wheelchair)? 3  Help needed standing up from a chair using your arms (e.g., wheelchair or bedside chair)? 3  Help needed to walk in hospital room? 1  Help needed climbing 3-5 steps with a railing?  1  6 Click Score 11  Consider  Recommendation of Discharge To: CIR/SNF/LTACH  Progressive Mobility  What is the highest level of mobility based on the progressive mobility assessment? Level 3 (Stands with assist) - Balance while standing  and cannot march in place  Activity Transferred from chair to bed  PT Goal Progression  Progress towards PT goals Progressing toward goals  PT Time Calculation  PT Start Time (ACUTE ONLY) 1550  PT Stop Time (ACUTE ONLY) 1609  PT Time Calculation (min) (ACUTE ONLY) 19 min  PT General Charges  $$ ACUTE PT VISIT 1 Visit  PT Treatments  $Therapeutic Activity 8-22 mins  Abigail Hoff, PT Acute Rehabilitation Services [phone] 09/09/2023

## 2023-09-09 NOTE — Progress Notes (Signed)
 PROGRESS NOTE    Curtis Hill  MWU:[id number] DOB: 05-09-1945 DOA: 09/04/2023 PCP: Jimmey Mould, MD   Brief Narrative: Curtis Hill is a 78 y.o. male with a history of morbid obesity, hypertension, hyperlipidemia, aortic aneurysm.  Patient presented secondary to a fall with associated generalized weakness and found to have evidence of sepsis on admission presumed secondary to panniculitis. Empiric antibiotics started with improvement of infection.   Assessment and Plan:  Sepsis Resolvedd Present on admission. Secondary to cellulitis. Fevers with leukocytosis. Blood cultures obtained on admission have had no growth to date. The patient was started on empiric Vancomycin , Cefepime and Flagyl. This has now been narrowed to cefadroxil and doxycycline. Leukocytosis has resolved with antibiotics.  Panniculitis Cellulitis Noted clinically and on CT imaging. Patient started empirically on antibiotics, in addition to nystatin. Wound care consulted. Improvement with Vancomycin  and Ceftriaxone. Patient is instructed to focus on keeping the area clean and dry.  -Discontinue Vancomycin  and Ceftriaxone and start Doxycycline and Cefadroxil -Continue nystatin  AKI Baseline creatinine from 2023 of around 1. Creatinine of 1.43 on admission. Improvement to 1.05, around baseline. AKI resolved.  Rhabdomyolysis Resolved. Stop IV fluids.  Onychomycosis Severe especially on right foot. Will start PO lamisil. He will need to see a podiatrist as outpatient.  Elevated AST/ALT In part secondary to rhabdomyolysis. CT imaging shows evidence of cirrhosis. AST/ALT stable. Continue to monitor as the patient has been started on lamisil.  Dyspnea Due to [facility] and atelectasis. Pt is using CPAP as inpatient, but states that the mask does not work as well as his at home does. Incentive spirometry ordered.  Epigastric burning pain Present after eating. No significant history of GERD per pateint. Mostly has  been eating while reclined. -Eat sitting upright Pepcid  -Protonix -Maalox  Primary hypertension Patient is on valsartan as an outpatient which was held on admission. -Start irbesartan  (substituted for home valsartan)  Hyperlipidemia -Continue Lipitor  Iron deficiency anemia Mild anemia. Stable.  Ascending aortic aneurysm Measuring 45 mm, up from 42 mm one year prior. Recommendation for semi-annual imaging with CTA or MRA in addition to cardiothoracic surgery referral.  Obesity, class III Estimated body mass index is 75.02 kg/m as calculated from the following:   Height as of this encounter: 5\' 7"  (1.702 m).   Weight as of this encounter: 217.3 kg.   DVT prophylaxis: Lovenox Code Status:   Code Status: Full Code Family Communication: None Disposition Plan: Discharge home likely in 24 hours if fever curve remains good   Consultants:  None  Procedures:  None  Antimicrobials: Vancomycin  Cefepime Flagyl   Subjective: Patient reports continued dyspnea overnight. Also reports reflux symptoms after eating. Afebrile for the last 24 hours  Objective: BP (!) 156/88 (BP Location: Right Arm)   Pulse 78   Temp 97.8 F (36.6 C) (Oral)   Resp 18   Ht 5\' 7"  (1.702 m)   Wt (!) 217.3 kg   SpO2 96%   BMI 75.02 kg/m   Exam:  Constitutional:  The patient is awake, alert, and oriented x 3. No acute distress. Respiratory:  No increased work of breathing. No wheezes, rales, or rhonchi No tactile fremitus Cardiovascular:  Regular rate and rhythm No murmurs, ectopy, or gallups. No lateral PMI. No thrills. Abdomen:  Abdomen is morbidly obese. It is soft, non-tender, non-distended No hernias, masses, or organomegaly Bowel sounds are distant  Musculoskeletal:  No cyanosis or clubbing 2-3 pitting edema bilaterally Paronychia of great toe of right foot  Sever onychomycosis of right foot. Skin:  No rashes, lesions, ulcers palpation of skin: no induration or  nodules Neurologic:  CN 2-12 intact Sensation all 4 extremities intact Psychiatric:  Mental status Mood, affect appropriate Orientation to person, place, time  judgment and insight appear intact    Data Reviewed: I have personally reviewed following labs and imaging studies  CBC Lab Results  Component Value Date   WBC 10.5 09/09/2023   RBC 3.57 (L) 09/09/2023   HGB 9.6 (L) 09/09/2023   HCT 31.0 (L) 09/09/2023   MCV 86.8 09/09/2023   MCH 26.9 09/09/2023   PLT 250 09/09/2023   MCHC 31.0 09/09/2023   RDW 15.4 09/09/2023   LYMPHSABS 1.1 09/09/2023   MONOABS 1.2 (H) 09/09/2023   EOSABS 0.2 09/09/2023   BASOSABS 0.0 09/09/2023     Last metabolic panel Lab Results  Component Value Date   NA 138 09/09/2023   K 4.3 09/09/2023   CL 102 09/09/2023   CO2 30 09/09/2023   BUN 24 (H) 09/09/2023   CREATININE 0.81 09/09/2023   GLUCOSE 110 (H) 09/09/2023   GFRNONAA >60 09/09/2023   GFRAA 89 05/23/2020   CALCIUM  8.6 (L) 09/09/2023   PHOS 2.7 09/05/2023   PROT 6.4 (L) 09/07/2023   ALBUMIN 2.4 (L) 09/07/2023   LABGLOB 2.9 05/23/2020   AGRATIO 1.5 05/23/2020   BILITOT 0.8 09/07/2023   ALKPHOS 57 09/07/2023   AST 71 (H) 09/07/2023   ALT 54 (H) 09/07/2023   ANIONGAP 6 09/09/2023    GFR: Estimated Creatinine Clearance: 136.8 mL/min (by C-G formula based on SCr of 0.81 mg/dL).  Recent Results (from the past 240 hours)  Blood Culture (routine x 2)     Status: None   Collection Time: 09/04/23  5:15 PM   Specimen: BLOOD  Result Value Ref Range Status   Specimen Description BLOOD SITE NOT SPECIFIED  Final   Special Requests   Final    BOTTLES DRAWN AEROBIC AND ANAEROBIC Blood Culture results may not be optimal due to an inadequate volume of blood received in culture bottles   Culture   Final    NO GROWTH 5 DAYS Performed at [facility], [address] [place], [place] [address]    Report Status 09/09/2023 FINAL  Final  Resp panel by RT-PCR (RSV, Flu A&B, Covid)  Anterior Nasal Swab     Status: None   Collection Time: 09/04/23  6:48 PM   Specimen: Anterior Nasal Swab  Result Value Ref Range Status   SARS Coronavirus 2 by RT PCR NEGATIVE NEGATIVE Final   Influenza A by PCR NEGATIVE NEGATIVE Final   Influenza B by PCR NEGATIVE NEGATIVE Final    Comment: (NOTE) The Xpert Xpress SARS-CoV-2/FLU/RSV plus assay is intended as an aid in the diagnosis of influenza from Nasopharyngeal swab specimens and should not be used as a sole basis for treatment. Nasal washings and aspirates are unacceptable for Xpert Xpress SARS-CoV-2/FLU/RSV testing.  Fact Sheet for Patients: BloggerCourse.com  Fact Sheet for Healthcare Providers: SeriousBroker.it  This test is not yet approved or cleared by the [place]  FDA and has been authorized for detection and/or diagnosis of SARS-CoV-2 by FDA under an Emergency Use Authorization (EUA). This EUA will remain in effect (meaning this test can be used) for the duration of the COVID-19 declaration under Section 564(b)(1) of the Act, 21 U.S.C. section 360bbb-3(b)(1), unless the authorization is terminated or revoked.     Resp Syncytial Virus by PCR NEGATIVE NEGATIVE Final  Comment: (NOTE) Fact Sheet for Patients: BloggerCourse.com  Fact Sheet for Healthcare Providers: SeriousBroker.it  This test is not yet approved or cleared by the [place]  FDA and has been authorized for detection and/or diagnosis of SARS-CoV-2 by FDA under an Emergency Use Authorization (EUA). This EUA will remain in effect (meaning this test can be used) for the duration of the COVID-19 declaration under Section 564(b)(1) of the Act, 21 U.S.C. section 360bbb-3(b)(1), unless the authorization is terminated or revoked.  Performed at [facility], [address] [place], [place] [address]   MRSA Next Gen by PCR, Nasal     Status:  None   Collection Time: 09/04/23  9:50 PM   Specimen: Nasal Mucosa; Nasal Swab  Result Value Ref Range Status   MRSA by PCR Next Gen NOT DETECTED NOT DETECTED Final    Comment: (NOTE) The GeneXpert MRSA Assay (FDA approved for NASAL specimens only), is one component of a comprehensive MRSA colonization surveillance program. It is not intended to diagnose MRSA infection nor to guide or monitor treatment for MRSA infections. Test performance is not FDA approved in patients less than 19 years old. Performed at [facility], [address], [place], [place] [address]   Blood Culture (routine x 2)     Status: None (Preliminary result)   Collection Time: 09/05/23  3:46 AM   Specimen: BLOOD  Result Value Ref Range Status   Specimen Description BLOOD SITE NOT SPECIFIED  Final   Special Requests   Final    BOTTLES DRAWN AEROBIC ONLY Blood Culture results may not be optimal due to an inadequate volume of blood received in culture bottles   Culture   Final    NO GROWTH 4 DAYS Performed at [facility], [address] [place], [place] [address]    Report Status PENDING  Incomplete      Radiology Studies: No results found.      LOS: 4 days    Agamjot Kilgallon, DO [facility] 09/09/2023, 5:12 PM   If 7PM-7AM, please contact night-coverage [email or web address]

## 2023-09-09 NOTE — Progress Notes (Signed)
 SATURATION QUALIFICATIONS: (This note is used to comply with regulatory documentation for home oxygen)  Patient Saturations on Room Air at Rest = 89%  Patient Saturations on Room Air while Ambulating = 86%  Patient Saturations on 2 Liters of oxygen while Ambulating = 90%  Please briefly explain why patient needs home oxygen:patient with desaturation on RA with mobility and dyspneic.  Abigail Hoff, PT Acute Rehabilitation Services Office:[phone] 09/09/2023

## 2023-09-09 NOTE — Plan of Care (Signed)
  Problem: Clinical Measurements: Goal: Diagnostic test results will improve Outcome: Progressing   Problem: Respiratory: Goal: Ability to maintain adequate ventilation will improve Outcome: Progressing   Problem: Clinical Measurements: Goal: Diagnostic test results will improve Outcome: Progressing

## 2023-09-09 NOTE — Progress Notes (Signed)
 Physical Therapy Treatment Patient Details Name: Curtis Hill MRN: [id number] DOB: 1945-12-29 Today's Date: 09/09/2023   History of Present Illness Pt is a 78 yo male who presented to [facility] from home following fall when attempting to get up from toilet with pt requiring EMS assist. EMS found pt to be tachycardic and febrile. Pt with recent treatment for UTI and R foot ulcer. PMH: HTN, morbid obesity, OSA, thoracic aortic aneurysm, aortic stenosis, pulmonary hypertension    PT Comments  Patient progressing to ambulation in the room with RW and with less assistance for bed mobility and for moving in standing with walker.  Still needing lot of encouragement as initial refusal since "he is going home tomorrow".  Patient will benefit from HHPT at d/c.  PT to return for back to bed later today.     If plan is discharge home, recommend the following: A lot of help with walking and/or transfers;Assist for transportation;Help with stairs or ramp for entrance;Assistance with cooking/housework   Can travel by private vehicle     No  Equipment Recommendations  None recommended by PT    Recommendations for Other Services       Precautions / Restrictions Precautions Precautions: Fall Precaution/Restrictions Comments: watch vitals, skin integrity issues around panus     Mobility  Bed Mobility Overal bed mobility: Needs Assistance Bed Mobility: Supine to Sit Rolling: Mod assist   Supine to sit: HOB elevated, Used rails, Mod assist, +2 for physical assistance     General bed mobility comments: assist for moving legs off bed and lifting trunk    Transfers Overall transfer level: Needs assistance Equipment used: Rolling walker (2 wheels) Transfers: Sit to/from Stand Sit to Stand: Min assist                Ambulation/Gait Ambulation/Gait assistance: Min assist Gait Distance (Feet): 8 Feet Assistive device: Rolling walker (2 wheels) Gait Pattern/deviations: Step-to pattern,  Decreased stride length, Wide base of support       General Gait Details: after standing assist for repositioning pannus out from between his legs, pt able to walk to closet door then turn around and walked to chair then turned to sit   Stairs             Wheelchair Mobility     Tilt Bed    Modified Rankin (Stroke Patients Only)       Balance Overall balance assessment: Needs assistance Sitting-balance support: Feet supported Sitting balance-Leahy Scale: Fair     Standing balance support: Bilateral upper extremity supported, Reliant on assistive device for balance Standing balance-Leahy Scale: Poor Standing balance comment: reliant on UE support for balance                            Communication Communication Communication: No apparent difficulties  Cognition Arousal: Alert Behavior During Therapy: Anxious   PT - Cognitive impairments: Safety/Judgement                       PT - Cognition Comments: not wanting to get up since he is going home tomorrow.  Originally refused despite encouragement then later changed his mind Following commands: Intact      Cueing    Exercises      General Comments General comments (skin integrity, edema, etc.): HR 133 with ambulation, on RA for initial ambulation and SpO2 drop to 86% with pt dyspneic reapplied O2 and back up  to 91%      Pertinent Vitals/Pain Pain Assessment Pain Assessment: Faces Faces Pain Scale: No hurt    Home Living                          Prior Function            PT Goals (current goals can now be found in the care plan section) Progress towards PT goals: Progressing toward goals    Frequency    Min 2X/week      PT Plan      Co-evaluation              AM-PAC PT "6 Clicks" Mobility   Outcome Measure  Help needed turning from your back to your side while in a flat bed without using bedrails?: A Lot Help needed moving from lying on your back  to sitting on the side of a flat bed without using bedrails?: Total Help needed moving to and from a bed to a chair (including a wheelchair)?: A Little Help needed standing up from a chair using your arms (e.g., wheelchair or bedside chair)?: A Little Help needed to walk in hospital room?: Total Help needed climbing 3-5 steps with a railing? : Total 6 Click Score: 11    End of Session Equipment Utilized During Treatment: Oxygen Activity Tolerance: Patient limited by fatigue Patient left: in chair;with call bell/phone within reach   PT Visit Diagnosis: Other abnormalities of gait and mobility (R26.89);Muscle weakness (generalized) (M62.81)     Time: 0454-0981 PT Time Calculation (min) (ACUTE ONLY): 30 min  Charges:    $Gait Training: 8-22 mins $Therapeutic Activity: 8-22 mins PT General Charges $$ ACUTE PT VISIT: 1 Visit                     Abigail Hoff, PT Acute Rehabilitation Services [phone] 09/09/2023    [name] 09/09/2023, 6:00 PM

## 2023-09-09 NOTE — Progress Notes (Addendum)
  Curtis Hill, PT  Physical Therapist Physical Therapy   Progress Notes    Signed   Date of Service: 09/09/2023  3:29 PM   Signed      SATURATION QUALIFICATIONS: (This note is used to comply with regulatory documentation for home oxygen)   Patient Saturations on Room Air at Rest = 89%   Patient Saturations on Room Air while Ambulating = 86%   Patient Saturations on 2 Liters of oxygen while Ambulating = 90%   Please briefly explain why patient needs home oxygen:patient with desaturation on RA with mobility and dyspneic.   Abigail Hoff, PT Acute Rehabilitation Services [phone] 09/09/2023            Durable Medical Equipment (From admission, onward)        Start     Ordered  09/09/23 1621  For home use only DME oxygen  Once      Question Answer Comment Length of Need Lifetime  Mode or (Route) Nasal cannula  Liters per Minute 2  Frequency Continuous (stationary and portable oxygen unit needed)  Oxygen delivery system Gas    09/09/23 1620

## 2023-09-09 NOTE — TOC Progression Note (Addendum)
 Transition of Care [facility]) - Progression Note    Patient Details  Name: Curtis Hill MRN: [id number] Date of Birth: Apr 20, 1945  [facility] CM/SW Contact  Jeani Mill, RN Phone Number: 09/09/2023, 4:02 PM  Clinical Narrative:    Pieter Bride to patient at bedside regarding discharge. Patient is agreeable to home health with [facility]. [facility] can accept.  Bedside RN, Bernell Brigham, will teach family and patient wound care.  Patient needs PTAR transport at discharge.  PTAR papers on hard chart.  [employer], Jermaine,  notified of home 02 needs.   Expected Discharge Plan: Home w Home Health Services Barriers to Discharge: Continued Medical Work up  Expected Discharge Plan and Services   Discharge Planning Services: CM Consult Post Acute Care Choice: Home Health, Durable Medical Equipment Living arrangements for the past 2 months: Single Family Home                           HH Arranged: RN, PT, OT, Nurse's Aide HH Agency: Other - See comment, [facility] Date [facility] Agency Contacted: 09/09/23 Time HH Agency Contacted: 1601 Representative spoke with at [facility] Agency: Howell Macintosh   Social Determinants of Health (SDOH) Interventions SDOH Screenings   Food Insecurity: No Food Insecurity (09/07/2023)  Housing: Low Risk  (09/07/2023)  Transportation Needs: Unmet Transportation Needs (09/07/2023)  Utilities: Not At Risk (09/07/2023)  Depression (PHQ2-9): Medium Risk (05/23/2020)  Social Connections: Socially Integrated (09/07/2023)  Tobacco Use: Low Risk  (09/04/2023)    Readmission Risk Interventions     No data to display

## 2023-09-10 DIAGNOSIS — R6510 Systemic inflammatory response syndrome (SIRS) of non-infectious origin without acute organ dysfunction: Secondary | ICD-10-CM | POA: Diagnosis not present

## 2023-09-10 LAB — CBC WITH DIFFERENTIAL/PLATELET
Abs Immature Granulocytes: 0.19 10*3/uL — ABNORMAL HIGH (ref 0.00–0.07)
Basophils Absolute: 0.1 10*3/uL (ref 0.0–0.1)
Basophils Relative: 0 %
Eosinophils Absolute: 0.3 10*3/uL (ref 0.0–0.5)
Eosinophils Relative: 2 %
HCT: 35.5 % — ABNORMAL LOW (ref 39.0–52.0)
Hemoglobin: 10.9 g/dL — ABNORMAL LOW (ref 13.0–17.0)
Immature Granulocytes: 2 %
Lymphocytes Relative: 10 %
Lymphs Abs: 1.1 10*3/uL (ref 0.7–4.0)
MCH: 27.3 pg (ref 26.0–34.0)
MCHC: 30.7 g/dL (ref 30.0–36.0)
MCV: 89 fL (ref 80.0–100.0)
Monocytes Absolute: 1.1 10*3/uL — ABNORMAL HIGH (ref 0.1–1.0)
Monocytes Relative: 10 %
Neutro Abs: 9 10*3/uL — ABNORMAL HIGH (ref 1.7–7.7)
Neutrophils Relative %: 76 %
Platelets: 252 10*3/uL (ref 150–400)
RBC: 3.99 MIL/uL — ABNORMAL LOW (ref 4.22–5.81)
RDW: 15.4 % (ref 11.5–15.5)
WBC: 11.7 10*3/uL — ABNORMAL HIGH (ref 4.0–10.5)
nRBC: 0 % (ref 0.0–0.2)

## 2023-09-10 LAB — CULTURE, BLOOD (ROUTINE X 2): Culture: NO GROWTH

## 2023-09-10 LAB — CK: Total CK: 106 U/L (ref 49–397)

## 2023-09-10 LAB — BASIC METABOLIC PANEL WITH GFR
Anion gap: 9 (ref 5–15)
BUN: 24 mg/dL — ABNORMAL HIGH (ref 8–23)
CO2: 30 mmol/L (ref 22–32)
Calcium: 8.7 mg/dL — ABNORMAL LOW (ref 8.9–10.3)
Chloride: 100 mmol/L (ref 98–111)
Creatinine, Ser: 0.84 mg/dL (ref 0.61–1.24)
GFR, Estimated: >60 mL/min (ref >60)
Glucose, Bld: 110 mg/dL — ABNORMAL HIGH (ref 70–99)
Potassium: 4 mmol/L (ref 3.5–5.1)
Sodium: 139 mmol/L (ref 135–145)

## 2023-09-10 NOTE — Progress Notes (Signed)
 PROGRESS NOTE    Curtis Hill  ZOX:[id number] DOB: 11-19-1945 DOA: 09/04/2023 PCP: Jimmey Mould, MD   Brief Narrative: Curtis Hill is a 78 y.o. male with a history of morbid obesity, hypertension, hyperlipidemia, aortic aneurysm.  Patient presented secondary to a fall with associated generalized weakness and found to have evidence of sepsis on admission presumed secondary to panniculitis. Empiric antibiotics started with improvement of infection.  The patient has worked with PT, but has had difficulty getting out of bed even with their help. He has required quite a bit of help just to get up to the chair and back into bed. Initially the patient had wanted to discharge to home with home health. He has now thought better of it. He wants to go to rehab. TOC has been consulted. Assessment and Plan:  Sepsis Resolved Present on admission. Secondary to cellulitis. Fevers with leukocytosis. Blood cultures obtained on admission have had no growth to date. The patient was started on empiric Vancomycin , Cefepime and Flagyl. This has now been narrowed to cefadroxil and doxycycline. Leukocytosis has resolved with antibiotics.  Panniculitis Cellulitis Noted clinically and on CT imaging. Patient started empirically on antibiotics, in addition to nystatin. Wound care consulted. Improvement with Vancomycin  and Ceftriaxone. Patient is instructed to focus on keeping the area clean and dry.  -Discontinue Vancomycin  and Ceftriaxone and start Doxycycline and Cefadroxil -Continue nystatin  AKI Baseline creatinine from 2023 of around 1. Creatinine of 1.43 on admission. Improvement to 0.84, around baseline. AKI resolved.  Rhabdomyolysis Resolved. Stop IV fluids.  Onychomycosis Severe especially on right foot. Will start PO lamisil. He will need to see a podiatrist as outpatient.  Elevated AST/ALT In part secondary to rhabdomyolysis. Likely also due to steatohepatitis. Patient is aware of this  diagnosis. CT imaging shows evidence of cirrhosis that is stable from prior exam. AST/ALT stable. Continue to monitor as the patient has been started on lamisil.  Dyspnea Due to [facility] and atelectasis. Pt is using CPAP as inpatient, but states that the mask does not work as well as his at home does. Incentive spirometry ordered.  Epigastric burning pain Present after eating. No significant history of GERD per pateint. Mostly has been eating while reclined. -Eat sitting upright Pepcid  -Protonix -Maalox  Primary hypertension Patient is on valsartan as an outpatient which was held on admission. -Start irbesartan  (substituted for home valsartan)  Hyperlipidemia -Continue Lipitor  Iron deficiency anemia Mild anemia. Stable.  Ascending aortic aneurysm Measuring 45 mm, up from 42 mm one year prior. Recommendation for semi-annual imaging with CTA or MRA in addition to cardiothoracic surgery referral.  Obesity, class III Estimated body mass index is 75.07 kg/m as calculated from the following:   Height as of this encounter: 5\' 7"  (1.702 m).   Weight as of this encounter: 217.4 kg.   DVT prophylaxis: Lovenox Code Status:   Code Status: Full Code Family Communication: None Disposition Plan: Discharge home likely in 24 hours if fever curve remains good   Consultants:  Wound care  Procedures:  None  Antimicrobials: Vancomycin  Cefepime Flagyl   Subjective: Patient reports continued dyspnea overnight. Also reports reflux symptoms after eating. Afebrile for the last 24 hours  Objective: BP (!) 166/88 (BP Location: Right Arm)   Pulse 73   Temp 98 F (36.7 C)   Resp 17   Ht 5\' 7"  (1.702 m)   Wt (!) 217.4 kg   SpO2 97%   BMI 75.07 kg/m   Exam:  Constitutional:  The patient is awake, alert, and oriented x 3. No acute distress. Respiratory:  No increased work of breathing. No wheezes, rales, or rhonchi No tactile fremitus Cardiovascular:  Regular rate and rhythm No  murmurs, ectopy, or gallups. No lateral PMI. No thrills. Abdomen:  Abdomen is morbidly obese. It is soft, non-tender, non-distended No hernias, masses, or organomegaly Bowel sounds are distant  Musculoskeletal:  No cyanosis or clubbing 2-3 pitting edema bilaterally Paronychia of great toe of right foot Sever onychomycosis of right foot. Skin:  No rashes, lesions, ulcers palpation of skin: no induration or nodules Erythema underneath lower pannus and in groins. Neurologic:  CN 2-12 intact Sensation all 4 extremities intact Psychiatric:  Mental status Mood, affect appropriate Orientation to person, place, time  judgment and insight appear intact  Data Reviewed: I have personally reviewed following labs and imaging studies  CBC Lab Results  Component Value Date   WBC 11.7 (H) 09/10/2023   RBC 3.99 (L) 09/10/2023   HGB 10.9 (L) 09/10/2023   HCT 35.5 (L) 09/10/2023   MCV 89.0 09/10/2023   MCH 27.3 09/10/2023   PLT 252 09/10/2023   MCHC 30.7 09/10/2023   RDW 15.4 09/10/2023   LYMPHSABS 1.1 09/10/2023   MONOABS 1.1 (H) 09/10/2023   EOSABS 0.3 09/10/2023   BASOSABS 0.1 09/10/2023     Last metabolic panel Lab Results  Component Value Date   NA 139 09/10/2023   K 4.0 09/10/2023   CL 100 09/10/2023   CO2 30 09/10/2023   BUN 24 (H) 09/10/2023   CREATININE 0.84 09/10/2023   GLUCOSE 110 (H) 09/10/2023   GFRNONAA >60 09/10/2023   GFRAA 89 05/23/2020   CALCIUM  8.7 (L) 09/10/2023   PHOS 2.7 09/05/2023   PROT 6.4 (L) 09/07/2023   ALBUMIN 2.4 (L) 09/07/2023   LABGLOB 2.9 05/23/2020   AGRATIO 1.5 05/23/2020   BILITOT 0.8 09/07/2023   ALKPHOS 57 09/07/2023   AST 71 (H) 09/07/2023   ALT 54 (H) 09/07/2023   ANIONGAP 9 09/10/2023    GFR: Estimated Creatinine Clearance: 131.9 mL/min (by C-G formula based on SCr of 0.84 mg/dL).  Recent Results (from the past 240 hours)  Blood Culture (routine x 2)     Status: None   Collection Time: 09/04/23  5:15 PM   Specimen:  BLOOD  Result Value Ref Range Status   Specimen Description BLOOD SITE NOT SPECIFIED  Final   Special Requests   Final    BOTTLES DRAWN AEROBIC AND ANAEROBIC Blood Culture results may not be optimal due to an inadequate volume of blood received in culture bottles   Culture   Final    NO GROWTH 5 DAYS Performed at [facility], [address], [place], [place] [address]    Report Status 09/09/2023 FINAL  Final  Resp panel by RT-PCR (RSV, Flu A&B, Covid) Anterior Nasal Swab     Status: None   Collection Time: 09/04/23  6:48 PM   Specimen: Anterior Nasal Swab  Result Value Ref Range Status   SARS Coronavirus 2 by RT PCR NEGATIVE NEGATIVE Final   Influenza A by PCR NEGATIVE NEGATIVE Final   Influenza B by PCR NEGATIVE NEGATIVE Final    Comment: (NOTE) The Xpert Xpress SARS-CoV-2/FLU/RSV plus assay is intended as an aid in the diagnosis of influenza from Nasopharyngeal swab specimens and should not be used as a sole basis for treatment. Nasal washings and aspirates are unacceptable for Xpert Xpress SARS-CoV-2/FLU/RSV testing.  Fact Sheet for Patients: BloggerCourse.com  Fact Sheet for Healthcare Providers: SeriousBroker.it  This test is not yet approved or cleared by the [place]  FDA and has been authorized for detection and/or diagnosis of SARS-CoV-2 by FDA under an Emergency Use Authorization (EUA). This EUA will remain in effect (meaning this test can be used) for the duration of the COVID-19 declaration under Section 564(b)(1) of the Act, 21 U.S.C. section 360bbb-3(b)(1), unless the authorization is terminated or revoked.     Resp Syncytial Virus by PCR NEGATIVE NEGATIVE Final    Comment: (NOTE) Fact Sheet for Patients: BloggerCourse.com  Fact Sheet for Healthcare Providers: SeriousBroker.it  This test is not yet approved or cleared by the [place]  FDA  and has been authorized for detection and/or diagnosis of SARS-CoV-2 by FDA under an Emergency Use Authorization (EUA). This EUA will remain in effect (meaning this test can be used) for the duration of the COVID-19 declaration under Section 564(b)(1) of the Act, 21 U.S.C. section 360bbb-3(b)(1), unless the authorization is terminated or revoked.  Performed at [facility], [address], [place], [place] [address]   MRSA Next Gen by PCR, Nasal     Status: None   Collection Time: 09/04/23  9:50 PM   Specimen: Nasal Mucosa; Nasal Swab  Result Value Ref Range Status   MRSA by PCR Next Gen NOT DETECTED NOT DETECTED Final    Comment: (NOTE) The GeneXpert MRSA Assay (FDA approved for NASAL specimens only), is one component of a comprehensive MRSA colonization surveillance program. It is not intended to diagnose MRSA infection nor to guide or monitor treatment for MRSA infections. Test performance is not FDA approved in patients less than 61 years old. Performed at [facility], [address], [place], [place] [address]   Blood Culture (routine x 2)     Status: None   Collection Time: 09/05/23  3:46 AM   Specimen: BLOOD  Result Value Ref Range Status   Specimen Description BLOOD SITE NOT SPECIFIED  Final   Special Requests   Final    BOTTLES DRAWN AEROBIC ONLY Blood Culture results may not be optimal due to an inadequate volume of blood received in culture bottles   Culture   Final    NO GROWTH 5 DAYS Performed at [facility], [address], [place], [place] [address]    Report Status 09/10/2023 FINAL  Final      Radiology Studies: No results found.      LOS: 5 days    Curtis Wickey, DO [facility] 09/10/2023, 5:08 PM   If 7PM-7AM, please contact night-coverage [email or web address]

## 2023-09-10 NOTE — NC FL2 (Signed)
 [place]  MEDICAID FL2 LEVEL OF CARE FORM     IDENTIFICATION  Patient Name: Curtis Hill Birthdate: [birth year] Sex: male Admission Date (Current Location): 09/04/2023  [facility] and [place] Number:  Producer, television/film/video and Address:  [facility] [place], 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   Peripheral edema 12/07/2020   B12 deficiency 11/14/2020   Essential hypertension 08/15/2020   Visceral obesity 07/29/2020   Prediabetes 06/20/2020   Vitamin D deficiency 05/23/2020   Hyperglycemia 05/23/2020   Shortness of breath on exertion 05/23/2020   Debility 05/23/2020   Nutrition impaired due to imbalance of nutrients 05/23/2020   Depression screening 05/23/2020   Thoracic ascending aortic aneurysm (HCC) 11/22/2019   Panniculitis 10/31/2019   History of colonic polyps 02/27/2019   Pulmonary HTN (HCC) 11/02/2018   CAD (coronary artery disease) 10/20/2018   Aortic stenosis 07/19/2018   Hyperlipidemia LDL goal <70 07/19/2018   Hyperlipidemia    Coronary artery calcification seen on CAT scan 03/17/2018   Benign essential HTN 03/17/2018   Thoracic aortic aneurysm (TAA) (HCC) 03/17/2018   Benign neoplasm of colon 03/28/2012   BMI 70 and over, adult (HCC) 05/18/2007   ANEMIA, IRON DEFICIENCY 05/18/2007   Leukocytosis 05/18/2007   Disease of blood and blood  forming organ 05/18/2007   CARDIOMEGALY 05/18/2007   SINUSITIS, ACUTE 05/18/2007   Diaphragmatic hernia 05/18/2007   Abdominal pain 05/18/2007   EPIGASTRIC PAIN 05/18/2007   GOUT, HX OF 05/18/2007    Orientation RESPIRATION BLADDER Height & Weight     Time, Self, Place, Situation  O2 (2L [place] O2) External catheter, Incontinent Weight: (!) 479 lb 4.5 oz (217.4 kg) Height:  5\' 7"  (170.2 cm)  BEHAVIORAL SYMPTOMS/MOOD NEUROLOGICAL BOWEL NUTRITION STATUS      Continent Diet (see dc summary)  AMBULATORY STATUS COMMUNICATION OF NEEDS Skin   Extensive Assist Verbally Other (Comment) (Wound / Incision- Puncture Toe (Comment which one) Anterior;Right Black, eroded nail; Wound / Incision Irritant Dermatitis (Moisture Associated Skin Damage) Abdomen Right;Left;Mid MASD under pannus; Wound / Incision Arm Anterior;Left weeping blisters)                       Personal Care Assistance Level of Assistance  Bathing, Feeding, Dressing Bathing Assistance: Maximum assistance Feeding assistance: Limited assistance Dressing Assistance: Maximum assistance     Functional Limitations Info  Sight, Hearing, Speech Sight Info: Impaired (eyeglasses) Hearing Info: Adequate Speech Info: Adequate    SPECIAL CARE FACTORS FREQUENCY  PT (By licensed PT), OT (By licensed OT)     PT Frequency: 5x week OT Frequency: 5x week            Contractures Contractures Info: Not present    Additional Factors Info  Code Status, Allergies Code Status Info: Full Allergies Info: Levofloxacin, Lisinopril, Prednisone   Current Medications (09/10/2023):  This is the current hospital active medication list Current Facility-Administered Medications  Medication Dose Route Frequency Provider Last Rate Last Admin   (feeding supplement) PROSource Plus liquid 30 mL  30 mL Oral BID BM Verlyn Goad, MD   30 mL at 09/10/23 0955   acetaminophen  (TYLENOL ) tablet 650 mg  650 mg Oral Q6H PRN Doutova, Anastassia,  MD   650 mg at 09/06/23 0453   Or   acetaminophen  (TYLENOL ) suppository 650 mg  650 mg Rectal Q6H PRN Doutova, Anastassia, MD       albuterol  (PROVENTIL ) (2.5 MG/3ML) 0.083% nebulizer solution 2.5 mg  2.5 mg Nebulization BID Swayze, Ava, DO   2.5 mg at 09/10/23 0757   aspirin  EC tablet 81 mg  81 mg Oral Daily Doutova, Anastassia, MD   81 mg at 09/10/23 0954   atorvastatin  (LIPITOR) tablet 20 mg  20 mg Oral QPM Swayze, Ava, DO   20 mg at 09/09/23 1641   bismuth subsalicylate (PEPTO BISMOL) 262 MG/15ML suspension 30 mL  30 mL Oral TID AC & HS Sundil, Subrina, MD   30 mL at 09/10/23 0955   carvedilol (COREG) tablet 6.25 mg  6.25 mg Oral BID WC Swayze, Ava, DO   6.25 mg at 09/10/23 0954   cefadroxil (DURICEF) capsule 500 mg  500 mg Oral BID Verlyn Goad, MD   500 mg at 09/10/23 0954   doxycycline (VIBRA-TABS) tablet 100 mg  100 mg Oral Q12H Verlyn Goad, MD   100 mg at 09/10/23 0954   enoxaparin (LOVENOX) injection 100 mg  100 mg Subcutaneous Q24H Verlyn Goad, MD   100 mg at 09/09/23 1638   famotidine  (PEPCID ) tablet 10 mg  10 mg Oral Daily Verlyn Goad, MD   10 mg at 09/10/23 0954   fluticasone (FLONASE) 50 MCG/ACT nasal spray 1 spray  1 spray Each Nare Daily Verlyn Goad, MD   1 spray at 09/10/23 0955   HYDROcodone-acetaminophen  (NORCO/VICODIN) 5-325 MG per tablet 1-2 tablet  1-2 tablet Oral Q4H PRN Doutova, Anastassia, MD   2 tablet at 09/09/23 2347   irbesartan  (AVAPRO ) tablet 300 mg  300 mg Oral Daily Verlyn Goad, MD   300 mg at 09/10/23 0954   multivitamin with minerals tablet 1 tablet  1 tablet Oral Daily Verlyn Goad, MD   1 tablet at 09/10/23 7829   nutrition supplement (JUVEN) (JUVEN) powder packet 1 packet  1 packet Oral BID BM Swayze, Ava, DO   1 packet at 09/10/23 0954   nystatin (MYCOSTATIN/NYSTOP) topical powder   Topical TID Doutova, Anastassia, MD   Given at 09/10/23 0955   ondansetron  (ZOFRAN ) tablet 4 mg  4 mg Oral Q6H PRN Doutova, Anastassia, MD   4 mg at  09/05/23 1210   Or   ondansetron  (ZOFRAN ) injection 4 mg  4 mg Intravenous Q6H PRN Doutova, Anastassia, MD   4 mg at 09/05/23 0148   Oral care mouth rinse  15 mL Mouth Rinse PRN Verlyn Goad, MD       pantoprazole (PROTONIX) EC tablet 40 mg  40 mg Oral Daily Verlyn Goad, MD   40 mg at 09/10/23 0954   protein supplement (ENSURE MAX) liquid  11 oz Oral Daily Verlyn Goad, MD   11 oz at 09/09/23 2037   sodium chloride  (OCEAN) 0.65 % nasal spray 1 spray  1 spray Each Nare PRN Swayze, Ava, DO  terbinafine (LAMISIL) tablet 250 mg  250 mg Oral Daily Swayze, Ava, DO   250 mg at 09/10/23 0954   traMADol (ULTRAM) tablet 50 mg  50 mg Oral Q6H PRN Doutova, Anastassia, MD   50 mg at 09/05/23 2225     Discharge Medications: Please see discharge summary for a list of discharge medications.  Relevant Imaging Results:  Relevant Lab Results:   Additional Information SSN [id number]; needs bariatric bed; has home CPAP  Melodye Swor C Jiali Linney, LCSWA

## 2023-09-10 NOTE — Plan of Care (Signed)
  Problem: Clinical Measurements: Goal: Diagnostic test results will improve Outcome: Progressing   Problem: Clinical Measurements: Goal: Ability to maintain clinical measurements within normal limits will improve Outcome: Progressing Goal: Diagnostic test results will improve Outcome: Progressing   Problem: Pain Managment: Goal: General experience of comfort will improve and/or be controlled Outcome: Progressing

## 2023-09-10 NOTE — Progress Notes (Signed)
 PT Note Addendum:  Patient seen for past three days and progression from max A of 3 to mod A of 2 for up to EOB and able to walk about 8' in the room yesterday with min a with RW.  Now agreeable to inpatient rehab (<3 hours/day).  Feel patient will benefit for further activity progression and safety.  Abigail Hoff, PT Acute Rehabilitation Services Office:[phone] 09/10/2023

## 2023-09-10 NOTE — TOC Progression Note (Addendum)
 Transition of Care [facility]) - Progression Note    Patient Details  Name: Curtis Hill MRN: [id number] Date of Birth: Nov 18, 1945  [facility] CM/SW Contact  Arron Big, [place] Phone Number: 09/10/2023, 10:58 AM  Clinical Narrative:   Patient notified bedside RN to notify CSW that he is agreeable to going to SNF for STR. CSW inquired with patient about having a home CPAP machine, which he does. Patient stated CSW can call his spouse or son to bring the machine to the hospital; CSW called spouse, Monroe [place], and she stated she will tell her son to bring the CPAP machine. CSW to send referrals. Patient has traditional medicare, no pre auth required.   3:39 PM CSW provided patient with medicare.gov ratings for accepting bed offers. Patient is considering going to [facility] but has not made a final decision. CSW will follow up at a later time regarding bed choice.   TOC will continue to follow.    Expected Discharge Plan: Skilled Nursing Facility Barriers to Discharge: Continued Medical Work up, Other (must enter comment), SNF Pending bed offer (Needs home CPAP  brought to hospital or facility)  Expected Discharge Plan and Services   Discharge Planning Services: CM Consult Post Acute Care Choice: Home Health, Durable Medical Equipment Living arrangements for the past 2 months: Single Family Home                           HH Arranged: RN, PT, OT, Nurse's Aide HH Agency: Other - See comment, [facility] Date [facility] Agency Contacted: 09/09/23 Time HH Agency Contacted: 1601 Representative spoke with at [facility] Agency: Howell Macintosh   Social Determinants of Health (SDOH) Interventions SDOH Screenings   Food Insecurity: No Food Insecurity (09/07/2023)  Housing: Low Risk  (09/07/2023)  Transportation Needs: Unmet Transportation Needs (09/07/2023)  Utilities: Not At Risk (09/07/2023)  Depression (PHQ2-9): Medium Risk (05/23/2020)  Social Connections: Socially Integrated (09/07/2023)   Tobacco Use: Low Risk  (09/04/2023)    Readmission Risk Interventions     No data to display

## 2023-09-10 NOTE — Progress Notes (Signed)
 Occupational Therapy Treatment Patient Details Name: Curtis Hill MRN: [id number] DOB: [birth year] Today's Date: 09/10/2023   History of present illness Pt is a 78 yo male who presented to [facility] from home following fall when attempting to get up from toilet with pt requiring EMS assist. EMS found pt to be tachycardic and febrile. Pt with recent treatment for UTI and R foot ulcer. PMH: HTN, morbid obesity, OSA, thoracic aortic aneurysm, aortic stenosis, pulmonary hypertension   OT comments  Pt. Seen for skilled OT treatment session. RN/CNA assisted with bed mobility for clean up after dislodged male purewik.  MAX/TOTAL A for rolling L/R. Pt. Assisting with use of grab bars and assisted with pulling up in bed using head board and BUEs.  Pt. Declines eob/oob today stating he needs to rest BLEs.  Agreeable to introduction and completion of HEP for BUEs with theraband.  Good return demo and technique.  Pt. States he is going <3hrs/day skilled therapies prior to home. SW visiting with pt. At end of session reviewing and beginning the process for pt.        If plan is discharge home, recommend the following:  Two people to help with walking and/or transfers;Assistance with cooking/housework;Assist for transportation;Help with stairs or ramp for entrance;A lot of help with bathing/dressing/bathroom   Equipment Recommendations       Recommendations for Other Services      Precautions / Restrictions Precautions Precautions: Fall Recall of Precautions/Restrictions: Intact Precaution/Restrictions Comments: watch vitals, skin integrity issues around panus       Mobility Bed Mobility                    Transfers                         Balance                                           ADL either performed or assessed with clinical judgement   ADL Overall ADL's : Needs assistance/impaired             Lower Body Bathing: Total assistance;+2 for  physical assistance;+2 for safety/equipment;Bed level;Cueing for sequencing;Cueing for compensatory techniques Lower Body Bathing Details (indicate cue type and reason): male pure wik had come dislodged and pt. required A for clean up, (cna and rn assisted) Upper Body Dressing : Minimal assistance;Bed level Upper Body Dressing Details (indicate cue type and reason): to donn/doff gown                   General ADL Comments: pt. declined eob/oob today "im going to rest my legs today" agreeable to introduction and review of BUE HEP with theraband    Extremity/Trunk Assessment              Vision       Perception     Praxis     Communication     Cognition Arousal: Alert Behavior During Therapy: [facility] for tasks assessed/performed Cognition: No family/caregiver present to determine baseline                               Following commands: Intact Following commands impaired: Only follows one step commands consistently      Cueing   Cueing Techniques: Verbal cues  Exercises General Exercises - Upper Extremity Shoulder Flexion: AROM, Both, 10 reps, Theraband Theraband Level (Shoulder Flexion): Level 2 (Red) Shoulder Extension: AROM, Both, 10 reps, Theraband Theraband Level (Shoulder Extension): Level 2 (Red) Shoulder ABduction: AROM, Both, 10 reps, Theraband Theraband Level (Shoulder Abduction): Level 2 (Red) Shoulder ADduction: AROM, Both, 10 reps, Theraband Theraband Level (Shoulder Adduction): Level 2 (Red) Elbow Flexion: AROM, Both, 10 reps, Theraband Theraband Level (Elbow Flexion): Level 2 (Red) Elbow Extension: AROM, Both, 10 reps, Theraband Theraband Level (Elbow Extension): Level 2 (Red) Other Exercises Other Exercises: pt. provided level II red theraband for introduction of BUE HEP. pt. able to anchor one end with opposing UE and pull with the other.  good tech. able to complete exercises listed on previous portion and also chest as well.   reviewed not going past point of pain, taking rest breaks prn, and attempting completion 1-3x a day    Shoulder Instructions       General Comments      Pertinent Vitals/ Pain       Pain Assessment Pain Assessment: No/denies pain  Home Living                                          Prior Functioning/Environment              Frequency  Min 2X/week        Progress Toward Goals  OT Goals(current goals can now be found in the care plan section)  Progress towards OT goals: Progressing toward goals     Plan      Co-evaluation                 AM-PAC OT "6 Clicks" Daily Activity     Outcome Measure   Help from another person eating meals?: A Little Help from another person taking care of personal grooming?: A Little Help from another person toileting, which includes using toliet, bedpan, or urinal?: Total Help from another person bathing (including washing, rinsing, drying)?: A Lot Help from another person to put on and taking off regular upper body clothing?: A Little Help from another person to put on and taking off regular lower body clothing?: Total 6 Click Score: 13    End of Session    OT Visit Diagnosis: Other abnormalities of gait and mobility (R26.89);History of falling (Z91.81);Muscle weakness (generalized) (M62.81);Other (comment)   Activity Tolerance Patient tolerated treatment well   Patient Left in bed;with call bell/phone within reach   Nurse Communication Other (comment) (rn present during 1st part of session, and CNA also present and assisted with clean up and new placement of male purewik)        Time: 6578-4696 OT Time Calculation (min): 49 min  Charges: OT General Charges $OT Visit: 1 Visit OT Treatments $Self Care/Home Management : 8-22 mins $Therapeutic Exercise: 23-37 mins  Howell Macintosh, COTA/L Acute Rehabilitation [phone]   Leory Rands Lorraine-COTA/L  09/10/2023, 10:57 AM

## 2023-09-10 NOTE — Plan of Care (Signed)

## 2023-09-11 DIAGNOSIS — R6510 Systemic inflammatory response syndrome (SIRS) of non-infectious origin without acute organ dysfunction: Secondary | ICD-10-CM | POA: Diagnosis not present

## 2023-09-11 LAB — CK: Total CK: 88 U/L (ref 49–397)

## 2023-09-11 MED ORDER — SIMETHICONE 80 MG PO CHEW
160.0000 mg | CHEWABLE_TABLET | Freq: Four times a day (QID) | ORAL | Status: DC | PRN
  Administered 2023-09-11 – 2023-09-12 (×3): 160 mg via ORAL
  Filled 2023-09-11 (×3): qty 2

## 2023-09-11 MED ORDER — ALBUTEROL SULFATE (2.5 MG/3ML) 0.083% IN NEBU
2.5000 mg | INHALATION_SOLUTION | Freq: Four times a day (QID) | RESPIRATORY_TRACT | Status: DC | PRN
  Filled 2023-09-11: qty 3

## 2023-09-11 NOTE — Progress Notes (Signed)
 PROGRESS NOTE    Curtis Hill  ZOX:[id number] DOB: 1945/05/12 DOA: 09/04/2023 PCP: Jimmey Mould, MD   Brief Narrative: Curtis Hill is a 78 y.o. male with a history of morbid obesity, hypertension, hyperlipidemia, aortic aneurysm.  Patient presented secondary to a fall with associated generalized weakness and found to have evidence of sepsis on admission presumed secondary to panniculitis. Empiric antibiotics started with improvement of infection.  The patient has worked with PT, but has had difficulty getting out of bed even with their help. He has required quite a bit of help just to get up to the chair and back into bed. Initially the patient had wanted to discharge to home with home health. He has now thought better of it. He wants to go to rehab. TOC has been consulted.  The patient is complaining of GI distention and gas. He is asking for simethicone , which was ordered. The patient is also encouraged to get out of bed and sit up in a chair and work with PT. The patient states that he would rather not do that today due to the difficulties in getting in and out of bed even with the assistance of PT. I advised him that this would only get worse with staying in bed.   TOC has been consulted for placement as the patient has decided that he cannot handle going home on his own.   Assessment and Plan:  Sepsis Resolved Present on admission. Secondary to cellulitis. Fevers with leukocytosis. Blood cultures obtained on admission have had no growth to date. The patient was started on empiric Vancomycin , Cefepime  and Flagyl . This has now been narrowed to cefadroxil  and doxycycline . Leukocytosis has resolved with antibiotics.  Panniculitis Cellulitis Noted clinically and on CT imaging. Patient started empirically on antibiotics, in addition to nystatin . Wound care consulted. Improvement with Vancomycin  and Ceftriaxone . Patient is instructed to focus on keeping the area clean and dry.   -Discontinued Vancomycin  and Ceftriaxone  and started Doxycycline  and Cefadroxil  -Continue nystatin   AKI Baseline creatinine from 2023 of around 1. Creatinine of 1.43 on admission. Improvement to 0.84, around baseline. AKI resolved.  Rhabdomyolysis Resolved. Stop IV fluids.  Onychomycosis Severe especially on right foot. Will start PO lamisil . He will need to see a podiatrist as outpatient.  Elevated AST/ALT In part secondary to rhabdomyolysis. Likely also due to steatohepatitis. Patient is aware of this diagnosis. CT imaging shows evidence of cirrhosis that is stable from prior exam. AST/ALT stable. Continue to monitor as the patient has been started on lamisil .  Dyspnea Due to [facility] and atelectasis. Pt is using CPAP as inpatient, but states that the mask does not work as well as his at home does. Incentive spirometry ordered.  Epigastric burning pain/Gaseous distention Present after eating. No significant history of GERD per pateint. Mostly has been eating while reclined. Simethicone  ordered. Patient encouraged to get up out of bed. -Eat sitting upright Pepcid  -Protonix  -Maalox  Primary hypertension Patient is on valsartan as an outpatient which was held on admission. -Start irbesartan  (substituted for home valsartan)  Hyperlipidemia -Continue Lipitor  Iron deficiency anemia Mild anemia. Stable.  Ascending aortic aneurysm Measuring 45 mm, up from 42 mm one year prior. Recommendation for semi-annual imaging with CTA or MRA in addition to cardiothoracic surgery referral.  Obesity, class III Estimated body mass index is 75.07 kg/m as calculated from the following:   Height as of this encounter: 5\' 7"  (1.702 m).   Weight as of this encounter: 217.4 kg.  DVT prophylaxis: Lovenox  Code Status:   Code Status: Full Code Family Communication: None Disposition Plan: Discharge home likely in 24 hours if fever curve remains good   Consultants:  Wound care  Procedures:   None  Antimicrobials: Vancomycin  Cefepime  Flagyl    Subjective: Patient reports continued dyspnea overnight. Also reports reflux symptoms after eating. Afebrile for the last 24 hours  Objective: BP (!) 163/84 (BP Location: Right Arm)   Pulse 74   Temp 98.1 F (36.7 C) (Oral)   Resp (!) 21   Ht 5\' 7"  (1.702 m)   Wt (!) 217.4 kg   SpO2 95%   BMI 75.07 kg/m   Exam:  Constitutional:  The patient is awake, alert, and oriented x 3. No acute distress. Respiratory:  No increased work of breathing. No wheezes, rales, or rhonchi No tactile fremitus Cardiovascular:  Regular rate and rhythm No murmurs, ectopy, or gallups. No lateral PMI. No thrills. Abdomen:  Abdomen is morbidly obese. It is soft, non-tender, non-distended No hernias, masses, or organomegaly Bowel sounds are distant  Musculoskeletal:  No cyanosis or clubbing 2-3 pitting edema bilaterally Paronychia of great toe of right foot Sever onychomycosis of right foot. Skin:  No rashes, lesions, ulcers palpation of skin: no induration or nodules Erythema underneath lower pannus and in groins. Neurologic:  CN 2-12 intact Sensation all 4 extremities intact Psychiatric:  Mental status Mood, affect appropriate Orientation to person, place, time  judgment and insight appear intact  Data Reviewed: I have personally reviewed following labs and imaging studies  CBC Lab Results  Component Value Date   WBC 11.7 (H) 09/10/2023   RBC 3.99 (L) 09/10/2023   HGB 10.9 (L) 09/10/2023   HCT 35.5 (L) 09/10/2023   MCV 89.0 09/10/2023   MCH 27.3 09/10/2023   PLT 252 09/10/2023   MCHC 30.7 09/10/2023   RDW 15.4 09/10/2023   LYMPHSABS 1.1 09/10/2023   MONOABS 1.1 (H) 09/10/2023   EOSABS 0.3 09/10/2023   BASOSABS 0.1 09/10/2023     Last metabolic panel Lab Results  Component Value Date   NA 139 09/10/2023   K 4.0 09/10/2023   CL 100 09/10/2023   CO2 30 09/10/2023   BUN 24 (H) 09/10/2023   CREATININE 0.84  09/10/2023   GLUCOSE 110 (H) 09/10/2023   GFRNONAA >60 09/10/2023   GFRAA 89 05/23/2020   CALCIUM  8.7 (L) 09/10/2023   PHOS 2.7 09/05/2023   PROT 6.4 (L) 09/07/2023   ALBUMIN 2.4 (L) 09/07/2023   LABGLOB 2.9 05/23/2020   AGRATIO 1.5 05/23/2020   BILITOT 0.8 09/07/2023   ALKPHOS 57 09/07/2023   AST 71 (H) 09/07/2023   ALT 54 (H) 09/07/2023   ANIONGAP 9 09/10/2023    GFR: Estimated Creatinine Clearance: 131.9 mL/min (by C-G formula based on SCr of 0.84 mg/dL).  Recent Results (from the past 240 hours)  Blood Culture (routine x 2)     Status: None   Collection Time: 09/04/23  5:15 PM   Specimen: BLOOD  Result Value Ref Range Status   Specimen Description BLOOD SITE NOT SPECIFIED  Final   Special Requests   Final    BOTTLES DRAWN AEROBIC AND ANAEROBIC Blood Culture results may not be optimal due to an inadequate volume of blood received in culture bottles   Culture   Final    NO GROWTH 5 DAYS Performed at [facility], [address], [place], [place] [address]    Report Status 09/09/2023 FINAL  Final  Resp panel by  RT-PCR (RSV, Flu A&B, Covid) Anterior Nasal Swab     Status: None   Collection Time: 09/04/23  6:48 PM   Specimen: Anterior Nasal Swab  Result Value Ref Range Status   SARS Coronavirus 2 by RT PCR NEGATIVE NEGATIVE Final   Influenza A by PCR NEGATIVE NEGATIVE Final   Influenza B by PCR NEGATIVE NEGATIVE Final    Comment: (NOTE) The Xpert Xpress SARS-CoV-2/FLU/RSV plus assay is intended as an aid in the diagnosis of influenza from Nasopharyngeal swab specimens and should not be used as a sole basis for treatment. Nasal washings and aspirates are unacceptable for Xpert Xpress SARS-CoV-2/FLU/RSV testing.  Fact Sheet for Patients: BloggerCourse.com  Fact Sheet for Healthcare Providers: SeriousBroker.it  This test is not yet approved or cleared by the [place]  FDA and has been authorized for detection  and/or diagnosis of SARS-CoV-2 by FDA under an Emergency Use Authorization (EUA). This EUA will remain in effect (meaning this test can be used) for the duration of the COVID-19 declaration under Section 564(b)(1) of the Act, 21 U.S.C. section 360bbb-3(b)(1), unless the authorization is terminated or revoked.     Resp Syncytial Virus by PCR NEGATIVE NEGATIVE Final    Comment: (NOTE) Fact Sheet for Patients: BloggerCourse.com  Fact Sheet for Healthcare Providers: SeriousBroker.it  This test is not yet approved or cleared by the [place]  FDA and has been authorized for detection and/or diagnosis of SARS-CoV-2 by FDA under an Emergency Use Authorization (EUA). This EUA will remain in effect (meaning this test can be used) for the duration of the COVID-19 declaration under Section 564(b)(1) of the Act, 21 U.S.C. section 360bbb-3(b)(1), unless the authorization is terminated or revoked.  Performed at [facility], [address] [place], [place] [address]   MRSA Next Gen by PCR, Nasal     Status: None   Collection Time: 09/04/23  9:50 PM   Specimen: Nasal Mucosa; Nasal Swab  Result Value Ref Range Status   MRSA by PCR Next Gen NOT DETECTED NOT DETECTED Final    Comment: (NOTE) The GeneXpert MRSA Assay (FDA approved for NASAL specimens only), is one component of a comprehensive MRSA colonization surveillance program. It is not intended to diagnose MRSA infection nor to guide or monitor treatment for MRSA infections. Test performance is not FDA approved in patients less than 32 years old. Performed at [facility], [address], [place], [place] [address]   Blood Culture (routine x 2)     Status: None   Collection Time: 09/05/23  3:46 AM   Specimen: BLOOD  Result Value Ref Range Status   Specimen Description BLOOD SITE NOT SPECIFIED  Final   Special Requests   Final    BOTTLES DRAWN AEROBIC ONLY Blood Culture  results may not be optimal due to an inadequate volume of blood received in culture bottles   Culture   Final    NO GROWTH 5 DAYS Performed at [facility], [address] [place], [place] [address]    Report Status 09/10/2023 FINAL  Final      Radiology Studies: No results found.      LOS: 6 days    Wells Gerdeman, DO [facility] 09/11/2023, 3:57 PM   If 7PM-7AM, please contact night-coverage [email or web address]

## 2023-09-11 NOTE — Plan of Care (Signed)
 Pt A/Ox4, VSS. Pt weaned from 2L [place] to 1L throughout shift. Wound care completed. 1 PIV in place; access for IV ABX. SCDs placed on BLE and incentive spirometer given to patient. Pt confirmed understanding of IS and used it multiple times on the hour when awake. Low intake, adequate output. No c/o pain. Planning to d/c to a rehab facility when ready.   Problem: Fluid Volume: Goal: Hemodynamic stability will improve Outcome: Progressing   Problem: Clinical Measurements: Goal: Diagnostic test results will improve Outcome: Progressing Goal: Signs and symptoms of infection will decrease Outcome: Progressing   Problem: Respiratory: Goal: Ability to maintain adequate ventilation will improve Outcome: Progressing   Problem: Education: Goal: Knowledge of General Education information will improve Description: Including pain rating scale, medication(s)/side effects and non-pharmacologic comfort measures Outcome: Progressing   Problem: Health Behavior/Discharge Planning: Goal: Ability to manage health-related needs will improve Outcome: Progressing   Problem: Clinical Measurements: Goal: Ability to maintain clinical measurements within normal limits will improve Outcome: Progressing Goal: Will remain free from infection Outcome: Progressing Goal: Diagnostic test results will improve Outcome: Progressing Goal: Respiratory complications will improve Outcome: Progressing Goal: Cardiovascular complication will be avoided Outcome: Progressing   Problem: Activity: Goal: Risk for activity intolerance will decrease Outcome: Progressing   Problem: Nutrition: Goal: Adequate nutrition will be maintained Outcome: Progressing   Problem: Coping: Goal: Level of anxiety will decrease Outcome: Progressing   Problem: Elimination: Goal: Will not experience complications related to bowel motility Outcome: Progressing Goal: Will not experience complications related to urinary retention Outcome:  Progressing   Problem: Pain Managment: Goal: General experience of comfort will improve and/or be controlled Outcome: Progressing   Problem: Safety: Goal: Ability to remain free from injury will improve Outcome: Progressing   Problem: Skin Integrity: Goal: Risk for impaired skin integrity will decrease Outcome: Progressing

## 2023-09-11 NOTE — TOC Progression Note (Signed)
 Transition of Care [facility]) - Progression Note    Patient Details  Name: Curtis Hill MRN: [id number] Date of Birth: 08-20-1945  [facility] of [facility] CM/SW Contact  Arron Big, [place] Phone Number: 09/11/2023, 10:01 AM  Clinical Narrative:   CSW met patient at bedside to ask about SNF decision. Patient is leaning towards [facility] but stated his son is going to visit the facilities today. CSW will follow up with patient at a later time.   TOC will continue to follow.     Expected Discharge Plan: Skilled Nursing Facility Barriers to Discharge: Continued Medical Work up, Other (must enter comment), SNF Pending bed offer (Needs home CPAP  brought to hospital or facility)  Expected Discharge Plan and Services   Discharge Planning Services: CM Consult Post Acute Care Choice: Home Health, Durable Medical Equipment Living arrangements for the past 2 months: Single Family Home                           HH Arranged: RN, PT, OT, Nurse's Aide HH Agency: Other - See comment, [facility] Date [facility] Agency Contacted: 09/09/23 Time HH Agency Contacted: 1601 Representative spoke with at [facility] Agency: Howell Macintosh   Social Determinants of Health (SDOH) Interventions SDOH Screenings   Food Insecurity: No Food Insecurity (09/07/2023)  Housing: Low Risk  (09/07/2023)  Transportation Needs: Unmet Transportation Needs (09/07/2023)  Utilities: Not At Risk (09/07/2023)  Depression (PHQ2-9): Medium Risk (05/23/2020)  Social Connections: Socially Integrated (09/07/2023)  Tobacco Use: Low Risk  (09/04/2023)    Readmission Risk Interventions     No data to display

## 2023-09-12 DIAGNOSIS — R6510 Systemic inflammatory response syndrome (SIRS) of non-infectious origin without acute organ dysfunction: Secondary | ICD-10-CM | POA: Diagnosis not present

## 2023-09-12 LAB — COMPREHENSIVE METABOLIC PANEL WITH GFR
ALT: 51 U/L — ABNORMAL HIGH (ref 0–44)
AST: 40 U/L (ref 15–41)
Albumin: 2.4 g/dL — ABNORMAL LOW (ref 3.5–5.0)
Alkaline Phosphatase: 84 U/L (ref 38–126)
Anion gap: 10 (ref 5–15)
BUN: 24 mg/dL — ABNORMAL HIGH (ref 8–23)
CO2: 30 mmol/L (ref 22–32)
Calcium: 8.6 mg/dL — ABNORMAL LOW (ref 8.9–10.3)
Chloride: 97 mmol/L — ABNORMAL LOW (ref 98–111)
Creatinine, Ser: 0.81 mg/dL (ref 0.61–1.24)
GFR, Estimated: >60 mL/min (ref >60)
Glucose, Bld: 105 mg/dL — ABNORMAL HIGH (ref 70–99)
Potassium: 3.9 mmol/L (ref 3.5–5.1)
Sodium: 137 mmol/L (ref 135–145)
Total Bilirubin: 0.5 mg/dL (ref 0.0–1.2)
Total Protein: 6.6 g/dL (ref 6.5–8.1)

## 2023-09-12 LAB — CBC WITH DIFFERENTIAL/PLATELET
Abs Immature Granulocytes: 0.18 10*3/uL — ABNORMAL HIGH (ref 0.00–0.07)
Basophils Absolute: 0.1 10*3/uL (ref 0.0–0.1)
Basophils Relative: 1 %
Eosinophils Absolute: 0.3 10*3/uL (ref 0.0–0.5)
Eosinophils Relative: 3 %
HCT: 32.2 % — ABNORMAL LOW (ref 39.0–52.0)
Hemoglobin: 9.9 g/dL — ABNORMAL LOW (ref 13.0–17.0)
Immature Granulocytes: 2 %
Lymphocytes Relative: 12 %
Lymphs Abs: 1.3 10*3/uL (ref 0.7–4.0)
MCH: 27.3 pg (ref 26.0–34.0)
MCHC: 30.7 g/dL (ref 30.0–36.0)
MCV: 89 fL (ref 80.0–100.0)
Monocytes Absolute: 0.9 10*3/uL (ref 0.1–1.0)
Monocytes Relative: 8 %
Neutro Abs: 7.9 10*3/uL — ABNORMAL HIGH (ref 1.7–7.7)
Neutrophils Relative %: 74 %
Platelets: 309 10*3/uL (ref 150–400)
RBC: 3.62 MIL/uL — ABNORMAL LOW (ref 4.22–5.81)
RDW: 14.9 % (ref 11.5–15.5)
WBC: 10.7 10*3/uL — ABNORMAL HIGH (ref 4.0–10.5)
nRBC: 0 % (ref 0.0–0.2)

## 2023-09-12 LAB — CK: Total CK: 69 U/L (ref 49–397)

## 2023-09-12 NOTE — Plan of Care (Signed)

## 2023-09-12 NOTE — Progress Notes (Signed)
   09/12/23 2205  BiPAP/CPAP/SIPAP  $ Non-Invasive Home Ventilator  Subsequent  BiPAP/CPAP/SIPAP Pt Type Adult  BiPAP/CPAP/SIPAP Resmed  Mask Type Full face mask  Dentures removed? Not applicable  Mask Size Large  Respiratory Rate 18 breaths/min  Flow Rate 2 lpm  CPAP 10 cmH2O  Patient Home Machine No  Patient Home Mask No  Patient Home Tubing No  Auto Titrate No  Device Plugged into RED Power Outlet Yes  BiPAP/CPAP /SiPAP Vitals  Pulse Rate 84  Resp 18  SpO2 97 %  MEWS Score/Color  MEWS Score 0  MEWS Score Color Marrie Sizer

## 2023-09-12 NOTE — Progress Notes (Signed)
 PROGRESS NOTE    Curtis Hill  ZOX:[id number] DOB: 1945/12/12 DOA: 09/04/2023 PCP: Jimmey Mould, MD   Brief Narrative: Curtis Hill is a 78 y.o. male with a history of morbid obesity, hypertension, hyperlipidemia, aortic aneurysm.  Patient presented secondary to a fall with associated generalized weakness and found to have evidence of sepsis on admission presumed secondary to panniculitis. Empiric antibiotics started with improvement of infection.  The patient has worked with PT, but has had difficulty getting out of bed even with their help. He has required quite a bit of help just to get up to the chair and back into bed. Initially the patient had wanted to discharge to home with home health. He has now thought better of it. He wants to go to rehab. TOC has been consulted.  The patient is complaining of GI distention and gas. He is asking for simethicone , which was ordered. The patient is also encouraged to get out of bed and sit up in a chair and work with PT. The patient states that he would rather not do that today due to the difficulties in getting in and out of bed even with the assistance of PT. I advised him that this would only get worse with staying in bed.   TOC has been consulted for placement as the patient has decided that he cannot handle going home on his own.   He is awaiting placement.  Assessment and Plan:  Sepsis Resolved Present on admission. Secondary to cellulitis. Fevers with leukocytosis. Blood cultures obtained on admission have had no growth to date. The patient was started on empiric Vancomycin , Cefepime  and Flagyl . This has now been narrowed to cefadroxil  and doxycycline . Leukocytosis has resolved with antibiotics.  Panniculitis Cellulitis Noted clinically and on CT imaging. Patient started empirically on antibiotics, in addition to nystatin . Wound care consulted. Improvement with Vancomycin  and Ceftriaxone . Patient is instructed to focus on keeping  the area clean and dry.  -Discontinued Vancomycin  and Ceftriaxone  and started Doxycycline  and Cefadroxil  -Continue nystatin  -Leukocytosis has resolved. WBC 10.7 today.   AKI Baseline creatinine from 2023 of around 1. Creatinine of 1.43 on admission. Improvement to 0.81, around baseline. AKI resolved.  Rhabdomyolysis Resolved. Stop IV fluids.  Onychomycosis Severe especially on right foot. Will start PO lamisil . He will need to see a podiatrist as outpatient. He will need to have LFT's followed as outpatient.   Elevated AST/ALT In part secondary to rhabdomyolysis. Likely also due to steatohepatitis. Patient is aware of this diagnosis. CT imaging shows evidence of cirrhosis that is stable from prior exam. AST/ALT stable. Continue to monitor as the patient has been started on lamisil . This will also need to be followed as outpatient.  Dyspnea Due to [facility] and atelectasis. Pt is using CPAP as inpatient, but states that the mask does not work as well as his at home does. Incentive spirometry ordered.  Epigastric burning pain/Gaseous distention Present after eating. No significant history of GERD per pateint. Mostly has been eating while reclined. Simethicone  ordered. Patient encouraged to get up out of bed. -Eat sitting upright Pepcid  -Protonix  -Maalox  Primary hypertension Patient is on valsartan as an outpatient which was held on admission. -Start irbesartan  (substituted for home valsartan)  Hyperlipidemia -Continue Lipitor  Iron deficiency anemia Mild anemia. Stable.  Ascending aortic aneurysm Measuring 45 mm, up from 42 mm one year prior. Recommendation for semi-annual imaging with CTA or MRA in addition to cardiothoracic surgery referral.  Obesity, class III Estimated body  mass index is 75.07 kg/m as calculated from the following:   Height as of this encounter: 5\' 7"  (1.702 m).   Weight as of this encounter: 217.4 kg.   DVT prophylaxis: Lovenox  Code Status:   Code  Status: Full Code Family Communication: None Disposition Plan: Discharge home likely in 24 hours if fever curve remains good   Consultants:  Wound care  Procedures:  None  Antimicrobials: Vancomycin  Cefepime  Flagyl    Subjective: Patient reports continued dyspnea overnight. Also reports reflux symptoms after eating. Afebrile for the last 24 hours  Objective: BP (!) 155/84   Pulse 86   Temp 98.6 F (37 C) (Oral)   Resp (!) 22   Ht 5\' 7"  (1.702 m)   Wt (!) 217.4 kg   SpO2 95%   BMI 75.07 kg/m   Exam:  Constitutional:  The patient is awake, alert, and oriented x 3. No acute distress. Respiratory:  No increased work of breathing. No wheezes, rales, or rhonchi No tactile fremitus Cardiovascular:  Regular rate and rhythm No murmurs, ectopy, or gallups. No lateral PMI. No thrills. Abdomen:  Abdomen is morbidly obese. It is soft, non-tender, non-distended No hernias, masses, or organomegaly Bowel sounds are distant  Musculoskeletal:  No cyanosis or clubbing 2-3 pitting edema bilaterally Paronychia of great toe of right foot Sever onychomycosis of right foot. Skin:  No rashes, lesions, ulcers palpation of skin: no induration or nodules Erythema underneath lower pannus and in groins. Neurologic:  CN 2-12 intact Sensation all 4 extremities intact Psychiatric:  Mental status Mood, affect appropriate Orientation to person, place, time  judgment and insight appear intact  Data Reviewed: I have personally reviewed following labs and imaging studies  CBC Lab Results  Component Value Date   WBC 10.7 (H) 09/12/2023   RBC 3.62 (L) 09/12/2023   HGB 9.9 (L) 09/12/2023   HCT 32.2 (L) 09/12/2023   MCV 89.0 09/12/2023   MCH 27.3 09/12/2023   PLT 309 09/12/2023   MCHC 30.7 09/12/2023   RDW 14.9 09/12/2023   LYMPHSABS 1.3 09/12/2023   MONOABS 0.9 09/12/2023   EOSABS 0.3 09/12/2023   BASOSABS 0.1 09/12/2023     Last metabolic panel Lab Results  Component  Value Date   NA 137 09/12/2023   K 3.9 09/12/2023   CL 97 (L) 09/12/2023   CO2 30 09/12/2023   BUN 24 (H) 09/12/2023   CREATININE 0.81 09/12/2023   GLUCOSE 105 (H) 09/12/2023   GFRNONAA >60 09/12/2023   GFRAA 89 05/23/2020   CALCIUM  8.6 (L) 09/12/2023   PHOS 2.7 09/05/2023   PROT 6.6 09/12/2023   ALBUMIN 2.4 (L) 09/12/2023   LABGLOB 2.9 05/23/2020   AGRATIO 1.5 05/23/2020   BILITOT 0.5 09/12/2023   ALKPHOS 84 09/12/2023   AST 40 09/12/2023   ALT 51 (H) 09/12/2023   ANIONGAP 10 09/12/2023    GFR: Estimated Creatinine Clearance: 136.8 mL/min (by C-G formula based on SCr of 0.81 mg/dL).  Recent Results (from the past 240 hours)  Blood Culture (routine x 2)     Status: None   Collection Time: 09/04/23  5:15 PM   Specimen: BLOOD  Result Value Ref Range Status   Specimen Description BLOOD SITE NOT SPECIFIED  Final   Special Requests   Final    BOTTLES DRAWN AEROBIC AND ANAEROBIC Blood Culture results may not be optimal due to an inadequate volume of blood received in culture bottles   Culture   Final    NO GROWTH 5 DAYS  Performed at [facility], [address] [place], [place] [address]    Report Status 09/09/2023 FINAL  Final  Resp panel by RT-PCR (RSV, Flu A&B, Covid) Anterior Nasal Swab     Status: None   Collection Time: 09/04/23  6:48 PM   Specimen: Anterior Nasal Swab  Result Value Ref Range Status   SARS Coronavirus 2 by RT PCR NEGATIVE NEGATIVE Final   Influenza A by PCR NEGATIVE NEGATIVE Final   Influenza B by PCR NEGATIVE NEGATIVE Final    Comment: (NOTE) The Xpert Xpress SARS-CoV-2/FLU/RSV plus assay is intended as an aid in the diagnosis of influenza from Nasopharyngeal swab specimens and should not be used as a sole basis for treatment. Nasal washings and aspirates are unacceptable for Xpert Xpress SARS-CoV-2/FLU/RSV testing.  Fact Sheet for Patients: BloggerCourse.com  Fact Sheet for Healthcare  Providers: SeriousBroker.it  This test is not yet approved or cleared by the [place]  FDA and has been authorized for detection and/or diagnosis of SARS-CoV-2 by FDA under an Emergency Use Authorization (EUA). This EUA will remain in effect (meaning this test can be used) for the duration of the COVID-19 declaration under Section 564(b)(1) of the Act, 21 U.S.C. section 360bbb-3(b)(1), unless the authorization is terminated or revoked.     Resp Syncytial Virus by PCR NEGATIVE NEGATIVE Final    Comment: (NOTE) Fact Sheet for Patients: BloggerCourse.com  Fact Sheet for Healthcare Providers: SeriousBroker.it  This test is not yet approved or cleared by the [place]  FDA and has been authorized for detection and/or diagnosis of SARS-CoV-2 by FDA under an Emergency Use Authorization (EUA). This EUA will remain in effect (meaning this test can be used) for the duration of the COVID-19 declaration under Section 564(b)(1) of the Act, 21 U.S.C. section 360bbb-3(b)(1), unless the authorization is terminated or revoked.  Performed at [facility], [address], [place], [place] [address]   MRSA Next Gen by PCR, Nasal     Status: None   Collection Time: 09/04/23  9:50 PM   Specimen: Nasal Mucosa; Nasal Swab  Result Value Ref Range Status   MRSA by PCR Next Gen NOT DETECTED NOT DETECTED Final    Comment: (NOTE) The GeneXpert MRSA Assay (FDA approved for NASAL specimens only), is one component of a comprehensive MRSA colonization surveillance program. It is not intended to diagnose MRSA infection nor to guide or monitor treatment for MRSA infections. Test performance is not FDA approved in patients less than 46 years old. Performed at [facility], [address], [place], [place] [address]   Blood Culture (routine x 2)     Status: None   Collection Time: 09/05/23  3:46 AM   Specimen:  BLOOD  Result Value Ref Range Status   Specimen Description BLOOD SITE NOT SPECIFIED  Final   Special Requests   Final    BOTTLES DRAWN AEROBIC ONLY Blood Culture results may not be optimal due to an inadequate volume of blood received in culture bottles   Culture   Final    NO GROWTH 5 DAYS Performed at [facility], [address], [place], [place] [address]    Report Status 09/10/2023 FINAL  Final      Radiology Studies: No results found.      LOS: 7 days    Kellie Murrill, DO [facility] 09/12/2023, 4:12 PM   If 7PM-7AM, please contact night-coverage [email or web address]

## 2023-09-13 ENCOUNTER — Other Ambulatory Visit (HOSPITAL_COMMUNITY): Payer: Self-pay

## 2023-09-13 DIAGNOSIS — M6282 Rhabdomyolysis: Secondary | ICD-10-CM | POA: Diagnosis not present

## 2023-09-13 DIAGNOSIS — N3289 Other specified disorders of bladder: Secondary | ICD-10-CM | POA: Diagnosis not present

## 2023-09-13 DIAGNOSIS — J45909 Unspecified asthma, uncomplicated: Secondary | ICD-10-CM | POA: Diagnosis not present

## 2023-09-13 DIAGNOSIS — L89150 Pressure ulcer of sacral region, unstageable: Secondary | ICD-10-CM | POA: Diagnosis not present

## 2023-09-13 DIAGNOSIS — M314 Aortic arch syndrome [Takayasu]: Secondary | ICD-10-CM | POA: Diagnosis not present

## 2023-09-13 DIAGNOSIS — Z803 Family history of malignant neoplasm of breast: Secondary | ICD-10-CM | POA: Diagnosis not present

## 2023-09-13 DIAGNOSIS — E66813 Obesity, class 3: Secondary | ICD-10-CM | POA: Diagnosis not present

## 2023-09-13 DIAGNOSIS — G4733 Obstructive sleep apnea (adult) (pediatric): Secondary | ICD-10-CM | POA: Diagnosis not present

## 2023-09-13 DIAGNOSIS — I10 Essential (primary) hypertension: Secondary | ICD-10-CM | POA: Diagnosis not present

## 2023-09-13 DIAGNOSIS — Z833 Family history of diabetes mellitus: Secondary | ICD-10-CM | POA: Diagnosis not present

## 2023-09-13 DIAGNOSIS — R6520 Severe sepsis without septic shock: Secondary | ICD-10-CM | POA: Diagnosis not present

## 2023-09-13 DIAGNOSIS — M793 Panniculitis, unspecified: Secondary | ICD-10-CM | POA: Diagnosis not present

## 2023-09-13 DIAGNOSIS — L03818 Cellulitis of other sites: Secondary | ICD-10-CM | POA: Diagnosis not present

## 2023-09-13 DIAGNOSIS — E538 Deficiency of other specified B group vitamins: Secondary | ICD-10-CM | POA: Diagnosis not present

## 2023-09-13 DIAGNOSIS — Z6841 Body Mass Index (BMI) 40.0 and over, adult: Secondary | ICD-10-CM | POA: Diagnosis not present

## 2023-09-13 DIAGNOSIS — N390 Urinary tract infection, site not specified: Secondary | ICD-10-CM | POA: Diagnosis not present

## 2023-09-13 DIAGNOSIS — I714 Abdominal aortic aneurysm, without rupture, unspecified: Secondary | ICD-10-CM | POA: Diagnosis not present

## 2023-09-13 DIAGNOSIS — K4020 Bilateral inguinal hernia, without obstruction or gangrene, not specified as recurrent: Secondary | ICD-10-CM | POA: Diagnosis not present

## 2023-09-13 DIAGNOSIS — B372 Candidiasis of skin and nail: Secondary | ICD-10-CM | POA: Diagnosis not present

## 2023-09-13 DIAGNOSIS — A419 Sepsis, unspecified organism: Secondary | ICD-10-CM | POA: Diagnosis not present

## 2023-09-13 DIAGNOSIS — R0602 Shortness of breath: Secondary | ICD-10-CM | POA: Diagnosis not present

## 2023-09-13 DIAGNOSIS — M109 Gout, unspecified: Secondary | ICD-10-CM | POA: Diagnosis not present

## 2023-09-13 DIAGNOSIS — R0689 Other abnormalities of breathing: Secondary | ICD-10-CM | POA: Diagnosis not present

## 2023-09-13 DIAGNOSIS — R6510 Systemic inflammatory response syndrome (SIRS) of non-infectious origin without acute organ dysfunction: Secondary | ICD-10-CM | POA: Diagnosis not present

## 2023-09-13 DIAGNOSIS — Z8269 Family history of other diseases of the musculoskeletal system and connective tissue: Secondary | ICD-10-CM | POA: Diagnosis not present

## 2023-09-13 DIAGNOSIS — I2510 Atherosclerotic heart disease of native coronary artery without angina pectoris: Secondary | ICD-10-CM | POA: Diagnosis not present

## 2023-09-13 DIAGNOSIS — Z9181 History of falling: Secondary | ICD-10-CM | POA: Diagnosis not present

## 2023-09-13 DIAGNOSIS — R262 Difficulty in walking, not elsewhere classified: Secondary | ICD-10-CM | POA: Diagnosis not present

## 2023-09-13 DIAGNOSIS — L304 Erythema intertrigo: Secondary | ICD-10-CM | POA: Diagnosis not present

## 2023-09-13 DIAGNOSIS — N179 Acute kidney failure, unspecified: Secondary | ICD-10-CM | POA: Diagnosis not present

## 2023-09-13 DIAGNOSIS — Z888 Allergy status to other drugs, medicaments and biological substances status: Secondary | ICD-10-CM | POA: Diagnosis not present

## 2023-09-13 DIAGNOSIS — D509 Iron deficiency anemia, unspecified: Secondary | ICD-10-CM | POA: Diagnosis not present

## 2023-09-13 DIAGNOSIS — Z881 Allergy status to other antibiotic agents status: Secondary | ICD-10-CM | POA: Diagnosis not present

## 2023-09-13 DIAGNOSIS — E875 Hyperkalemia: Secondary | ICD-10-CM | POA: Diagnosis present

## 2023-09-13 DIAGNOSIS — R000 Tachycardia, unspecified: Secondary | ICD-10-CM | POA: Diagnosis not present

## 2023-09-13 DIAGNOSIS — Z7401 Bed confinement status: Secondary | ICD-10-CM | POA: Diagnosis not present

## 2023-09-13 DIAGNOSIS — M6281 Muscle weakness (generalized): Secondary | ICD-10-CM | POA: Diagnosis not present

## 2023-09-13 DIAGNOSIS — Z809 Family history of malignant neoplasm, unspecified: Secondary | ICD-10-CM | POA: Diagnosis not present

## 2023-09-13 DIAGNOSIS — B965 Pseudomonas (aeruginosa) (mallei) (pseudomallei) as the cause of diseases classified elsewhere: Secondary | ICD-10-CM | POA: Diagnosis not present

## 2023-09-13 DIAGNOSIS — R0989 Other specified symptoms and signs involving the circulatory and respiratory systems: Secondary | ICD-10-CM | POA: Diagnosis not present

## 2023-09-13 DIAGNOSIS — G9341 Metabolic encephalopathy: Secondary | ICD-10-CM | POA: Diagnosis present

## 2023-09-13 DIAGNOSIS — I517 Cardiomegaly: Secondary | ICD-10-CM | POA: Diagnosis not present

## 2023-09-13 DIAGNOSIS — I700 Atherosclerosis of aorta: Secondary | ICD-10-CM | POA: Diagnosis not present

## 2023-09-13 DIAGNOSIS — E785 Hyperlipidemia, unspecified: Secondary | ICD-10-CM | POA: Diagnosis not present

## 2023-09-13 DIAGNOSIS — K5730 Diverticulosis of large intestine without perforation or abscess without bleeding: Secondary | ICD-10-CM | POA: Diagnosis not present

## 2023-09-13 DIAGNOSIS — I272 Pulmonary hypertension, unspecified: Secondary | ICD-10-CM | POA: Diagnosis not present

## 2023-09-13 DIAGNOSIS — E559 Vitamin D deficiency, unspecified: Secondary | ICD-10-CM | POA: Diagnosis not present

## 2023-09-13 DIAGNOSIS — R935 Abnormal findings on diagnostic imaging of other abdominal regions, including retroperitoneum: Secondary | ICD-10-CM | POA: Diagnosis not present

## 2023-09-13 DIAGNOSIS — B351 Tinea unguium: Secondary | ICD-10-CM | POA: Diagnosis not present

## 2023-09-13 DIAGNOSIS — L89152 Pressure ulcer of sacral region, stage 2: Secondary | ICD-10-CM | POA: Diagnosis not present

## 2023-09-13 DIAGNOSIS — J9601 Acute respiratory failure with hypoxia: Secondary | ICD-10-CM | POA: Diagnosis not present

## 2023-09-13 LAB — CBC WITH DIFFERENTIAL/PLATELET
Abs Immature Granulocytes: 0.2 10*3/uL — ABNORMAL HIGH (ref 0.00–0.07)
Basophils Absolute: 0 10*3/uL (ref 0.0–0.1)
Basophils Relative: 0 %
Eosinophils Absolute: 0.3 10*3/uL (ref 0.0–0.5)
Eosinophils Relative: 3 %
HCT: 33.8 % — ABNORMAL LOW (ref 39.0–52.0)
Hemoglobin: 10.3 g/dL — ABNORMAL LOW (ref 13.0–17.0)
Immature Granulocytes: 2 %
Lymphocytes Relative: 11 %
Lymphs Abs: 1.2 10*3/uL (ref 0.7–4.0)
MCH: 26.9 pg (ref 26.0–34.0)
MCHC: 30.5 g/dL (ref 30.0–36.0)
MCV: 88.3 fL (ref 80.0–100.0)
Monocytes Absolute: 0.9 10*3/uL (ref 0.1–1.0)
Monocytes Relative: 8 %
Neutro Abs: 8 10*3/uL — ABNORMAL HIGH (ref 1.7–7.7)
Neutrophils Relative %: 76 %
Platelets: 299 10*3/uL (ref 150–400)
RBC: 3.83 MIL/uL — ABNORMAL LOW (ref 4.22–5.81)
RDW: 14.9 % (ref 11.5–15.5)
WBC: 10.6 10*3/uL — ABNORMAL HIGH (ref 4.0–10.5)
nRBC: 0 % (ref 0.0–0.2)

## 2023-09-13 LAB — BASIC METABOLIC PANEL WITH GFR
Anion gap: 7 (ref 5–15)
BUN: 19 mg/dL (ref 8–23)
CO2: 33 mmol/L — ABNORMAL HIGH (ref 22–32)
Calcium: 8.8 mg/dL — ABNORMAL LOW (ref 8.9–10.3)
Chloride: 99 mmol/L (ref 98–111)
Creatinine, Ser: 0.79 mg/dL (ref 0.61–1.24)
GFR, Estimated: >60 mL/min (ref >60)
Glucose, Bld: 103 mg/dL — ABNORMAL HIGH (ref 70–99)
Potassium: 3.9 mmol/L (ref 3.5–5.1)
Sodium: 139 mmol/L (ref 135–145)

## 2023-09-13 LAB — CK: Total CK: 66 U/L (ref 49–397)

## 2023-09-13 MED ORDER — TRAMADOL HCL 50 MG PO TABS: 50.0000 mg | ORAL_TABLET | Freq: Two times a day (BID) | ORAL | 0 refills | Status: DC | PRN

## 2023-09-13 MED ORDER — NYSTATIN 100000 UNIT/GM EX POWD: Freq: Three times a day (TID) | CUTANEOUS | Status: AC

## 2023-09-13 MED ORDER — CEFADROXIL 500 MG PO CAPS: 500.0000 mg | ORAL_CAPSULE | Freq: Two times a day (BID) | ORAL | Status: AC

## 2023-09-13 MED ORDER — CARVEDILOL 6.25 MG PO TABS: 6.2500 mg | ORAL_TABLET | Freq: Two times a day (BID) | ORAL | Status: DC

## 2023-09-13 MED ORDER — TRAMADOL HCL 50 MG PO TABS
50.0000 mg | ORAL_TABLET | Freq: Two times a day (BID) | ORAL | 0 refills | Status: DC | PRN
  Filled 2023-09-13: qty 30, 15d supply, fill #0

## 2023-09-13 MED ORDER — DOXYCYCLINE HYCLATE 100 MG PO TABS
100.0000 mg | ORAL_TABLET | Freq: Two times a day (BID) | ORAL | Status: AC
Start: 2023-09-13 — End: 2023-09-15

## 2023-09-13 NOTE — TOC Progression Note (Addendum)
 Transition of Care [facility]) - Progression Note    Patient Details  Name: Curtis Hill MRN: [id number] Date of Birth: 1946-03-24  Transition of [facility]) CM/SW Contact  Arron Big, [place] Phone Number: 09/13/2023, 10:44 AM  Clinical Narrative:   Bariatric bed needs communicated on patients FL2.   9:11AM Patient chose [facility]. CSW updated facility and treatment team. Per facility patient can DC today.  10:45 AM Facility states they need to order patient an air mattress for patient, air mattress will not be delivered until tomorrow. CSW inquired if patient can still dc to facility while awaiting air mattress as patient is medically stable for discharge. Awaiting response.   10:52 AM Per facility, patient cannot discharge until air mattress is delivered for bariatric bed.   11:47 AM Per facility, they were able to obtain bariatric mattress today and patient can DC. CSW notified treatment team.  TOC will continue to follow.    Expected Discharge Plan: Skilled Nursing Facility Barriers to Discharge: Continued Medical Work up, Other (must enter comment), SNF Pending bed offer (Needs home CPAP  brought to hospital or facility)  Expected Discharge Plan and Services   Discharge Planning Services: CM Consult Post Acute Care Choice: Home Health, Durable Medical Equipment Living arrangements for the past 2 months: Single Family Home Expected Discharge Date: 09/13/23                         HH Arranged: RN, PT, OT, Nurse's Aide HH Agency: Other - See comment, [facility] Date [facility] Agency Contacted: 09/09/23 Time HH Agency Contacted: 1601 Representative spoke with at [facility] Agency: Howell Macintosh   Social Determinants of Health (SDOH) Interventions SDOH Screenings   Food Insecurity: No Food Insecurity (09/07/2023)  Housing: Low Risk  (09/07/2023)  Transportation Needs: Unmet Transportation Needs (09/07/2023)  Utilities: Not At Risk (09/07/2023)  Depression (PHQ2-9): Medium Risk  (05/23/2020)  Social Connections: Socially Integrated (09/07/2023)  Tobacco Use: Low Risk  (09/04/2023)    Readmission Risk Interventions     No data to display

## 2023-09-13 NOTE — Progress Notes (Signed)
 PROGRESS NOTE    Curtis Hill  WUJ:[id number] DOB: 02-06-1946 DOA: 09/04/2023 PCP: Jimmey Mould, MD   Brief Narrative: No notes on file  The patient has worked with PT, but has had difficulty getting out of bed even with their help. He has required quite a bit of help just to get up to the chair and back into bed. Initially the patient had wanted to discharge to home with home health. He has now thought better of it. He wants to go to rehab. TOC has been consulted.  The patient is complaining of GI distention and gas. He is asking for simethicone , which was ordered. The patient is also encouraged to get out of bed and sit up in a chair and work with PT. The patient states that he would rather not do that today due to the difficulties in getting in and out of bed even with the assistance of PT. I advised him that this would only get worse with staying in bed.   TOC has been consulted for placement as the patient has decided that he cannot handle going home on his own.   6/2: Approved for snf today but awaiting bariatric bed which won't be delivered until tomorrow  Assessment and Plan:  Sepsis Resolved Present on admission. Secondary to cellulitis. Fevers with leukocytosis. Blood cultures obtained on admission have had no growth to date. The patient was started on empiric Vancomycin , Cefepime  and Flagyl . This has now been narrowed to cefadroxil  and doxycycline . Plan to complete a 10 day course  Panniculitis Cellulitis Noted clinically and on CT imaging. Patient started empirically on antibiotics, in addition to nystatin . Wound care consulted. Improvement with Vancomycin  and Ceftriaxone . Patient is instructed to focus on keeping the area clean and dry.  -Discontinued Vancomycin  and Ceftriaxone  and started Doxycycline  and Cefadroxil  -Continue nystatin    AKI Baseline creatinine from 2023 of around 1. Creatinine of 1.43 on admission. Improvement to 0.81, around baseline. AKI  resolved.  Rhabdomyolysis Resolved. Off ivf  Onychomycosis Severe especially on right foot. Will stop oral lamisil  given lft elevation and unclear whether outpt monitoring can be arranged, will defer to outpt pcp  Elevated AST/ALT In part secondary to rhabdomyolysis. Likely also due to steatohepatitis. Patient is aware of this diagnosis.    Epigastric burning pain/Gaseous distention Present after eating. No significant history of GERD per pateint. Mostly has been eating while reclined. Simethicone  ordered. Patient encouraged to get up out of bed. -Eat sitting upright Pepcid  -Protonix  -Maalox  Primary hypertension Patient is on valsartan as an outpatient which was held on admission. -Start irbesartan  (substituted for home valsartan)  Hyperlipidemia -Continue Lipitor  Iron deficiency anemia Mild anemia. Stable.  Ascending aortic aneurysm Measuring 45 mm, up from 42 mm one year prior. Recommendation for semi-annual imaging with CTA or MRA in addition to cardiothoracic surgery referral.  Obesity, class III Estimated body mass index is 73.11 kg/m as calculated from the following:   Height as of this encounter: 5\' 7"  (1.702 m).   Weight as of this encounter: 211.7 kg.   DVT prophylaxis: Lovenox  Code Status:   Code Status: Full Code Family Communication: None Disposition Plan: snf tomorrow   Consultants:  Wound care  Procedures:  None  Antimicrobials: Vancomycin  Cefepime  Flagyl    Subjective: Reports feeling stable, no complaints  Objective: BP (!) 159/78 (BP Location: Right Arm)   Pulse 71   Temp 98.2 F (36.8 C) (Oral)   Resp (!) 21   Ht 5\' 7"  (1.702  m)   Wt (!) 211.7 kg   SpO2 92%   BMI 73.11 kg/m   Exam:  Constitutional:  The patient is awake, alert, and oriented x 3. No acute distress. Respiratory:  No increased work of breathing.  Cardiovascular:  Regular rate and rhythm No murmurs, ectopy, or gallups. Abdomen:  Abdomen is morbidly  obese. It is soft, non-tender, non-distended  Musculoskeletal:  No cyanosis or clubbing 2-3 pitting edema bilaterally Skin:  Lichenification of skin under pannus Neurologic:  Moving all 4 Psychiatric:  Mental status Mood, affect appropriate Orientation to person, place, time  judgment and insight appear intact  Data Reviewed: I have personally reviewed following labs and imaging studies  CBC Lab Results  Component Value Date   WBC 10.6 (H) 09/13/2023   RBC 3.83 (L) 09/13/2023   HGB 10.3 (L) 09/13/2023   HCT 33.8 (L) 09/13/2023   MCV 88.3 09/13/2023   MCH 26.9 09/13/2023   PLT 299 09/13/2023   MCHC 30.5 09/13/2023   RDW 14.9 09/13/2023   LYMPHSABS 1.2 09/13/2023   MONOABS 0.9 09/13/2023   EOSABS 0.3 09/13/2023   BASOSABS 0.0 09/13/2023     Last metabolic panel Lab Results  Component Value Date   NA 139 09/13/2023   K 3.9 09/13/2023   CL 99 09/13/2023   CO2 33 (H) 09/13/2023   BUN 19 09/13/2023   CREATININE 0.79 09/13/2023   GLUCOSE 103 (H) 09/13/2023   GFRNONAA >60 09/13/2023   GFRAA 89 05/23/2020   CALCIUM  8.8 (L) 09/13/2023   PHOS 2.7 09/05/2023   PROT 6.6 09/12/2023   ALBUMIN 2.4 (L) 09/12/2023   LABGLOB 2.9 05/23/2020   AGRATIO 1.5 05/23/2020   BILITOT 0.5 09/12/2023   ALKPHOS 84 09/12/2023   AST 40 09/12/2023   ALT 51 (H) 09/12/2023   ANIONGAP 7 09/13/2023    GFR: Estimated Creatinine Clearance: 136 mL/min (by C-G formula based on SCr of 0.79 mg/dL).  Recent Results (from the past 240 hours)  Blood Culture (routine x 2)     Status: None   Collection Time: 09/04/23  5:15 PM   Specimen: BLOOD  Result Value Ref Range Status   Specimen Description BLOOD SITE NOT SPECIFIED  Final   Special Requests   Final    BOTTLES DRAWN AEROBIC AND ANAEROBIC Blood Culture results may not be optimal due to an inadequate volume of blood received in culture bottles   Culture   Final    NO GROWTH 5 DAYS Performed at [facility], [address],  [place], [place] [address]    Report Status 09/09/2023 FINAL  Final  Resp panel by RT-PCR (RSV, Flu A&B, Covid) Anterior Nasal Swab     Status: None   Collection Time: 09/04/23  6:48 PM   Specimen: Anterior Nasal Swab  Result Value Ref Range Status   SARS Coronavirus 2 by RT PCR NEGATIVE NEGATIVE Final   Influenza A by PCR NEGATIVE NEGATIVE Final   Influenza B by PCR NEGATIVE NEGATIVE Final    Comment: (NOTE) The Xpert Xpress SARS-CoV-2/FLU/RSV plus assay is intended as an aid in the diagnosis of influenza from Nasopharyngeal swab specimens and should not be used as a sole basis for treatment. Nasal washings and aspirates are unacceptable for Xpert Xpress SARS-CoV-2/FLU/RSV testing.  Fact Sheet for Patients: BloggerCourse.com  Fact Sheet for Healthcare Providers: SeriousBroker.it  This test is not yet approved or cleared by the [place]  FDA and has been authorized for detection and/or diagnosis of SARS-CoV-2 by  FDA under an Emergency Use Authorization (EUA). This EUA will remain in effect (meaning this test can be used) for the duration of the COVID-19 declaration under Section 564(b)(1) of the Act, 21 U.S.C. section 360bbb-3(b)(1), unless the authorization is terminated or revoked.     Resp Syncytial Virus by PCR NEGATIVE NEGATIVE Final    Comment: (NOTE) Fact Sheet for Patients: BloggerCourse.com  Fact Sheet for Healthcare Providers: SeriousBroker.it  This test is not yet approved or cleared by the [place]  FDA and has been authorized for detection and/or diagnosis of SARS-CoV-2 by FDA under an Emergency Use Authorization (EUA). This EUA will remain in effect (meaning this test can be used) for the duration of the COVID-19 declaration under Section 564(b)(1) of the Act, 21 U.S.C. section 360bbb-3(b)(1), unless the authorization is terminated or revoked.  Performed at  [facility], [address], [place], [place] [address]   MRSA Next Gen by PCR, Nasal     Status: None   Collection Time: 09/04/23  9:50 PM   Specimen: Nasal Mucosa; Nasal Swab  Result Value Ref Range Status   MRSA by PCR Next Gen NOT DETECTED NOT DETECTED Final    Comment: (NOTE) The GeneXpert MRSA Assay (FDA approved for NASAL specimens only), is one component of a comprehensive MRSA colonization surveillance program. It is not intended to diagnose MRSA infection nor to guide or monitor treatment for MRSA infections. Test performance is not FDA approved in patients less than 26 years old. Performed at [facility], [address], [place], [place] [address]   Blood Culture (routine x 2)     Status: None   Collection Time: 09/05/23  3:46 AM   Specimen: BLOOD  Result Value Ref Range Status   Specimen Description BLOOD SITE NOT SPECIFIED  Final   Special Requests   Final    BOTTLES DRAWN AEROBIC ONLY Blood Culture results may not be optimal due to an inadequate volume of blood received in culture bottles   Culture   Final    NO GROWTH 5 DAYS Performed at [facility], [address], [place], [place] [address]    Report Status 09/10/2023 FINAL  Final      Radiology Studies: No results found.      LOS: 8 days   Hines Ludwig, MD [facility] 09/13/2023, 11:18 AM   If 7PM-7AM, please contact night-coverage [email or web address]

## 2023-09-13 NOTE — Discharge Planning (Signed)
 Report called to [facility] [phone].

## 2023-09-13 NOTE — Care Management Important Message (Signed)
 Important Message  Patient Details  Name: Curtis Hill MRN: [id number] Date of Birth: 30-Apr-1945   Important Message Given:  Yes - Medicare IM     [name] 09/13/2023, 3:31 PM

## 2023-09-13 NOTE — TOC Transition Note (Signed)
 Transition of Care [facility]) - Discharge Note   Patient Details  Name: Curtis Hill MRN: [id number] Date of Birth: Apr 03, 1946  Transition of [facility]) CM/SW Contact:  Arron Big, LCSWA Phone Number: 09/13/2023, 12:00 PM   Clinical Narrative:   Patient will DC to: [facility] Anticipated DC date: 09/13/23  Family notified: Monroe Antigua (spouse) Transport by: Lyna Sandhoff   Per MD patient ready for DC to [facility]. RN to call report prior to discharge [phone], opt 0; room 3251). RN, patient, patient's family, and facility notified of DC. Discharge Summary and FL2 sent to facility. DC packet on chart. Ambulance transport requested for patient 11:54AM.   CSW will sign off for now as social work intervention is no longer needed. Please consult us  again if new needs arise.      Final next level of care: Skilled Nursing Facility Barriers to Discharge: Barriers Resolved   Patient Goals and CMS Choice Patient states their goals for this hospitalization and ongoing recovery are:: return home CMS Medicare.gov Compare Post Acute Care list provided to:: Patient Choice offered to / list presented to : Patient      Discharge Placement              Patient chooses bed at: [facility] Patient to be transferred to facility by: [facility] Name of family member notified: Monroe Antigua (spouse) Patient and family notified of of transfer: 09/13/23  Discharge Plan and Services Additional resources added to the After Visit Summary for     Discharge Planning Services: CM Consult Post Acute Care Choice: Home Health, Durable Medical Equipment                    HH Arranged: RN, PT, OT, Nurse's Aide HH Agency: Other - See comment, [facility] Date [facility] Agency Contacted: 09/09/23 Time HH Agency Contacted: 1601 Representative spoke with at [facility] Agency: Howell Macintosh  Social Drivers of Health (SDOH) Interventions SDOH Screenings   Food Insecurity: No Food Insecurity (09/07/2023)   Housing: Low Risk  (09/07/2023)  Transportation Needs: Unmet Transportation Needs (09/07/2023)  Utilities: Not At Risk (09/07/2023)  Depression (PHQ2-9): Medium Risk (05/23/2020)  Social Connections: Socially Integrated (09/07/2023)  Tobacco Use: Low Risk  (09/04/2023)     Readmission Risk Interventions     No data to display

## 2023-09-13 NOTE — Discharge Summary (Signed)
 Curtis Hill ZOX:[id number] DOB: 1946-02-04 DOA: 09/04/2023  PCP: Curtis Mould, MD  Admit date: 09/04/2023 Discharge date: 09/13/2023  Time spent: 35 minutes  Recommendations for Outpatient Follow-up:  Close pcp f/u  Incidental aortic aneurysm surveillance as below    Discharge Diagnoses:  Principal Problem:   SIRS (systemic inflammatory response syndrome) (HCC) Active Problems:   BMI 70 and over, adult (HCC)   ANEMIA, IRON DEFICIENCY   Benign essential HTN   Thoracic aortic aneurysm (TAA) (HCC)   Hyperlipidemia   Panniculitis   Debility   AKI (acute kidney injury) (HCC)   OSA (obstructive sleep apnea)   Discharge Condition: stable  Diet recommendation: heart healthy carb modified  Filed Weights   09/09/23 0415 09/10/23 0538 09/13/23 0300  Weight: (!) 217.3 kg (!) 217.4 kg (!) 211.7 kg    History of present illness:  Copied from admission h and p from Curtis Hill is a 78 y.o. male with medical history significant of morbid obesity, HTN, HLD, aortic aneurysm,      Presented with   fall , generalized weakness Reports was too weak to get back up when he slipped in the bathroom  Has been in his house for 1 year due to his weight Had an ulcer on the top of his right he was treated with Bactrim  Denies any CP no SOB has been urinating much more frequently, he has to stand up to pee due to panus No change in color of urine      Denies any diarrhea no abdominal pain.  Denies any melena no black stools or blood in stools. Denies significant ETOH intake   Does not smoke     Hospital Course:   Curtis Hill is a 78 y.o. male with a history of morbid obesity, hypertension, hyperlipidemia, aortic aneurysm.  Patient presented secondary to a fall with associated generalized weakness and found to have evidence of sepsis on admission presumed secondary to panniculitis. Empiric antibiotics started with improvement of infection.   The patient has worked with  PT, but has had difficulty getting out of bed even with their help. He has required quite a bit of help just to get up to the chair and back into bed. Initially the patient had wanted to discharge to home with home health. He has now thought better of it. He wants to go to rehab. TOC has been consulted.   The patient is complaining of GI distention and gas. He is asking for simethicone , which was ordered. The patient is also encouraged to get out of bed and sit up in a chair and work with PT. The patient states that he would rather not do that today due to the difficulties in getting in and out of bed even with the assistance of PT. I advised him that this would only get worse with staying in bed.    TOC has been consulted for placement as the patient has decided that he cannot handle going home on his own.  Sepsis Resolved Present on admission. Secondary to cellulitis. Fevers with leukocytosis. Blood cultures obtained on admission have had no growth to date. The patient was started on empiric Vancomycin , Cefepime  and Flagyl . This has now been narrowed to cefadroxil  and doxycycline .     Panniculitis Cellulitis Noted clinically and on CT imaging. Patient started empirically on antibiotics, in addition to nystatin . Wound care consulted. Improvement with Vancomycin  and Ceftriaxone . Patient is instructed to focus on keeping the area clean  and dry.  -Discontinued Vancomycin  and Ceftriaxone  and started Doxycycline  and Cefadroxil  -Continue nystatin  -finish oral abx to complete a 10 day course of abx   AKI Resolved  Rhabdomyolysis Resolved.     Onychomycosis Started on po lamisil  but given elevated LFTs on presentation discharge provider has decided to d/c lamisil , patient can f/u with PCP regarding this   OSA Continued cpap at bedtime, may also have component of OHS   Primary hypertension Reasonably controlled on home ARB   Hyperlipidemia -Continue Lipitor   Iron deficiency anemia Mild  anemia. Stable.   Ascending aortic aneurysm Measuring 45 mm, up from 42 mm one year prior. Recommendation for semi-annual imaging with CTA or MRA in addition to cardiothoracic surgery referral.   Obesity, class III  Procedures: none   Consultations: Wound care  Discharge Exam: Vitals:   09/13/23 0427 09/13/23 0732  BP: (!) 146/79 (!) 159/78  Pulse: 83 78  Resp: (!) 22 (!) 21  Temp: 98.4 F (36.9 C) 98.2 F (36.8 C)  SpO2: 90% 91%    General: NAD Cardiovascular: RRR, distant heart sounds Respiratory: Clear  Discharge Instructions   Discharge Instructions     Diet - low sodium heart healthy   Complete by: As directed    Discharge wound care:   Complete by: As directed    3 times daily      Comments: Cleanse underneath pannus/abdominal folds with Vashe wound cleanser Timm Foot [phone]), do not rinse and allow to air dry.  Apply Nystatin  powder as prescribed by MD then apply Interdry AG as follows: Order Timm Foot # [phone] Measure and cut length of InterDry to fit in skin folds that have skin breakdown Tuck InterDry fabric into skin folds in a single layer, allow for 2 inches of overhang from skin edges to allow for wicking to occur May remove to bathe; dry area thoroughly and then tuck into affected areas again Do not apply any creams or ointments when using InterDry DO NOT THROW AWAY FOR 5 DAYS unless soiled with stool DO NOT [facility] product, this will inactivate the silver in the material  New sheet of Interdry should be applied after 5 days of use if patient continues to have skin breakdown   Increase activity slowly   Complete by: As directed       Allergies as of 09/13/2023       Reactions   Levofloxacin Other (See Comments)   Aortic aneurysm   Lisinopril Anaphylaxis, Shortness Of Breath, Swelling, Rash   Prednisone  Swelling        Medication List     STOP taking these medications    sulfamethoxazole-trimethoprim 800-160 MG tablet Commonly known as:  BACTRIM DS       TAKE these medications    albuterol  108 (90 Base) MCG/ACT inhaler Commonly known as: VENTOLIN  HFA Inhale 2 puffs into the lungs every 6 (six) hours as needed for wheezing or shortness of breath.   allopurinol 300 MG tablet Commonly known as: ZYLOPRIM Take 300 mg by mouth daily.   aspirin  EC 81 MG tablet Take 81 mg by mouth daily. Swallow whole.   atorvastatin  20 MG tablet Commonly known as: LIPITOR Take 20 mg by mouth every evening.   CALTRATE 600 PO Take 1 tablet by mouth daily at 12 noon.   carvedilol  6.25 MG tablet Commonly known as: COREG  Take 1 tablet (6.25 mg total) by mouth 2 (two) times daily with a meal.   cefadroxil  500 MG capsule Commonly known as: DURICEF  Take 1 capsule (500 mg total) by mouth 2 (two) times daily for 2 days.   Centrum Silver 50+Men Tabs Take 1 tablet by mouth in the morning.   cholecalciferol 25 MCG (1000 UNIT) tablet Commonly known as: VITAMIN D3 Take 1,000 Units by mouth daily.   CoQ10 200 MG Caps Take 1 capsule by mouth in the morning.   cyanocobalamin 500 MCG tablet Commonly known as: VITAMIN B12 Take 1,000 mcg by mouth daily.   doxycycline  100 MG tablet Commonly known as: VIBRA -TABS Take 1 tablet (100 mg total) by mouth every 12 (twelve) hours for 2 days.   furosemide  40 MG tablet Commonly known as: LASIX  Take 40 mg by mouth daily as needed for edema or fluid.   Magnesium  200 MG Tabs Take 400 mg by mouth daily at 12 noon.   nystatin  powder Commonly known as: MYCOSTATIN /NYSTOP  Apply topically 3 (three) times daily.   traMADol  50 MG tablet Commonly known as: ULTRAM  Take 1 tablet (50 mg total) by mouth 2 (two) times daily as needed for moderate pain (pain score 4-6) or severe pain (pain score 7-10).   valsartan 320 MG tablet Commonly known as: DIOVAN Take 320 mg by mouth daily.               Durable Medical Equipment  (From admission, onward)           Start     Ordered   09/09/23  1621  For home use only DME oxygen  Once       Question Answer Comment  Length of Need Lifetime   Mode or (Route) Nasal cannula   Liters per Minute 2   Frequency Continuous (stationary and portable oxygen unit needed)   Oxygen delivery system Gas      09/09/23 1620              Discharge Care Instructions  (From admission, onward)           Start     Ordered   09/13/23 0000  Discharge wound care:       Comments: 3 times daily      Comments: Cleanse underneath pannus/abdominal folds with Vashe wound cleanser Timm Foot [phone]), do not rinse and allow to air dry.  Apply Nystatin  powder as prescribed by MD then apply Interdry AG as follows: Order Timm Foot # [phone] Measure and cut length of InterDry to fit in skin folds that have skin breakdown Tuck InterDry fabric into skin folds in a single layer, allow for 2 inches of overhang from skin edges to allow for wicking to occur May remove to bathe; dry area thoroughly and then tuck into affected areas again Do not apply any creams or ointments when using InterDry DO NOT THROW AWAY FOR 5 DAYS unless soiled with stool DO NOT [facility] product, this will inactivate the silver in the material  New sheet of Interdry should be applied after 5 days of use if patient continues to have skin breakdown   09/13/23 1038           Allergies  Allergen Reactions   Levofloxacin Other (See Comments)    Aortic aneurysm   Lisinopril Anaphylaxis, Shortness Of Breath, Swelling and Rash   Prednisone  Swelling    Follow-up Information     [facility] Follow up.   Why: Home health has been arranged. They will contact you to schedule apt within 48 hrs post discharge. Contact information: [address] [place] [address] [phone]  [facility] Follow up.   Why: home oxygen Contact information: [address] [place] [address] [phone]         Curtis Mould, MD Follow up.    Specialty: Family Medicine Contact information: [address] [place] [address] [phone]                  The results of significant diagnostics from this hospitalization (including imaging, microbiology, ancillary and laboratory) are listed below for reference.    Significant Diagnostic Studies: DG CHEST PORT 1 VIEW Result Date: 09/07/2023 CLINICAL DATA:  Dyspnea. EXAM: PORTABLE CHEST 1 VIEW COMPARISON:  09/04/2023. FINDINGS: Low lung volumes. Patient is rotated to the right. Stable cardiomegaly. The left lung base/costophrenic angle is not completely included within the field of view. Mild right basilar atelectasis. No focal consolidation, large pleural effusion, or pneumothorax. No acute osseous abnormality. IMPRESSION: Limited exam due to positioning and low lung volumes. Mild right basilar atelectasis. Otherwise, no acute cardiopulmonary findings. Electronically Signed   By: Mannie Seek M.D.   On: 09/07/2023 14:42   CT CHEST ABDOMEN PELVIS WO CONTRAST Result Date: 09/05/2023 CLINICAL DATA:  Systemic inflammatory response syndrome. Possible sepsis. EXAM: CT CHEST, ABDOMEN AND PELVIS WITHOUT CONTRAST TECHNIQUE: Multidetector CT imaging of the chest, abdomen and pelvis was performed following the standard protocol without IV contrast. RADIATION DOSE REDUCTION: This exam was performed according to the departmental dose-optimization program which includes automated exposure control, adjustment of the mA and/or kV according to patient size and/or use of iterative reconstruction technique. COMPARISON:  Abdominal radiograph 09/04/2023; CT chest 06/22/2022; CT abdomen pelvis 05/18/2017; chest radiograph 09/04/2023 FINDINGS: CT CHEST FINDINGS Cardiovascular: No pericardial effusion. Coronary artery and aortic atherosclerotic calcification. The ascending aorta is dilated measuring 45 mm in diameter. This is increased compared to 06/22/2022 when it measured 42 mm using similar  measuring technique. Mediastinum/Nodes: Trachea and esophagus are unremarkable. No thoracic adenopathy. Lungs/Pleura: No focal consolidation, pleural effusion, or pneumothorax. Musculoskeletal: No acute fracture. CT ABDOMEN PELVIS FINDINGS Hepatobiliary: Mild nodularity of the hepatic contour with hypertrophy of the caudate lobe. Decompressed gallbladder. No biliary dilation. Pancreas: Fatty atrophy.  No acute abnormality. Spleen: Unremarkable. Adrenals/Urinary Tract: Normal adrenal glands. Intermediate density exophytic cystic lesion off the posterior right kidney measuring 1.7 cm. This likely represents a proteinaceous or hemorrhagic cyst and has decreased in size from 2019 when it measured 2.7 cm. Given decrease in size, no follow-up is recommended. No urinary calculi or hydronephrosis. Unremarkable bladder. Stomach/Bowel: Normal caliber large and small bowel. Extensive diverticulosis of the sigmoid colon without diverticulitis. Stomach is within normal limits. Vascular/Lymphatic: Aortic atherosclerosis. No enlarged abdominal or pelvic lymph nodes. Reproductive: Metallic seeds in the prostate. Other: No free intraperitoneal fluid or air. Musculoskeletal: No acute fracture. Partially visualized soft tissue thickening, free fluid, and fat stranding in the lower abdominal wall pannus. No abscess. IMPRESSION: 1. Partially visualized soft tissue thickening, free fluid, and fat stranding in the lower abdominal wall pannus. No abscess. Correlate for panniculitis/cellulitis. 2. Similar findings of cirrhosis. 3. Ascending aortic aneurysm measuring 45 mm in diameter, increased from 06/22/2022 when it measured 42 mm. Ascending thoracic aortic aneurysm. Recommend semi-annual imaging followup by CTA or MRA and referral to cardiothoracic surgery if not already obtained. This recommendation follows 2010 ACCF/AHA/AATS/ACR/ASA/SCA/SCAI/SIR/STS/SVM Guidelines for the Diagnosis and Management of Patients With Thoracic Aortic  Disease. Circulation. 2010; 121: V784-O962. Aortic aneurysm NOS (ICD10-I71.9) 4. Aortic Atherosclerosis (ICD10-I70.0). Electronically Signed   By: Christell Cove.D.  On: 09/05/2023 01:40   DG Abd 1 View Result Date: 09/04/2023 CLINICAL DATA:  [id number] SIRS (systemic inflammatory response syndrome) (HCC) [id number] EXAM: ABDOMEN - 1 VIEW COMPARISON:  CT abdomen pelvis 05/18/2017 FINDINGS: The bowel gas pattern is normal. Radiation seeds overlie the expected region of the prostate. No radio-opaque calculi or other significant radiographic abnormality are seen. IMPRESSION: Nonobstructive bowel gas pattern. Electronically Signed   By: Morgane  Naveau M.D.   On: 09/04/2023 22:41   DG Chest Port 1 View Result Date: 09/04/2023 CLINICAL DATA:  Possible sepsis. EXAM: PORTABLE CHEST 1 VIEW COMPARISON:  10/25/2016. FINDINGS: Evaluation is limited due to patient's body habitus and rotation. The heart is enlarged. Evaluation of the mediastinal structures is limited. The tracheal column appears patent. Atelectasis is noted at the left lung base. There is a small right pleural effusion with atelectasis or infiltrate. No pneumothorax is seen. No acute osseous abnormality. IMPRESSION: 1. Small right pleural effusion with atelectasis or infiltrate. 2. Left basilar atelectasis. 3. Cardiomegaly. Electronically Signed   By: Wyvonnia Heimlich M.D.   On: 09/04/2023 18:06    Microbiology: Recent Results (from the past 240 hours)  Blood Culture (routine x 2)     Status: None   Collection Time: 09/04/23  5:15 PM   Specimen: BLOOD  Result Value Ref Range Status   Specimen Description BLOOD SITE NOT SPECIFIED  Final   Special Requests   Final    BOTTLES DRAWN AEROBIC AND ANAEROBIC Blood Culture results may not be optimal due to an inadequate volume of blood received in culture bottles   Culture   Final    NO GROWTH 5 DAYS Performed at [facility], [address], [place], [place] [address]    Report Status 09/09/2023  FINAL  Final  Resp panel by RT-PCR (RSV, Flu A&B, Covid) Anterior Nasal Swab     Status: None   Collection Time: 09/04/23  6:48 PM   Specimen: Anterior Nasal Swab  Result Value Ref Range Status   SARS Coronavirus 2 by RT PCR NEGATIVE NEGATIVE Final   Influenza A by PCR NEGATIVE NEGATIVE Final   Influenza B by PCR NEGATIVE NEGATIVE Final    Comment: (NOTE) The Xpert Xpress SARS-CoV-2/FLU/RSV plus assay is intended as an aid in the diagnosis of influenza from Nasopharyngeal swab specimens and should not be used as a sole basis for treatment. Nasal washings and aspirates are unacceptable for Xpert Xpress SARS-CoV-2/FLU/RSV testing.  Fact Sheet for Patients: BloggerCourse.com  Fact Sheet for Healthcare Providers: SeriousBroker.it  This test is not yet approved or cleared by the [place]  FDA and has been authorized for detection and/or diagnosis of SARS-CoV-2 by FDA under an Emergency Use Authorization (EUA). This EUA will remain in effect (meaning this test can be used) for the duration of the COVID-19 declaration under Section 564(b)(1) of the Act, 21 U.S.C. section 360bbb-3(b)(1), unless the authorization is terminated or revoked.     Resp Syncytial Virus by PCR NEGATIVE NEGATIVE Final    Comment: (NOTE) Fact Sheet for Patients: BloggerCourse.com  Fact Sheet for Healthcare Providers: SeriousBroker.it  This test is not yet approved or cleared by the [place]  FDA and has been authorized for detection and/or diagnosis of SARS-CoV-2 by FDA under an Emergency Use Authorization (EUA). This EUA will remain in effect (meaning this test can be used) for the duration of the COVID-19 declaration under Section 564(b)(1) of the Act, 21 U.S.C. section 360bbb-3(b)(1), unless the authorization is terminated or revoked.  Performed  at [facility], [address],  [place], [place] [address]   MRSA Next Gen by PCR, Nasal     Status: None   Collection Time: 09/04/23  9:50 PM   Specimen: Nasal Mucosa; Nasal Swab  Result Value Ref Range Status   MRSA by PCR Next Gen NOT DETECTED NOT DETECTED Final    Comment: (NOTE) The GeneXpert MRSA Assay (FDA approved for NASAL specimens only), is one component of a comprehensive MRSA colonization surveillance program. It is not intended to diagnose MRSA infection nor to guide or monitor treatment for MRSA infections. Test performance is not FDA approved in patients less than 5 years old. Performed at [facility], [address] [place], [place] [address]   Blood Culture (routine x 2)     Status: None   Collection Time: 09/05/23  3:46 AM   Specimen: BLOOD  Result Value Ref Range Status   Specimen Description BLOOD SITE NOT SPECIFIED  Final   Special Requests   Final    BOTTLES DRAWN AEROBIC ONLY Blood Culture results may not be optimal due to an inadequate volume of blood received in culture bottles   Culture   Final    NO GROWTH 5 DAYS Performed at [facility], [address], [place], [place] [address]    Report Status 09/10/2023 FINAL  Final     Labs: Basic Metabolic Panel: Recent Labs  Lab 09/07/23 0241 09/09/23 0259 09/10/23 0247 09/12/23 0305 09/13/23 0302  NA 137 138 139 137 139  K 4.1 4.3 4.0 3.9 3.9  CL 104 102 100 97* 99  CO2 27 30 30 30  33*  GLUCOSE 115* 110* 110* 105* 103*  BUN 21 24* 24* 24* 19  CREATININE 1.05 0.81 0.84 0.81 0.79  CALCIUM  8.3* 8.6* 8.7* 8.6* 8.8*   Liver Function Tests: Recent Labs  Lab 09/07/23 0241 09/12/23 0305  AST 71* 40  ALT 54* 51*  ALKPHOS 57 84  BILITOT 0.8 0.5  PROT 6.4* 6.6  ALBUMIN 2.4* 2.4*   No results for input(s): "LIPASE", "AMYLASE" in the last 168 hours. No results for input(s): "AMMONIA" in the last 168 hours. CBC: Recent Labs  Lab 09/08/23 0240 09/09/23 0259 09/10/23 0247 09/12/23 0305 09/13/23 0302  WBC 10.4 10.5  11.7* 10.7* 10.6*  NEUTROABS  --  7.9* 9.0* 7.9* 8.0*  HGB 10.0* 9.6* 10.9* 9.9* 10.3*  HCT 32.0* 31.0* 35.5* 32.2* 33.8*  MCV 86.7 86.8 89.0 89.0 88.3  PLT 198 250 252 309 299   Cardiac Enzymes: Recent Labs  Lab 09/09/23 0259 09/10/23 0247 09/11/23 0316 09/12/23 0305 09/13/23 0302  CKTOTAL 187 106 88 69 66   BNP: BNP (last 3 results) No results for input(s): "BNP" in the last 8760 hours.  ProBNP (last 3 results) No results for input(s): "PROBNP" in the last 8760 hours.  CBG: No results for input(s): "GLUCAP" in the last 168 hours.     Signed:  Raymonde Calico MD.  [facility] 09/13/2023, 10:39 AM

## 2023-09-14 DIAGNOSIS — N179 Acute kidney failure, unspecified: Secondary | ICD-10-CM | POA: Diagnosis not present

## 2023-09-14 DIAGNOSIS — M6282 Rhabdomyolysis: Secondary | ICD-10-CM | POA: Diagnosis not present

## 2023-09-14 DIAGNOSIS — A419 Sepsis, unspecified organism: Secondary | ICD-10-CM | POA: Diagnosis not present

## 2023-09-14 DIAGNOSIS — Z9181 History of falling: Secondary | ICD-10-CM | POA: Diagnosis not present

## 2023-09-14 DIAGNOSIS — I714 Abdominal aortic aneurysm, without rupture, unspecified: Secondary | ICD-10-CM | POA: Diagnosis not present

## 2023-09-14 DIAGNOSIS — Z6841 Body Mass Index (BMI) 40.0 and over, adult: Secondary | ICD-10-CM | POA: Diagnosis not present

## 2023-09-14 DIAGNOSIS — B372 Candidiasis of skin and nail: Secondary | ICD-10-CM | POA: Diagnosis not present

## 2023-09-14 DIAGNOSIS — G4733 Obstructive sleep apnea (adult) (pediatric): Secondary | ICD-10-CM | POA: Diagnosis not present

## 2023-09-14 DIAGNOSIS — M793 Panniculitis, unspecified: Secondary | ICD-10-CM | POA: Diagnosis not present

## 2023-09-14 DIAGNOSIS — E785 Hyperlipidemia, unspecified: Secondary | ICD-10-CM | POA: Diagnosis not present

## 2023-09-14 DIAGNOSIS — B351 Tinea unguium: Secondary | ICD-10-CM | POA: Diagnosis not present

## 2023-09-14 DIAGNOSIS — I10 Essential (primary) hypertension: Secondary | ICD-10-CM | POA: Diagnosis not present

## 2023-09-15 DIAGNOSIS — I10 Essential (primary) hypertension: Secondary | ICD-10-CM | POA: Diagnosis not present

## 2023-09-15 DIAGNOSIS — N179 Acute kidney failure, unspecified: Secondary | ICD-10-CM | POA: Diagnosis not present

## 2023-09-15 DIAGNOSIS — Z6841 Body Mass Index (BMI) 40.0 and over, adult: Secondary | ICD-10-CM | POA: Diagnosis not present

## 2023-09-15 DIAGNOSIS — B351 Tinea unguium: Secondary | ICD-10-CM | POA: Diagnosis not present

## 2023-09-15 DIAGNOSIS — Z9181 History of falling: Secondary | ICD-10-CM | POA: Diagnosis not present

## 2023-09-15 DIAGNOSIS — M793 Panniculitis, unspecified: Secondary | ICD-10-CM | POA: Diagnosis not present

## 2023-09-15 DIAGNOSIS — G4733 Obstructive sleep apnea (adult) (pediatric): Secondary | ICD-10-CM | POA: Diagnosis not present

## 2023-09-15 DIAGNOSIS — I714 Abdominal aortic aneurysm, without rupture, unspecified: Secondary | ICD-10-CM | POA: Diagnosis not present

## 2023-09-15 DIAGNOSIS — E785 Hyperlipidemia, unspecified: Secondary | ICD-10-CM | POA: Diagnosis not present

## 2023-09-15 DIAGNOSIS — M6282 Rhabdomyolysis: Secondary | ICD-10-CM | POA: Diagnosis not present

## 2023-09-15 DIAGNOSIS — A419 Sepsis, unspecified organism: Secondary | ICD-10-CM | POA: Diagnosis not present

## 2023-09-17 DIAGNOSIS — B351 Tinea unguium: Secondary | ICD-10-CM | POA: Diagnosis not present

## 2023-09-17 DIAGNOSIS — A419 Sepsis, unspecified organism: Secondary | ICD-10-CM | POA: Diagnosis not present

## 2023-09-17 DIAGNOSIS — I10 Essential (primary) hypertension: Secondary | ICD-10-CM | POA: Diagnosis not present

## 2023-09-17 DIAGNOSIS — Z9181 History of falling: Secondary | ICD-10-CM | POA: Diagnosis not present

## 2023-09-17 DIAGNOSIS — M793 Panniculitis, unspecified: Secondary | ICD-10-CM | POA: Diagnosis not present

## 2023-09-17 DIAGNOSIS — E785 Hyperlipidemia, unspecified: Secondary | ICD-10-CM | POA: Diagnosis not present

## 2023-09-17 DIAGNOSIS — I714 Abdominal aortic aneurysm, without rupture, unspecified: Secondary | ICD-10-CM | POA: Diagnosis not present

## 2023-09-17 DIAGNOSIS — N179 Acute kidney failure, unspecified: Secondary | ICD-10-CM | POA: Diagnosis not present

## 2023-09-17 DIAGNOSIS — G4733 Obstructive sleep apnea (adult) (pediatric): Secondary | ICD-10-CM | POA: Diagnosis not present

## 2023-09-17 DIAGNOSIS — Z6841 Body Mass Index (BMI) 40.0 and over, adult: Secondary | ICD-10-CM | POA: Diagnosis not present

## 2023-09-17 DIAGNOSIS — M6282 Rhabdomyolysis: Secondary | ICD-10-CM | POA: Diagnosis not present

## 2023-09-20 DIAGNOSIS — E785 Hyperlipidemia, unspecified: Secondary | ICD-10-CM | POA: Diagnosis not present

## 2023-09-20 DIAGNOSIS — G4733 Obstructive sleep apnea (adult) (pediatric): Secondary | ICD-10-CM | POA: Diagnosis not present

## 2023-09-20 DIAGNOSIS — M793 Panniculitis, unspecified: Secondary | ICD-10-CM | POA: Diagnosis not present

## 2023-09-20 DIAGNOSIS — I714 Abdominal aortic aneurysm, without rupture, unspecified: Secondary | ICD-10-CM | POA: Diagnosis not present

## 2023-09-20 DIAGNOSIS — N179 Acute kidney failure, unspecified: Secondary | ICD-10-CM | POA: Diagnosis not present

## 2023-09-20 DIAGNOSIS — Z6841 Body Mass Index (BMI) 40.0 and over, adult: Secondary | ICD-10-CM | POA: Diagnosis not present

## 2023-09-20 DIAGNOSIS — B351 Tinea unguium: Secondary | ICD-10-CM | POA: Diagnosis not present

## 2023-09-20 DIAGNOSIS — A419 Sepsis, unspecified organism: Secondary | ICD-10-CM | POA: Diagnosis not present

## 2023-09-20 DIAGNOSIS — M6282 Rhabdomyolysis: Secondary | ICD-10-CM | POA: Diagnosis not present

## 2023-09-20 DIAGNOSIS — I10 Essential (primary) hypertension: Secondary | ICD-10-CM | POA: Diagnosis not present

## 2023-09-20 DIAGNOSIS — Z9181 History of falling: Secondary | ICD-10-CM | POA: Diagnosis not present

## 2023-09-22 DIAGNOSIS — I10 Essential (primary) hypertension: Secondary | ICD-10-CM | POA: Diagnosis not present

## 2023-09-22 DIAGNOSIS — M793 Panniculitis, unspecified: Secondary | ICD-10-CM | POA: Diagnosis not present

## 2023-09-22 DIAGNOSIS — M6282 Rhabdomyolysis: Secondary | ICD-10-CM | POA: Diagnosis not present

## 2023-09-22 DIAGNOSIS — N179 Acute kidney failure, unspecified: Secondary | ICD-10-CM | POA: Diagnosis not present

## 2023-09-22 DIAGNOSIS — A419 Sepsis, unspecified organism: Secondary | ICD-10-CM | POA: Diagnosis not present

## 2023-09-22 DIAGNOSIS — B351 Tinea unguium: Secondary | ICD-10-CM | POA: Diagnosis not present

## 2023-09-22 DIAGNOSIS — G4733 Obstructive sleep apnea (adult) (pediatric): Secondary | ICD-10-CM | POA: Diagnosis not present

## 2023-09-22 DIAGNOSIS — E785 Hyperlipidemia, unspecified: Secondary | ICD-10-CM | POA: Diagnosis not present

## 2023-09-22 DIAGNOSIS — B372 Candidiasis of skin and nail: Secondary | ICD-10-CM | POA: Diagnosis not present

## 2023-09-22 DIAGNOSIS — Z9181 History of falling: Secondary | ICD-10-CM | POA: Diagnosis not present

## 2023-09-22 DIAGNOSIS — Z6841 Body Mass Index (BMI) 40.0 and over, adult: Secondary | ICD-10-CM | POA: Diagnosis not present

## 2023-09-22 DIAGNOSIS — I714 Abdominal aortic aneurysm, without rupture, unspecified: Secondary | ICD-10-CM | POA: Diagnosis not present

## 2023-09-24 DIAGNOSIS — I714 Abdominal aortic aneurysm, without rupture, unspecified: Secondary | ICD-10-CM | POA: Diagnosis not present

## 2023-09-24 DIAGNOSIS — N179 Acute kidney failure, unspecified: Secondary | ICD-10-CM | POA: Diagnosis not present

## 2023-09-24 DIAGNOSIS — I10 Essential (primary) hypertension: Secondary | ICD-10-CM | POA: Diagnosis not present

## 2023-09-24 DIAGNOSIS — B351 Tinea unguium: Secondary | ICD-10-CM | POA: Diagnosis not present

## 2023-09-24 DIAGNOSIS — Z6841 Body Mass Index (BMI) 40.0 and over, adult: Secondary | ICD-10-CM | POA: Diagnosis not present

## 2023-09-24 DIAGNOSIS — G4733 Obstructive sleep apnea (adult) (pediatric): Secondary | ICD-10-CM | POA: Diagnosis not present

## 2023-09-24 DIAGNOSIS — E785 Hyperlipidemia, unspecified: Secondary | ICD-10-CM | POA: Diagnosis not present

## 2023-09-24 DIAGNOSIS — Z9181 History of falling: Secondary | ICD-10-CM | POA: Diagnosis not present

## 2023-09-24 DIAGNOSIS — M793 Panniculitis, unspecified: Secondary | ICD-10-CM | POA: Diagnosis not present

## 2023-09-24 DIAGNOSIS — M6282 Rhabdomyolysis: Secondary | ICD-10-CM | POA: Diagnosis not present

## 2023-09-24 DIAGNOSIS — A419 Sepsis, unspecified organism: Secondary | ICD-10-CM | POA: Diagnosis not present

## 2023-09-27 DIAGNOSIS — E785 Hyperlipidemia, unspecified: Secondary | ICD-10-CM | POA: Diagnosis not present

## 2023-09-27 DIAGNOSIS — M6282 Rhabdomyolysis: Secondary | ICD-10-CM | POA: Diagnosis not present

## 2023-09-27 DIAGNOSIS — I714 Abdominal aortic aneurysm, without rupture, unspecified: Secondary | ICD-10-CM | POA: Diagnosis not present

## 2023-09-27 DIAGNOSIS — G4733 Obstructive sleep apnea (adult) (pediatric): Secondary | ICD-10-CM | POA: Diagnosis not present

## 2023-09-27 DIAGNOSIS — Z6841 Body Mass Index (BMI) 40.0 and over, adult: Secondary | ICD-10-CM | POA: Diagnosis not present

## 2023-09-27 DIAGNOSIS — M793 Panniculitis, unspecified: Secondary | ICD-10-CM | POA: Diagnosis not present

## 2023-09-27 DIAGNOSIS — N179 Acute kidney failure, unspecified: Secondary | ICD-10-CM | POA: Diagnosis not present

## 2023-09-27 DIAGNOSIS — Z9181 History of falling: Secondary | ICD-10-CM | POA: Diagnosis not present

## 2023-09-27 DIAGNOSIS — A419 Sepsis, unspecified organism: Secondary | ICD-10-CM | POA: Diagnosis not present

## 2023-09-27 DIAGNOSIS — B351 Tinea unguium: Secondary | ICD-10-CM | POA: Diagnosis not present

## 2023-09-27 DIAGNOSIS — I10 Essential (primary) hypertension: Secondary | ICD-10-CM | POA: Diagnosis not present

## 2023-09-29 DIAGNOSIS — M6282 Rhabdomyolysis: Secondary | ICD-10-CM | POA: Diagnosis not present

## 2023-09-29 DIAGNOSIS — N179 Acute kidney failure, unspecified: Secondary | ICD-10-CM | POA: Diagnosis not present

## 2023-09-29 DIAGNOSIS — I10 Essential (primary) hypertension: Secondary | ICD-10-CM | POA: Diagnosis not present

## 2023-09-29 DIAGNOSIS — Z9181 History of falling: Secondary | ICD-10-CM | POA: Diagnosis not present

## 2023-09-29 DIAGNOSIS — G4733 Obstructive sleep apnea (adult) (pediatric): Secondary | ICD-10-CM | POA: Diagnosis not present

## 2023-09-29 DIAGNOSIS — Z6841 Body Mass Index (BMI) 40.0 and over, adult: Secondary | ICD-10-CM | POA: Diagnosis not present

## 2023-09-29 DIAGNOSIS — B351 Tinea unguium: Secondary | ICD-10-CM | POA: Diagnosis not present

## 2023-09-29 DIAGNOSIS — E785 Hyperlipidemia, unspecified: Secondary | ICD-10-CM | POA: Diagnosis not present

## 2023-09-29 DIAGNOSIS — M793 Panniculitis, unspecified: Secondary | ICD-10-CM | POA: Diagnosis not present

## 2023-09-29 DIAGNOSIS — I714 Abdominal aortic aneurysm, without rupture, unspecified: Secondary | ICD-10-CM | POA: Diagnosis not present

## 2023-09-29 DIAGNOSIS — A419 Sepsis, unspecified organism: Secondary | ICD-10-CM | POA: Diagnosis not present

## 2023-10-01 DIAGNOSIS — Z6841 Body Mass Index (BMI) 40.0 and over, adult: Secondary | ICD-10-CM | POA: Diagnosis not present

## 2023-10-01 DIAGNOSIS — A419 Sepsis, unspecified organism: Secondary | ICD-10-CM | POA: Diagnosis not present

## 2023-10-01 DIAGNOSIS — G4733 Obstructive sleep apnea (adult) (pediatric): Secondary | ICD-10-CM | POA: Diagnosis not present

## 2023-10-01 DIAGNOSIS — I714 Abdominal aortic aneurysm, without rupture, unspecified: Secondary | ICD-10-CM | POA: Diagnosis not present

## 2023-10-01 DIAGNOSIS — N179 Acute kidney failure, unspecified: Secondary | ICD-10-CM | POA: Diagnosis not present

## 2023-10-01 DIAGNOSIS — I10 Essential (primary) hypertension: Secondary | ICD-10-CM | POA: Diagnosis not present

## 2023-10-01 DIAGNOSIS — Z9181 History of falling: Secondary | ICD-10-CM | POA: Diagnosis not present

## 2023-10-01 DIAGNOSIS — M6282 Rhabdomyolysis: Secondary | ICD-10-CM | POA: Diagnosis not present

## 2023-10-01 DIAGNOSIS — M793 Panniculitis, unspecified: Secondary | ICD-10-CM | POA: Diagnosis not present

## 2023-10-01 DIAGNOSIS — B351 Tinea unguium: Secondary | ICD-10-CM | POA: Diagnosis not present

## 2023-10-01 DIAGNOSIS — E785 Hyperlipidemia, unspecified: Secondary | ICD-10-CM | POA: Diagnosis not present

## 2023-10-02 DIAGNOSIS — E785 Hyperlipidemia, unspecified: Secondary | ICD-10-CM | POA: Diagnosis not present

## 2023-10-02 DIAGNOSIS — I10 Essential (primary) hypertension: Secondary | ICD-10-CM | POA: Diagnosis not present

## 2023-10-02 DIAGNOSIS — Z9181 History of falling: Secondary | ICD-10-CM | POA: Diagnosis not present

## 2023-10-02 DIAGNOSIS — R262 Difficulty in walking, not elsewhere classified: Secondary | ICD-10-CM | POA: Diagnosis not present

## 2023-10-02 DIAGNOSIS — M314 Aortic arch syndrome [Takayasu]: Secondary | ICD-10-CM | POA: Diagnosis not present

## 2023-10-02 DIAGNOSIS — M6282 Rhabdomyolysis: Secondary | ICD-10-CM | POA: Diagnosis not present

## 2023-10-02 DIAGNOSIS — M6281 Muscle weakness (generalized): Secondary | ICD-10-CM | POA: Diagnosis not present

## 2023-10-02 DIAGNOSIS — Z6841 Body Mass Index (BMI) 40.0 and over, adult: Secondary | ICD-10-CM | POA: Diagnosis not present

## 2023-10-02 DIAGNOSIS — M793 Panniculitis, unspecified: Secondary | ICD-10-CM | POA: Diagnosis not present

## 2023-10-02 DIAGNOSIS — N179 Acute kidney failure, unspecified: Secondary | ICD-10-CM | POA: Diagnosis not present

## 2023-10-02 DIAGNOSIS — A419 Sepsis, unspecified organism: Secondary | ICD-10-CM | POA: Diagnosis not present

## 2023-10-03 DIAGNOSIS — M6282 Rhabdomyolysis: Secondary | ICD-10-CM | POA: Diagnosis not present

## 2023-10-03 DIAGNOSIS — I714 Abdominal aortic aneurysm, without rupture, unspecified: Secondary | ICD-10-CM | POA: Diagnosis not present

## 2023-10-03 DIAGNOSIS — Z9181 History of falling: Secondary | ICD-10-CM | POA: Diagnosis not present

## 2023-10-03 DIAGNOSIS — M793 Panniculitis, unspecified: Secondary | ICD-10-CM | POA: Diagnosis not present

## 2023-10-03 DIAGNOSIS — B351 Tinea unguium: Secondary | ICD-10-CM | POA: Diagnosis not present

## 2023-10-03 DIAGNOSIS — Z6841 Body Mass Index (BMI) 40.0 and over, adult: Secondary | ICD-10-CM | POA: Diagnosis not present

## 2023-10-03 DIAGNOSIS — I10 Essential (primary) hypertension: Secondary | ICD-10-CM | POA: Diagnosis not present

## 2023-10-03 DIAGNOSIS — E785 Hyperlipidemia, unspecified: Secondary | ICD-10-CM | POA: Diagnosis not present

## 2023-10-03 DIAGNOSIS — A419 Sepsis, unspecified organism: Secondary | ICD-10-CM | POA: Diagnosis not present

## 2023-10-03 DIAGNOSIS — N179 Acute kidney failure, unspecified: Secondary | ICD-10-CM | POA: Diagnosis not present

## 2023-10-03 DIAGNOSIS — G4733 Obstructive sleep apnea (adult) (pediatric): Secondary | ICD-10-CM | POA: Diagnosis not present

## 2023-10-04 ENCOUNTER — Telehealth: Payer: Self-pay | Admitting: Plastic Surgery

## 2023-10-04 NOTE — Telephone Encounter (Signed)
 called 10-04-23 and left the nurse cal Sor and the transportation line on referral that the apt would have to be canceled. We are unable to treat, BMI is 70 and greater   Rehab center from referral  [facility]  [phone] Ext [phone] for transportation

## 2023-10-05 DIAGNOSIS — A419 Sepsis, unspecified organism: Secondary | ICD-10-CM | POA: Diagnosis not present

## 2023-10-05 DIAGNOSIS — I10 Essential (primary) hypertension: Secondary | ICD-10-CM | POA: Diagnosis not present

## 2023-10-05 DIAGNOSIS — M6282 Rhabdomyolysis: Secondary | ICD-10-CM | POA: Diagnosis not present

## 2023-10-05 DIAGNOSIS — Z6841 Body Mass Index (BMI) 40.0 and over, adult: Secondary | ICD-10-CM | POA: Diagnosis not present

## 2023-10-05 DIAGNOSIS — B351 Tinea unguium: Secondary | ICD-10-CM | POA: Diagnosis not present

## 2023-10-05 DIAGNOSIS — M793 Panniculitis, unspecified: Secondary | ICD-10-CM | POA: Diagnosis not present

## 2023-10-05 DIAGNOSIS — N179 Acute kidney failure, unspecified: Secondary | ICD-10-CM | POA: Diagnosis not present

## 2023-10-05 DIAGNOSIS — I714 Abdominal aortic aneurysm, without rupture, unspecified: Secondary | ICD-10-CM | POA: Diagnosis not present

## 2023-10-05 DIAGNOSIS — E785 Hyperlipidemia, unspecified: Secondary | ICD-10-CM | POA: Diagnosis not present

## 2023-10-05 DIAGNOSIS — Z9181 History of falling: Secondary | ICD-10-CM | POA: Diagnosis not present

## 2023-10-05 DIAGNOSIS — G4733 Obstructive sleep apnea (adult) (pediatric): Secondary | ICD-10-CM | POA: Diagnosis not present

## 2023-10-08 DIAGNOSIS — Z9181 History of falling: Secondary | ICD-10-CM | POA: Diagnosis not present

## 2023-10-08 DIAGNOSIS — A419 Sepsis, unspecified organism: Secondary | ICD-10-CM | POA: Diagnosis not present

## 2023-10-08 DIAGNOSIS — M6282 Rhabdomyolysis: Secondary | ICD-10-CM | POA: Diagnosis not present

## 2023-10-08 DIAGNOSIS — M793 Panniculitis, unspecified: Secondary | ICD-10-CM | POA: Diagnosis not present

## 2023-10-08 DIAGNOSIS — N179 Acute kidney failure, unspecified: Secondary | ICD-10-CM | POA: Diagnosis not present

## 2023-10-08 DIAGNOSIS — Z6841 Body Mass Index (BMI) 40.0 and over, adult: Secondary | ICD-10-CM | POA: Diagnosis not present

## 2023-10-08 DIAGNOSIS — B351 Tinea unguium: Secondary | ICD-10-CM | POA: Diagnosis not present

## 2023-10-08 DIAGNOSIS — I714 Abdominal aortic aneurysm, without rupture, unspecified: Secondary | ICD-10-CM | POA: Diagnosis not present

## 2023-10-08 DIAGNOSIS — G4733 Obstructive sleep apnea (adult) (pediatric): Secondary | ICD-10-CM | POA: Diagnosis not present

## 2023-10-08 DIAGNOSIS — E785 Hyperlipidemia, unspecified: Secondary | ICD-10-CM | POA: Diagnosis not present

## 2023-10-08 DIAGNOSIS — I10 Essential (primary) hypertension: Secondary | ICD-10-CM | POA: Diagnosis not present

## 2023-10-11 DIAGNOSIS — I714 Abdominal aortic aneurysm, without rupture, unspecified: Secondary | ICD-10-CM | POA: Diagnosis not present

## 2023-10-11 DIAGNOSIS — M793 Panniculitis, unspecified: Secondary | ICD-10-CM | POA: Diagnosis not present

## 2023-10-11 DIAGNOSIS — N179 Acute kidney failure, unspecified: Secondary | ICD-10-CM | POA: Diagnosis not present

## 2023-10-11 DIAGNOSIS — B351 Tinea unguium: Secondary | ICD-10-CM | POA: Diagnosis not present

## 2023-10-11 DIAGNOSIS — M6282 Rhabdomyolysis: Secondary | ICD-10-CM | POA: Diagnosis not present

## 2023-10-11 DIAGNOSIS — I10 Essential (primary) hypertension: Secondary | ICD-10-CM | POA: Diagnosis not present

## 2023-10-11 DIAGNOSIS — Z6841 Body Mass Index (BMI) 40.0 and over, adult: Secondary | ICD-10-CM | POA: Diagnosis not present

## 2023-10-11 DIAGNOSIS — E785 Hyperlipidemia, unspecified: Secondary | ICD-10-CM | POA: Diagnosis not present

## 2023-10-11 DIAGNOSIS — G4733 Obstructive sleep apnea (adult) (pediatric): Secondary | ICD-10-CM | POA: Diagnosis not present

## 2023-10-11 DIAGNOSIS — A419 Sepsis, unspecified organism: Secondary | ICD-10-CM | POA: Diagnosis not present

## 2023-10-11 DIAGNOSIS — Z9181 History of falling: Secondary | ICD-10-CM | POA: Diagnosis not present

## 2023-10-12 DIAGNOSIS — N179 Acute kidney failure, unspecified: Secondary | ICD-10-CM | POA: Diagnosis not present

## 2023-10-12 DIAGNOSIS — M6282 Rhabdomyolysis: Secondary | ICD-10-CM | POA: Diagnosis not present

## 2023-10-12 DIAGNOSIS — G4733 Obstructive sleep apnea (adult) (pediatric): Secondary | ICD-10-CM | POA: Diagnosis not present

## 2023-10-12 DIAGNOSIS — Z6841 Body Mass Index (BMI) 40.0 and over, adult: Secondary | ICD-10-CM | POA: Diagnosis not present

## 2023-10-12 DIAGNOSIS — Z9181 History of falling: Secondary | ICD-10-CM | POA: Diagnosis not present

## 2023-10-12 DIAGNOSIS — A419 Sepsis, unspecified organism: Secondary | ICD-10-CM | POA: Diagnosis not present

## 2023-10-12 DIAGNOSIS — I10 Essential (primary) hypertension: Secondary | ICD-10-CM | POA: Diagnosis not present

## 2023-10-12 DIAGNOSIS — M793 Panniculitis, unspecified: Secondary | ICD-10-CM | POA: Diagnosis not present

## 2023-10-12 DIAGNOSIS — I714 Abdominal aortic aneurysm, without rupture, unspecified: Secondary | ICD-10-CM | POA: Diagnosis not present

## 2023-10-12 DIAGNOSIS — E785 Hyperlipidemia, unspecified: Secondary | ICD-10-CM | POA: Diagnosis not present

## 2023-10-12 DIAGNOSIS — B351 Tinea unguium: Secondary | ICD-10-CM | POA: Diagnosis not present

## 2023-10-13 DIAGNOSIS — Z9181 History of falling: Secondary | ICD-10-CM | POA: Diagnosis not present

## 2023-10-13 DIAGNOSIS — Z6841 Body Mass Index (BMI) 40.0 and over, adult: Secondary | ICD-10-CM | POA: Diagnosis not present

## 2023-10-13 DIAGNOSIS — M6282 Rhabdomyolysis: Secondary | ICD-10-CM | POA: Diagnosis not present

## 2023-10-13 DIAGNOSIS — G4733 Obstructive sleep apnea (adult) (pediatric): Secondary | ICD-10-CM | POA: Diagnosis not present

## 2023-10-13 DIAGNOSIS — A419 Sepsis, unspecified organism: Secondary | ICD-10-CM | POA: Diagnosis not present

## 2023-10-13 DIAGNOSIS — I10 Essential (primary) hypertension: Secondary | ICD-10-CM | POA: Diagnosis not present

## 2023-10-13 DIAGNOSIS — M793 Panniculitis, unspecified: Secondary | ICD-10-CM | POA: Diagnosis not present

## 2023-10-13 DIAGNOSIS — N179 Acute kidney failure, unspecified: Secondary | ICD-10-CM | POA: Diagnosis not present

## 2023-10-13 DIAGNOSIS — E785 Hyperlipidemia, unspecified: Secondary | ICD-10-CM | POA: Diagnosis not present

## 2023-10-13 DIAGNOSIS — I714 Abdominal aortic aneurysm, without rupture, unspecified: Secondary | ICD-10-CM | POA: Diagnosis not present

## 2023-10-13 DIAGNOSIS — B351 Tinea unguium: Secondary | ICD-10-CM | POA: Diagnosis not present

## 2023-10-14 DIAGNOSIS — N179 Acute kidney failure, unspecified: Secondary | ICD-10-CM | POA: Diagnosis not present

## 2023-10-14 DIAGNOSIS — Z9181 History of falling: Secondary | ICD-10-CM | POA: Diagnosis not present

## 2023-10-14 DIAGNOSIS — M6282 Rhabdomyolysis: Secondary | ICD-10-CM | POA: Diagnosis not present

## 2023-10-14 DIAGNOSIS — I10 Essential (primary) hypertension: Secondary | ICD-10-CM | POA: Diagnosis not present

## 2023-10-14 DIAGNOSIS — A419 Sepsis, unspecified organism: Secondary | ICD-10-CM | POA: Diagnosis not present

## 2023-10-14 DIAGNOSIS — E785 Hyperlipidemia, unspecified: Secondary | ICD-10-CM | POA: Diagnosis not present

## 2023-10-14 DIAGNOSIS — G4733 Obstructive sleep apnea (adult) (pediatric): Secondary | ICD-10-CM | POA: Diagnosis not present

## 2023-10-14 DIAGNOSIS — Z6841 Body Mass Index (BMI) 40.0 and over, adult: Secondary | ICD-10-CM | POA: Diagnosis not present

## 2023-10-14 DIAGNOSIS — B351 Tinea unguium: Secondary | ICD-10-CM | POA: Diagnosis not present

## 2023-10-14 DIAGNOSIS — M793 Panniculitis, unspecified: Secondary | ICD-10-CM | POA: Diagnosis not present

## 2023-10-14 DIAGNOSIS — I714 Abdominal aortic aneurysm, without rupture, unspecified: Secondary | ICD-10-CM | POA: Diagnosis not present

## 2023-10-15 DIAGNOSIS — I714 Abdominal aortic aneurysm, without rupture, unspecified: Secondary | ICD-10-CM | POA: Diagnosis not present

## 2023-10-15 DIAGNOSIS — I10 Essential (primary) hypertension: Secondary | ICD-10-CM | POA: Diagnosis not present

## 2023-10-15 DIAGNOSIS — G4733 Obstructive sleep apnea (adult) (pediatric): Secondary | ICD-10-CM | POA: Diagnosis not present

## 2023-10-15 DIAGNOSIS — Z9181 History of falling: Secondary | ICD-10-CM | POA: Diagnosis not present

## 2023-10-15 DIAGNOSIS — B351 Tinea unguium: Secondary | ICD-10-CM | POA: Diagnosis not present

## 2023-10-15 DIAGNOSIS — E785 Hyperlipidemia, unspecified: Secondary | ICD-10-CM | POA: Diagnosis not present

## 2023-10-15 DIAGNOSIS — N179 Acute kidney failure, unspecified: Secondary | ICD-10-CM | POA: Diagnosis not present

## 2023-10-15 DIAGNOSIS — Z6841 Body Mass Index (BMI) 40.0 and over, adult: Secondary | ICD-10-CM | POA: Diagnosis not present

## 2023-10-15 DIAGNOSIS — A419 Sepsis, unspecified organism: Secondary | ICD-10-CM | POA: Diagnosis not present

## 2023-10-15 DIAGNOSIS — M6282 Rhabdomyolysis: Secondary | ICD-10-CM | POA: Diagnosis not present

## 2023-10-15 DIAGNOSIS — M793 Panniculitis, unspecified: Secondary | ICD-10-CM | POA: Diagnosis not present

## 2023-10-18 DIAGNOSIS — N179 Acute kidney failure, unspecified: Secondary | ICD-10-CM | POA: Diagnosis not present

## 2023-10-18 DIAGNOSIS — E785 Hyperlipidemia, unspecified: Secondary | ICD-10-CM | POA: Diagnosis not present

## 2023-10-18 DIAGNOSIS — B351 Tinea unguium: Secondary | ICD-10-CM | POA: Diagnosis not present

## 2023-10-18 DIAGNOSIS — M793 Panniculitis, unspecified: Secondary | ICD-10-CM | POA: Diagnosis not present

## 2023-10-18 DIAGNOSIS — I10 Essential (primary) hypertension: Secondary | ICD-10-CM | POA: Diagnosis not present

## 2023-10-18 DIAGNOSIS — I714 Abdominal aortic aneurysm, without rupture, unspecified: Secondary | ICD-10-CM | POA: Diagnosis not present

## 2023-10-18 DIAGNOSIS — M6282 Rhabdomyolysis: Secondary | ICD-10-CM | POA: Diagnosis not present

## 2023-10-18 DIAGNOSIS — Z6841 Body Mass Index (BMI) 40.0 and over, adult: Secondary | ICD-10-CM | POA: Diagnosis not present

## 2023-10-18 DIAGNOSIS — Z9181 History of falling: Secondary | ICD-10-CM | POA: Diagnosis not present

## 2023-10-18 DIAGNOSIS — A419 Sepsis, unspecified organism: Secondary | ICD-10-CM | POA: Diagnosis not present

## 2023-10-18 DIAGNOSIS — G4733 Obstructive sleep apnea (adult) (pediatric): Secondary | ICD-10-CM | POA: Diagnosis not present

## 2023-10-19 DIAGNOSIS — I10 Essential (primary) hypertension: Secondary | ICD-10-CM | POA: Diagnosis not present

## 2023-10-19 DIAGNOSIS — E785 Hyperlipidemia, unspecified: Secondary | ICD-10-CM | POA: Diagnosis not present

## 2023-10-19 DIAGNOSIS — I714 Abdominal aortic aneurysm, without rupture, unspecified: Secondary | ICD-10-CM | POA: Diagnosis not present

## 2023-10-19 DIAGNOSIS — G4733 Obstructive sleep apnea (adult) (pediatric): Secondary | ICD-10-CM | POA: Diagnosis not present

## 2023-10-19 DIAGNOSIS — B351 Tinea unguium: Secondary | ICD-10-CM | POA: Diagnosis not present

## 2023-10-19 DIAGNOSIS — M6282 Rhabdomyolysis: Secondary | ICD-10-CM | POA: Diagnosis not present

## 2023-10-19 DIAGNOSIS — N179 Acute kidney failure, unspecified: Secondary | ICD-10-CM | POA: Diagnosis not present

## 2023-10-19 DIAGNOSIS — Z9181 History of falling: Secondary | ICD-10-CM | POA: Diagnosis not present

## 2023-10-19 DIAGNOSIS — Z6841 Body Mass Index (BMI) 40.0 and over, adult: Secondary | ICD-10-CM | POA: Diagnosis not present

## 2023-10-19 DIAGNOSIS — A419 Sepsis, unspecified organism: Secondary | ICD-10-CM | POA: Diagnosis not present

## 2023-10-19 DIAGNOSIS — M793 Panniculitis, unspecified: Secondary | ICD-10-CM | POA: Diagnosis not present

## 2023-10-20 DIAGNOSIS — I10 Essential (primary) hypertension: Secondary | ICD-10-CM | POA: Diagnosis not present

## 2023-10-20 DIAGNOSIS — Z9181 History of falling: Secondary | ICD-10-CM | POA: Diagnosis not present

## 2023-10-20 DIAGNOSIS — M6282 Rhabdomyolysis: Secondary | ICD-10-CM | POA: Diagnosis not present

## 2023-10-20 DIAGNOSIS — L304 Erythema intertrigo: Secondary | ICD-10-CM | POA: Diagnosis not present

## 2023-10-20 DIAGNOSIS — A419 Sepsis, unspecified organism: Secondary | ICD-10-CM | POA: Diagnosis not present

## 2023-10-20 DIAGNOSIS — I714 Abdominal aortic aneurysm, without rupture, unspecified: Secondary | ICD-10-CM | POA: Diagnosis not present

## 2023-10-20 DIAGNOSIS — G4733 Obstructive sleep apnea (adult) (pediatric): Secondary | ICD-10-CM | POA: Diagnosis not present

## 2023-10-20 DIAGNOSIS — L89150 Pressure ulcer of sacral region, unstageable: Secondary | ICD-10-CM | POA: Diagnosis not present

## 2023-10-20 DIAGNOSIS — M793 Panniculitis, unspecified: Secondary | ICD-10-CM | POA: Diagnosis not present

## 2023-10-20 DIAGNOSIS — E785 Hyperlipidemia, unspecified: Secondary | ICD-10-CM | POA: Diagnosis not present

## 2023-10-20 DIAGNOSIS — Z6841 Body Mass Index (BMI) 40.0 and over, adult: Secondary | ICD-10-CM | POA: Diagnosis not present

## 2023-10-20 DIAGNOSIS — N179 Acute kidney failure, unspecified: Secondary | ICD-10-CM | POA: Diagnosis not present

## 2023-10-20 DIAGNOSIS — B351 Tinea unguium: Secondary | ICD-10-CM | POA: Diagnosis not present

## 2023-10-22 ENCOUNTER — Emergency Department (HOSPITAL_COMMUNITY)

## 2023-10-22 ENCOUNTER — Institutional Professional Consult (permissible substitution): Admitting: Plastic Surgery

## 2023-10-22 ENCOUNTER — Other Ambulatory Visit: Payer: Self-pay

## 2023-10-22 ENCOUNTER — Ambulatory Visit: Admitting: Podiatry

## 2023-10-22 ENCOUNTER — Inpatient Hospital Stay (HOSPITAL_COMMUNITY)
Admission: EM | Admit: 2023-10-22 | Discharge: 2023-10-25 | DRG: 871 | Disposition: A | Discharge status: Skilled Nursing Facility | Source: Skilled Nursing Facility | Attending: Internal Medicine | Admitting: Internal Medicine

## 2023-10-22 DIAGNOSIS — I272 Pulmonary hypertension, unspecified: Secondary | ICD-10-CM | POA: Diagnosis present

## 2023-10-22 DIAGNOSIS — B965 Pseudomonas (aeruginosa) (mallei) (pseudomallei) as the cause of diseases classified elsewhere: Secondary | ICD-10-CM | POA: Diagnosis present

## 2023-10-22 DIAGNOSIS — I2510 Atherosclerotic heart disease of native coronary artery without angina pectoris: Secondary | ICD-10-CM | POA: Diagnosis present

## 2023-10-22 DIAGNOSIS — L03818 Cellulitis of other sites: Secondary | ICD-10-CM | POA: Diagnosis not present

## 2023-10-22 DIAGNOSIS — R000 Tachycardia, unspecified: Secondary | ICD-10-CM | POA: Diagnosis not present

## 2023-10-22 DIAGNOSIS — Z6841 Body Mass Index (BMI) 40.0 and over, adult: Secondary | ICD-10-CM | POA: Diagnosis not present

## 2023-10-22 DIAGNOSIS — Z803 Family history of malignant neoplasm of breast: Secondary | ICD-10-CM

## 2023-10-22 DIAGNOSIS — K4020 Bilateral inguinal hernia, without obstruction or gangrene, not specified as recurrent: Secondary | ICD-10-CM | POA: Diagnosis not present

## 2023-10-22 DIAGNOSIS — I714 Abdominal aortic aneurysm, without rupture, unspecified: Secondary | ICD-10-CM | POA: Diagnosis not present

## 2023-10-22 DIAGNOSIS — R110 Nausea: Secondary | ICD-10-CM | POA: Diagnosis present

## 2023-10-22 DIAGNOSIS — Z79899 Other long term (current) drug therapy: Secondary | ICD-10-CM

## 2023-10-22 DIAGNOSIS — E875 Hyperkalemia: Secondary | ICD-10-CM | POA: Diagnosis present

## 2023-10-22 DIAGNOSIS — Z8269 Family history of other diseases of the musculoskeletal system and connective tissue: Secondary | ICD-10-CM

## 2023-10-22 DIAGNOSIS — R6520 Severe sepsis without septic shock: Secondary | ICD-10-CM | POA: Diagnosis not present

## 2023-10-22 DIAGNOSIS — M6281 Muscle weakness (generalized): Secondary | ICD-10-CM | POA: Diagnosis not present

## 2023-10-22 DIAGNOSIS — A419 Sepsis, unspecified organism: Principal | ICD-10-CM | POA: Diagnosis present

## 2023-10-22 DIAGNOSIS — Z833 Family history of diabetes mellitus: Secondary | ICD-10-CM | POA: Diagnosis not present

## 2023-10-22 DIAGNOSIS — N179 Acute kidney failure, unspecified: Secondary | ICD-10-CM | POA: Diagnosis present

## 2023-10-22 DIAGNOSIS — R0902 Hypoxemia: Secondary | ICD-10-CM | POA: Diagnosis not present

## 2023-10-22 DIAGNOSIS — E669 Obesity, unspecified: Secondary | ICD-10-CM | POA: Diagnosis not present

## 2023-10-22 DIAGNOSIS — I959 Hypotension, unspecified: Secondary | ICD-10-CM | POA: Diagnosis present

## 2023-10-22 DIAGNOSIS — K5730 Diverticulosis of large intestine without perforation or abscess without bleeding: Secondary | ICD-10-CM | POA: Diagnosis not present

## 2023-10-22 DIAGNOSIS — I10 Essential (primary) hypertension: Secondary | ICD-10-CM | POA: Diagnosis not present

## 2023-10-22 DIAGNOSIS — E66813 Obesity, class 3: Secondary | ICD-10-CM | POA: Diagnosis present

## 2023-10-22 DIAGNOSIS — M109 Gout, unspecified: Secondary | ICD-10-CM | POA: Diagnosis present

## 2023-10-22 DIAGNOSIS — Z8546 Personal history of malignant neoplasm of prostate: Secondary | ICD-10-CM

## 2023-10-22 DIAGNOSIS — J9601 Acute respiratory failure with hypoxia: Secondary | ICD-10-CM | POA: Diagnosis not present

## 2023-10-22 DIAGNOSIS — Z7982 Long term (current) use of aspirin: Secondary | ICD-10-CM

## 2023-10-22 DIAGNOSIS — R262 Difficulty in walking, not elsewhere classified: Secondary | ICD-10-CM | POA: Diagnosis not present

## 2023-10-22 DIAGNOSIS — I700 Atherosclerosis of aorta: Secondary | ICD-10-CM | POA: Diagnosis not present

## 2023-10-22 DIAGNOSIS — R319 Hematuria, unspecified: Secondary | ICD-10-CM

## 2023-10-22 DIAGNOSIS — G9341 Metabolic encephalopathy: Secondary | ICD-10-CM | POA: Diagnosis not present

## 2023-10-22 DIAGNOSIS — L89152 Pressure ulcer of sacral region, stage 2: Secondary | ICD-10-CM | POA: Diagnosis present

## 2023-10-22 DIAGNOSIS — M793 Panniculitis, unspecified: Secondary | ICD-10-CM | POA: Diagnosis not present

## 2023-10-22 DIAGNOSIS — D509 Iron deficiency anemia, unspecified: Secondary | ICD-10-CM | POA: Diagnosis not present

## 2023-10-22 DIAGNOSIS — G4733 Obstructive sleep apnea (adult) (pediatric): Secondary | ICD-10-CM | POA: Diagnosis present

## 2023-10-22 DIAGNOSIS — Z860100 Personal history of colon polyps, unspecified: Secondary | ICD-10-CM

## 2023-10-22 DIAGNOSIS — E538 Deficiency of other specified B group vitamins: Secondary | ICD-10-CM | POA: Diagnosis not present

## 2023-10-22 DIAGNOSIS — R0689 Other abnormalities of breathing: Secondary | ICD-10-CM | POA: Diagnosis not present

## 2023-10-22 DIAGNOSIS — N390 Urinary tract infection, site not specified: Secondary | ICD-10-CM | POA: Diagnosis not present

## 2023-10-22 DIAGNOSIS — Z888 Allergy status to other drugs, medicaments and biological substances status: Secondary | ICD-10-CM | POA: Diagnosis not present

## 2023-10-22 DIAGNOSIS — E785 Hyperlipidemia, unspecified: Secondary | ICD-10-CM | POA: Diagnosis present

## 2023-10-22 DIAGNOSIS — M6282 Rhabdomyolysis: Secondary | ICD-10-CM | POA: Diagnosis not present

## 2023-10-22 DIAGNOSIS — N3289 Other specified disorders of bladder: Secondary | ICD-10-CM | POA: Diagnosis not present

## 2023-10-22 DIAGNOSIS — Z809 Family history of malignant neoplasm, unspecified: Secondary | ICD-10-CM | POA: Diagnosis not present

## 2023-10-22 DIAGNOSIS — M314 Aortic arch syndrome [Takayasu]: Secondary | ICD-10-CM | POA: Diagnosis not present

## 2023-10-22 DIAGNOSIS — B351 Tinea unguium: Secondary | ICD-10-CM | POA: Diagnosis not present

## 2023-10-22 DIAGNOSIS — R935 Abnormal findings on diagnostic imaging of other abdominal regions, including retroperitoneum: Secondary | ICD-10-CM | POA: Diagnosis not present

## 2023-10-22 DIAGNOSIS — R0602 Shortness of breath: Secondary | ICD-10-CM | POA: Diagnosis not present

## 2023-10-22 DIAGNOSIS — E559 Vitamin D deficiency, unspecified: Secondary | ICD-10-CM | POA: Diagnosis not present

## 2023-10-22 DIAGNOSIS — J45909 Unspecified asthma, uncomplicated: Secondary | ICD-10-CM | POA: Diagnosis present

## 2023-10-22 DIAGNOSIS — Z881 Allergy status to other antibiotic agents status: Secondary | ICD-10-CM | POA: Diagnosis not present

## 2023-10-22 DIAGNOSIS — Z9181 History of falling: Secondary | ICD-10-CM | POA: Diagnosis not present

## 2023-10-22 DIAGNOSIS — R0989 Other specified symptoms and signs involving the circulatory and respiratory systems: Secondary | ICD-10-CM | POA: Diagnosis not present

## 2023-10-22 DIAGNOSIS — I517 Cardiomegaly: Secondary | ICD-10-CM | POA: Diagnosis not present

## 2023-10-22 DIAGNOSIS — Z7401 Bed confinement status: Secondary | ICD-10-CM | POA: Diagnosis not present

## 2023-10-22 LAB — CBC WITH DIFFERENTIAL/PLATELET
Abs Immature Granulocytes: 0.08 K/uL — ABNORMAL HIGH (ref 0.00–0.07)
Basophils Absolute: 0 K/uL (ref 0.0–0.1)
Basophils Relative: 0 %
Eosinophils Absolute: 0 K/uL (ref 0.0–0.5)
Eosinophils Relative: 0 %
HCT: 33.1 % — ABNORMAL LOW (ref 39.0–52.0)
Hemoglobin: 9.8 g/dL — ABNORMAL LOW (ref 13.0–17.0)
Immature Granulocytes: 1 %
Lymphocytes Relative: 7 %
Lymphs Abs: 1 K/uL (ref 0.7–4.0)
MCH: 26.9 pg (ref 26.0–34.0)
MCHC: 29.6 g/dL — ABNORMAL LOW (ref 30.0–36.0)
MCV: 90.9 fL (ref 80.0–100.0)
Monocytes Absolute: 1.1 K/uL — ABNORMAL HIGH (ref 0.1–1.0)
Monocytes Relative: 8 %
Neutro Abs: 12.5 K/uL — ABNORMAL HIGH (ref 1.7–7.7)
Neutrophils Relative %: 84 %
Platelets: 370 K/uL (ref 150–400)
RBC: 3.64 MIL/uL — ABNORMAL LOW (ref 4.22–5.81)
RDW: 16.9 % — ABNORMAL HIGH (ref 11.5–15.5)
WBC: 14.6 K/uL — ABNORMAL HIGH (ref 4.0–10.5)
nRBC: 0 % (ref 0.0–0.2)

## 2023-10-22 LAB — URINALYSIS, W/ REFLEX TO CULTURE (INFECTION SUSPECTED)
Bilirubin Urine: NEGATIVE
Glucose, UA: NEGATIVE mg/dL
Ketones, ur: NEGATIVE mg/dL
Nitrite: POSITIVE — AB
Protein, ur: 100 mg/dL — AB
RBC / HPF: >50 RBC/hpf (ref 0–5)
Specific Gravity, Urine: 1.009 (ref 1.005–1.030)
WBC, UA: >50 WBC/hpf (ref 0–5)
pH: 7 (ref 5.0–8.0)

## 2023-10-22 LAB — COMPREHENSIVE METABOLIC PANEL WITH GFR
ALT: 30 U/L (ref 0–44)
AST: 38 U/L (ref 15–41)
Albumin: 2.4 g/dL — ABNORMAL LOW (ref 3.5–5.0)
Alkaline Phosphatase: 94 U/L (ref 38–126)
Anion gap: 11 (ref 5–15)
BUN: 30 mg/dL — ABNORMAL HIGH (ref 8–23)
CO2: 29 mmol/L (ref 22–32)
Calcium: 9.8 mg/dL (ref 8.9–10.3)
Chloride: 98 mmol/L (ref 98–111)
Creatinine, Ser: 1.15 mg/dL (ref 0.61–1.24)
GFR, Estimated: >60 mL/min (ref >60)
Glucose, Bld: 104 mg/dL — ABNORMAL HIGH (ref 70–99)
Potassium: 5.4 mmol/L — ABNORMAL HIGH (ref 3.5–5.1)
Sodium: 138 mmol/L (ref 135–145)
Total Bilirubin: 0.7 mg/dL (ref 0.0–1.2)
Total Protein: 7.7 g/dL (ref 6.5–8.1)

## 2023-10-22 LAB — I-STAT CG4 LACTIC ACID, ED: Lactic Acid, Venous: 1.5 mmol/L (ref 0.5–1.9)

## 2023-10-22 MED ORDER — VANCOMYCIN HCL 2000 MG/400ML IV SOLN
2000.0000 mg | Freq: Once | INTRAVENOUS | Status: AC
  Administered 2023-10-22: 2000 mg via INTRAVENOUS
  Filled 2023-10-22: qty 400

## 2023-10-22 MED ORDER — SIMETHICONE 80 MG PO CHEW
80.0000 mg | CHEWABLE_TABLET | Freq: Once | ORAL | Status: AC
  Administered 2023-10-22: 80 mg via ORAL
  Filled 2023-10-22: qty 1

## 2023-10-22 MED ORDER — LACTATED RINGERS IV BOLUS (SEPSIS)
1000.0000 mL | Freq: Once | INTRAVENOUS | Status: AC
  Administered 2023-10-22: 1000 mL via INTRAVENOUS

## 2023-10-22 MED ORDER — LACTATED RINGERS IV BOLUS
1000.0000 mL | Freq: Once | INTRAVENOUS | Status: AC
  Administered 2023-10-22: 1000 mL via INTRAVENOUS

## 2023-10-22 MED ORDER — IOHEXOL 350 MG/ML SOLN
100.0000 mL | Freq: Once | INTRAVENOUS | Status: AC | PRN
Start: 2023-10-22 — End: 2023-10-22
  Administered 2023-10-22: 100 mL via INTRAVENOUS

## 2023-10-22 MED ORDER — CALCIUM CARBONATE ANTACID 500 MG PO CHEW
1.0000 | CHEWABLE_TABLET | Freq: Once | ORAL | Status: AC
  Administered 2023-10-22: 200 mg via ORAL
  Filled 2023-10-22: qty 1

## 2023-10-22 MED ORDER — LACTATED RINGERS IV BOLUS
500.0000 mL | Freq: Once | INTRAVENOUS | Status: AC
  Administered 2023-10-22: 500 mL via INTRAVENOUS

## 2023-10-22 MED ORDER — ACETAMINOPHEN 500 MG PO TABS
1000.0000 mg | ORAL_TABLET | Freq: Once | ORAL | Status: AC
  Administered 2023-10-22: 1000 mg via ORAL
  Filled 2023-10-22: qty 2

## 2023-10-22 MED ORDER — SODIUM CHLORIDE 0.9 % IV SOLN
2.0000 g | Freq: Once | INTRAVENOUS | Status: AC
  Administered 2023-10-22: 2 g via INTRAVENOUS
  Filled 2023-10-22: qty 20

## 2023-10-22 MED ORDER — VANCOMYCIN HCL IN DEXTROSE 1-5 GM/200ML-% IV SOLN: 1000.0000 mg | Freq: Once | INTRAVENOUS | Status: DC

## 2023-10-22 NOTE — ED Notes (Signed)
 Patient transported to CT

## 2023-10-22 NOTE — ED Triage Notes (Signed)
 Pt BIB EMS from local SNF with CC of AMS. Pt was treated a month ago for sepsis. EMS reports pt son states he is usually Aox4. Pt oriented only to self.

## 2023-10-22 NOTE — ED Notes (Addendum)
 Garrick MD placed verbal order for TUMS.

## 2023-10-22 NOTE — ED Notes (Signed)
 IV team at the bedside, this nurse update EDP Garrick MD and resident Raoul, MD regarding patient's manual blood pressure of 86/50.

## 2023-10-22 NOTE — ED Provider Notes (Signed)
 [place] [facility] DE[facility]MENT AT [facility] Provider Note   CSN: [id number] Arrival date & time: 10/22/23  1600     Patient presents with: Altered Mental Status   Curtis Hill is a 78 y.o. male with PMHx hypertension, OSA, morbid obesity, thoracic aortic aneurysm, pulmonary HTN, and aortic stenosis presenting from facility for concern for sepsis.  Patient's baseline is fully alert and oriented, however today was noted by family member and staff to be confused.  On evaluation patient is unable to state why he is in the ED, has no active complaints and denies feeling confused.  Per EMS, there was concern for possible cellulitis of his pannus by his facility.  Patient was recently admitted here for similar presentation with sepsis from panniculitis from 5/24 - 6/2.    Prior to Admission medications   Medication Sig Start Date End Date Taking? Authorizing Provider  albuterol  (VENTOLIN  HFA) 108 (90 Base) MCG/ACT inhaler Inhale 2 puffs into the lungs every 6 (six) hours as needed for wheezing or shortness of breath.    [provider]  allopurinol  (ZYLOPRIM ) 300 MG tablet Take 300 mg by mouth daily.    [provider]  aspirin  EC 81 MG tablet Take 81 mg by mouth daily. Swallow whole. Patient not taking: Reported on 09/05/2023    [provider]  atorvastatin  (LIPITOR) 20 MG tablet Take 20 mg by mouth every evening.    [provider]  Calcium  Carbonate (CALTRATE 600 PO) Take 1 tablet by mouth daily at 12 noon.    [provider]  carvedilol  (COREG ) 6.25 MG tablet Take 1 tablet (6.25 mg total) by mouth 2 (two) times daily with a meal. 09/13/23   Wouk, Devaughn Sayres, MD  cholecalciferol (VITAMIN D3) 25 MCG (1000 UNIT) tablet Take 1,000 Units by mouth daily.    [provider]  Coenzyme Q10 (COQ10) 200 MG CAPS Take 1 capsule by mouth in the morning.    [provider]  cyanocobalamin (VITAMIN B12) 500 MCG tablet Take 1,000 mcg  by mouth daily. 09/09/17   [provider]  furosemide  (LASIX ) 40 MG tablet Take 40 mg by mouth daily as needed for edema or fluid.    [provider]  Magnesium  200 MG TABS Take 400 mg by mouth daily at 12 noon.    [provider]  Multiple Vitamins-Minerals (CENTRUM SILVER 50+MEN) TABS Take 1 tablet by mouth in the morning.    [provider]  nystatin  (MYCOSTATIN /NYSTOP ) powder Apply topically 3 (three) times daily. 09/13/23   Wouk, Devaughn Sayres, MD  traMADol  (ULTRAM ) 50 MG tablet Take 1 tablet (50 mg total) by mouth 2 (two) times daily as needed for moderate pain (pain score 4-6) or severe pain (pain score 7-10). 09/13/23   Wouk, Devaughn Sayres, MD  valsartan (DIOVAN) 320 MG tablet Take 320 mg by mouth daily.    [provider]    Allergies: Levofloxacin , Lisinopril, and Prednisone      Updated Vital Signs BP 123/71   Pulse (!) 114   Temp (!) 102.1 F (38.9 C) (Oral)   Resp (!) 26   Wt (!) 211 kg   SpO2 94%   BMI 72.86 kg/m   Physical Exam Vitals reviewed.  Constitutional:      Appearance: He is morbidly obese. He is ill-appearing. He is not toxic-appearing or diaphoretic.  HENT:     Head: Normocephalic and atraumatic.     Nose: Nose normal. No rhinorrhea.  Mouth/Throat:     Mouth: Mucous membranes are moist.     Pharynx: Oropharynx is clear.  Eyes:     General: No scleral icterus.    Extraocular Movements: Extraocular movements intact.  Cardiovascular:     Rate and Rhythm: Regular rhythm. Tachycardia present.     Pulses:          Radial pulses are 2+ on the right side and 2+ on the left side.       Dorsalis pedis pulses are 1+ on the right side and 1+ on the left side.     Heart sounds: No murmur heard.    No gallop.  Pulmonary:     Effort: Pulmonary effort is normal. No respiratory distress.     Breath sounds: No stridor. No wheezing or rhonchi.  Abdominal:     General: Abdomen is protuberant.     Palpations: Abdomen is  soft.     Tenderness: There is no abdominal tenderness. There is no guarding.     Comments: Large pannus with diffuse scaling and malodor but no significant purulence/drainage  Genitourinary:    Comments: Buried penis iso large pannus Musculoskeletal:        General: No deformity.     Cervical back: Neck supple. No rigidity.     Right lower leg: 2+ Edema present.     Left lower leg: 2+ Edema present.  Skin:    General: Skin is warm and dry.     Capillary Refill: Capillary refill takes less than 2 seconds.     Findings: Rash (concern for panniculitis as above) present.  Neurological:     Mental Status: He is alert.     Comments: Oriented only to self, disoriented to time, place, and context. Unable to participate in focused neuro exam d/t AMS. No obvious facial droop     (all labs ordered are listed, but only abnormal results are displayed) Labs Reviewed  URINALYSIS, W/ REFLEX TO CULTURE (INFECTION SUSPECTED) - Abnormal; Notable for the following components:      Result Value   APPearance CLOUDY (*)    Hgb urine dipstick LARGE (*)    Protein, ur 100 (*)    Nitrite POSITIVE (*)    Leukocytes,Ua MODERATE (*)    Bacteria, UA MANY (*)    All other components within normal limits  CULTURE, BLOOD (ROUTINE X 2)  CULTURE, BLOOD (ROUTINE X 2)  URINE CULTURE  COMPREHENSIVE METABOLIC PANEL WITH GFR  CBC WITH DIFFERENTIAL/PLATELET  I-STAT CG4 LACTIC ACID, ED  I-STAT CG4 LACTIC ACID, ED    EKG: None  Radiology: DG Chest Port 1 View Result Date: 10/22/2023 CLINICAL DATA:  Sepsis, hypertension EXAM: PORTABLE CHEST 1 VIEW COMPARISON:  09/07/2023 FINDINGS: The patient is rotated to the right on today's radiograph, reducing diagnostic sensitivity and specificity. Atherosclerotic calcification of the aortic arch. Mild cardiomegaly. Low lung volumes are present, causing crowding of the pulmonary vasculature. Accounting for the low lung volumes, no discrete airspace opacity is identified. No  blunting of the costophrenic angles. IMPRESSION: 1. Low lung volumes, causing crowding of the pulmonary vasculature. 2. Mild cardiomegaly. 3. Aortic Atherosclerosis (ICD10-I70.0). Electronically Signed   By: Ryan Salvage M.D.   On: 10/22/2023 17:23     Medications Ordered in the ED  cefTRIAXone  (ROCEPHIN ) 2 g in sodium chloride  0.9 % 100 mL IVPB (has no administration in time range)  lactated ringers  bolus 1,000 mL (has no administration in time range)  acetaminophen  (TYLENOL ) tablet 1,000 mg (has no  administration in time range)  lactated ringers  bolus 1,000 mL (1,000 mLs Intravenous New Bag/Given 10/22/23 1700)    Clinical Course as of 10/22/23 2130  Fri Oct 22, 2023  1850 Urinalysis, w/ Reflex to Culture (Infection Suspected) -Urine, Catheterized(!) Infectious UA, concern for source of sepsis. Will initiate rocephin  [AD]  1852 Febrile to 102.1 [AD]  1852 DG Chest Port 1 View Low lung volumes, causing crowding of the pulmonary vasculature. Mild cardiomegaly. Aortic Atherosclerosis   [AD]  1925 WBC(!): 14.6 [AD]  2122 CT ABDOMEN PELVIS W CONTRAST 1. Skin thickening and subcutaneous stranding in the inferior right pannus worrisome for cellulitis. No focal fluid collections. 2. Distended bladder. 3. Colonic diverticulosis. 4. Prominent bilateral inguinal lymph nodes, likely reactive.   [AD]    Clinical Course User Index [AD] Raoul Rake, MD   {Click here for ABCD2, HEART and other calculators REFRESH Note before signing:1}                              Medical Decision Making Amount and/or Complexity of Data Reviewed Labs: ordered. Decision-making details documented in ED Course. Radiology: ordered. Decision-making details documented in ED Course.  Risk OTC drugs.   ***  {Document critical care time when appropriate  Document review of labs and clinical decision tools ie CHADS2VASC2, etc  Document your independent review of radiology images and any outside  records  Document your discussion with family members, caretakers and with consultants  Document social determinants of health affecting pt's care  Document your decision making why or why not admission, treatments were needed:[id number]:::1}   Final diagnoses:  None    ED Discharge Orders     None

## 2023-10-22 NOTE — ED Notes (Signed)
 Unsuccessful blood draw, unable to obtain 2nd set of blood cultures.  RN aware.   KM

## 2023-10-22 NOTE — ED Notes (Signed)
 Patient given fan by this nurse along with in-room phone so patient can call son.

## 2023-10-22 NOTE — ED Notes (Signed)
 This nurse alerted resident Dombroski, MD regarding patient's systolic blood pressure was 81 and MAP was 66. Resident placed appropriate orders in and verbally ordered this nurse to monitor patient's blood pressure closely and ensure MAP is staying above 65.

## 2023-10-22 NOTE — ED Notes (Signed)
 Patient has returned from CT

## 2023-10-23 ENCOUNTER — Inpatient Hospital Stay (HOSPITAL_COMMUNITY)

## 2023-10-23 DIAGNOSIS — M109 Gout, unspecified: Secondary | ICD-10-CM | POA: Diagnosis present

## 2023-10-23 DIAGNOSIS — N390 Urinary tract infection, site not specified: Secondary | ICD-10-CM | POA: Diagnosis not present

## 2023-10-23 DIAGNOSIS — M6281 Muscle weakness (generalized): Secondary | ICD-10-CM | POA: Diagnosis not present

## 2023-10-23 DIAGNOSIS — N179 Acute kidney failure, unspecified: Secondary | ICD-10-CM | POA: Diagnosis not present

## 2023-10-23 DIAGNOSIS — B351 Tinea unguium: Secondary | ICD-10-CM | POA: Diagnosis not present

## 2023-10-23 DIAGNOSIS — A419 Sepsis, unspecified organism: Secondary | ICD-10-CM | POA: Diagnosis present

## 2023-10-23 DIAGNOSIS — B965 Pseudomonas (aeruginosa) (mallei) (pseudomallei) as the cause of diseases classified elsewhere: Secondary | ICD-10-CM | POA: Diagnosis present

## 2023-10-23 DIAGNOSIS — Z6841 Body Mass Index (BMI) 40.0 and over, adult: Secondary | ICD-10-CM | POA: Diagnosis not present

## 2023-10-23 DIAGNOSIS — Z888 Allergy status to other drugs, medicaments and biological substances status: Secondary | ICD-10-CM | POA: Diagnosis not present

## 2023-10-23 DIAGNOSIS — Z809 Family history of malignant neoplasm, unspecified: Secondary | ICD-10-CM | POA: Diagnosis not present

## 2023-10-23 DIAGNOSIS — Z8269 Family history of other diseases of the musculoskeletal system and connective tissue: Secondary | ICD-10-CM | POA: Diagnosis not present

## 2023-10-23 DIAGNOSIS — I517 Cardiomegaly: Secondary | ICD-10-CM

## 2023-10-23 DIAGNOSIS — I10 Essential (primary) hypertension: Secondary | ICD-10-CM | POA: Diagnosis present

## 2023-10-23 DIAGNOSIS — M793 Panniculitis, unspecified: Secondary | ICD-10-CM | POA: Diagnosis not present

## 2023-10-23 DIAGNOSIS — E66813 Obesity, class 3: Secondary | ICD-10-CM | POA: Diagnosis present

## 2023-10-23 DIAGNOSIS — L03818 Cellulitis of other sites: Secondary | ICD-10-CM | POA: Diagnosis not present

## 2023-10-23 DIAGNOSIS — M6282 Rhabdomyolysis: Secondary | ICD-10-CM | POA: Diagnosis not present

## 2023-10-23 DIAGNOSIS — G4733 Obstructive sleep apnea (adult) (pediatric): Secondary | ICD-10-CM | POA: Diagnosis not present

## 2023-10-23 DIAGNOSIS — Z803 Family history of malignant neoplasm of breast: Secondary | ICD-10-CM | POA: Diagnosis not present

## 2023-10-23 DIAGNOSIS — E785 Hyperlipidemia, unspecified: Secondary | ICD-10-CM | POA: Diagnosis present

## 2023-10-23 DIAGNOSIS — J9601 Acute respiratory failure with hypoxia: Secondary | ICD-10-CM | POA: Diagnosis present

## 2023-10-23 DIAGNOSIS — R6520 Severe sepsis without septic shock: Secondary | ICD-10-CM | POA: Diagnosis present

## 2023-10-23 DIAGNOSIS — G9341 Metabolic encephalopathy: Secondary | ICD-10-CM | POA: Diagnosis not present

## 2023-10-23 DIAGNOSIS — E875 Hyperkalemia: Secondary | ICD-10-CM | POA: Diagnosis present

## 2023-10-23 DIAGNOSIS — Z881 Allergy status to other antibiotic agents status: Secondary | ICD-10-CM | POA: Diagnosis not present

## 2023-10-23 DIAGNOSIS — M314 Aortic arch syndrome [Takayasu]: Secondary | ICD-10-CM | POA: Diagnosis not present

## 2023-10-23 DIAGNOSIS — Z9181 History of falling: Secondary | ICD-10-CM | POA: Diagnosis not present

## 2023-10-23 DIAGNOSIS — L89152 Pressure ulcer of sacral region, stage 2: Secondary | ICD-10-CM | POA: Diagnosis present

## 2023-10-23 DIAGNOSIS — Z833 Family history of diabetes mellitus: Secondary | ICD-10-CM | POA: Diagnosis not present

## 2023-10-23 DIAGNOSIS — J45909 Unspecified asthma, uncomplicated: Secondary | ICD-10-CM | POA: Diagnosis not present

## 2023-10-23 DIAGNOSIS — I272 Pulmonary hypertension, unspecified: Secondary | ICD-10-CM | POA: Diagnosis present

## 2023-10-23 DIAGNOSIS — D509 Iron deficiency anemia, unspecified: Secondary | ICD-10-CM | POA: Diagnosis present

## 2023-10-23 DIAGNOSIS — I714 Abdominal aortic aneurysm, without rupture, unspecified: Secondary | ICD-10-CM | POA: Diagnosis not present

## 2023-10-23 DIAGNOSIS — I2510 Atherosclerotic heart disease of native coronary artery without angina pectoris: Secondary | ICD-10-CM | POA: Diagnosis present

## 2023-10-23 DIAGNOSIS — R262 Difficulty in walking, not elsewhere classified: Secondary | ICD-10-CM | POA: Diagnosis not present

## 2023-10-23 LAB — ECHOCARDIOGRAM COMPLETE
Area-P 1/2: 7.74 cm2
Calc EF: 71.7 %
S' Lateral: 3.9 cm
Single Plane A2C EF: 77 %
Single Plane A4C EF: 70.8 %
Weight: 7442.73 [oz_av]

## 2023-10-23 LAB — CBC
HCT: 31.1 % — ABNORMAL LOW (ref 39.0–52.0)
Hemoglobin: 9.2 g/dL — ABNORMAL LOW (ref 13.0–17.0)
MCH: 26.7 pg (ref 26.0–34.0)
MCHC: 29.6 g/dL — ABNORMAL LOW (ref 30.0–36.0)
MCV: 90.4 fL (ref 80.0–100.0)
Platelets: 364 K/uL (ref 150–400)
RBC: 3.44 MIL/uL — ABNORMAL LOW (ref 4.22–5.81)
RDW: 17.1 % — ABNORMAL HIGH (ref 11.5–15.5)
WBC: 16 K/uL — ABNORMAL HIGH (ref 4.0–10.5)
nRBC: 0 % (ref 0.0–0.2)

## 2023-10-23 LAB — COMPREHENSIVE METABOLIC PANEL WITH GFR
ALT: 33 U/L (ref 0–44)
AST: 37 U/L (ref 15–41)
Albumin: 2.4 g/dL — ABNORMAL LOW (ref 3.5–5.0)
Alkaline Phosphatase: 89 U/L (ref 38–126)
Anion gap: 10 (ref 5–15)
BUN: 32 mg/dL — ABNORMAL HIGH (ref 8–23)
CO2: 28 mmol/L (ref 22–32)
Calcium: 9.5 mg/dL (ref 8.9–10.3)
Chloride: 98 mmol/L (ref 98–111)
Creatinine, Ser: 1.29 mg/dL — ABNORMAL HIGH (ref 0.61–1.24)
GFR, Estimated: 57 mL/min — ABNORMAL LOW (ref >60)
Glucose, Bld: 109 mg/dL — ABNORMAL HIGH (ref 70–99)
Potassium: 4.7 mmol/L (ref 3.5–5.1)
Sodium: 136 mmol/L (ref 135–145)
Total Bilirubin: 0.6 mg/dL (ref 0.0–1.2)
Total Protein: 7.3 g/dL (ref 6.5–8.1)

## 2023-10-23 LAB — TROPONIN I (HIGH SENSITIVITY)
Troponin I (High Sensitivity): 37 ng/L — ABNORMAL HIGH (ref <18)
Troponin I (High Sensitivity): 36 ng/L — ABNORMAL HIGH (ref <18)

## 2023-10-23 LAB — BASIC METABOLIC PANEL WITH GFR
Anion gap: 7 (ref 5–15)
BUN: 32 mg/dL — ABNORMAL HIGH (ref 8–23)
CO2: 28 mmol/L (ref 22–32)
Calcium: 8.9 mg/dL (ref 8.9–10.3)
Chloride: 98 mmol/L (ref 98–111)
Creatinine, Ser: 1.31 mg/dL — ABNORMAL HIGH (ref 0.61–1.24)
GFR, Estimated: 56 mL/min — ABNORMAL LOW (ref >60)
Glucose, Bld: 129 mg/dL — ABNORMAL HIGH (ref 70–99)
Potassium: 5 mmol/L (ref 3.5–5.1)
Sodium: 133 mmol/L — ABNORMAL LOW (ref 135–145)

## 2023-10-23 LAB — GLUCOSE, CAPILLARY: Glucose-Capillary: 139 mg/dL — ABNORMAL HIGH (ref 70–99)

## 2023-10-23 LAB — CREATININE, SERUM
Creatinine, Ser: 1.37 mg/dL — ABNORMAL HIGH (ref 0.61–1.24)
GFR, Estimated: 53 mL/min — ABNORMAL LOW (ref >60)

## 2023-10-23 LAB — PROCALCITONIN: Procalcitonin: 0.44 ng/mL

## 2023-10-23 LAB — CBG MONITORING, ED: Glucose-Capillary: 99 mg/dL (ref 70–99)

## 2023-10-23 LAB — BRAIN NATRIURETIC PEPTIDE: B Natriuretic Peptide: 234.3 pg/mL — ABNORMAL HIGH (ref 0.0–100.0)

## 2023-10-23 MED ORDER — ACETAMINOPHEN 650 MG RE SUPP: 650.0000 mg | Freq: Four times a day (QID) | RECTAL | Status: DC | PRN

## 2023-10-23 MED ORDER — VANCOMYCIN HCL 2000 MG/400ML IV SOLN
2000.0000 mg | INTRAVENOUS | Status: DC
  Administered 2023-10-23 – 2023-10-24 (×2): 2000 mg via INTRAVENOUS
  Filled 2023-10-23 (×3): qty 400

## 2023-10-23 MED ORDER — ACETAMINOPHEN 325 MG PO TABS
650.0000 mg | ORAL_TABLET | Freq: Four times a day (QID) | ORAL | Status: DC | PRN
  Administered 2023-10-23 – 2023-10-25 (×2): 650 mg via ORAL
  Filled 2023-10-23 (×2): qty 2

## 2023-10-23 MED ORDER — ONDANSETRON HCL 4 MG PO TABS: 4.0000 mg | ORAL_TABLET | Freq: Four times a day (QID) | ORAL | Status: DC | PRN

## 2023-10-23 MED ORDER — ALLOPURINOL 300 MG PO TABS
300.0000 mg | ORAL_TABLET | Freq: Every day | ORAL | Status: DC
  Administered 2023-10-23 – 2023-10-25 (×3): 300 mg via ORAL
  Filled 2023-10-23 (×2): qty 1
  Filled 2023-10-23: qty 3

## 2023-10-23 MED ORDER — ALBUTEROL SULFATE (2.5 MG/3ML) 0.083% IN NEBU: 2.5000 mg | INHALATION_SOLUTION | RESPIRATORY_TRACT | Status: DC | PRN

## 2023-10-23 MED ORDER — ONDANSETRON HCL 4 MG/2ML IJ SOLN: 4.0000 mg | Freq: Four times a day (QID) | INTRAMUSCULAR | Status: DC | PRN

## 2023-10-23 MED ORDER — PERFLUTREN LIPID MICROSPHERE
1.0000 mL | INTRAVENOUS | Status: AC | PRN
  Administered 2023-10-23: 1.5 mL via INTRAVENOUS

## 2023-10-23 MED ORDER — ATORVASTATIN CALCIUM 10 MG PO TABS
20.0000 mg | ORAL_TABLET | Freq: Every evening | ORAL | Status: DC
  Administered 2023-10-23 – 2023-10-24 (×2): 20 mg via ORAL
  Filled 2023-10-23 (×2): qty 2

## 2023-10-23 MED ORDER — LACTATED RINGERS IV SOLN: INTRAVENOUS | Status: AC

## 2023-10-23 MED ORDER — GERHARDT'S BUTT CREAM
TOPICAL_CREAM | Freq: Three times a day (TID) | CUTANEOUS | Status: DC
  Administered 2023-10-24: 1 via TOPICAL
  Filled 2023-10-23 (×2): qty 60

## 2023-10-23 MED ORDER — SODIUM CHLORIDE 0.9 % IV SOLN
1.0000 g | INTRAVENOUS | Status: DC
  Administered 2023-10-23: 1 g via INTRAVENOUS
  Filled 2023-10-23: qty 10

## 2023-10-23 MED ORDER — HEPARIN SODIUM (PORCINE) 5000 UNIT/ML IJ SOLN
5000.0000 [IU] | Freq: Three times a day (TID) | INTRAMUSCULAR | Status: DC
  Administered 2023-10-23 – 2023-10-25 (×7): 5000 [IU] via SUBCUTANEOUS
  Filled 2023-10-23 (×7): qty 1

## 2023-10-23 MED ORDER — ALBUMIN HUMAN 25 % IV SOLN
12.5000 g | Freq: Once | INTRAVENOUS | Status: AC
  Administered 2023-10-23: 12.5 g via INTRAVENOUS
  Filled 2023-10-23: qty 50

## 2023-10-23 MED ORDER — COLLAGENASE 250 UNIT/GM EX OINT
TOPICAL_OINTMENT | Freq: Every day | CUTANEOUS | Status: DC
  Filled 2023-10-23 (×3): qty 30

## 2023-10-23 NOTE — Progress Notes (Signed)
 PROGRESS NOTE    AKEEL REFFNER  FMW:[id number] DOB: 05/09/1945 DOA: 10/22/2023 PCP: Okey Carlin Redbird, MD   Brief Narrative:  CLOYCE BLANKENHORN is a 78 y.o. male with medical history significant of  history of morbid obesity, hypertension, OSA on cpap, asthma, prostate cancer, gout IDA,hyperlipidemia,obesity, aortic aneurysm who has recent hospitalization   09/04/23-09/13/2023 with diagnosis of sepsis due to panniculitis /cellulitis for which he was treated with vanc and ceftriaxone  and transitioned to doxycycline  and cefadroxil .  Patient now returns form SNF with complaint of change in mental status.    Assessment & Plan:   Principal Problem:   Sepsis secondary to UTI (HCC)   Severe sepsis in setting of UTI  Rule out subacute vs chronic panniculitis  (Fever/tachycardia/ hypotension/ leukocytosis/encephalopathy) -admit to progressive care -continue on broad spectrum abx with vanc/ctx -trend inflammatory markers  -f/u blood cultures    Acute metabolic encephalopathy, improving Secondary to above - still having some confusion   AKI -avoid nephrotoxic agents -continue on ivfs  OSA -cpap at bedtime    HLD Morbid Obesity Class III   HTN - hold bp medications insetting of hypotension Iron deficiency anemia - h/h stable  Ascending Aortic Aneursym  - no active issues continue semi-annual screening    Hx of prostate ca  Gout  -no acute flare  Mild Asthma -no acute flare   DVT prophylaxis: heparin  injection 5,000 Units Start: 10/23/23 1400 Code Status:   Code Status: Full Code Family Communication: None present  Status is: Inpatient  Dispo: The patient is from: Facility              Anticipated d/c is to: Same              Anticipated d/c date is: 48 to 72 hours              Patient currently not medically stable for discharge  Consultants:  None  Procedures:  None  Antimicrobials:  Ceftriaxone , vancomycin   Subjective: No acute issues or events overnight, mental  status slowly improving but not yet back to baseline, review of systems somewhat limited  Objective: Vitals:   10/23/23 0509 10/23/23 0621 10/23/23 0622 10/23/23 0623  BP:  112/81    Pulse:  (!) 111 (!) 113 (!) 109  Resp:  (!) 24    Temp: 98.3 F (36.8 C)     TempSrc: Oral     SpO2:  96% 93% 96%  Weight:       No intake or output data in the 24 hours ending 10/23/23 0654 Filed Weights   10/22/23 1656  Weight: (!) 211 kg    Examination:  General:  Pleasantly resting in bed, No acute distress. HEENT:  Normocephalic atraumatic.  Sclerae nonicteric, noninjected.  Extraocular movements intact bilaterally. Neck:  Without mass or deformity.  Trachea is midline. Lungs:  Clear to auscultate bilaterally without rhonchi, wheeze, or rales. Heart:  Regular rate and rhythm.  Without murmurs, rubs, or gallops. Abdomen: Soft, nontender large pannus extending inferiorly to the knees with nonblanching mild erythema and chronic skin changes. Extremities: Without cyanosis, clubbing, edema, or obvious deformity. Skin:  Warm and dry, no erythema.  Data Reviewed: I have personally reviewed following labs and imaging studies  CBC: Recent Labs  Lab 10/22/23 1625 10/23/23 0458  WBC 14.6* 16.0*  NEUTROABS 12.5*  --   HGB 9.8* 9.2*  HCT 33.1* 31.1*  MCV 90.9 90.4  PLT 370 364   Basic Metabolic Panel: Recent Labs  Lab 10/22/23 1625 10/23/23 0024 10/23/23 0458  NA 138 133*  --   K 5.4* 5.0  --   CL 98 98  --   CO2 29 28  --   GLUCOSE 104* 129*  --   BUN 30* 32*  --   CREATININE 1.15 1.31* 1.37*  CALCIUM  9.8 8.9  --    GFR: Estimated Creatinine Clearance: 79.3 mL/min (A) (by C-G formula based on SCr of 1.37 mg/dL (H)). Liver Function Tests: Recent Labs  Lab 10/22/23 1625  AST 38  ALT 30  ALKPHOS 94  BILITOT 0.7  PROT 7.7  ALBUMIN  2.4*   Sepsis Labs: Recent Labs  Lab 10/22/23 1633 10/23/23 0024  PROCALCITON  --  0.44  LATICACIDVEN 1.5  --    No results found for this  or any previous visit (from the past 240 hours).   Radiology Studies: CT ABDOMEN PELVIS W CONTRAST Result Date: 10/22/2023 CLINICAL DATA:  Concern for is sepsis with panniculitis. EXAM: CT ABDOMEN AND PELVIS WITH CONTRAST TECHNIQUE: Multidetector CT imaging of the abdomen and pelvis was performed using the standard protocol following bolus administration of intravenous contrast. RADIATION DOSE REDUCTION: This exam was performed according to the departmental dose-optimization program which includes automated exposure control, adjustment of the mA and/or kV according to patient size and/or use of iterative reconstruction technique. CONTRAST:  OMNIPAQUE  IOHEXOL  350 MG/ML SOLN COMPARISON:  CT chest abdomen and pelvis 09/04/2023 FINDINGS: Lower chest: There is atelectasis in the right lung base. Hepatobiliary: No focal liver abnormality is seen. No gallstones, gallbladder wall thickening, or biliary dilatation. Pancreas: Unremarkable. No pancreatic ductal dilatation or surrounding inflammatory changes. Spleen: Normal in size without focal abnormality. Adrenals/Urinary Tract: The bladder is distended. There is no hydronephrosis. There are renal cortical hypodensities bilaterally which are too small to characterize, but favored as cysts. The adrenal glands are within normal limits. Stomach/Bowel: Stomach is within normal limits. Appendix is not seen. No evidence of bowel wall thickening, distention, or inflammatory changes. There is sigmoid colon diverticulosis. Vascular/Lymphatic: Aortic atherosclerosis. No enlarged abdominal or pelvic lymph nodes. Reproductive: Prostate radiotherapy seeds present. Other: There is no ascites. There are small fat containing bilateral inguinal hernias. There are prominent bilateral inguinal lymph nodes measuring up to 11 mm on the right. There some skin thickening of the inferior right pannus was some mild subcutaneous stranding. No focal fluid collections are identified.  Musculoskeletal: Degenerative changes affect the spine. IMPRESSION: 1. Skin thickening and subcutaneous stranding in the inferior right pannus worrisome for cellulitis. No focal fluid collections. 2. Distended bladder. 3. Colonic diverticulosis. 4. Prominent bilateral inguinal lymph nodes, likely reactive. Aortic Atherosclerosis (ICD10-I70.0). Electronically Signed   By: Greig Pique M.D.   On: 10/22/2023 20:56   DG Chest Port 1 View Result Date: 10/22/2023 CLINICAL DATA:  Sepsis, hypertension EXAM: PORTABLE CHEST 1 VIEW COMPARISON:  09/07/2023 FINDINGS: The patient is rotated to the right on today's radiograph, reducing diagnostic sensitivity and specificity. Atherosclerotic calcification of the aortic arch. Mild cardiomegaly. Low lung volumes are present, causing crowding of the pulmonary vasculature. Accounting for the low lung volumes, no discrete airspace opacity is identified. No blunting of the costophrenic angles. IMPRESSION: 1. Low lung volumes, causing crowding of the pulmonary vasculature. 2. Mild cardiomegaly. 3. Aortic Atherosclerosis (ICD10-I70.0). Electronically Signed   By: Ryan Salvage M.D.   On: 10/22/2023 17:23   Scheduled Meds:  allopurinol   300 mg Oral Daily   atorvastatin   20 mg Oral QPM   heparin   5,000  Units Subcutaneous Q8H   Continuous Infusions:  cefTRIAXone  (ROCEPHIN )  IV     lactated ringers  100 mL/hr at 10/23/23 0145   vancomycin        LOS: 0 days   Time spent:  Elsie JAYSON [place], DO [facility]  If 7PM-7AM, please contact night-coverage [email or web address]  10/23/2023, 6:54 AM

## 2023-10-23 NOTE — ED Notes (Signed)
Patient has been changed and repositioned.

## 2023-10-23 NOTE — Consult Note (Addendum)
 WOC Nurse Consult Note: Reason for Consult: MASD  Wound type: 1.  Intertriginous dermatitis under pannus and in groin  ICD-10 CM Codes for Irritant Dermatitis   L30.4  - Erythema intertrigo. Also used for abrasion of the hand, chafing of the skin, dermatitis due to sweating and friction, friction dermatitis, friction eczema, and genital/thigh intertrigo.  2.  Stage 3 pressure Injury sacrum/coccyx  Pressure Injury POA: Yes Measurement: widespread erythema with scattered partial and full thickness skin loss under pannus and in groin; see nursing flowsheet for sacral/coccyx pressure injury  Wound bed: pannus and groin as above, Sacral PI 75% yellow 25% pink  Drainage (amount, consistency, odor) see nursing flowsheet  Periwound: erythema, buttocks with scattered open areas ? Pustules vs MASD  Dressing procedure/placement/frequency:  Cleanse underneath pannus/abdominal folds with Vashe wound cleanser Soila [phone]) do not rinse and allow to air dry.  Can apply a small piece of silver hydrofiber (Aquacel AG Gerlean [phone]) to any open wound beds then cover entire area with Order Gerlean # [phone] Measure and cut length of InterDry to fit in skin folds that have skin breakdown Tuck InterDry fabric into skin folds in a single layer, allow for 2 inches of overhang from skin edges to allow for wicking to occur May remove to bathe; dry area thoroughly and then tuck into affected areas again Do not apply any creams or ointments when using InterDry DO NOT THROW AWAY FOR 5 DAYS unless soiled with stool DO NOT [facility] product, this will inactivate the silver in the material  New sheet of Interdry should be applied after 5 days of use if patient continues to have skin breakdown   Cleanse groin/inner thighs/buttocks  with Vashe and apply Gerhardt's Butt Cream to area 3 times a day and prn soiling  Cleanse sacral wound with Vashe, apply 1/4 thick layer of Santyl  to wound bed, top with saline moist gauze, dry gauze  and then secure with silicone foam or ABD pad whichever is preferred.   POC discussed with bedside nurse. WOC team will not follow. Re-consult if further needs arise.   Thank you,    Powell Bar MSN, RN-BC, [employer]
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
## 2023-10-23 NOTE — ED Notes (Signed)
 Unsuccessful venipuncture...KM

## 2023-10-23 NOTE — ED Notes (Signed)
 Patient has been transferred to a hospital bed and has been changed and repositioned.

## 2023-10-23 NOTE — Progress Notes (Signed)
 Patient brought to 4E from ED. Telemetry box applied, CCMD notified. Patient alert to self only. Call bell in reach.  Rayfield Ishihara, RN    10/23/23 1432  Vitals  Temp (!) 103 F (39.4 C)  Temp Source Oral  BP 112/62  MAP (mmHg) 79  BP Location Right Arm  BP Method Automatic  Patient Position (if appropriate) Lying  Pulse Rate 100  Pulse Rate Source Monitor  ECG Heart Rate 100  Resp 17  MEWS COLOR  MEWS Score Color Yellow  Oxygen Therapy  SpO2 99 %  O2 Device Nasal Cannula  O2 Flow Rate (L/min) 2 L/min  MEWS Score  MEWS Temp 2  MEWS Systolic 0  MEWS Pulse 0  MEWS RR 0  MEWS LOC 0  MEWS Score 2

## 2023-10-23 NOTE — Progress Notes (Signed)
 Pharmacy Antibiotic Note  Curtis Hill is a 78 y.o. male admitted on 10/22/2023 with sepsis.  Pharmacy has been consulted for Vancomycin  dosing. WBC mildly elevated. Scr 1.31.   Plan: Vancomycin  2000 mg IV q24h >>>Estimated AUC: 462 Ceftriaxone  per MD Trend WBC, temp, renal function  F/U infectious work-up Drug levels as indicated   Weight: (!) 211 kg (465 lb 2.7 oz)  Temp (24hrs), Avg:100 F (37.8 C), Min:98.9 F (37.2 C), Max:102.1 F (38.9 C)  Recent Labs  Lab 10/22/23 1625 10/22/23 1633 10/23/23 0024  WBC 14.6*  --   --   CREATININE 1.15  --  1.31*  LATICACIDVEN  --  1.5  --     Estimated Creatinine Clearance: 82.9 mL/min (A) (by C-G formula based on SCr of 1.31 mg/dL (H)).    Allergies  Allergen Reactions   Levofloxacin  Other (See Comments)    Aortic aneurysm   Lisinopril Anaphylaxis, Shortness Of Breath, Swelling and Rash   Prednisone  Swelling    Lynwood Mckusick, PharmD, BCPS Clinical Pharmacist Phone: [phone]

## 2023-10-24 DIAGNOSIS — N179 Acute kidney failure, unspecified: Secondary | ICD-10-CM | POA: Diagnosis not present

## 2023-10-24 DIAGNOSIS — M793 Panniculitis, unspecified: Secondary | ICD-10-CM | POA: Diagnosis not present

## 2023-10-24 DIAGNOSIS — G9341 Metabolic encephalopathy: Secondary | ICD-10-CM | POA: Diagnosis not present

## 2023-10-24 DIAGNOSIS — E875 Hyperkalemia: Secondary | ICD-10-CM

## 2023-10-24 DIAGNOSIS — A419 Sepsis, unspecified organism: Secondary | ICD-10-CM | POA: Diagnosis not present

## 2023-10-24 LAB — BASIC METABOLIC PANEL WITH GFR
Anion gap: 9 (ref 5–15)
BUN: 33 mg/dL — ABNORMAL HIGH (ref 8–23)
CO2: 27 mmol/L (ref 22–32)
Calcium: 8.9 mg/dL (ref 8.9–10.3)
Chloride: 100 mmol/L (ref 98–111)
Creatinine, Ser: 1.22 mg/dL (ref 0.61–1.24)
GFR, Estimated: >60 mL/min (ref >60)
Glucose, Bld: 112 mg/dL — ABNORMAL HIGH (ref 70–99)
Potassium: 4.4 mmol/L (ref 3.5–5.1)
Sodium: 136 mmol/L (ref 135–145)

## 2023-10-24 LAB — CBC
HCT: 26 % — ABNORMAL LOW (ref 39.0–52.0)
Hemoglobin: 7.7 g/dL — ABNORMAL LOW (ref 13.0–17.0)
MCH: 26.8 pg (ref 26.0–34.0)
MCHC: 29.6 g/dL — ABNORMAL LOW (ref 30.0–36.0)
MCV: 90.6 fL (ref 80.0–100.0)
Platelets: 279 K/uL (ref 150–400)
RBC: 2.87 MIL/uL — ABNORMAL LOW (ref 4.22–5.81)
RDW: 17.2 % — ABNORMAL HIGH (ref 11.5–15.5)
WBC: 13.2 K/uL — ABNORMAL HIGH (ref 4.0–10.5)
nRBC: 0 % (ref 0.0–0.2)

## 2023-10-24 LAB — GLUCOSE, CAPILLARY
Glucose-Capillary: 101 mg/dL — ABNORMAL HIGH (ref 70–99)
Glucose-Capillary: 126 mg/dL — ABNORMAL HIGH (ref 70–99)
Glucose-Capillary: 99 mg/dL (ref 70–99)
Glucose-Capillary: 112 mg/dL — ABNORMAL HIGH (ref 70–99)

## 2023-10-24 MED ORDER — SIMETHICONE 80 MG PO CHEW
80.0000 mg | CHEWABLE_TABLET | Freq: Four times a day (QID) | ORAL | Status: DC | PRN
  Administered 2023-10-24: 80 mg via ORAL
  Filled 2023-10-24: qty 1

## 2023-10-24 MED ORDER — CALCIUM CARBONATE ANTACID 500 MG PO CHEW
1.0000 | CHEWABLE_TABLET | Freq: Three times a day (TID) | ORAL | Status: DC | PRN
  Administered 2023-10-24: 200 mg via ORAL
  Filled 2023-10-24: qty 1

## 2023-10-24 MED ORDER — PIPERACILLIN-TAZOBACTAM 3.375 G IVPB
3.3750 g | Freq: Three times a day (TID) | INTRAVENOUS | Status: DC
  Administered 2023-10-24 – 2023-10-25 (×5): 3.375 g via INTRAVENOUS
  Filled 2023-10-24 (×5): qty 50

## 2023-10-24 NOTE — Progress Notes (Signed)
 [facility] night cross cover note:   I was notified by the patient's RN the patient's request for medication to address intestinal gas.   I subsequently ordered prn simethicone  for this purpose.SABRA Eva Pore, DO Hospitalist

## 2023-10-24 NOTE — Plan of Care (Signed)

## 2023-10-24 NOTE — Progress Notes (Signed)
 Wound care performed per MD/WOC order without difficulty.

## 2023-10-24 NOTE — Progress Notes (Signed)
 PROGRESS NOTE    Curtis Hill  FMW:[id number] DOB: [birth year] DOA: 10/22/2023 PCP: Okey Carlin Redbird, MD   Brief Narrative:  Curtis Hill is a 78 y.o. male with medical history significant of  history of morbid obesity, hypertension, OSA on cpap, asthma, prostate cancer, gout IDA,hyperlipidemia,obesity, aortic aneurysm who has recent hospitalization   09/04/23-09/13/2023 with diagnosis of sepsis due to panniculitis /cellulitis for which he was treated with vanc and ceftriaxone  and transitioned to doxycycline  and cefadroxil .  Patient now returns form SNF with complaint of change in mental status.    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  Patient is nonambulatory thus oxygen screen is limited  AKI, improving -avoid nephrotoxic agents -continue on ivfs   OSA -cpap at bedtime    HLD Morbid Obesity Class III   HTN - hold bp medications insetting of hypotension Iron deficiency anemia - h/h stable  Ascending Aortic Aneursym  - no active issues continue semi-annual screening    Pressure wounds, POA Sacral and inferior panus wounds noted, see nursing documentation for description, wound care following  Hx of prostate ca  Gout  -no acute flare  Mild Asthma -no acute flare   DVT prophylaxis: heparin  injection 5,000  Units Start: 10/23/23 1400 Code Status:   Code Status: Full Code Family Communication: None present  Status is: Inpatient  Dispo: The patient is from: Facility              Anticipated d/c is to: Same              Anticipated d/c date is: 24-48 hours              Patient currently not medically stable for discharge  Consultants:  None  Procedures:  None  Antimicrobials:  Ceftriaxone , vancomycin   Subjective: No acute issues or events overnight, mental status slowly improving but not yet back to baseline, review of systems somewhat limited  Objective: Vitals:   10/23/23 1833 10/23/23 2113 10/23/23 2252 10/24/23 0812  BP:  106/61 126/70 124/71  Pulse:  86 83 91  Resp:  19 19 20   Temp:  98.9 F (37.2 C) 98.7 F (37.1 C) 98.6 F (37 C)  TempSrc:  Oral Oral Oral  SpO2:   99% 97%  Weight: (!) 196.5 kg       Intake/Output Summary (Last 24 hours) at 10/24/2023 1209 Last data filed at 10/24/2023 1131 Gross per 24 hour  Intake 1219.96 ml  Output 700 ml  Net 519.96 ml   Filed Weights   10/22/23 1656 10/23/23 1833  Weight: (!) 211 kg (!) 196.5 kg    Examination:  General:  Pleasantly resting in bed, No acute distress. HEENT:  Normocephalic atraumatic.  Sclerae nonicteric, noninjected.  Extraocular movements intact bilaterally. Neck:  Without mass or deformity.  Trachea is midline. Lungs:  Clear to auscultate bilaterally without rhonchi, wheeze, or rales. Heart:  Regular rate and rhythm.  Without  murmurs, rubs, or gallops. Abdomen: Soft, nontender large pannus extending inferiorly to the knees with nonblanching mild erythema and chronic skin changes. Extremities: Without cyanosis, clubbing, edema, or obvious deformity. Skin:  Warm and dry, no erythema.  Data Reviewed: I have personally reviewed following labs and imaging studies  CBC: Recent Labs  Lab 10/22/23 1625 10/23/23 0458 10/24/23 0437  WBC 14.6* 16.0* 13.2*  NEUTROABS 12.5*  --   --   HGB 9.8* 9.2* 7.7*   HCT 33.1* 31.1* 26.0*  MCV 90.9 90.4 90.6  PLT 370 364 279   Basic Metabolic Panel: Recent Labs  Lab 10/22/23 1625 10/23/23 0024 10/23/23 0458 10/23/23 0938 10/24/23 0437  NA 138 133*  --  136 136  K 5.4* 5.0  --  4.7 4.4  CL 98 98  --  98 100  CO2 29 28  --  28 27  GLUCOSE 104* 129*  --  109* 112*  BUN 30* 32*  --  32* 33*  CREATININE 1.15 1.31* 1.37* 1.29* 1.22  CALCIUM  9.8 8.9  --  9.5 8.9   GFR: Estimated Creatinine Clearance: 84.8 mL/min (by C-G formula based on SCr of 1.22 mg/dL). Liver Function Tests: Recent Labs  Lab 10/22/23 1625 10/23/23 0938  AST 38 37  ALT 30 33  ALKPHOS 94 89  BILITOT 0.7 0.6  PROT 7.7 7.3  ALBUMIN  2.4* 2.4*   Sepsis Labs: Recent Labs  Lab 10/22/23 1633 10/23/23 0024  PROCALCITON  --  0.44  LATICACIDVEN 1.5  --    Recent Results (from the past 240 hours)  Blood Culture (routine x 2)     Status: None (Preliminary result)   Collection Time: 10/22/23  4:25 PM   Specimen: BLOOD  Result Value Ref Range Status   Specimen Description BLOOD SITE NOT SPECIFIED  Final   Special Requests   Final    BOTTLES DRAWN AEROBIC AND ANAEROBIC Blood Culture results may not be optimal due to an inadequate volume of blood received in culture bottles   Culture   Final    NO GROWTH 2 DAYS Performed at [facility], [address], [place], [place] [address]    Report Status PENDING  Incomplete  Urine Culture     Status: Abnormal (Preliminary result)   Collection Time: 10/22/23  4:25 PM   Specimen: Urine, Random  Result Value Ref Range Status   Specimen Description URINE, RANDOM  Final   Special Requests NONE Reflexed from [id number]  Final   Culture (A)  Final    >=100,000 COLONIES/mL PSEUDOMONAS AERUGINOSA 40,000 COLONIES/mL PROTEUS MIRABILIS SUSCEPTIBILITIES TO FOLLOW Performed at [facility], [address] [place], [place] [address]    Report Status PENDING  Incomplete  Blood Culture (routine x 2)     Status: None (Preliminary  result)   Collection Time: 10/22/23  7:42 PM   Specimen: BLOOD RIGHT HAND  Result Value Ref Range Status   Specimen Description BLOOD RIGHT HAND  Final   Special Requests   Final    BOTTLES DRAWN AEROBIC ONLY Blood Culture results may not be optimal due to an inadequate volume of blood received in culture bottles   Culture   Final    NO GROWTH 2 DAYS Performed at [facility], [address] [place], [place] [address]    Report Status PENDING  Incomplete     Radiology Studies: ECHOCARDIOGRAM COMPLETE Result Date: 10/23/2023    ECHOCARDIOGRAM REPORT   Patient Name:  ARLEY LILLETTE MARLIN Date of Exam: 10/23/2023 Medical Rec #:  [id number]     Height:       67.0 in Accession #:    [id number]    Weight:       465.2 lb Date of Birth:  03/20/1946    BSA:          2.895 m Patient Age:    77 years      BP:           112/81 mmHg Patient Gender: M             HR:           99 bpm. Exam Location:  Inpatient Procedure: 2D Echo, Cardiac Doppler, Color Doppler and Intracardiac            Opacification Agent (Both Spectral and Color Flow Doppler were            utilized during procedure). Indications:    Cardiomegaly  History:        Patient has prior history of Echocardiogram examinations. Risk                 Factors:Hypertension.  Sonographer:    Vella Key Referring Phys: [id number] SARA-MAIZ A THOMAS  Sonographer Comments: Technically difficult study due to poor echo windows, suboptimal parasternal window, suboptimal apical window, no subcostal window and patient is obese. Image acquisition challenging due to patient body habitus. IMPRESSIONS  1. Overall poor quality study EF is normal ? mild RVE no pericardial effusion.  2. Prominent epicardial fat pad.  3. Left ventricular ejection fraction, by estimation, is 60 to 65%. The left ventricle has normal function. Left ventricular endocardial border not optimally defined to evaluate regional wall motion. There is severe left ventricular hypertrophy. Left ventricular  diastolic parameters are consistent with Grade I diastolic dysfunction (impaired relaxation).  4. Right ventricular systolic function was not well visualized. The right ventricular size is mildly enlarged.  5. Left atrial size was moderately dilated.  6. The mitral valve is normal in structure. No evidence of mitral valve regurgitation. No evidence of mitral stenosis.  7. The aortic valve was not well visualized. Aortic valve regurgitation is not visualized. No aortic stenosis is present.  8. The inferior vena cava is normal in size with greater than 50% respiratory variability, suggesting right atrial pressure of 3 mmHg. FINDINGS  Left Ventricle: Left ventricular ejection fraction, by estimation, is 60 to 65%. The left ventricle has normal function. Left ventricular endocardial border not optimally defined to evaluate regional wall motion. Definity  contrast agent was given IV to delineate the left ventricular endocardial borders. Strain was performed and the global longitudinal strain is indeterminate. The left ventricular internal cavity size was normal in size. There is severe left ventricular hypertrophy. Left ventricular diastolic parameters are consistent with Grade I diastolic dysfunction (impaired relaxation). Right Ventricle: The right ventricular size is mildly enlarged. No increase in right ventricular wall thickness. Right ventricular systolic function was not well visualized. Left Atrium: Left atrial size was moderately dilated. Right Atrium: Right atrial size was normal in size. Pericardium: There is no evidence of pericardial effusion. Mitral Valve: The mitral valve is normal in structure. No evidence of mitral valve regurgitation. No evidence of mitral valve stenosis. Tricuspid Valve: The tricuspid valve is not well visualized. Tricuspid valve regurgitation is not demonstrated. No evidence of tricuspid stenosis. Aortic Valve: The aortic valve was not well visualized. Aortic valve regurgitation is not  visualized. No aortic stenosis  is present. Pulmonic Valve: The pulmonic valve was not well visualized. Pulmonic valve regurgitation is not visualized. No evidence of pulmonic stenosis. Aorta: The aortic root is normal in size and structure. Venous: The inferior vena cava is normal in size with greater than 50% respiratory variability, suggesting right atrial pressure of 3 mmHg. IAS/Shunts: The interatrial septum was not well visualized. Additional Comments: Overall poor quality study EF is normal ? mild RVE no pericardial effusion. Prominent epicardial fat pad. 3D was performed not requiring image post processing on an independent workstation and was indeterminate.  LEFT VENTRICLE PLAX 2D LVIDd:         5.00 cm      Diastology LVIDs:         3.90 cm      LV e' medial:    11.50 cm/s LV PW:         1.80 cm      LV E/e' medial:  6.1 LV IVS:        1.80 cm      LV e' lateral:   11.30 cm/s                             LV E/e' lateral: 6.2  LV Volumes (MOD) LV vol d, MOD A2C: 89.0 ml LV vol d, MOD A4C: 131.0 ml LV vol s, MOD A2C: 20.5 ml LV vol s, MOD A4C: 38.3 ml LV SV MOD A2C:     68.5 ml LV SV MOD A4C:     131.0 ml LV SV MOD BP:      79.6 ml LEFT ATRIUM         Index LA diam:    4.50 cm 1.55 cm/m  AORTIC VALVE LVOT Vmax:   136.00 cm/s LVOT Vmean:  91.600 cm/s LVOT VTI:    0.202 m  AORTA Ao Asc diam: 3.70 cm MITRAL VALVE MV Area (PHT): 7.74 cm    SHUNTS MV Decel Time: 98 msec     Systemic VTI: 0.20 m MV E velocity: 70.20 cm/s MV A velocity: 81.20 cm/s MV E/A ratio:  0.86 Maude Emmer MD Electronically signed by Maude Emmer MD Signature Date/Time: 10/23/2023/1:40:40 PM    Final    CT ABDOMEN PELVIS W CONTRAST Result Date: 10/22/2023 CLINICAL DATA:  Concern for is sepsis with panniculitis. EXAM: CT ABDOMEN AND PELVIS WITH CONTRAST TECHNIQUE: Multidetector CT imaging of the abdomen and pelvis was performed using the standard protocol following bolus administration of intravenous contrast. RADIATION DOSE REDUCTION: This  exam was performed according to the departmental dose-optimization program which includes automated exposure control, adjustment of the mA and/or kV according to patient size and/or use of iterative reconstruction technique. CONTRAST:  OMNIPAQUE  IOHEXOL  350 MG/ML SOLN COMPARISON:  CT chest abdomen and pelvis 09/04/2023 FINDINGS: Lower chest: There is atelectasis in the right lung base. Hepatobiliary: No focal liver abnormality is seen. No gallstones, gallbladder wall thickening, or biliary dilatation. Pancreas: Unremarkable. No pancreatic ductal dilatation or surrounding inflammatory changes. Spleen: Normal in size without focal abnormality. Adrenals/Urinary Tract: The bladder is distended. There is no hydronephrosis. There are renal cortical hypodensities bilaterally which are too small to characterize, but favored as cysts. The adrenal glands are within normal limits. Stomach/Bowel: Stomach is within normal limits. Appendix is not seen. No evidence of bowel wall thickening, distention, or inflammatory changes. There is sigmoid colon diverticulosis. Vascular/Lymphatic: Aortic atherosclerosis. No enlarged abdominal or pelvic lymph nodes. Reproductive: Prostate radiotherapy seeds  present. Other: There is no ascites. There are small fat containing bilateral inguinal hernias. There are prominent bilateral inguinal lymph nodes measuring up to 11 mm on the right. There some skin thickening of the inferior right pannus was some mild subcutaneous stranding. No focal fluid collections are identified. Musculoskeletal: Degenerative changes affect the spine. IMPRESSION: 1. Skin thickening and subcutaneous stranding in the inferior right pannus worrisome for cellulitis. No focal fluid collections. 2. Distended bladder. 3. Colonic diverticulosis. 4. Prominent bilateral inguinal lymph nodes, likely reactive. Aortic Atherosclerosis (ICD10-I70.0). Electronically Signed   By: Greig Pique M.D.   On: 10/22/2023 20:56   DG  Chest Port 1 View Result Date: 10/22/2023 CLINICAL DATA:  Sepsis, hypertension EXAM: PORTABLE CHEST 1 VIEW COMPARISON:  09/07/2023 FINDINGS: The patient is rotated to the right on today's radiograph, reducing diagnostic sensitivity and specificity. Atherosclerotic calcification of the aortic arch. Mild cardiomegaly. Low lung volumes are present, causing crowding of the pulmonary vasculature. Accounting for the low lung volumes, no discrete airspace opacity is identified. No blunting of the costophrenic angles. IMPRESSION: 1. Low lung volumes, causing crowding of the pulmonary vasculature. 2. Mild cardiomegaly. 3. Aortic Atherosclerosis (ICD10-I70.0). Electronically Signed   By: Ryan Salvage M.D.   On: 10/22/2023 17:23   Scheduled Meds:  allopurinol   300 mg Oral Daily   atorvastatin   20 mg Oral QPM   collagenase    Topical Daily   Gerhardt's butt cream   Topical TID   heparin   5,000 Units Subcutaneous Q8H   Continuous Infusions:  piperacillin -tazobactam (ZOSYN )  IV 3.375 g (10/24/23 9061)   vancomycin  2,000 mg (10/23/23 2129)     LOS: 1 day   Time spent:  Elsie JAYSON Montclair, DO [facility]  If 7PM-7AM, please contact night-coverage [email or web address]  10/24/2023, 12:09 PM

## 2023-10-25 ENCOUNTER — Other Ambulatory Visit (HOSPITAL_COMMUNITY): Payer: Self-pay

## 2023-10-25 DIAGNOSIS — N3091 Cystitis, unspecified with hematuria: Secondary | ICD-10-CM | POA: Diagnosis not present

## 2023-10-25 DIAGNOSIS — Z7401 Bed confinement status: Secondary | ICD-10-CM | POA: Diagnosis not present

## 2023-10-25 DIAGNOSIS — B351 Tinea unguium: Secondary | ICD-10-CM | POA: Diagnosis not present

## 2023-10-25 DIAGNOSIS — K219 Gastro-esophageal reflux disease without esophagitis: Secondary | ICD-10-CM | POA: Diagnosis present

## 2023-10-25 DIAGNOSIS — I714 Abdominal aortic aneurysm, without rupture, unspecified: Secondary | ICD-10-CM | POA: Diagnosis not present

## 2023-10-25 DIAGNOSIS — M6281 Muscle weakness (generalized): Secondary | ICD-10-CM | POA: Diagnosis not present

## 2023-10-25 DIAGNOSIS — R0902 Hypoxemia: Secondary | ICD-10-CM | POA: Diagnosis not present

## 2023-10-25 DIAGNOSIS — M314 Aortic arch syndrome [Takayasu]: Secondary | ICD-10-CM | POA: Diagnosis not present

## 2023-10-25 DIAGNOSIS — Z6841 Body Mass Index (BMI) 40.0 and over, adult: Secondary | ICD-10-CM | POA: Diagnosis not present

## 2023-10-25 DIAGNOSIS — Z515 Encounter for palliative care: Secondary | ICD-10-CM | POA: Diagnosis not present

## 2023-10-25 DIAGNOSIS — E876 Hypokalemia: Secondary | ICD-10-CM | POA: Diagnosis not present

## 2023-10-25 DIAGNOSIS — I2510 Atherosclerotic heart disease of native coronary artery without angina pectoris: Secondary | ICD-10-CM | POA: Diagnosis present

## 2023-10-25 DIAGNOSIS — L304 Erythema intertrigo: Secondary | ICD-10-CM | POA: Diagnosis not present

## 2023-10-25 DIAGNOSIS — R310 Gross hematuria: Secondary | ICD-10-CM | POA: Diagnosis not present

## 2023-10-25 DIAGNOSIS — L89150 Pressure ulcer of sacral region, unstageable: Secondary | ICD-10-CM | POA: Diagnosis not present

## 2023-10-25 DIAGNOSIS — Z7985 Long-term (current) use of injectable non-insulin antidiabetic drugs: Secondary | ICD-10-CM | POA: Diagnosis not present

## 2023-10-25 DIAGNOSIS — N390 Urinary tract infection, site not specified: Secondary | ICD-10-CM | POA: Diagnosis not present

## 2023-10-25 DIAGNOSIS — M25572 Pain in left ankle and joints of left foot: Secondary | ICD-10-CM | POA: Diagnosis not present

## 2023-10-25 DIAGNOSIS — D649 Anemia, unspecified: Secondary | ICD-10-CM | POA: Diagnosis not present

## 2023-10-25 DIAGNOSIS — E538 Deficiency of other specified B group vitamins: Secondary | ICD-10-CM | POA: Diagnosis not present

## 2023-10-25 DIAGNOSIS — E66813 Obesity, class 3: Secondary | ICD-10-CM | POA: Diagnosis present

## 2023-10-25 DIAGNOSIS — N179 Acute kidney failure, unspecified: Secondary | ICD-10-CM | POA: Diagnosis not present

## 2023-10-25 DIAGNOSIS — A419 Sepsis, unspecified organism: Secondary | ICD-10-CM | POA: Diagnosis not present

## 2023-10-25 DIAGNOSIS — R000 Tachycardia, unspecified: Secondary | ICD-10-CM | POA: Diagnosis not present

## 2023-10-25 DIAGNOSIS — M109 Gout, unspecified: Secondary | ICD-10-CM | POA: Diagnosis present

## 2023-10-25 DIAGNOSIS — L03818 Cellulitis of other sites: Secondary | ICD-10-CM | POA: Diagnosis not present

## 2023-10-25 DIAGNOSIS — Z66 Do not resuscitate: Secondary | ICD-10-CM | POA: Diagnosis present

## 2023-10-25 DIAGNOSIS — E875 Hyperkalemia: Secondary | ICD-10-CM | POA: Diagnosis not present

## 2023-10-25 DIAGNOSIS — J9611 Chronic respiratory failure with hypoxia: Secondary | ICD-10-CM | POA: Diagnosis present

## 2023-10-25 DIAGNOSIS — Z7189 Other specified counseling: Secondary | ICD-10-CM | POA: Diagnosis not present

## 2023-10-25 DIAGNOSIS — R0602 Shortness of breath: Secondary | ICD-10-CM | POA: Diagnosis not present

## 2023-10-25 DIAGNOSIS — Z9181 History of falling: Secondary | ICD-10-CM | POA: Diagnosis not present

## 2023-10-25 DIAGNOSIS — E871 Hypo-osmolality and hyponatremia: Secondary | ICD-10-CM | POA: Diagnosis present

## 2023-10-25 DIAGNOSIS — M79672 Pain in left foot: Secondary | ICD-10-CM | POA: Diagnosis not present

## 2023-10-25 DIAGNOSIS — Z833 Family history of diabetes mellitus: Secondary | ICD-10-CM | POA: Diagnosis not present

## 2023-10-25 DIAGNOSIS — M6282 Rhabdomyolysis: Secondary | ICD-10-CM | POA: Diagnosis not present

## 2023-10-25 DIAGNOSIS — J45909 Unspecified asthma, uncomplicated: Secondary | ICD-10-CM | POA: Diagnosis not present

## 2023-10-25 DIAGNOSIS — E785 Hyperlipidemia, unspecified: Secondary | ICD-10-CM | POA: Diagnosis not present

## 2023-10-25 DIAGNOSIS — I7121 Aneurysm of the ascending aorta, without rupture: Secondary | ICD-10-CM | POA: Diagnosis present

## 2023-10-25 DIAGNOSIS — R262 Difficulty in walking, not elsewhere classified: Secondary | ICD-10-CM | POA: Diagnosis not present

## 2023-10-25 DIAGNOSIS — G4733 Obstructive sleep apnea (adult) (pediatric): Secondary | ICD-10-CM | POA: Diagnosis not present

## 2023-10-25 DIAGNOSIS — I712 Thoracic aortic aneurysm, without rupture, unspecified: Secondary | ICD-10-CM | POA: Diagnosis not present

## 2023-10-25 DIAGNOSIS — R6521 Severe sepsis with septic shock: Secondary | ICD-10-CM | POA: Diagnosis not present

## 2023-10-25 DIAGNOSIS — G9341 Metabolic encephalopathy: Secondary | ICD-10-CM | POA: Diagnosis not present

## 2023-10-25 DIAGNOSIS — L89153 Pressure ulcer of sacral region, stage 3: Secondary | ICD-10-CM | POA: Diagnosis not present

## 2023-10-25 DIAGNOSIS — E669 Obesity, unspecified: Secondary | ICD-10-CM | POA: Diagnosis not present

## 2023-10-25 DIAGNOSIS — D72829 Elevated white blood cell count, unspecified: Secondary | ICD-10-CM | POA: Diagnosis not present

## 2023-10-25 DIAGNOSIS — I10 Essential (primary) hypertension: Secondary | ICD-10-CM | POA: Diagnosis not present

## 2023-10-25 DIAGNOSIS — K760 Fatty (change of) liver, not elsewhere classified: Secondary | ICD-10-CM | POA: Diagnosis present

## 2023-10-25 DIAGNOSIS — R319 Hematuria, unspecified: Secondary | ICD-10-CM | POA: Diagnosis not present

## 2023-10-25 DIAGNOSIS — M79671 Pain in right foot: Secondary | ICD-10-CM | POA: Diagnosis not present

## 2023-10-25 DIAGNOSIS — E559 Vitamin D deficiency, unspecified: Secondary | ICD-10-CM | POA: Diagnosis not present

## 2023-10-25 DIAGNOSIS — I350 Nonrheumatic aortic (valve) stenosis: Secondary | ICD-10-CM | POA: Diagnosis present

## 2023-10-25 DIAGNOSIS — M793 Panniculitis, unspecified: Secondary | ICD-10-CM | POA: Diagnosis not present

## 2023-10-25 DIAGNOSIS — R6520 Severe sepsis without septic shock: Secondary | ICD-10-CM | POA: Diagnosis not present

## 2023-10-25 DIAGNOSIS — D62 Acute posthemorrhagic anemia: Secondary | ICD-10-CM | POA: Diagnosis present

## 2023-10-25 DIAGNOSIS — M25571 Pain in right ankle and joints of right foot: Secondary | ICD-10-CM | POA: Diagnosis not present

## 2023-10-25 DIAGNOSIS — R339 Retention of urine, unspecified: Secondary | ICD-10-CM | POA: Diagnosis not present

## 2023-10-25 DIAGNOSIS — I272 Pulmonary hypertension, unspecified: Secondary | ICD-10-CM | POA: Diagnosis present

## 2023-10-25 LAB — CBC
HCT: 26.3 % — ABNORMAL LOW (ref 39.0–52.0)
Hemoglobin: 7.8 g/dL — ABNORMAL LOW (ref 13.0–17.0)
MCH: 26.4 pg (ref 26.0–34.0)
MCHC: 29.7 g/dL — ABNORMAL LOW (ref 30.0–36.0)
MCV: 89.2 fL (ref 80.0–100.0)
Platelets: 269 K/uL (ref 150–400)
RBC: 2.95 MIL/uL — ABNORMAL LOW (ref 4.22–5.81)
RDW: 17 % — ABNORMAL HIGH (ref 11.5–15.5)
WBC: 8.4 K/uL (ref 4.0–10.5)
nRBC: 0 % (ref 0.0–0.2)

## 2023-10-25 LAB — BASIC METABOLIC PANEL WITH GFR
Anion gap: 9 (ref 5–15)
BUN: 28 mg/dL — ABNORMAL HIGH (ref 8–23)
CO2: 28 mmol/L (ref 22–32)
Calcium: 8.6 mg/dL — ABNORMAL LOW (ref 8.9–10.3)
Chloride: 98 mmol/L (ref 98–111)
Creatinine, Ser: 1.15 mg/dL (ref 0.61–1.24)
GFR, Estimated: >60 mL/min (ref >60)
Glucose, Bld: 109 mg/dL — ABNORMAL HIGH (ref 70–99)
Potassium: 3.8 mmol/L (ref 3.5–5.1)
Sodium: 135 mmol/L (ref 135–145)

## 2023-10-25 LAB — URINE CULTURE: Culture: >=100000 — AB

## 2023-10-25 LAB — GLUCOSE, CAPILLARY
Glucose-Capillary: 113 mg/dL — ABNORMAL HIGH (ref 70–99)
Glucose-Capillary: 130 mg/dL — ABNORMAL HIGH (ref 70–99)

## 2023-10-25 MED ORDER — CEFADROXIL 500 MG PO CAPS
1000.0000 mg | ORAL_CAPSULE | Freq: Two times a day (BID) | ORAL | 0 refills | Status: DC
  Filled 2023-10-25: qty 12, 3d supply, fill #0

## 2023-10-25 MED ORDER — GERHARDT'S BUTT CREAM
1.0000 | TOPICAL_CREAM | Freq: Three times a day (TID) | CUTANEOUS | 0 refills | Status: DC
  Filled 2023-10-25: qty 60, 20d supply, fill #0

## 2023-10-25 MED ORDER — CEFDINIR 300 MG PO CAPS
300.0000 mg | ORAL_CAPSULE | Freq: Two times a day (BID) | ORAL | 0 refills | Status: DC
  Filled 2023-10-25: qty 6, 3d supply, fill #0

## 2023-10-25 MED ORDER — COLLAGENASE 250 UNIT/GM EX OINT
TOPICAL_OINTMENT | Freq: Every day | CUTANEOUS | 0 refills | Status: DC
  Filled 2023-10-25: qty 30, 30d supply, fill #0

## 2023-10-25 MED ORDER — LEVOFLOXACIN 250 MG PO TABS
250.0000 mg | ORAL_TABLET | Freq: Every day | ORAL | 0 refills | Status: AC
  Filled 2023-10-25: qty 1, 1d supply, fill #0

## 2023-10-25 NOTE — TOC Transition Note (Signed)
 Transition of Care [facility]) - Discharge Note   Patient Details  Name: Curtis Hill MRN: [id number] Date of Birth: 12-May-1945  Transition of [facility]) CM/SW Contact:  [name], LCSW Phone Number: 10/25/2023, 1:34 PM   Clinical Narrative:     Patient will Discharge to: [facility]  Discharge Date: 10/25/23 Family Notified: spouse Transport By: ROME  Per MD patient is ready for discharge. RN, patient, and facility notified of discharge. Discharge Summary sent to facility. RN given number for [phone] option O room 3251. Ambulance transport requested for patient.   Clinical Social Worker signing off.  Montie Louder, MSW, LCSW Clinical Social Worker     Final next level of care: Skilled Nursing Facility Barriers to Discharge: Barriers Resolved   Patient Goals and CMS Choice            Discharge Placement              Patient chooses bed at: [facility] Patient to be transferred to facility by: [facility] Name of family member notified: spouse Patient and family notified of of transfer: 10/25/23  Discharge Plan and Services Additional resources added to the After Visit Summary for   In-house Referral: Clinical Social Work                                   Social Drivers of Health (SDOH) Interventions SDOH Screenings   Food Insecurity: No Food Insecurity (10/23/2023)  Housing: Low Risk  (10/23/2023)  Transportation Needs: Unmet Transportation Needs (10/23/2023)  Utilities: Not At Risk (10/23/2023)  Depression (PHQ2-9): Medium Risk (05/23/2020)  Social Connections: Socially Integrated (10/23/2023)  Tobacco Use: Low Risk  (09/04/2023)     Readmission Risk Interventions     No data to display

## 2023-10-25 NOTE — Discharge Summary (Addendum)
 Physician Discharge Summary  [name]:[id number] DOB: March 11, 1946 DOA: 10/22/2023  PCP: Okey Carlin Redbird, MD  Admit date: 10/22/2023 Discharge date: 10/25/2023  Admitted From: Facility Disposition: Same  Recommendations for Outpatient Follow-up:  Follow up with PCP in 1-2 weeks Follow-up with neurology as discussed  Discharge Condition: Stable CODE STATUS: Full Diet recommendation: Low-salt low-fat low-carb diet  Brief/Interim Summary: Curtis Hill is a 78 y.o. male with medical history significant of morbid obesity, hypertension, OSA on cpap, asthma, prostate cancer, gout IDA,hyperlipidemia,obesity, aortic aneurysm who has recent hospitalization   09/04/23-09/13/2023 with diagnosis of sepsis due to panniculitis /cellulitis for which he was treated with vanc and ceftriaxone  and transitioned to doxycycline  and cefadroxil .  Patient now returns form SNF with complaint of change in mental status.   Patient admitted with acute mental status changes secondary to sepsis and UTI, likely pseudomonal given cultures.  Sepsis criteria resolved patient now back to baseline, only complaint is right foot weakness which appears to be subacute if not chronic since prior hospitalization which is why he is currently at therapy.  Would not be unreasonable to have patient evaluated by neurology for definitive study to ensure no nerve impingement or other external etiology for weakness.  Otherwise stable and agreeable for discharge back to facility to complete antibiotic course with Levaquin  given discussion with Pharmacy/ID in regards to sensitivities/cultures - only requires 1 additional day of antibiotics to complete 3 day course.   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 (Fever/tachycardia/ hypotension/ leukocytosis/encephalopathy) - Discharged on levaquin     Acute metabolic encephalopathy, resolved Secondary to above -back to baseline   Acute hypoxic respiratory failure - Questionably secondary to above mental status, wean off oxygen as tolerated - Oxygen weaned off, currently tolerating room air   AKI, improving -avoid nephrotoxic agents -continue on ivfs    OSA -cpap at bedtime    HLD Morbid Obesity Class III   HTN -discontinue multiple medications as below Iron deficiency anemia - h/h stable  Ascending Aortic Aneursym  - no active issues continue semi-annual screening    Pressure wounds, POA Sacral and inferior panus wounds noted, see nursing documentation for description, wound care following   Hx of prostate ca  Gout  -no acute flare  Mild Asthma -no acute flare    Discharge Instructions  Discharge Instructions     Call MD for:  difficulty breathing, headache or visual disturbances   Complete by: As directed    Call MD for:  extreme fatigue   Complete by: As directed    Call MD for:  hives   Complete by: As directed    Call MD for:  persistant dizziness or light-headedness   Complete by: As directed    Call MD for:  persistant nausea and vomiting   Complete by: As directed    Call MD for:  severe uncontrolled pain   Complete by: As directed    Call MD for:  temperature >100.4   Complete by: As directed    Diet - low sodium heart healthy   Complete by: As directed    Discharge wound care:   Complete by: As directed    Daily - Cleanse sacral wound with Vashe Soila [phone]) do not rinse and allow to air dry.  Apply 1/4 thick layer of Santyl  to wound bed, top with saline moist gauze, dry gauze  and then secure with silicone foam or ABD pad whichever is preferred   1.  Cleanse underneath pannus/abdominal folds with Vashe wound cleanser Soila [phone]) do not rinse and allow to air dry.  Can apply a small piece of silver hydrofiber (Aquacel AG Lawson #[id number]) to any open wound beds then cover entire area with  Order Gerlean # [phone]. Measure and cut length of InterDry to fit in skin folds that have skin breakdown. Tuck InterDry fabric into skin folds in a single layer, allow for 2 inches of overhang from skin edges to allow for wicking to occur. May remove to bathe; dry area thoroughly and then tuck into affected areas again.  Do not apply any creams or ointments when using InterDry DO NOT THROW AWAY FOR 5 DAYS unless soiled with stool DO NOT WASH product, this will inactivate the silver in the material  New sheet of Interdry should be applied after 5 days of use if patient continues to have skin breakdown  2. Cleanse groin/inner thighs/buttocks  with Vashe and apply Gerhardt's Butt Cream to area 3 times a day and prn soiling   Increase activity slowly   Complete by: As directed       Allergies as of 10/25/2023       Reactions   Levofloxacin  Other (See Comments)   Aortic aneurysm   Lisinopril Anaphylaxis, Shortness Of Breath, Swelling, Rash   Prednisone  Swelling        Medication List     STOP taking these medications    carvedilol  6.25 MG tablet Commonly known as: COREG    valsartan 320 MG tablet Commonly known as: DIOVAN       TAKE these medications    albuterol  108 (90 Base) MCG/ACT inhaler Commonly known as: VENTOLIN  HFA Inhale 2 puffs into the lungs every 6 (six) hours as needed for wheezing or shortness of breath.   allopurinol  300 MG tablet Commonly known as: ZYLOPRIM  Take 300 mg by mouth daily.   ammonium lactate 12 % lotion Commonly known as: LAC-HYDRIN Apply 1 Application topically 2 (two) times daily as needed for dry skin. Apply to bilateral legs and feet two times a day for dry, irritated skin. Rub in well.   aspirin  EC 81 MG tablet Take 81 mg by mouth daily. Swallow whole.   atorvastatin  20 MG tablet Commonly known as: LIPITOR Take 20 mg by mouth every evening.   calcium  carbonate 500 MG chewable tablet Commonly known as: TUMS - dosed in mg elemental  calcium  Chew 1 tablet by mouth 2 (two) times daily. Give for calcium  supplementation   cefadroxil  500 MG capsule Commonly known as: DURICEF Take 2 capsules (1,000 mg total) by mouth 2 (two) times daily.   Centrum Silver 50+Men Tabs Take 1 tablet by mouth in the morning.   cholecalciferol 25 MCG (1000 UNIT) tablet Commonly known as: VITAMIN D3 Take 1,000 Units by mouth daily.   collagenase  250 UNIT/GM ointment Commonly known as: SANTYL  Apply topically daily.   CoQ10 200 MG Caps Take 1 capsule by mouth in the morning.   cyanocobalamin 500 MCG tablet Commonly known as: VITAMIN B12 Take 1,000 mcg by mouth daily.   furosemide  40 MG tablet Commonly known as: LASIX  Take 40 mg by mouth daily as needed for edema or fluid.   gabapentin 300 MG capsule Commonly known as: NEURONTIN Take 300 mg by mouth 3 (three) times daily. Give one capsule three times daily for nerve pain .   Gerhardt's butt cream Crea Apply  1 Application topically 3 (three) times daily.   Magnesium  200 MG Tabs Take 200 mg by mouth daily at 12 noon.   Nasal Moist Gel Place 1 Units into both nostrils daily. Give for dryness   nystatin  powder Commonly known as: MYCOSTATIN /NYSTOP  Apply topically 3 (three) times daily. What changed:  how much to take additional instructions   Semaglutide-Weight Management 0.25 MG/0.5ML Soaj Inject 0.25 mg into the skin once a week. Inject every Wednesday.   simethicone  80 MG chewable tablet Commonly known as: MYLICON Chew 160 mg by mouth 3 (three) times daily after meals. Give for GI distress   traMADol  50 MG tablet Commonly known as: ULTRAM  Take 1 tablet (50 mg total) by mouth 2 (two) times daily as needed for moderate pain (pain score 4-6) or severe pain (pain score 7-10). What changed: when to take this               Discharge Care Instructions  (From admission, onward)           Start     Ordered   10/25/23 0000  Discharge wound care:       Comments:  Daily - Cleanse sacral wound with Vashe Soila [phone]) do not rinse and allow to air dry.  Apply 1/4 thick layer of Santyl  to wound bed, top with saline moist gauze, dry gauze and then secure with silicone foam or ABD pad whichever is preferred   1.  Cleanse underneath pannus/abdominal folds with Vashe wound cleanser Soila [phone]) do not rinse and allow to air dry.  Can apply a small piece of silver hydrofiber (Aquacel AG Lawson #[id number]) to any open wound beds then cover entire area with Order Gerlean # [phone]. Measure and cut length of InterDry to fit in skin folds that have skin breakdown. Tuck InterDry fabric into skin folds in a single layer, allow for 2 inches of overhang from skin edges to allow for wicking to occur. May remove to bathe; dry area thoroughly and then tuck into affected areas again.  Do not apply any creams or ointments when using InterDry DO NOT THROW AWAY FOR 5 DAYS unless soiled with stool DO NOT WASH product, this will inactivate the silver in the material  New sheet of Interdry should be applied after 5 days of use if patient continues to have skin breakdown  2. Cleanse groin/inner thighs/buttocks  with Vashe and apply Gerhardt's Butt Cream to area 3 times a day and prn soiling   10/25/23 1040            Contact information for after-discharge care     Destination     [facility]   Service: Skilled Nursing Contact information: [address] [place]  [address] [phone]                    Allergies  Allergen Reactions   Levofloxacin  Other (See Comments)    Aortic aneurysm   Lisinopril Anaphylaxis, Shortness Of Breath, Swelling and Rash   Prednisone  Swelling    Consultations: None  Procedures/Studies: ECHOCARDIOGRAM COMPLETE Result Date: 10/23/2023    ECHOCARDIOGRAM REPORT   Patient Name:   Curtis Hill Date of Exam: 10/23/2023 Medical Rec #:  [id number]     Height:       67.0 in Accession  #:    [id number]    Weight:       465.2 lb Date of Birth:  December 09, 1945    BSA:  2.895 m Patient Age:    77 years      BP:           112/81 mmHg Patient Gender: M             HR:           99 bpm. Exam Location:  Inpatient Procedure: 2D Echo, Cardiac Doppler, Color Doppler and Intracardiac            Opacification Agent (Both Spectral and Color Flow Doppler were            utilized during procedure). Indications:    Cardiomegaly  History:        Patient has prior history of Echocardiogram examinations. Risk                 Factors:Hypertension.  Sonographer:    Vella Key Referring Phys: [id number] SARA-MAIZ A THOMAS  Sonographer Comments: Technically difficult study due to poor echo windows, suboptimal parasternal window, suboptimal apical window, no subcostal window and patient is obese. Image acquisition challenging due to patient body habitus. IMPRESSIONS  1. Overall poor quality study EF is normal ? mild RVE no pericardial effusion.  2. Prominent epicardial fat pad.  3. Left ventricular ejection fraction, by estimation, is 60 to 65%. The left ventricle has normal function. Left ventricular endocardial border not optimally defined to evaluate regional wall motion. There is severe left ventricular hypertrophy. Left ventricular diastolic parameters are consistent with Grade I diastolic dysfunction (impaired relaxation).  4. Right ventricular systolic function was not well visualized. The right ventricular size is mildly enlarged.  5. Left atrial size was moderately dilated.  6. The mitral valve is normal in structure. No evidence of mitral valve regurgitation. No evidence of mitral stenosis.  7. The aortic valve was not well visualized. Aortic valve regurgitation is not visualized. No aortic stenosis is present.  8. The inferior vena cava is normal in size with greater than 50% respiratory variability, suggesting right atrial pressure of 3 mmHg. FINDINGS  Left Ventricle: Left ventricular ejection fraction, by  estimation, is 60 to 65%. The left ventricle has normal function. Left ventricular endocardial border not optimally defined to evaluate regional wall motion. Definity  contrast agent was given IV to delineate the left ventricular endocardial borders. Strain was performed and the global longitudinal strain is indeterminate. The left ventricular internal cavity size was normal in size. There is severe left ventricular hypertrophy. Left ventricular diastolic parameters are consistent with Grade I diastolic dysfunction (impaired relaxation). Right Ventricle: The right ventricular size is mildly enlarged. No increase in right ventricular wall thickness. Right ventricular systolic function was not well visualized. Left Atrium: Left atrial size was moderately dilated. Right Atrium: Right atrial size was normal in size. Pericardium: There is no evidence of pericardial effusion. Mitral Valve: The mitral valve is normal in structure. No evidence of mitral valve regurgitation. No evidence of mitral valve stenosis. Tricuspid Valve: The tricuspid valve is not well visualized. Tricuspid valve regurgitation is not demonstrated. No evidence of tricuspid stenosis. Aortic Valve: The aortic valve was not well visualized. Aortic valve regurgitation is not visualized. No aortic stenosis is present. Pulmonic Valve: The pulmonic valve was not well visualized. Pulmonic valve regurgitation is not visualized. No evidence of pulmonic stenosis. Aorta: The aortic root is normal in size and structure. Venous: The inferior vena cava is normal in size with greater than 50% respiratory variability, suggesting right atrial pressure of 3 mmHg. IAS/Shunts: The interatrial septum was not well visualized.  Additional Comments: Overall poor quality study EF is normal ? mild RVE no pericardial effusion. Prominent epicardial fat pad. 3D was performed not requiring image post processing on an independent workstation and was indeterminate.  LEFT VENTRICLE PLAX  2D LVIDd:         5.00 cm      Diastology LVIDs:         3.90 cm      LV e' medial:    11.50 cm/s LV PW:         1.80 cm      LV E/e' medial:  6.1 LV IVS:        1.80 cm      LV e' lateral:   11.30 cm/s                             LV E/e' lateral: 6.2  LV Volumes (MOD) LV vol d, MOD A2C: 89.0 ml LV vol d, MOD A4C: 131.0 ml LV vol s, MOD A2C: 20.5 ml LV vol s, MOD A4C: 38.3 ml LV SV MOD A2C:     68.5 ml LV SV MOD A4C:     131.0 ml LV SV MOD BP:      79.6 ml LEFT ATRIUM         Index LA diam:    4.50 cm 1.55 cm/m  AORTIC VALVE LVOT Vmax:   136.00 cm/s LVOT Vmean:  91.600 cm/s LVOT VTI:    0.202 m  AORTA Ao Asc diam: 3.70 cm MITRAL VALVE MV Area (PHT): 7.74 cm    SHUNTS MV Decel Time: 98 msec     Systemic VTI: 0.20 m MV E velocity: 70.20 cm/s MV A velocity: 81.20 cm/s MV E/A ratio:  0.86 Maude Emmer MD Electronically signed by Maude Emmer MD Signature Date/Time: 10/23/2023/1:40:40 PM    Final    CT ABDOMEN PELVIS W CONTRAST Result Date: 10/22/2023 CLINICAL DATA:  Concern for is sepsis with panniculitis. EXAM: CT ABDOMEN AND PELVIS WITH CONTRAST TECHNIQUE: Multidetector CT imaging of the abdomen and pelvis was performed using the standard protocol following bolus administration of intravenous contrast. RADIATION DOSE REDUCTION: This exam was performed according to the departmental dose-optimization program which includes automated exposure control, adjustment of the mA and/or kV according to patient size and/or use of iterative reconstruction technique. CONTRAST:  OMNIPAQUE  IOHEXOL  350 MG/ML SOLN COMPARISON:  CT chest abdomen and pelvis 09/04/2023 FINDINGS: Lower chest: There is atelectasis in the right lung base. Hepatobiliary: No focal liver abnormality is seen. No gallstones, gallbladder wall thickening, or biliary dilatation. Pancreas: Unremarkable. No pancreatic ductal dilatation or surrounding inflammatory changes. Spleen: Normal in size without focal abnormality. Adrenals/Urinary Tract: The bladder is  distended. There is no hydronephrosis. There are renal cortical hypodensities bilaterally which are too small to characterize, but favored as cysts. The adrenal glands are within normal limits. Stomach/Bowel: Stomach is within normal limits. Appendix is not seen. No evidence of bowel wall thickening, distention, or inflammatory changes. There is sigmoid colon diverticulosis. Vascular/Lymphatic: Aortic atherosclerosis. No enlarged abdominal or pelvic lymph nodes. Reproductive: Prostate radiotherapy seeds present. Other: There is no ascites. There are small fat containing bilateral inguinal hernias. There are prominent bilateral inguinal lymph nodes measuring up to 11 mm on the right. There some skin thickening of the inferior right pannus was some mild subcutaneous stranding. No focal fluid collections are identified. Musculoskeletal: Degenerative changes affect the spine. IMPRESSION: 1. Skin thickening and subcutaneous  stranding in the inferior right pannus worrisome for cellulitis. No focal fluid collections. 2. Distended bladder. 3. Colonic diverticulosis. 4. Prominent bilateral inguinal lymph nodes, likely reactive. Aortic Atherosclerosis (ICD10-I70.0). Electronically Signed   By: Greig Pique M.D.   On: 10/22/2023 20:56   DG Chest Port 1 View Result Date: 10/22/2023 CLINICAL DATA:  Sepsis, hypertension EXAM: PORTABLE CHEST 1 VIEW COMPARISON:  09/07/2023 FINDINGS: The patient is rotated to the right on today's radiograph, reducing diagnostic sensitivity and specificity. Atherosclerotic calcification of the aortic arch. Mild cardiomegaly. Low lung volumes are present, causing crowding of the pulmonary vasculature. Accounting for the low lung volumes, no discrete airspace opacity is identified. No blunting of the costophrenic angles. IMPRESSION: 1. Low lung volumes, causing crowding of the pulmonary vasculature. 2. Mild cardiomegaly. 3. Aortic Atherosclerosis (ICD10-I70.0). Electronically Signed   By: Ryan Salvage M.D.   On: 10/22/2023 17:23     Subjective: No acute issues or events overnight   Discharge Exam: Vitals:   10/25/23 0739 10/25/23 1221  BP: 116/67 124/64  Pulse: 86 88  Resp: 14 (!) 23  Temp: 98.1 F (36.7 C) 97.9 F (36.6 C)  SpO2: 98% 93%   Vitals:   10/25/23 0045 10/25/23 0050 10/25/23 0739 10/25/23 1221  BP:   116/67 124/64  Pulse: 89 91 86 88  Resp: (!) 21 17 14  (!) 23  Temp:   98.1 F (36.7 C) 97.9 F (36.6 C)  TempSrc:   Oral Oral  SpO2: 94% 97% 98% 93%  Weight:        General:  Pleasantly resting in bed, No acute distress. HEENT:  Normocephalic atraumatic.  Sclerae nonicteric, noninjected.  Extraocular movements intact bilaterally. Neck:  Without mass or deformity.  Trachea is midline. Lungs:  Clear to auscultate bilaterally without rhonchi, wheeze, or rales. Heart:  Regular rate and rhythm.  Without murmurs, rubs, or gallops. Abdomen: Soft, nontender large pannus extending inferiorly to the knees with nonblanching mild erythema and chronic skin changes. Extremities: Without cyanosis, clubbing, edema, or obvious deformity.    The results of significant diagnostics from this hospitalization (including imaging, microbiology, ancillary and laboratory) are listed below for reference.     Microbiology: Recent Results (from the past 240 hours)  Blood Culture (routine x 2)     Status: None (Preliminary result)   Collection Time: 10/22/23  4:25 PM   Specimen: BLOOD  Result Value Ref Range Status   Specimen Description BLOOD SITE NOT SPECIFIED  Final   Special Requests   Final    BOTTLES DRAWN AEROBIC AND ANAEROBIC Blood Culture results may not be optimal due to an inadequate volume of blood received in culture bottles   Culture   Final    NO GROWTH 3 DAYS Performed at [facility], [address], [place], [place] [address]    Report Status PENDING  Incomplete  Urine Culture     Status: Abnormal   Collection Time: 10/22/23  4:25 PM    Specimen: Urine, Random  Result Value Ref Range Status   Specimen Description URINE, RANDOM  Final   Special Requests   Final    NONE Reflexed from [id number] Performed at [facility], [address] [place], [place] [address]    Culture (A)  Final    >=100,000 COLONIES/mL PSEUDOMONAS AERUGINOSA 40,000 COLONIES/mL PROTEUS MIRABILIS    Report Status 10/25/2023 FINAL  Final   Organism ID, Bacteria PSEUDOMONAS AERUGINOSA (A)  Final   Organism ID, Bacteria PROTEUS MIRABILIS (  A)  Final      Susceptibility   Pseudomonas aeruginosa - MIC*    CEFTAZIDIME <=1 SENSITIVE Sensitive     CIPROFLOXACIN 1 INTERMEDIATE Intermediate     GENTAMICIN <=1 SENSITIVE Sensitive     IMIPENEM 1 SENSITIVE Sensitive     PIP/TAZO <=4 SENSITIVE Sensitive ug/mL    * >=100,000 COLONIES/mL PSEUDOMONAS AERUGINOSA   Proteus mirabilis - MIC*    AMPICILLIN >=32 RESISTANT Resistant     CEFAZOLIN 8 SENSITIVE Sensitive     CEFEPIME  <=0.12 SENSITIVE Sensitive     CEFTRIAXONE  <=0.25 SENSITIVE Sensitive     CIPROFLOXACIN >=4 RESISTANT Resistant     GENTAMICIN <=1 SENSITIVE Sensitive     IMIPENEM 4 SENSITIVE Sensitive     NITROFURANTOIN 128 RESISTANT Resistant     TRIMETH/SULFA >=320 RESISTANT Resistant     AMPICILLIN/SULBACTAM <=2 SENSITIVE Sensitive     PIP/TAZO <=4 SENSITIVE Sensitive ug/mL    * 40,000 COLONIES/mL PROTEUS MIRABILIS  Blood Culture (routine x 2)     Status: None (Preliminary result)   Collection Time: 10/22/23  7:42 PM   Specimen: BLOOD RIGHT HAND  Result Value Ref Range Status   Specimen Description BLOOD RIGHT HAND  Final   Special Requests   Final    BOTTLES DRAWN AEROBIC ONLY Blood Culture results may not be optimal due to an inadequate volume of blood received in culture bottles   Culture   Final    NO GROWTH 3 DAYS Performed at [facility], [address] [place], [place] [address]    Report Status PENDING  Incomplete     Labs: BNP (last 3 results) Recent Labs     10/23/23 0938  BNP 234.3*   Basic Metabolic Panel: Recent Labs  Lab 10/22/23 1625 10/23/23 0024 10/23/23 0458 10/23/23 0938 10/24/23 0437 10/25/23 0358  NA 138 133*  --  136 136 135  K 5.4* 5.0  --  4.7 4.4 3.8  CL 98 98  --  98 100 98  CO2 29 28  --  28 27 28   GLUCOSE 104* 129*  --  109* 112* 109*  BUN 30* 32*  --  32* 33* 28*  CREATININE 1.15 1.31* 1.37* 1.29* 1.22 1.15  CALCIUM  9.8 8.9  --  9.5 8.9 8.6*   Liver Function Tests: Recent Labs  Lab 10/22/23 1625 10/23/23 0938  AST 38 37  ALT 30 33  ALKPHOS 94 89  BILITOT 0.7 0.6  PROT 7.7 7.3  ALBUMIN  2.4* 2.4*   No results for input(s): LIPASE, AMYLASE in the last 168 hours. No results for input(s): AMMONIA in the last 168 hours. CBC: Recent Labs  Lab 10/22/23 1625 10/23/23 0458 10/24/23 0437 10/25/23 0358  WBC 14.6* 16.0* 13.2* 8.4  NEUTROABS 12.5*  --   --   --   HGB 9.8* 9.2* 7.7* 7.8*  HCT 33.1* 31.1* 26.0* 26.3*  MCV 90.9 90.4 90.6 89.2  PLT 370 364 279 269   Cardiac Enzymes: No results for input(s): CKTOTAL, CKMB, CKMBINDEX, TROPONINI in the last 168 hours. BNP: Invalid input(s): POCBNP CBG: Recent Labs  Lab 10/24/23 1128 10/24/23 1615 10/24/23 2101 10/25/23 0535 10/25/23 1224  GLUCAP 126* 99 112* 113* 130*   D-Dimer No results for input(s): DDIMER in the last 72 hours. Hgb A1c No results for input(s): HGBA1C in the last 72 hours. Lipid Profile No results for input(s): CHOL, HDL, LDLCALC, TRIG, CHOLHDL, LDLDIRECT in the last 72 hours. Thyroid  function studies No results for input(s):  TSH, T4TOTAL, T3FREE, THYROIDAB in the last 72 hours.  Invalid input(s): FREET3 Anemia work up No results for input(s): VITAMINB12, FOLATE, FERRITIN, TIBC, IRON, RETICCTPCT in the last 72 hours. Urinalysis    Component Value Date/Time   COLORURINE YELLOW 10/22/2023 1625   APPEARANCEUR CLOUDY (A) 10/22/2023 1625   LABSPEC 1.009 10/22/2023 1625    PHURINE 7.0 10/22/2023 1625   GLUCOSEU NEGATIVE 10/22/2023 1625   HGBUR LARGE (A) 10/22/2023 1625   BILIRUBINUR NEGATIVE 10/22/2023 1625   KETONESUR NEGATIVE 10/22/2023 1625   PROTEINUR 100 (A) 10/22/2023 1625   UROBILINOGEN 1.0 02/18/2007 1617   NITRITE POSITIVE (A) 10/22/2023 1625   LEUKOCYTESUR MODERATE (A) 10/22/2023 1625   Sepsis Labs Recent Labs  Lab 10/22/23 1625 10/23/23 0458 10/24/23 0437 10/25/23 0358  WBC 14.6* 16.0* 13.2* 8.4   Microbiology Recent Results (from the past 240 hours)  Blood Culture (routine x 2)     Status: None (Preliminary result)   Collection Time: 10/22/23  4:25 PM   Specimen: BLOOD  Result Value Ref Range Status   Specimen Description BLOOD SITE NOT SPECIFIED  Final   Special Requests   Final    BOTTLES DRAWN AEROBIC AND ANAEROBIC Blood Culture results may not be optimal due to an inadequate volume of blood received in culture bottles   Culture   Final    NO GROWTH 3 DAYS Performed at [facility] Lab, [address], [place], [place] [address]    Report Status PENDING  Incomplete  Urine Culture     Status: Abnormal   Collection Time: 10/22/23  4:25 PM   Specimen: Urine, Random  Result Value Ref Range Status   Specimen Description URINE, RANDOM  Final   Special Requests   Final    NONE Reflexed from [id number] Performed at [facility] Lab, [address] [place], [place] [address]    Culture (A)  Final    >=100,000 COLONIES/mL PSEUDOMONAS AERUGINOSA 40,000 COLONIES/mL PROTEUS MIRABILIS    Report Status 10/25/2023 FINAL  Final   Organism ID, Bacteria PSEUDOMONAS AERUGINOSA (A)  Final   Organism ID, Bacteria PROTEUS MIRABILIS (A)  Final      Susceptibility   Pseudomonas aeruginosa - MIC*    CEFTAZIDIME <=1 SENSITIVE Sensitive     CIPROFLOXACIN 1 INTERMEDIATE Intermediate     GENTAMICIN <=1 SENSITIVE Sensitive     IMIPENEM 1 SENSITIVE Sensitive     PIP/TAZO <=4 SENSITIVE Sensitive ug/mL    * >=100,000 COLONIES/mL PSEUDOMONAS  AERUGINOSA   Proteus mirabilis - MIC*    AMPICILLIN >=32 RESISTANT Resistant     CEFAZOLIN 8 SENSITIVE Sensitive     CEFEPIME  <=0.12 SENSITIVE Sensitive     CEFTRIAXONE  <=0.25 SENSITIVE Sensitive     CIPROFLOXACIN >=4 RESISTANT Resistant     GENTAMICIN <=1 SENSITIVE Sensitive     IMIPENEM 4 SENSITIVE Sensitive     NITROFURANTOIN 128 RESISTANT Resistant     TRIMETH/SULFA >=320 RESISTANT Resistant     AMPICILLIN/SULBACTAM <=2 SENSITIVE Sensitive     PIP/TAZO <=4 SENSITIVE Sensitive ug/mL    * 40,000 COLONIES/mL PROTEUS MIRABILIS  Blood Culture (routine x 2)     Status: None (Preliminary result)   Collection Time: 10/22/23  7:42 PM   Specimen: BLOOD RIGHT HAND  Result Value Ref Range Status   Specimen Description BLOOD RIGHT HAND  Final   Special Requests   Final    BOTTLES DRAWN AEROBIC ONLY Blood Culture results may not be optimal due to an inadequate volume of  blood received in culture bottles   Culture   Final    NO GROWTH 3 DAYS Performed at [facility] Lab, [address] [place], [place] [address]    Report Status PENDING  Incomplete     Time coordinating discharge: Over 30 minutes  SIGNED:   Elsie JAYSON Montclair, DO [facility] 10/25/2023, 1:27 PM Pager   If 7PM-7AM, please contact night-coverage [email or web address]

## 2023-10-25 NOTE — Progress Notes (Signed)
 Attempted to call report x 3 to [facility]. Will inform oncoming RN.

## 2023-10-25 NOTE — TOC Initial Note (Signed)
 Transition of Care [facility]) - Initial/Assessment Note    Patient Details  Name: Curtis Hill MRN: [id number] Date of Birth: Aug 04, 1945  [facility]) CM/SW Contact:    [name], LCSW Phone Number: 10/25/2023, 12:00 PM  Clinical Narrative:                  CSW spoke with patient's son - he confirmed the patient is from [facility]. CSW explained attempt to to reach spouse but received no answer. He suggested to call her home number.   CSW spoke with patient's spouse- she states the patient is from [facility] and is expected to return today. She requested CSW giver hear call back once the patient is discharged.   [facility] confirmed patient can return today.   TOC will continue to follow and assist with discharge planning.   Montie Louder, MSW, LCSW Clinical Social Worker    Expected Discharge Plan: Skilled Nursing Facility Barriers to Discharge: Barriers Resolved   Patient Goals and CMS Choice            Expected Discharge Plan and Services In-house Referral: Clinical Social Work       Expected Discharge Date: 10/25/23                                    Prior Living Arrangements/Services   Lives with:: Self                   Activities of Daily Living      Permission Sought/Granted                  Emotional Assessment           Psych Involvement: No (comment)  Admission diagnosis:  Panniculitis [M79.3] Sepsis secondary to UTI (HCC) [A41.9, N39.0] Urinary tract infection with hematuria, site unspecified [N39.0, R31.9] Sepsis, due to unspecified organism, unspecified whether acute organ dysfunction present [facility]) [A41.9] Patient Active Problem List   Diagnosis Date Noted   Sepsis secondary to UTI (HCC) 10/23/2023   SIRS (systemic inflammatory response syndrome) (HCC) 09/04/2023   AKI (acute kidney injury) (HCC) 09/04/2023   OSA (obstructive sleep apnea) 09/04/2023   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   Peripheral edema 12/07/2020   B12 deficiency 11/14/2020   Essential hypertension 08/15/2020   Visceral obesity 07/29/2020   Prediabetes 06/20/2020   Vitamin D deficiency 05/23/2020   Hyperglycemia 05/23/2020   Shortness of breath on exertion 05/23/2020   Debility 05/23/2020   Nutrition impaired due to imbalance of nutrients 05/23/2020   Depression screening 05/23/2020   Thoracic ascending aortic aneurysm (HCC) 11/22/2019   Panniculitis 10/31/2019   History of colonic polyps 02/27/2019   Pulmonary HTN (HCC) 11/02/2018   CAD (coronary artery disease) 10/20/2018   Aortic stenosis 07/19/2018   Hyperlipidemia LDL goal <70 07/19/2018   Hyperlipidemia    Coronary artery calcification seen on CAT scan 03/17/2018   Benign essential HTN 03/17/2018   Thoracic aortic aneurysm (TAA) (HCC) 03/17/2018   Benign neoplasm of colon 03/28/2012   BMI 70 and over, adult (HCC) 05/18/2007   ANEMIA, IRON DEFICIENCY 05/18/2007   Leukocytosis 05/18/2007   Disease of blood and blood forming organ 05/18/2007   CARDIOMEGALY 05/18/2007   SINUSITIS, ACUTE 05/18/2007   Diaphragmatic hernia 05/18/2007   Abdominal pain 05/18/2007   EPIGASTRIC PAIN 05/18/2007   GOUT, HX OF 05/18/2007   PCP:  Okey Carlin Redbird, MD Pharmacy:   [facility] [id number] - [address], [place] - [address] [place] [address] Phone: [phone] Fax: [phone]  [facility] [address] [place] [address] Phone: [phone] Fax: [phone]     Social Drivers of Health (SDOH) Social History: SDOH Screenings   Food Insecurity: No Food Insecurity (10/23/2023)  Housing: Low Risk  (10/23/2023)  Transportation Needs: Unmet Transportation Needs (10/23/2023)  Utilities: Not At Risk (10/23/2023)  Depression (PHQ2-9): Medium Risk (05/23/2020)  Social Connections: Socially Integrated (10/23/2023)  Tobacco Use: Low Risk   (09/04/2023)   SDOH Interventions:     Readmission Risk Interventions     No data to display

## 2023-10-26 DIAGNOSIS — M793 Panniculitis, unspecified: Secondary | ICD-10-CM | POA: Diagnosis not present

## 2023-10-26 DIAGNOSIS — N179 Acute kidney failure, unspecified: Secondary | ICD-10-CM | POA: Diagnosis not present

## 2023-10-26 DIAGNOSIS — I714 Abdominal aortic aneurysm, without rupture, unspecified: Secondary | ICD-10-CM | POA: Diagnosis not present

## 2023-10-26 DIAGNOSIS — M6282 Rhabdomyolysis: Secondary | ICD-10-CM | POA: Diagnosis not present

## 2023-10-26 DIAGNOSIS — Z6841 Body Mass Index (BMI) 40.0 and over, adult: Secondary | ICD-10-CM | POA: Diagnosis not present

## 2023-10-26 DIAGNOSIS — A419 Sepsis, unspecified organism: Secondary | ICD-10-CM | POA: Diagnosis not present

## 2023-10-26 DIAGNOSIS — G4733 Obstructive sleep apnea (adult) (pediatric): Secondary | ICD-10-CM | POA: Diagnosis not present

## 2023-10-26 DIAGNOSIS — B351 Tinea unguium: Secondary | ICD-10-CM | POA: Diagnosis not present

## 2023-10-26 DIAGNOSIS — E785 Hyperlipidemia, unspecified: Secondary | ICD-10-CM | POA: Diagnosis not present

## 2023-10-26 DIAGNOSIS — I10 Essential (primary) hypertension: Secondary | ICD-10-CM | POA: Diagnosis not present

## 2023-10-26 DIAGNOSIS — Z9181 History of falling: Secondary | ICD-10-CM | POA: Diagnosis not present

## 2023-10-27 DIAGNOSIS — M793 Panniculitis, unspecified: Secondary | ICD-10-CM | POA: Diagnosis not present

## 2023-10-27 DIAGNOSIS — E785 Hyperlipidemia, unspecified: Secondary | ICD-10-CM | POA: Diagnosis not present

## 2023-10-27 DIAGNOSIS — I10 Essential (primary) hypertension: Secondary | ICD-10-CM | POA: Diagnosis not present

## 2023-10-27 DIAGNOSIS — Z9181 History of falling: Secondary | ICD-10-CM | POA: Diagnosis not present

## 2023-10-27 DIAGNOSIS — A419 Sepsis, unspecified organism: Secondary | ICD-10-CM | POA: Diagnosis not present

## 2023-10-27 DIAGNOSIS — M6282 Rhabdomyolysis: Secondary | ICD-10-CM | POA: Diagnosis not present

## 2023-10-27 DIAGNOSIS — L304 Erythema intertrigo: Secondary | ICD-10-CM | POA: Diagnosis not present

## 2023-10-27 DIAGNOSIS — Z6841 Body Mass Index (BMI) 40.0 and over, adult: Secondary | ICD-10-CM | POA: Diagnosis not present

## 2023-10-27 DIAGNOSIS — G4733 Obstructive sleep apnea (adult) (pediatric): Secondary | ICD-10-CM | POA: Diagnosis not present

## 2023-10-27 DIAGNOSIS — I714 Abdominal aortic aneurysm, without rupture, unspecified: Secondary | ICD-10-CM | POA: Diagnosis not present

## 2023-10-27 DIAGNOSIS — N179 Acute kidney failure, unspecified: Secondary | ICD-10-CM | POA: Diagnosis not present

## 2023-10-27 DIAGNOSIS — B351 Tinea unguium: Secondary | ICD-10-CM | POA: Diagnosis not present

## 2023-10-27 DIAGNOSIS — L89150 Pressure ulcer of sacral region, unstageable: Secondary | ICD-10-CM | POA: Diagnosis not present

## 2023-10-27 LAB — CULTURE, BLOOD (ROUTINE X 2)
Culture: NO GROWTH
Culture: NO GROWTH

## 2023-10-29 DIAGNOSIS — M314 Aortic arch syndrome [Takayasu]: Secondary | ICD-10-CM | POA: Diagnosis not present

## 2023-10-29 DIAGNOSIS — L03818 Cellulitis of other sites: Secondary | ICD-10-CM | POA: Diagnosis not present

## 2023-10-29 DIAGNOSIS — M6282 Rhabdomyolysis: Secondary | ICD-10-CM | POA: Diagnosis not present

## 2023-10-29 DIAGNOSIS — A419 Sepsis, unspecified organism: Secondary | ICD-10-CM | POA: Diagnosis not present

## 2023-10-29 DIAGNOSIS — M793 Panniculitis, unspecified: Secondary | ICD-10-CM | POA: Diagnosis not present

## 2023-10-29 DIAGNOSIS — M6281 Muscle weakness (generalized): Secondary | ICD-10-CM | POA: Diagnosis not present

## 2023-10-29 DIAGNOSIS — R262 Difficulty in walking, not elsewhere classified: Secondary | ICD-10-CM | POA: Diagnosis not present

## 2023-10-29 DIAGNOSIS — N179 Acute kidney failure, unspecified: Secondary | ICD-10-CM | POA: Diagnosis not present

## 2023-10-29 DIAGNOSIS — Z6841 Body Mass Index (BMI) 40.0 and over, adult: Secondary | ICD-10-CM | POA: Diagnosis not present

## 2023-10-29 DIAGNOSIS — N390 Urinary tract infection, site not specified: Secondary | ICD-10-CM | POA: Diagnosis not present

## 2023-10-29 DIAGNOSIS — G9341 Metabolic encephalopathy: Secondary | ICD-10-CM | POA: Diagnosis not present

## 2023-11-01 DIAGNOSIS — M6281 Muscle weakness (generalized): Secondary | ICD-10-CM | POA: Diagnosis not present

## 2023-11-01 DIAGNOSIS — N179 Acute kidney failure, unspecified: Secondary | ICD-10-CM | POA: Diagnosis not present

## 2023-11-01 DIAGNOSIS — N390 Urinary tract infection, site not specified: Secondary | ICD-10-CM | POA: Diagnosis not present

## 2023-11-01 DIAGNOSIS — M314 Aortic arch syndrome [Takayasu]: Secondary | ICD-10-CM | POA: Diagnosis not present

## 2023-11-01 DIAGNOSIS — L03818 Cellulitis of other sites: Secondary | ICD-10-CM | POA: Diagnosis not present

## 2023-11-01 DIAGNOSIS — A419 Sepsis, unspecified organism: Secondary | ICD-10-CM | POA: Diagnosis not present

## 2023-11-01 DIAGNOSIS — R262 Difficulty in walking, not elsewhere classified: Secondary | ICD-10-CM | POA: Diagnosis not present

## 2023-11-01 DIAGNOSIS — M793 Panniculitis, unspecified: Secondary | ICD-10-CM | POA: Diagnosis not present

## 2023-11-01 DIAGNOSIS — Z6841 Body Mass Index (BMI) 40.0 and over, adult: Secondary | ICD-10-CM | POA: Diagnosis not present

## 2023-11-01 DIAGNOSIS — G9341 Metabolic encephalopathy: Secondary | ICD-10-CM | POA: Diagnosis not present

## 2023-11-01 DIAGNOSIS — M6282 Rhabdomyolysis: Secondary | ICD-10-CM | POA: Diagnosis not present

## 2023-11-02 DIAGNOSIS — M793 Panniculitis, unspecified: Secondary | ICD-10-CM | POA: Diagnosis not present

## 2023-11-02 DIAGNOSIS — N390 Urinary tract infection, site not specified: Secondary | ICD-10-CM | POA: Diagnosis not present

## 2023-11-02 DIAGNOSIS — A419 Sepsis, unspecified organism: Secondary | ICD-10-CM | POA: Diagnosis not present

## 2023-11-02 DIAGNOSIS — G9341 Metabolic encephalopathy: Secondary | ICD-10-CM | POA: Diagnosis not present

## 2023-11-02 DIAGNOSIS — M6282 Rhabdomyolysis: Secondary | ICD-10-CM | POA: Diagnosis not present

## 2023-11-02 DIAGNOSIS — M314 Aortic arch syndrome [Takayasu]: Secondary | ICD-10-CM | POA: Diagnosis not present

## 2023-11-02 DIAGNOSIS — M6281 Muscle weakness (generalized): Secondary | ICD-10-CM | POA: Diagnosis not present

## 2023-11-02 DIAGNOSIS — L03818 Cellulitis of other sites: Secondary | ICD-10-CM | POA: Diagnosis not present

## 2023-11-02 DIAGNOSIS — R262 Difficulty in walking, not elsewhere classified: Secondary | ICD-10-CM | POA: Diagnosis not present

## 2023-11-02 DIAGNOSIS — Z6841 Body Mass Index (BMI) 40.0 and over, adult: Secondary | ICD-10-CM | POA: Diagnosis not present

## 2023-11-02 DIAGNOSIS — N179 Acute kidney failure, unspecified: Secondary | ICD-10-CM | POA: Diagnosis not present

## 2023-11-03 DIAGNOSIS — R262 Difficulty in walking, not elsewhere classified: Secondary | ICD-10-CM | POA: Diagnosis not present

## 2023-11-03 DIAGNOSIS — A419 Sepsis, unspecified organism: Secondary | ICD-10-CM | POA: Diagnosis not present

## 2023-11-03 DIAGNOSIS — M6281 Muscle weakness (generalized): Secondary | ICD-10-CM | POA: Diagnosis not present

## 2023-11-03 DIAGNOSIS — L03818 Cellulitis of other sites: Secondary | ICD-10-CM | POA: Diagnosis not present

## 2023-11-03 DIAGNOSIS — M6282 Rhabdomyolysis: Secondary | ICD-10-CM | POA: Diagnosis not present

## 2023-11-03 DIAGNOSIS — M314 Aortic arch syndrome [Takayasu]: Secondary | ICD-10-CM | POA: Diagnosis not present

## 2023-11-03 DIAGNOSIS — N179 Acute kidney failure, unspecified: Secondary | ICD-10-CM | POA: Diagnosis not present

## 2023-11-03 DIAGNOSIS — M793 Panniculitis, unspecified: Secondary | ICD-10-CM | POA: Diagnosis not present

## 2023-11-03 DIAGNOSIS — Z6841 Body Mass Index (BMI) 40.0 and over, adult: Secondary | ICD-10-CM | POA: Diagnosis not present

## 2023-11-03 DIAGNOSIS — N390 Urinary tract infection, site not specified: Secondary | ICD-10-CM | POA: Diagnosis not present

## 2023-11-03 DIAGNOSIS — G9341 Metabolic encephalopathy: Secondary | ICD-10-CM | POA: Diagnosis not present

## 2023-11-05 DIAGNOSIS — N179 Acute kidney failure, unspecified: Secondary | ICD-10-CM | POA: Diagnosis not present

## 2023-11-05 DIAGNOSIS — R262 Difficulty in walking, not elsewhere classified: Secondary | ICD-10-CM | POA: Diagnosis not present

## 2023-11-05 DIAGNOSIS — A419 Sepsis, unspecified organism: Secondary | ICD-10-CM | POA: Diagnosis not present

## 2023-11-05 DIAGNOSIS — M6282 Rhabdomyolysis: Secondary | ICD-10-CM | POA: Diagnosis not present

## 2023-11-05 DIAGNOSIS — M793 Panniculitis, unspecified: Secondary | ICD-10-CM | POA: Diagnosis not present

## 2023-11-05 DIAGNOSIS — Z6841 Body Mass Index (BMI) 40.0 and over, adult: Secondary | ICD-10-CM | POA: Diagnosis not present

## 2023-11-05 DIAGNOSIS — M6281 Muscle weakness (generalized): Secondary | ICD-10-CM | POA: Diagnosis not present

## 2023-11-05 DIAGNOSIS — G9341 Metabolic encephalopathy: Secondary | ICD-10-CM | POA: Diagnosis not present

## 2023-11-05 DIAGNOSIS — L03818 Cellulitis of other sites: Secondary | ICD-10-CM | POA: Diagnosis not present

## 2023-11-05 DIAGNOSIS — N390 Urinary tract infection, site not specified: Secondary | ICD-10-CM | POA: Diagnosis not present

## 2023-11-05 DIAGNOSIS — M314 Aortic arch syndrome [Takayasu]: Secondary | ICD-10-CM | POA: Diagnosis not present

## 2023-11-08 DIAGNOSIS — Z6841 Body Mass Index (BMI) 40.0 and over, adult: Secondary | ICD-10-CM | POA: Diagnosis not present

## 2023-11-08 DIAGNOSIS — A419 Sepsis, unspecified organism: Secondary | ICD-10-CM | POA: Diagnosis not present

## 2023-11-08 DIAGNOSIS — N390 Urinary tract infection, site not specified: Secondary | ICD-10-CM | POA: Diagnosis not present

## 2023-11-08 DIAGNOSIS — G9341 Metabolic encephalopathy: Secondary | ICD-10-CM | POA: Diagnosis not present

## 2023-11-08 DIAGNOSIS — L03818 Cellulitis of other sites: Secondary | ICD-10-CM | POA: Diagnosis not present

## 2023-11-08 DIAGNOSIS — N179 Acute kidney failure, unspecified: Secondary | ICD-10-CM | POA: Diagnosis not present

## 2023-11-08 DIAGNOSIS — M6282 Rhabdomyolysis: Secondary | ICD-10-CM | POA: Diagnosis not present

## 2023-11-08 DIAGNOSIS — R262 Difficulty in walking, not elsewhere classified: Secondary | ICD-10-CM | POA: Diagnosis not present

## 2023-11-08 DIAGNOSIS — M793 Panniculitis, unspecified: Secondary | ICD-10-CM | POA: Diagnosis not present

## 2023-11-08 DIAGNOSIS — M6281 Muscle weakness (generalized): Secondary | ICD-10-CM | POA: Diagnosis not present

## 2023-11-08 DIAGNOSIS — M314 Aortic arch syndrome [Takayasu]: Secondary | ICD-10-CM | POA: Diagnosis not present

## 2023-11-09 DIAGNOSIS — R262 Difficulty in walking, not elsewhere classified: Secondary | ICD-10-CM | POA: Diagnosis not present

## 2023-11-09 DIAGNOSIS — A419 Sepsis, unspecified organism: Secondary | ICD-10-CM | POA: Diagnosis not present

## 2023-11-09 DIAGNOSIS — Z6841 Body Mass Index (BMI) 40.0 and over, adult: Secondary | ICD-10-CM | POA: Diagnosis not present

## 2023-11-09 DIAGNOSIS — N179 Acute kidney failure, unspecified: Secondary | ICD-10-CM | POA: Diagnosis not present

## 2023-11-09 DIAGNOSIS — M6282 Rhabdomyolysis: Secondary | ICD-10-CM | POA: Diagnosis not present

## 2023-11-09 DIAGNOSIS — L03818 Cellulitis of other sites: Secondary | ICD-10-CM | POA: Diagnosis not present

## 2023-11-09 DIAGNOSIS — M314 Aortic arch syndrome [Takayasu]: Secondary | ICD-10-CM | POA: Diagnosis not present

## 2023-11-09 DIAGNOSIS — M793 Panniculitis, unspecified: Secondary | ICD-10-CM | POA: Diagnosis not present

## 2023-11-09 DIAGNOSIS — N390 Urinary tract infection, site not specified: Secondary | ICD-10-CM | POA: Diagnosis not present

## 2023-11-09 DIAGNOSIS — M6281 Muscle weakness (generalized): Secondary | ICD-10-CM | POA: Diagnosis not present

## 2023-11-09 DIAGNOSIS — G9341 Metabolic encephalopathy: Secondary | ICD-10-CM | POA: Diagnosis not present

## 2023-11-10 DIAGNOSIS — M314 Aortic arch syndrome [Takayasu]: Secondary | ICD-10-CM | POA: Diagnosis not present

## 2023-11-10 DIAGNOSIS — M6281 Muscle weakness (generalized): Secondary | ICD-10-CM | POA: Diagnosis not present

## 2023-11-10 DIAGNOSIS — R262 Difficulty in walking, not elsewhere classified: Secondary | ICD-10-CM | POA: Diagnosis not present

## 2023-11-10 DIAGNOSIS — L03818 Cellulitis of other sites: Secondary | ICD-10-CM | POA: Diagnosis not present

## 2023-11-10 DIAGNOSIS — M793 Panniculitis, unspecified: Secondary | ICD-10-CM | POA: Diagnosis not present

## 2023-11-10 DIAGNOSIS — N179 Acute kidney failure, unspecified: Secondary | ICD-10-CM | POA: Diagnosis not present

## 2023-11-10 DIAGNOSIS — M6282 Rhabdomyolysis: Secondary | ICD-10-CM | POA: Diagnosis not present

## 2023-11-10 DIAGNOSIS — N390 Urinary tract infection, site not specified: Secondary | ICD-10-CM | POA: Diagnosis not present

## 2023-11-10 DIAGNOSIS — Z6841 Body Mass Index (BMI) 40.0 and over, adult: Secondary | ICD-10-CM | POA: Diagnosis not present

## 2023-11-10 DIAGNOSIS — A419 Sepsis, unspecified organism: Secondary | ICD-10-CM | POA: Diagnosis not present

## 2023-11-10 DIAGNOSIS — G9341 Metabolic encephalopathy: Secondary | ICD-10-CM | POA: Diagnosis not present

## 2023-11-12 DIAGNOSIS — L03818 Cellulitis of other sites: Secondary | ICD-10-CM | POA: Diagnosis not present

## 2023-11-12 DIAGNOSIS — G9341 Metabolic encephalopathy: Secondary | ICD-10-CM | POA: Diagnosis not present

## 2023-11-12 DIAGNOSIS — R262 Difficulty in walking, not elsewhere classified: Secondary | ICD-10-CM | POA: Diagnosis not present

## 2023-11-12 DIAGNOSIS — N179 Acute kidney failure, unspecified: Secondary | ICD-10-CM | POA: Diagnosis not present

## 2023-11-12 DIAGNOSIS — M314 Aortic arch syndrome [Takayasu]: Secondary | ICD-10-CM | POA: Diagnosis not present

## 2023-11-12 DIAGNOSIS — Z6841 Body Mass Index (BMI) 40.0 and over, adult: Secondary | ICD-10-CM | POA: Diagnosis not present

## 2023-11-12 DIAGNOSIS — M6282 Rhabdomyolysis: Secondary | ICD-10-CM | POA: Diagnosis not present

## 2023-11-12 DIAGNOSIS — M793 Panniculitis, unspecified: Secondary | ICD-10-CM | POA: Diagnosis not present

## 2023-11-12 DIAGNOSIS — N390 Urinary tract infection, site not specified: Secondary | ICD-10-CM | POA: Diagnosis not present

## 2023-11-12 DIAGNOSIS — A419 Sepsis, unspecified organism: Secondary | ICD-10-CM | POA: Diagnosis not present

## 2023-11-12 DIAGNOSIS — M6281 Muscle weakness (generalized): Secondary | ICD-10-CM | POA: Diagnosis not present

## 2023-11-13 DIAGNOSIS — M6281 Muscle weakness (generalized): Secondary | ICD-10-CM | POA: Diagnosis not present

## 2023-11-13 DIAGNOSIS — M6282 Rhabdomyolysis: Secondary | ICD-10-CM | POA: Diagnosis not present

## 2023-11-13 DIAGNOSIS — Z6841 Body Mass Index (BMI) 40.0 and over, adult: Secondary | ICD-10-CM | POA: Diagnosis not present

## 2023-11-13 DIAGNOSIS — N390 Urinary tract infection, site not specified: Secondary | ICD-10-CM | POA: Diagnosis not present

## 2023-11-13 DIAGNOSIS — N179 Acute kidney failure, unspecified: Secondary | ICD-10-CM | POA: Diagnosis not present

## 2023-11-13 DIAGNOSIS — R262 Difficulty in walking, not elsewhere classified: Secondary | ICD-10-CM | POA: Diagnosis not present

## 2023-11-13 DIAGNOSIS — L03818 Cellulitis of other sites: Secondary | ICD-10-CM | POA: Diagnosis not present

## 2023-11-13 DIAGNOSIS — M793 Panniculitis, unspecified: Secondary | ICD-10-CM | POA: Diagnosis not present

## 2023-11-13 DIAGNOSIS — M314 Aortic arch syndrome [Takayasu]: Secondary | ICD-10-CM | POA: Diagnosis not present

## 2023-11-13 DIAGNOSIS — A419 Sepsis, unspecified organism: Secondary | ICD-10-CM | POA: Diagnosis not present

## 2023-11-13 DIAGNOSIS — G9341 Metabolic encephalopathy: Secondary | ICD-10-CM | POA: Diagnosis not present

## 2023-11-14 DIAGNOSIS — M6281 Muscle weakness (generalized): Secondary | ICD-10-CM | POA: Diagnosis not present

## 2023-11-14 DIAGNOSIS — Z6841 Body Mass Index (BMI) 40.0 and over, adult: Secondary | ICD-10-CM | POA: Diagnosis not present

## 2023-11-14 DIAGNOSIS — M314 Aortic arch syndrome [Takayasu]: Secondary | ICD-10-CM | POA: Diagnosis not present

## 2023-11-14 DIAGNOSIS — R262 Difficulty in walking, not elsewhere classified: Secondary | ICD-10-CM | POA: Diagnosis not present

## 2023-11-14 DIAGNOSIS — M6282 Rhabdomyolysis: Secondary | ICD-10-CM | POA: Diagnosis not present

## 2023-11-14 DIAGNOSIS — L03818 Cellulitis of other sites: Secondary | ICD-10-CM | POA: Diagnosis not present

## 2023-11-14 DIAGNOSIS — N390 Urinary tract infection, site not specified: Secondary | ICD-10-CM | POA: Diagnosis not present

## 2023-11-14 DIAGNOSIS — A419 Sepsis, unspecified organism: Secondary | ICD-10-CM | POA: Diagnosis not present

## 2023-11-14 DIAGNOSIS — M793 Panniculitis, unspecified: Secondary | ICD-10-CM | POA: Diagnosis not present

## 2023-11-14 DIAGNOSIS — N179 Acute kidney failure, unspecified: Secondary | ICD-10-CM | POA: Diagnosis not present

## 2023-11-14 DIAGNOSIS — G9341 Metabolic encephalopathy: Secondary | ICD-10-CM | POA: Diagnosis not present

## 2023-11-15 DIAGNOSIS — M793 Panniculitis, unspecified: Secondary | ICD-10-CM | POA: Diagnosis not present

## 2023-11-15 DIAGNOSIS — Z6841 Body Mass Index (BMI) 40.0 and over, adult: Secondary | ICD-10-CM | POA: Diagnosis not present

## 2023-11-15 DIAGNOSIS — M6281 Muscle weakness (generalized): Secondary | ICD-10-CM | POA: Diagnosis not present

## 2023-11-15 DIAGNOSIS — G9341 Metabolic encephalopathy: Secondary | ICD-10-CM | POA: Diagnosis not present

## 2023-11-15 DIAGNOSIS — M314 Aortic arch syndrome [Takayasu]: Secondary | ICD-10-CM | POA: Diagnosis not present

## 2023-11-15 DIAGNOSIS — N390 Urinary tract infection, site not specified: Secondary | ICD-10-CM | POA: Diagnosis not present

## 2023-11-15 DIAGNOSIS — R262 Difficulty in walking, not elsewhere classified: Secondary | ICD-10-CM | POA: Diagnosis not present

## 2023-11-15 DIAGNOSIS — M6282 Rhabdomyolysis: Secondary | ICD-10-CM | POA: Diagnosis not present

## 2023-11-15 DIAGNOSIS — L03818 Cellulitis of other sites: Secondary | ICD-10-CM | POA: Diagnosis not present

## 2023-11-15 DIAGNOSIS — A419 Sepsis, unspecified organism: Secondary | ICD-10-CM | POA: Diagnosis not present

## 2023-11-15 DIAGNOSIS — N179 Acute kidney failure, unspecified: Secondary | ICD-10-CM | POA: Diagnosis not present

## 2023-11-17 DIAGNOSIS — M6282 Rhabdomyolysis: Secondary | ICD-10-CM | POA: Diagnosis not present

## 2023-11-17 DIAGNOSIS — A419 Sepsis, unspecified organism: Secondary | ICD-10-CM | POA: Diagnosis not present

## 2023-11-17 DIAGNOSIS — N390 Urinary tract infection, site not specified: Secondary | ICD-10-CM | POA: Diagnosis not present

## 2023-11-17 DIAGNOSIS — Z6841 Body Mass Index (BMI) 40.0 and over, adult: Secondary | ICD-10-CM | POA: Diagnosis not present

## 2023-11-17 DIAGNOSIS — L03818 Cellulitis of other sites: Secondary | ICD-10-CM | POA: Diagnosis not present

## 2023-11-17 DIAGNOSIS — N179 Acute kidney failure, unspecified: Secondary | ICD-10-CM | POA: Diagnosis not present

## 2023-11-17 DIAGNOSIS — M6281 Muscle weakness (generalized): Secondary | ICD-10-CM | POA: Diagnosis not present

## 2023-11-17 DIAGNOSIS — R262 Difficulty in walking, not elsewhere classified: Secondary | ICD-10-CM | POA: Diagnosis not present

## 2023-11-17 DIAGNOSIS — M314 Aortic arch syndrome [Takayasu]: Secondary | ICD-10-CM | POA: Diagnosis not present

## 2023-11-17 DIAGNOSIS — M793 Panniculitis, unspecified: Secondary | ICD-10-CM | POA: Diagnosis not present

## 2023-11-17 DIAGNOSIS — G9341 Metabolic encephalopathy: Secondary | ICD-10-CM | POA: Diagnosis not present

## 2023-11-18 DIAGNOSIS — A419 Sepsis, unspecified organism: Secondary | ICD-10-CM | POA: Diagnosis not present

## 2023-11-18 DIAGNOSIS — L304 Erythema intertrigo: Secondary | ICD-10-CM | POA: Diagnosis not present

## 2023-11-18 DIAGNOSIS — M6282 Rhabdomyolysis: Secondary | ICD-10-CM | POA: Diagnosis not present

## 2023-11-18 DIAGNOSIS — M793 Panniculitis, unspecified: Secondary | ICD-10-CM | POA: Diagnosis not present

## 2023-11-18 DIAGNOSIS — M6281 Muscle weakness (generalized): Secondary | ICD-10-CM | POA: Diagnosis not present

## 2023-11-18 DIAGNOSIS — N179 Acute kidney failure, unspecified: Secondary | ICD-10-CM | POA: Diagnosis not present

## 2023-11-18 DIAGNOSIS — R262 Difficulty in walking, not elsewhere classified: Secondary | ICD-10-CM | POA: Diagnosis not present

## 2023-11-18 DIAGNOSIS — M314 Aortic arch syndrome [Takayasu]: Secondary | ICD-10-CM | POA: Diagnosis not present

## 2023-11-18 DIAGNOSIS — L03818 Cellulitis of other sites: Secondary | ICD-10-CM | POA: Diagnosis not present

## 2023-11-18 DIAGNOSIS — L89150 Pressure ulcer of sacral region, unstageable: Secondary | ICD-10-CM | POA: Diagnosis not present

## 2023-11-18 DIAGNOSIS — N390 Urinary tract infection, site not specified: Secondary | ICD-10-CM | POA: Diagnosis not present

## 2023-11-18 DIAGNOSIS — Z6841 Body Mass Index (BMI) 40.0 and over, adult: Secondary | ICD-10-CM | POA: Diagnosis not present

## 2023-11-18 DIAGNOSIS — G9341 Metabolic encephalopathy: Secondary | ICD-10-CM | POA: Diagnosis not present

## 2023-11-19 DIAGNOSIS — N390 Urinary tract infection, site not specified: Secondary | ICD-10-CM | POA: Diagnosis not present

## 2023-11-19 DIAGNOSIS — G9341 Metabolic encephalopathy: Secondary | ICD-10-CM | POA: Diagnosis not present

## 2023-11-19 DIAGNOSIS — M314 Aortic arch syndrome [Takayasu]: Secondary | ICD-10-CM | POA: Diagnosis not present

## 2023-11-19 DIAGNOSIS — A419 Sepsis, unspecified organism: Secondary | ICD-10-CM | POA: Diagnosis not present

## 2023-11-19 DIAGNOSIS — Z6841 Body Mass Index (BMI) 40.0 and over, adult: Secondary | ICD-10-CM | POA: Diagnosis not present

## 2023-11-19 DIAGNOSIS — M6281 Muscle weakness (generalized): Secondary | ICD-10-CM | POA: Diagnosis not present

## 2023-11-19 DIAGNOSIS — M6282 Rhabdomyolysis: Secondary | ICD-10-CM | POA: Diagnosis not present

## 2023-11-19 DIAGNOSIS — L03818 Cellulitis of other sites: Secondary | ICD-10-CM | POA: Diagnosis not present

## 2023-11-19 DIAGNOSIS — M793 Panniculitis, unspecified: Secondary | ICD-10-CM | POA: Diagnosis not present

## 2023-11-19 DIAGNOSIS — N179 Acute kidney failure, unspecified: Secondary | ICD-10-CM | POA: Diagnosis not present

## 2023-11-19 DIAGNOSIS — R262 Difficulty in walking, not elsewhere classified: Secondary | ICD-10-CM | POA: Diagnosis not present

## 2023-11-20 DIAGNOSIS — N179 Acute kidney failure, unspecified: Secondary | ICD-10-CM | POA: Diagnosis not present

## 2023-11-20 DIAGNOSIS — M6282 Rhabdomyolysis: Secondary | ICD-10-CM | POA: Diagnosis not present

## 2023-11-20 DIAGNOSIS — M793 Panniculitis, unspecified: Secondary | ICD-10-CM | POA: Diagnosis not present

## 2023-11-20 DIAGNOSIS — M6281 Muscle weakness (generalized): Secondary | ICD-10-CM | POA: Diagnosis not present

## 2023-11-20 DIAGNOSIS — Z6841 Body Mass Index (BMI) 40.0 and over, adult: Secondary | ICD-10-CM | POA: Diagnosis not present

## 2023-11-20 DIAGNOSIS — L03818 Cellulitis of other sites: Secondary | ICD-10-CM | POA: Diagnosis not present

## 2023-11-20 DIAGNOSIS — G9341 Metabolic encephalopathy: Secondary | ICD-10-CM | POA: Diagnosis not present

## 2023-11-20 DIAGNOSIS — A419 Sepsis, unspecified organism: Secondary | ICD-10-CM | POA: Diagnosis not present

## 2023-11-20 DIAGNOSIS — R262 Difficulty in walking, not elsewhere classified: Secondary | ICD-10-CM | POA: Diagnosis not present

## 2023-11-20 DIAGNOSIS — M314 Aortic arch syndrome [Takayasu]: Secondary | ICD-10-CM | POA: Diagnosis not present

## 2023-11-20 DIAGNOSIS — N390 Urinary tract infection, site not specified: Secondary | ICD-10-CM | POA: Diagnosis not present

## 2023-11-22 ENCOUNTER — Other Ambulatory Visit: Payer: Self-pay | Admitting: Surgery

## 2023-11-22 ENCOUNTER — Other Ambulatory Visit: Payer: Self-pay

## 2023-11-22 DIAGNOSIS — A419 Sepsis, unspecified organism: Secondary | ICD-10-CM | POA: Diagnosis not present

## 2023-11-22 DIAGNOSIS — L03818 Cellulitis of other sites: Secondary | ICD-10-CM | POA: Diagnosis not present

## 2023-11-22 DIAGNOSIS — I7121 Aneurysm of the ascending aorta, without rupture: Secondary | ICD-10-CM

## 2023-11-22 DIAGNOSIS — M6281 Muscle weakness (generalized): Secondary | ICD-10-CM | POA: Diagnosis not present

## 2023-11-22 DIAGNOSIS — R262 Difficulty in walking, not elsewhere classified: Secondary | ICD-10-CM | POA: Diagnosis not present

## 2023-11-22 DIAGNOSIS — M6282 Rhabdomyolysis: Secondary | ICD-10-CM | POA: Diagnosis not present

## 2023-11-22 DIAGNOSIS — M314 Aortic arch syndrome [Takayasu]: Secondary | ICD-10-CM | POA: Diagnosis not present

## 2023-11-22 DIAGNOSIS — Z6841 Body Mass Index (BMI) 40.0 and over, adult: Secondary | ICD-10-CM | POA: Diagnosis not present

## 2023-11-22 DIAGNOSIS — N390 Urinary tract infection, site not specified: Secondary | ICD-10-CM | POA: Diagnosis not present

## 2023-11-22 DIAGNOSIS — G9341 Metabolic encephalopathy: Secondary | ICD-10-CM | POA: Diagnosis not present

## 2023-11-22 DIAGNOSIS — M793 Panniculitis, unspecified: Secondary | ICD-10-CM | POA: Diagnosis not present

## 2023-11-22 DIAGNOSIS — N179 Acute kidney failure, unspecified: Secondary | ICD-10-CM | POA: Diagnosis not present

## 2023-11-23 DIAGNOSIS — L89153 Pressure ulcer of sacral region, stage 3: Secondary | ICD-10-CM | POA: Diagnosis not present

## 2023-11-24 DIAGNOSIS — G9341 Metabolic encephalopathy: Secondary | ICD-10-CM | POA: Diagnosis not present

## 2023-11-24 DIAGNOSIS — A419 Sepsis, unspecified organism: Secondary | ICD-10-CM | POA: Diagnosis not present

## 2023-11-24 DIAGNOSIS — M79672 Pain in left foot: Secondary | ICD-10-CM | POA: Diagnosis not present

## 2023-11-24 DIAGNOSIS — L03818 Cellulitis of other sites: Secondary | ICD-10-CM | POA: Diagnosis not present

## 2023-11-24 DIAGNOSIS — Z6841 Body Mass Index (BMI) 40.0 and over, adult: Secondary | ICD-10-CM | POA: Diagnosis not present

## 2023-11-24 DIAGNOSIS — M314 Aortic arch syndrome [Takayasu]: Secondary | ICD-10-CM | POA: Diagnosis not present

## 2023-11-24 DIAGNOSIS — M793 Panniculitis, unspecified: Secondary | ICD-10-CM | POA: Diagnosis not present

## 2023-11-24 DIAGNOSIS — M79671 Pain in right foot: Secondary | ICD-10-CM | POA: Diagnosis not present

## 2023-11-24 DIAGNOSIS — N179 Acute kidney failure, unspecified: Secondary | ICD-10-CM | POA: Diagnosis not present

## 2023-11-24 DIAGNOSIS — R262 Difficulty in walking, not elsewhere classified: Secondary | ICD-10-CM | POA: Diagnosis not present

## 2023-11-24 DIAGNOSIS — M6282 Rhabdomyolysis: Secondary | ICD-10-CM | POA: Diagnosis not present

## 2023-11-24 DIAGNOSIS — M25572 Pain in left ankle and joints of left foot: Secondary | ICD-10-CM | POA: Diagnosis not present

## 2023-11-24 DIAGNOSIS — M6281 Muscle weakness (generalized): Secondary | ICD-10-CM | POA: Diagnosis not present

## 2023-11-24 DIAGNOSIS — N390 Urinary tract infection, site not specified: Secondary | ICD-10-CM | POA: Diagnosis not present

## 2023-11-24 DIAGNOSIS — M25571 Pain in right ankle and joints of right foot: Secondary | ICD-10-CM | POA: Diagnosis not present

## 2023-11-25 DIAGNOSIS — G9341 Metabolic encephalopathy: Secondary | ICD-10-CM | POA: Diagnosis not present

## 2023-11-25 DIAGNOSIS — N179 Acute kidney failure, unspecified: Secondary | ICD-10-CM | POA: Diagnosis not present

## 2023-11-25 DIAGNOSIS — J45909 Unspecified asthma, uncomplicated: Secondary | ICD-10-CM | POA: Diagnosis not present

## 2023-11-25 DIAGNOSIS — M6281 Muscle weakness (generalized): Secondary | ICD-10-CM | POA: Diagnosis not present

## 2023-11-25 DIAGNOSIS — R262 Difficulty in walking, not elsewhere classified: Secondary | ICD-10-CM | POA: Diagnosis not present

## 2023-11-25 DIAGNOSIS — M793 Panniculitis, unspecified: Secondary | ICD-10-CM | POA: Diagnosis not present

## 2023-11-25 DIAGNOSIS — M314 Aortic arch syndrome [Takayasu]: Secondary | ICD-10-CM | POA: Diagnosis not present

## 2023-11-25 DIAGNOSIS — Z6841 Body Mass Index (BMI) 40.0 and over, adult: Secondary | ICD-10-CM | POA: Diagnosis not present

## 2023-11-25 DIAGNOSIS — N390 Urinary tract infection, site not specified: Secondary | ICD-10-CM | POA: Diagnosis not present

## 2023-11-25 DIAGNOSIS — A419 Sepsis, unspecified organism: Secondary | ICD-10-CM | POA: Diagnosis not present

## 2023-11-25 DIAGNOSIS — L03818 Cellulitis of other sites: Secondary | ICD-10-CM | POA: Diagnosis not present

## 2023-11-26 DIAGNOSIS — G9341 Metabolic encephalopathy: Secondary | ICD-10-CM | POA: Diagnosis not present

## 2023-11-26 DIAGNOSIS — M6282 Rhabdomyolysis: Secondary | ICD-10-CM | POA: Diagnosis not present

## 2023-11-26 DIAGNOSIS — M314 Aortic arch syndrome [Takayasu]: Secondary | ICD-10-CM | POA: Diagnosis not present

## 2023-11-26 DIAGNOSIS — A419 Sepsis, unspecified organism: Secondary | ICD-10-CM | POA: Diagnosis not present

## 2023-11-26 DIAGNOSIS — N390 Urinary tract infection, site not specified: Secondary | ICD-10-CM | POA: Diagnosis not present

## 2023-11-26 DIAGNOSIS — R262 Difficulty in walking, not elsewhere classified: Secondary | ICD-10-CM | POA: Diagnosis not present

## 2023-11-26 DIAGNOSIS — M793 Panniculitis, unspecified: Secondary | ICD-10-CM | POA: Diagnosis not present

## 2023-11-26 DIAGNOSIS — Z6841 Body Mass Index (BMI) 40.0 and over, adult: Secondary | ICD-10-CM | POA: Diagnosis not present

## 2023-11-26 DIAGNOSIS — N179 Acute kidney failure, unspecified: Secondary | ICD-10-CM | POA: Diagnosis not present

## 2023-11-26 DIAGNOSIS — L03818 Cellulitis of other sites: Secondary | ICD-10-CM | POA: Diagnosis not present

## 2023-11-26 DIAGNOSIS — M6281 Muscle weakness (generalized): Secondary | ICD-10-CM | POA: Diagnosis not present

## 2023-11-27 DIAGNOSIS — L03818 Cellulitis of other sites: Secondary | ICD-10-CM | POA: Diagnosis not present

## 2023-11-27 DIAGNOSIS — Z6841 Body Mass Index (BMI) 40.0 and over, adult: Secondary | ICD-10-CM | POA: Diagnosis not present

## 2023-11-27 DIAGNOSIS — N390 Urinary tract infection, site not specified: Secondary | ICD-10-CM | POA: Diagnosis not present

## 2023-11-27 DIAGNOSIS — N179 Acute kidney failure, unspecified: Secondary | ICD-10-CM | POA: Diagnosis not present

## 2023-11-27 DIAGNOSIS — A419 Sepsis, unspecified organism: Secondary | ICD-10-CM | POA: Diagnosis not present

## 2023-11-27 DIAGNOSIS — M6281 Muscle weakness (generalized): Secondary | ICD-10-CM | POA: Diagnosis not present

## 2023-11-27 DIAGNOSIS — M6282 Rhabdomyolysis: Secondary | ICD-10-CM | POA: Diagnosis not present

## 2023-11-27 DIAGNOSIS — M793 Panniculitis, unspecified: Secondary | ICD-10-CM | POA: Diagnosis not present

## 2023-11-27 DIAGNOSIS — G9341 Metabolic encephalopathy: Secondary | ICD-10-CM | POA: Diagnosis not present

## 2023-11-27 DIAGNOSIS — R262 Difficulty in walking, not elsewhere classified: Secondary | ICD-10-CM | POA: Diagnosis not present

## 2023-11-27 DIAGNOSIS — M314 Aortic arch syndrome [Takayasu]: Secondary | ICD-10-CM | POA: Diagnosis not present

## 2023-11-28 ENCOUNTER — Encounter (HOSPITAL_COMMUNITY): Payer: Self-pay

## 2023-11-28 ENCOUNTER — Emergency Department (HOSPITAL_COMMUNITY)

## 2023-11-28 ENCOUNTER — Other Ambulatory Visit: Payer: Self-pay

## 2023-11-28 ENCOUNTER — Inpatient Hospital Stay (HOSPITAL_COMMUNITY)
Admission: EM | Admit: 2023-11-28 | Discharge: 2023-12-10 | DRG: 871 | Disposition: A | Discharge status: Skilled Nursing Facility | Source: Skilled Nursing Facility | Attending: Internal Medicine | Admitting: Internal Medicine

## 2023-11-28 DIAGNOSIS — I272 Pulmonary hypertension, unspecified: Secondary | ICD-10-CM | POA: Diagnosis present

## 2023-11-28 DIAGNOSIS — Z888 Allergy status to other drugs, medicaments and biological substances status: Secondary | ICD-10-CM

## 2023-11-28 DIAGNOSIS — Z7401 Bed confinement status: Secondary | ICD-10-CM | POA: Diagnosis not present

## 2023-11-28 DIAGNOSIS — R6510 Systemic inflammatory response syndrome (SIRS) of non-infectious origin without acute organ dysfunction: Secondary | ICD-10-CM | POA: Diagnosis present

## 2023-11-28 DIAGNOSIS — Z7982 Long term (current) use of aspirin: Secondary | ICD-10-CM

## 2023-11-28 DIAGNOSIS — R319 Hematuria, unspecified: Secondary | ICD-10-CM | POA: Diagnosis present

## 2023-11-28 DIAGNOSIS — M6281 Muscle weakness (generalized): Secondary | ICD-10-CM | POA: Diagnosis not present

## 2023-11-28 DIAGNOSIS — E66811 Obesity, class 1: Secondary | ICD-10-CM | POA: Diagnosis not present

## 2023-11-28 DIAGNOSIS — R000 Tachycardia, unspecified: Secondary | ICD-10-CM | POA: Diagnosis not present

## 2023-11-28 DIAGNOSIS — R339 Retention of urine, unspecified: Secondary | ICD-10-CM | POA: Diagnosis not present

## 2023-11-28 DIAGNOSIS — N3091 Cystitis, unspecified with hematuria: Secondary | ICD-10-CM

## 2023-11-28 DIAGNOSIS — E871 Hypo-osmolality and hyponatremia: Secondary | ICD-10-CM | POA: Diagnosis present

## 2023-11-28 DIAGNOSIS — E876 Hypokalemia: Secondary | ICD-10-CM | POA: Diagnosis not present

## 2023-11-28 DIAGNOSIS — N179 Acute kidney failure, unspecified: Principal | ICD-10-CM | POA: Diagnosis present

## 2023-11-28 DIAGNOSIS — E66813 Obesity, class 3: Secondary | ICD-10-CM | POA: Diagnosis not present

## 2023-11-28 DIAGNOSIS — G4733 Obstructive sleep apnea (adult) (pediatric): Secondary | ICD-10-CM | POA: Diagnosis present

## 2023-11-28 DIAGNOSIS — R262 Difficulty in walking, not elsewhere classified: Secondary | ICD-10-CM | POA: Diagnosis not present

## 2023-11-28 DIAGNOSIS — R6521 Severe sepsis with septic shock: Secondary | ICD-10-CM | POA: Diagnosis not present

## 2023-11-28 DIAGNOSIS — G629 Polyneuropathy, unspecified: Secondary | ICD-10-CM | POA: Diagnosis present

## 2023-11-28 DIAGNOSIS — R600 Localized edema: Secondary | ICD-10-CM | POA: Diagnosis present

## 2023-11-28 DIAGNOSIS — N134 Hydroureter: Secondary | ICD-10-CM | POA: Diagnosis not present

## 2023-11-28 DIAGNOSIS — G9341 Metabolic encephalopathy: Secondary | ICD-10-CM | POA: Diagnosis not present

## 2023-11-28 DIAGNOSIS — I2510 Atherosclerotic heart disease of native coronary artery without angina pectoris: Secondary | ICD-10-CM | POA: Diagnosis present

## 2023-11-28 DIAGNOSIS — D126 Benign neoplasm of colon, unspecified: Secondary | ICD-10-CM | POA: Diagnosis present

## 2023-11-28 DIAGNOSIS — M793 Panniculitis, unspecified: Secondary | ICD-10-CM | POA: Diagnosis present

## 2023-11-28 DIAGNOSIS — Z803 Family history of malignant neoplasm of breast: Secondary | ICD-10-CM

## 2023-11-28 DIAGNOSIS — N390 Urinary tract infection, site not specified: Secondary | ICD-10-CM | POA: Diagnosis not present

## 2023-11-28 DIAGNOSIS — A419 Sepsis, unspecified organism: Secondary | ICD-10-CM | POA: Diagnosis present

## 2023-11-28 DIAGNOSIS — N200 Calculus of kidney: Secondary | ICD-10-CM | POA: Diagnosis present

## 2023-11-28 DIAGNOSIS — Z8546 Personal history of malignant neoplasm of prostate: Secondary | ICD-10-CM

## 2023-11-28 DIAGNOSIS — M79671 Pain in right foot: Secondary | ICD-10-CM | POA: Diagnosis present

## 2023-11-28 DIAGNOSIS — K7460 Unspecified cirrhosis of liver: Secondary | ICD-10-CM | POA: Diagnosis not present

## 2023-11-28 DIAGNOSIS — M109 Gout, unspecified: Secondary | ICD-10-CM | POA: Diagnosis not present

## 2023-11-28 DIAGNOSIS — K5730 Diverticulosis of large intestine without perforation or abscess without bleeding: Secondary | ICD-10-CM | POA: Diagnosis not present

## 2023-11-28 DIAGNOSIS — I10 Essential (primary) hypertension: Secondary | ICD-10-CM | POA: Diagnosis present

## 2023-11-28 DIAGNOSIS — E875 Hyperkalemia: Secondary | ICD-10-CM | POA: Diagnosis present

## 2023-11-28 DIAGNOSIS — N35919 Unspecified urethral stricture, male, unspecified site: Secondary | ICD-10-CM | POA: Diagnosis not present

## 2023-11-28 DIAGNOSIS — Z860100 Personal history of colon polyps, unspecified: Secondary | ICD-10-CM | POA: Diagnosis present

## 2023-11-28 DIAGNOSIS — N2882 Megaloureter: Secondary | ICD-10-CM | POA: Diagnosis present

## 2023-11-28 DIAGNOSIS — N3081 Other cystitis with hematuria: Secondary | ICD-10-CM | POA: Diagnosis present

## 2023-11-28 DIAGNOSIS — N35911 Unspecified urethral stricture, male, meatal: Secondary | ICD-10-CM | POA: Diagnosis present

## 2023-11-28 DIAGNOSIS — E639 Nutritional deficiency, unspecified: Secondary | ICD-10-CM | POA: Diagnosis present

## 2023-11-28 DIAGNOSIS — K219 Gastro-esophageal reflux disease without esophagitis: Secondary | ICD-10-CM | POA: Diagnosis not present

## 2023-11-28 DIAGNOSIS — J9611 Chronic respiratory failure with hypoxia: Secondary | ICD-10-CM | POA: Diagnosis present

## 2023-11-28 DIAGNOSIS — K760 Fatty (change of) liver, not elsewhere classified: Secondary | ICD-10-CM | POA: Diagnosis present

## 2023-11-28 DIAGNOSIS — Z7985 Long-term (current) use of injectable non-insulin antidiabetic drugs: Secondary | ICD-10-CM | POA: Diagnosis not present

## 2023-11-28 DIAGNOSIS — Z881 Allergy status to other antibiotic agents status: Secondary | ICD-10-CM

## 2023-11-28 DIAGNOSIS — R7303 Prediabetes: Secondary | ICD-10-CM | POA: Diagnosis present

## 2023-11-28 DIAGNOSIS — I712 Thoracic aortic aneurysm, without rupture, unspecified: Secondary | ICD-10-CM | POA: Diagnosis present

## 2023-11-28 DIAGNOSIS — E785 Hyperlipidemia, unspecified: Secondary | ICD-10-CM | POA: Diagnosis present

## 2023-11-28 DIAGNOSIS — Z6841 Body Mass Index (BMI) 40.0 and over, adult: Secondary | ICD-10-CM

## 2023-11-28 DIAGNOSIS — D62 Acute posthemorrhagic anemia: Secondary | ICD-10-CM | POA: Diagnosis present

## 2023-11-28 DIAGNOSIS — E538 Deficiency of other specified B group vitamins: Secondary | ICD-10-CM | POA: Diagnosis present

## 2023-11-28 DIAGNOSIS — D649 Anemia, unspecified: Secondary | ICD-10-CM | POA: Diagnosis not present

## 2023-11-28 DIAGNOSIS — Z833 Family history of diabetes mellitus: Secondary | ICD-10-CM

## 2023-11-28 DIAGNOSIS — E559 Vitamin D deficiency, unspecified: Secondary | ICD-10-CM | POA: Diagnosis present

## 2023-11-28 DIAGNOSIS — Z515 Encounter for palliative care: Secondary | ICD-10-CM | POA: Diagnosis not present

## 2023-11-28 DIAGNOSIS — R739 Hyperglycemia, unspecified: Secondary | ICD-10-CM | POA: Diagnosis present

## 2023-11-28 DIAGNOSIS — Z923 Personal history of irradiation: Secondary | ICD-10-CM

## 2023-11-28 DIAGNOSIS — E860 Dehydration: Secondary | ICD-10-CM | POA: Diagnosis present

## 2023-11-28 DIAGNOSIS — R6520 Severe sepsis without septic shock: Secondary | ICD-10-CM | POA: Diagnosis not present

## 2023-11-28 DIAGNOSIS — R54 Age-related physical debility: Secondary | ICD-10-CM | POA: Diagnosis present

## 2023-11-28 DIAGNOSIS — J45909 Unspecified asthma, uncomplicated: Secondary | ICD-10-CM | POA: Diagnosis present

## 2023-11-28 DIAGNOSIS — E65 Localized adiposity: Secondary | ICD-10-CM | POA: Diagnosis present

## 2023-11-28 DIAGNOSIS — I350 Nonrheumatic aortic (valve) stenosis: Secondary | ICD-10-CM | POA: Diagnosis not present

## 2023-11-28 DIAGNOSIS — R338 Other retention of urine: Secondary | ICD-10-CM | POA: Diagnosis not present

## 2023-11-28 DIAGNOSIS — D72829 Elevated white blood cell count, unspecified: Secondary | ICD-10-CM | POA: Diagnosis not present

## 2023-11-28 DIAGNOSIS — K5900 Constipation, unspecified: Secondary | ICD-10-CM | POA: Diagnosis present

## 2023-11-28 DIAGNOSIS — D509 Iron deficiency anemia, unspecified: Secondary | ICD-10-CM | POA: Diagnosis present

## 2023-11-28 DIAGNOSIS — L03818 Cellulitis of other sites: Secondary | ICD-10-CM | POA: Diagnosis not present

## 2023-11-28 DIAGNOSIS — Z66 Do not resuscitate: Secondary | ICD-10-CM | POA: Diagnosis present

## 2023-11-28 DIAGNOSIS — Z9981 Dependence on supplemental oxygen: Secondary | ICD-10-CM

## 2023-11-28 DIAGNOSIS — I7121 Aneurysm of the ascending aorta, without rupture: Secondary | ICD-10-CM | POA: Diagnosis not present

## 2023-11-28 DIAGNOSIS — R0602 Shortness of breath: Secondary | ICD-10-CM | POA: Diagnosis present

## 2023-11-28 DIAGNOSIS — M6282 Rhabdomyolysis: Secondary | ICD-10-CM | POA: Diagnosis not present

## 2023-11-28 DIAGNOSIS — R109 Unspecified abdominal pain: Secondary | ICD-10-CM | POA: Diagnosis not present

## 2023-11-28 DIAGNOSIS — R310 Gross hematuria: Secondary | ICD-10-CM | POA: Diagnosis not present

## 2023-11-28 DIAGNOSIS — K7290 Hepatic failure, unspecified without coma: Secondary | ICD-10-CM | POA: Diagnosis not present

## 2023-11-28 DIAGNOSIS — Z79899 Other long term (current) drug therapy: Secondary | ICD-10-CM

## 2023-11-28 DIAGNOSIS — Z7189 Other specified counseling: Secondary | ICD-10-CM | POA: Diagnosis not present

## 2023-11-28 DIAGNOSIS — M314 Aortic arch syndrome [Takayasu]: Secondary | ICD-10-CM | POA: Diagnosis not present

## 2023-11-28 DIAGNOSIS — N281 Cyst of kidney, acquired: Secondary | ICD-10-CM | POA: Diagnosis not present

## 2023-11-28 DIAGNOSIS — M6259 Muscle wasting and atrophy, not elsewhere classified, multiple sites: Secondary | ICD-10-CM | POA: Diagnosis present

## 2023-11-28 DIAGNOSIS — R531 Weakness: Secondary | ICD-10-CM | POA: Diagnosis not present

## 2023-11-28 DIAGNOSIS — N3942 Incontinence without sensory awareness: Secondary | ICD-10-CM | POA: Diagnosis not present

## 2023-11-28 LAB — COMPREHENSIVE METABOLIC PANEL WITH GFR
ALT: 25 U/L (ref 0–44)
AST: 42 U/L — ABNORMAL HIGH (ref 15–41)
Albumin: 2.2 g/dL — ABNORMAL LOW (ref 3.5–5.0)
Alkaline Phosphatase: 125 U/L (ref 38–126)
Anion gap: 15 (ref 5–15)
BUN: 117 mg/dL — ABNORMAL HIGH (ref 8–23)
CO2: 25 mmol/L (ref 22–32)
Calcium: 9.3 mg/dL (ref 8.9–10.3)
Chloride: 90 mmol/L — ABNORMAL LOW (ref 98–111)
Creatinine, Ser: 5.14 mg/dL — ABNORMAL HIGH (ref 0.61–1.24)
GFR, Estimated: 11 mL/min — ABNORMAL LOW (ref >60)
Glucose, Bld: 135 mg/dL — ABNORMAL HIGH (ref 70–99)
Potassium: 6.8 mmol/L (ref 3.5–5.1)
Sodium: 130 mmol/L — ABNORMAL LOW (ref 135–145)
Total Bilirubin: 0.5 mg/dL (ref 0.0–1.2)
Total Protein: 8.9 g/dL — ABNORMAL HIGH (ref 6.5–8.1)

## 2023-11-28 LAB — CBC WITH DIFFERENTIAL/PLATELET
Abs Immature Granulocytes: 0.22 K/uL — ABNORMAL HIGH (ref 0.00–0.07)
Basophils Absolute: 0.1 K/uL (ref 0.0–0.1)
Basophils Relative: 0 %
Eosinophils Absolute: 0 K/uL (ref 0.0–0.5)
Eosinophils Relative: 0 %
HCT: 29.5 % — ABNORMAL LOW (ref 39.0–52.0)
Hemoglobin: 8.6 g/dL — ABNORMAL LOW (ref 13.0–17.0)
Immature Granulocytes: 1 %
Lymphocytes Relative: 3 %
Lymphs Abs: 0.8 K/uL (ref 0.7–4.0)
MCH: 26.2 pg (ref 26.0–34.0)
MCHC: 29.2 g/dL — ABNORMAL LOW (ref 30.0–36.0)
MCV: 89.9 fL (ref 80.0–100.0)
Monocytes Absolute: 1.3 K/uL — ABNORMAL HIGH (ref 0.1–1.0)
Monocytes Relative: 5 %
Neutro Abs: 23.6 K/uL — ABNORMAL HIGH (ref 1.7–7.7)
Neutrophils Relative %: 91 %
Platelets: 524 K/uL — ABNORMAL HIGH (ref 150–400)
RBC: 3.28 MIL/uL — ABNORMAL LOW (ref 4.22–5.81)
RDW: 16.8 % — ABNORMAL HIGH (ref 11.5–15.5)
WBC: 26 K/uL — ABNORMAL HIGH (ref 4.0–10.5)
nRBC: 0 % (ref 0.0–0.2)

## 2023-11-28 LAB — CBG MONITORING, ED: Glucose-Capillary: 120 mg/dL — ABNORMAL HIGH (ref 70–99)

## 2023-11-28 MED ORDER — ALBUTEROL SULFATE (2.5 MG/3ML) 0.083% IN NEBU
10.0000 mg | INHALATION_SOLUTION | Freq: Once | RESPIRATORY_TRACT | Status: AC
  Administered 2023-11-28: 10 mg via RESPIRATORY_TRACT
  Filled 2023-11-28: qty 12

## 2023-11-28 MED ORDER — SODIUM CHLORIDE 0.9 % IV SOLN
2.0000 g | Freq: Once | INTRAVENOUS | Status: AC
  Administered 2023-11-29: 2 g via INTRAVENOUS
  Filled 2023-11-28: qty 12.5

## 2023-11-28 MED ORDER — CALCIUM GLUCONATE-NACL 1-0.675 GM/50ML-% IV SOLN
1.0000 g | Freq: Once | INTRAVENOUS | Status: AC
  Administered 2023-11-28: 1000 mg via INTRAVENOUS
  Filled 2023-11-28: qty 50

## 2023-11-28 MED ORDER — INSULIN ASPART 100 UNIT/ML IV SOLN
5.0000 [IU] | Freq: Once | INTRAVENOUS | Status: AC
  Administered 2023-11-28: 5 [IU] via INTRAVENOUS

## 2023-11-28 MED ORDER — DEXTROSE 50 % IV SOLN
1.0000 | Freq: Once | INTRAVENOUS | Status: AC
  Administered 2023-11-28: 50 mL via INTRAVENOUS
  Filled 2023-11-28: qty 50

## 2023-11-28 MED ORDER — LACTATED RINGERS IV BOLUS
1000.0000 mL | Freq: Once | INTRAVENOUS | Status: AC
  Administered 2023-11-28: 1000 mL via INTRAVENOUS

## 2023-11-28 MED ORDER — SODIUM ZIRCONIUM CYCLOSILICATE 10 G PO PACK
10.0000 g | PACK | Freq: Once | ORAL | Status: AC
  Administered 2023-11-28: 10 g via ORAL
  Filled 2023-11-28: qty 1

## 2023-11-28 NOTE — ED Notes (Signed)
Urology remains at bedside attempting foley placement.

## 2023-11-28 NOTE — ED Notes (Signed)
 Lab to come and attempt blood cultures and other labs. Provider aware of difficulty obtaining blood.

## 2023-11-28 NOTE — ED Notes (Signed)
 Multiple attempts made to bladder scan and place a foley in patient. Unable to place.  Unable to obtain a bladder scan.  Pt continues to report the sensation to urinate and pressure but not able to go.   Provider aware.
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
## 2023-11-28 NOTE — ED Notes (Signed)
Urology at bedside attempting foley placement.

## 2023-11-28 NOTE — ED Notes (Signed)
 Pt moved into a room and placed on the monitor.  Rolled and cleaned of stool. No urine noted. Bright red blood noted around his penis. Provider aware. Attempted to bladder scan without success. Pt attempted to urinate but unable too.

## 2023-11-28 NOTE — ED Provider Notes (Signed)
 [place] EMERGENCY DEPARTMENT AT [facility] Provider Note   CSN: [id number] Arrival date & time: 11/28/23  2019     Patient presents with: Dysuria   Curtis Hill is a 78 y.o. male.   HPI 78 year old male with a history of UTIs, thoracic aortic ascending aortic aneurysm, CAD, pulmonary hypertension, prostate cancer, and multiple other comorbidities presents with hematuria.  Was recently in the hospital for a UTI.  Previous to this he was here for panniculitis.  Patient denies any acute complaints though states he was recently put on an unknown antibiotic by the nursing facility for what he thinks was a UTI.  The hematuria started today.  He is not on any blood thinners.  Right now he feels like he needs to urinate but cannot.  Prior to Admission medications   Medication Sig Start Date End Date Taking? Authorizing Provider  albuterol  (VENTOLIN  HFA) 108 (90 Base) MCG/ACT inhaler Inhale 2 puffs into the lungs every 6 (six) hours as needed for wheezing or shortness of breath.    [provider]  allopurinol  (ZYLOPRIM ) 300 MG tablet Take 300 mg by mouth daily.    [provider]  ammonium lactate (LAC-HYDRIN) 12 % lotion Apply 1 Application topically 2 (two) times daily as needed for dry skin. Apply to bilateral legs and feet two times a day for dry, irritated skin. Rub in well.    [provider]  aspirin  EC 81 MG tablet Take 81 mg by mouth daily. Swallow whole.    [provider]  atorvastatin  (LIPITOR) 20 MG tablet Take 20 mg by mouth every evening.    [provider]  calcium  carbonate (TUMS - DOSED IN MG ELEMENTAL CALCIUM ) 500 MG chewable tablet Chew 1 tablet by mouth 2 (two) times daily. Give for calcium  supplementation    [provider]  cholecalciferol (VITAMIN D3) 25 MCG (1000 UNIT) tablet Take 1,000 Units by mouth daily.    [provider]  Coenzyme Q10 (COQ10) 200 MG CAPS Take 1 capsule by mouth in the  morning.    [provider]  collagenase  (SANTYL ) 250 UNIT/GM ointment Apply topically daily. 10/25/23   Lue Elsie BROCKS, MD  cyanocobalamin (VITAMIN B12) 500 MCG tablet Take 1,000 mcg by mouth daily. 09/09/17   [provider]  furosemide  (LASIX ) 40 MG tablet Take 40 mg by mouth daily as needed for edema or fluid.    [provider]  gabapentin  (NEURONTIN ) 300 MG capsule Take 300 mg by mouth 3 (three) times daily. Give one capsule three times daily for nerve pain .    [provider]  Magnesium  200 MG TABS Take 200 mg by mouth daily at 12 noon.    [provider]  Multiple Vitamins-Minerals (CENTRUM SILVER 50+MEN) TABS Take 1 tablet by mouth in the morning.    [provider]  Nystatin  (GERHARDT'S BUTT CREAM) CREA Apply 1 Application topically 3 (three) times daily. 10/25/23   Lue Elsie BROCKS, MD  nystatin  (MYCOSTATIN /NYSTOP ) powder Apply topically 3 (three) times daily. Patient taking differently: Apply 1 Application topically 3 (three) times daily. Apply to groin topically three times daily. 09/13/23   Wouk, Devaughn Sayres, MD  Propyl Glycol-Hydroxyethylcell (NASAL MOIST) GEL Place 1 Units into both nostrils daily. Give for dryness    [provider]  Semaglutide-Weight Management 0.25 MG/0.5ML SOAJ Inject 0.25 mg into the skin once a week. Inject every Wednesday. 10/13/23   [provider]  simethicone  (MYLICON) 80 MG chewable  tablet Chew 160 mg by mouth 3 (three) times daily after meals. Give for GI distress    [provider]  traMADol  (ULTRAM ) 50 MG tablet Take 1 tablet (50 mg total) by mouth 2 (two) times daily as needed for moderate pain (pain score 4-6) or severe pain (pain score 7-10). Patient taking differently: Take 50 mg by mouth every 6 (six) hours as needed for moderate pain (pain score 4-6) or severe pain (pain score 7-10). 09/13/23   Wouk, Devaughn Sayres, MD    Allergies: Levofloxacin , Lisinopril, and  Prednisone     Review of Systems  Constitutional:  Negative for fever.  Gastrointestinal:  Negative for abdominal pain.  Genitourinary:  Positive for hematuria. Negative for dysuria and flank pain.  Musculoskeletal:  Negative for back pain.    Updated Vital Signs BP (!) 117/99 (BP Location: Right Arm)   Pulse (!) 123   Temp 98.8 F (37.1 C) (Oral)   Resp (!) 22   SpO2 97%   Physical Exam Vitals and nursing note reviewed. Exam conducted with a chaperone present.  Constitutional:      Appearance: He is well-developed. He is obese. He is not ill-appearing or diaphoretic.  HENT:     Head: Normocephalic and atraumatic.  Cardiovascular:     Rate and Rhythm: Regular rhythm. Tachycardia present.     Heart sounds: Normal heart sounds.  Pulmonary:     Effort: Pulmonary effort is normal.     Breath sounds: Normal breath sounds.  Abdominal:     Palpations: Abdomen is soft.     Tenderness: There is no abdominal tenderness.  Genitourinary:    Comments: Difficult exam due to large pannus.  No obvious testicular abnormality.  Unable to find the urethral opening. Skin:    General: Skin is warm and dry.  Neurological:     Mental Status: He is alert.     (all labs ordered are listed, but only abnormal results are displayed) Labs Reviewed  COMPREHENSIVE METABOLIC PANEL WITH GFR - Abnormal; Notable for the following components:      Result Value   Sodium 130 (*)    Potassium 6.8 (*)    Chloride 90 (*)    Glucose, Bld 135 (*)    BUN 117 (*)    Creatinine, Ser 5.14 (*)    Total Protein 8.9 (*)    Albumin  2.2 (*)    AST 42 (*)    GFR, Estimated 11 (*)    All other components within normal limits  CBC WITH DIFFERENTIAL/PLATELET - Abnormal; Notable for the following components:   WBC 26.0 (*)    RBC 3.28 (*)    Hemoglobin 8.6 (*)    HCT 29.5 (*)    MCHC 29.2 (*)    RDW 16.8 (*)    Platelets 524 (*)    Neutro Abs 23.6 (*)    Monocytes Absolute 1.3 (*)    Abs Immature  Granulocytes 0.22 (*)    All other components within normal limits  CBG MONITORING, ED - Abnormal; Notable for the following components:   Glucose-Capillary 120 (*)    All other components within normal limits  CULTURE, BLOOD (ROUTINE X 2)  CULTURE, BLOOD (ROUTINE X 2)  URINALYSIS, W/ REFLEX TO CULTURE (INFECTION SUSPECTED)  I-STAT CG4 LACTIC ACID, ED    EKG: EKG Interpretation Date/Time:  Sunday November 28 2023 22:26:03 EDT Ventricular Rate:  112 PR Interval:  92 QRS Duration:  152 QT Interval:  377 QTC Calculation: 515 R Axis:  269  Text Interpretation: Sinus tachycardia with irregular rate RBBB and LAFB Probable left ventricular hypertrophy  overall similar to July 2025 Confirmed by Freddi Hamilton [phone]) on 11/28/2023 89:71:75 PM  Radiology: No results found.   .Critical Care  Performed by: Freddi Hamilton, MD Authorized by: Freddi Hamilton, MD   Critical care provider statement:    Critical care time (minutes):  40   Critical care time was exclusive of:  Separately billable procedures and treating other patients   Critical care was necessary to treat or prevent imminent or life-threatening deterioration of the following conditions:  Renal failure and metabolic crisis   Critical care was time spent personally by me on the following activities:  Development of treatment plan with patient or surrogate, discussions with consultants, evaluation of patient's response to treatment, examination of patient, ordering and review of laboratory studies, ordering and review of radiographic studies, ordering and performing treatments and interventions, pulse oximetry, re-evaluation of patient's condition and review of old charts    Medications Ordered in the ED  calcium  gluconate 1 g/ 50 mL sodium chloride  IVPB (1,000 mg Intravenous New Bag/Given 11/28/23 2239)  lactated ringers  bolus 1,000 mL (1,000 mLs Intravenous New Bag/Given 11/28/23 2239)  sodium zirconium cyclosilicate  (LOKELMA )  packet 10 g (10 g Oral Given 11/28/23 2242)  albuterol  (PROVENTIL ) (2.5 MG/3ML) 0.083% nebulizer solution 10 mg (10 mg Nebulization Given 11/28/23 2240)  insulin  aspart (novoLOG ) injection 5 Units (5 Units Intravenous Given 11/28/23 2240)    And  dextrose  50 % solution 50 mL (50 mLs Intravenous Given 11/28/23 2240)  lactated ringers  bolus 1,000 mL (1,000 mLs Intravenous New Bag/Given 11/28/23 2241)                                    Medical Decision Making Amount and/or Complexity of Data Reviewed Labs: ordered.    Details: Acute kidney injury with hyperkalemia Radiology: ordered. ECG/medicine tests: ordered and independent interpretation performed.    Details: Right bundle branch block but no significant change since July 2025  Risk OTC drugs. Prescription drug management.   Patient presents with hematuria.  He reports he has been able to urinate but is unable to urinate here and his creatinine is significantly elevated at 5.1.  Probably this is from obstruction.  Unfortunately the nursing staff is unable to get a Foley catheter.  Discussed with Dr. Elisabeth of urology who is at the bedside trying to get a catheter.  He was treated for his hyperkalemia with fluids, albuterol , insulin , glucose and Lokelma .  Will give calcium  though I do not see any obvious acute ECG changes though his potassium is nearly 7.  Care transferred to Dr. Melvenia.  Dr. Elisabeth is still working on trying to get a catheter.     Final diagnoses:  Acute kidney injury [facility])  Hyperkalemia    ED Discharge Orders     None          Freddi Hamilton, MD 11/28/23 2317

## 2023-11-28 NOTE — ED Triage Notes (Signed)
 Patient BIB EMS from [facility] with complaint of new onset of blood in urine.    Patient reports bloody urine starting today. Patient denies pain with urination, reports feeling of not fully emptying bladder for a while  Denies pain

## 2023-11-29 ENCOUNTER — Emergency Department (HOSPITAL_COMMUNITY)

## 2023-11-29 DIAGNOSIS — E871 Hypo-osmolality and hyponatremia: Secondary | ICD-10-CM | POA: Diagnosis present

## 2023-11-29 DIAGNOSIS — M109 Gout, unspecified: Secondary | ICD-10-CM | POA: Diagnosis present

## 2023-11-29 DIAGNOSIS — D72829 Elevated white blood cell count, unspecified: Secondary | ICD-10-CM

## 2023-11-29 DIAGNOSIS — R319 Hematuria, unspecified: Secondary | ICD-10-CM | POA: Diagnosis present

## 2023-11-29 DIAGNOSIS — D62 Acute posthemorrhagic anemia: Secondary | ICD-10-CM | POA: Diagnosis present

## 2023-11-29 DIAGNOSIS — E875 Hyperkalemia: Secondary | ICD-10-CM | POA: Diagnosis present

## 2023-11-29 DIAGNOSIS — R6520 Severe sepsis without septic shock: Secondary | ICD-10-CM

## 2023-11-29 DIAGNOSIS — N179 Acute kidney failure, unspecified: Secondary | ICD-10-CM

## 2023-11-29 DIAGNOSIS — E785 Hyperlipidemia, unspecified: Secondary | ICD-10-CM | POA: Diagnosis present

## 2023-11-29 DIAGNOSIS — Z7985 Long-term (current) use of injectable non-insulin antidiabetic drugs: Secondary | ICD-10-CM | POA: Diagnosis not present

## 2023-11-29 DIAGNOSIS — I10 Essential (primary) hypertension: Secondary | ICD-10-CM | POA: Diagnosis present

## 2023-11-29 DIAGNOSIS — R6521 Severe sepsis with septic shock: Secondary | ICD-10-CM | POA: Diagnosis present

## 2023-11-29 DIAGNOSIS — D649 Anemia, unspecified: Secondary | ICD-10-CM

## 2023-11-29 DIAGNOSIS — Z66 Do not resuscitate: Secondary | ICD-10-CM | POA: Diagnosis present

## 2023-11-29 DIAGNOSIS — I272 Pulmonary hypertension, unspecified: Secondary | ICD-10-CM

## 2023-11-29 DIAGNOSIS — N3091 Cystitis, unspecified with hematuria: Secondary | ICD-10-CM

## 2023-11-29 DIAGNOSIS — A419 Sepsis, unspecified organism: Secondary | ICD-10-CM

## 2023-11-29 DIAGNOSIS — Z833 Family history of diabetes mellitus: Secondary | ICD-10-CM | POA: Diagnosis not present

## 2023-11-29 DIAGNOSIS — I712 Thoracic aortic aneurysm, without rupture, unspecified: Secondary | ICD-10-CM

## 2023-11-29 DIAGNOSIS — E66813 Obesity, class 3: Secondary | ICD-10-CM | POA: Diagnosis present

## 2023-11-29 DIAGNOSIS — I2510 Atherosclerotic heart disease of native coronary artery without angina pectoris: Secondary | ICD-10-CM | POA: Diagnosis present

## 2023-11-29 DIAGNOSIS — J9611 Chronic respiratory failure with hypoxia: Secondary | ICD-10-CM | POA: Diagnosis present

## 2023-11-29 DIAGNOSIS — I350 Nonrheumatic aortic (valve) stenosis: Secondary | ICD-10-CM | POA: Diagnosis present

## 2023-11-29 DIAGNOSIS — R310 Gross hematuria: Secondary | ICD-10-CM | POA: Diagnosis not present

## 2023-11-29 DIAGNOSIS — G4733 Obstructive sleep apnea (adult) (pediatric): Secondary | ICD-10-CM

## 2023-11-29 DIAGNOSIS — R339 Retention of urine, unspecified: Secondary | ICD-10-CM | POA: Diagnosis not present

## 2023-11-29 DIAGNOSIS — Z6841 Body Mass Index (BMI) 40.0 and over, adult: Secondary | ICD-10-CM | POA: Diagnosis not present

## 2023-11-29 DIAGNOSIS — I7121 Aneurysm of the ascending aorta, without rupture: Secondary | ICD-10-CM | POA: Diagnosis present

## 2023-11-29 DIAGNOSIS — K760 Fatty (change of) liver, not elsewhere classified: Secondary | ICD-10-CM | POA: Diagnosis present

## 2023-11-29 DIAGNOSIS — K219 Gastro-esophageal reflux disease without esophagitis: Secondary | ICD-10-CM | POA: Diagnosis present

## 2023-11-29 DIAGNOSIS — J45909 Unspecified asthma, uncomplicated: Secondary | ICD-10-CM | POA: Diagnosis present

## 2023-11-29 DIAGNOSIS — E876 Hypokalemia: Secondary | ICD-10-CM | POA: Diagnosis not present

## 2023-11-29 LAB — POCT I-STAT 7, (LYTES, BLD GAS, ICA,H+H)
Acid-Base Excess: 5 mmol/L — ABNORMAL HIGH (ref 0.0–2.0)
Bicarbonate: 30.3 mmol/L — ABNORMAL HIGH (ref 20.0–28.0)
Calcium, Ion: 1.22 mmol/L (ref 1.15–1.40)
HCT: 25 % — ABNORMAL LOW (ref 39.0–52.0)
Hemoglobin: 8.5 g/dL — ABNORMAL LOW (ref 13.0–17.0)
O2 Saturation: 97 %
Patient temperature: 99.1
Potassium: 5.6 mmol/L — ABNORMAL HIGH (ref 3.5–5.1)
Sodium: 132 mmol/L — ABNORMAL LOW (ref 135–145)
TCO2: 32 mmol/L (ref 22–32)
pCO2 arterial: 47.4 mmHg (ref 32–48)
pH, Arterial: 7.415 (ref 7.35–7.45)
pO2, Arterial: 90 mmHg (ref 83–108)

## 2023-11-29 LAB — HEMOGLOBIN AND HEMATOCRIT, BLOOD
HCT: 23.3 % — ABNORMAL LOW (ref 39.0–52.0)
HCT: 26.6 % — ABNORMAL LOW (ref 39.0–52.0)
HCT: 29.6 % — ABNORMAL LOW (ref 39.0–52.0)
Hemoglobin: 7.1 g/dL — ABNORMAL LOW (ref 13.0–17.0)
Hemoglobin: 8.2 g/dL — ABNORMAL LOW (ref 13.0–17.0)
Hemoglobin: 9.1 g/dL — ABNORMAL LOW (ref 13.0–17.0)

## 2023-11-29 LAB — COMPREHENSIVE METABOLIC PANEL WITH GFR
ALT: 24 U/L (ref 0–44)
AST: 35 U/L (ref 15–41)
Albumin: 2 g/dL — ABNORMAL LOW (ref 3.5–5.0)
Alkaline Phosphatase: 122 U/L (ref 38–126)
Anion gap: 11 (ref 5–15)
BUN: 108 mg/dL — ABNORMAL HIGH (ref 8–23)
CO2: 26 mmol/L (ref 22–32)
Calcium: 8.8 mg/dL — ABNORMAL LOW (ref 8.9–10.3)
Chloride: 94 mmol/L — ABNORMAL LOW (ref 98–111)
Creatinine, Ser: 4.54 mg/dL — ABNORMAL HIGH (ref 0.61–1.24)
GFR, Estimated: 13 mL/min — ABNORMAL LOW (ref >60)
Glucose, Bld: 129 mg/dL — ABNORMAL HIGH (ref 70–99)
Potassium: 6.1 mmol/L — ABNORMAL HIGH (ref 3.5–5.1)
Sodium: 131 mmol/L — ABNORMAL LOW (ref 135–145)
Total Bilirubin: 0.5 mg/dL (ref 0.0–1.2)
Total Protein: 7.8 g/dL (ref 6.5–8.1)

## 2023-11-29 LAB — BASIC METABOLIC PANEL WITH GFR
Anion gap: 11 (ref 5–15)
Anion gap: 11 (ref 5–15)
Anion gap: 10 (ref 5–15)
BUN: 103 mg/dL — ABNORMAL HIGH (ref 8–23)
BUN: 94 mg/dL — ABNORMAL HIGH (ref 8–23)
BUN: 92 mg/dL — ABNORMAL HIGH (ref 8–23)
CO2: 25 mmol/L (ref 22–32)
CO2: 28 mmol/L (ref 22–32)
CO2: 28 mmol/L (ref 22–32)
Calcium: 8.7 mg/dL — ABNORMAL LOW (ref 8.9–10.3)
Calcium: 8.8 mg/dL — ABNORMAL LOW (ref 8.9–10.3)
Calcium: 8.9 mg/dL (ref 8.9–10.3)
Chloride: 95 mmol/L — ABNORMAL LOW (ref 98–111)
Chloride: 98 mmol/L (ref 98–111)
Chloride: 99 mmol/L (ref 98–111)
Creatinine, Ser: 3.9 mg/dL — ABNORMAL HIGH (ref 0.61–1.24)
Creatinine, Ser: 3.18 mg/dL — ABNORMAL HIGH (ref 0.61–1.24)
Creatinine, Ser: 3.14 mg/dL — ABNORMAL HIGH (ref 0.61–1.24)
GFR, Estimated: 15 mL/min — ABNORMAL LOW (ref >60)
GFR, Estimated: 19 mL/min — ABNORMAL LOW (ref >60)
GFR, Estimated: 20 mL/min — ABNORMAL LOW (ref >60)
Glucose, Bld: 127 mg/dL — ABNORMAL HIGH (ref 70–99)
Glucose, Bld: 108 mg/dL — ABNORMAL HIGH (ref 70–99)
Glucose, Bld: 102 mg/dL — ABNORMAL HIGH (ref 70–99)
Potassium: 5.6 mmol/L — ABNORMAL HIGH (ref 3.5–5.1)
Potassium: 4.7 mmol/L (ref 3.5–5.1)
Potassium: 5.1 mmol/L (ref 3.5–5.1)
Sodium: 131 mmol/L — ABNORMAL LOW (ref 135–145)
Sodium: 137 mmol/L (ref 135–145)
Sodium: 137 mmol/L (ref 135–145)

## 2023-11-29 LAB — I-STAT CG4 LACTIC ACID, ED: Lactic Acid, Venous: 1.7 mmol/L (ref 0.5–1.9)

## 2023-11-29 LAB — PHOSPHORUS
Phosphorus: 5.2 mg/dL — ABNORMAL HIGH (ref 2.5–4.6)
Phosphorus: 5 mg/dL — ABNORMAL HIGH (ref 2.5–4.6)

## 2023-11-29 LAB — GLUCOSE, CAPILLARY: Glucose-Capillary: 123 mg/dL — ABNORMAL HIGH (ref 70–99)

## 2023-11-29 LAB — URINALYSIS, W/ REFLEX TO CULTURE (INFECTION SUSPECTED)
RBC / HPF: >50 RBC/hpf (ref 0–5)
WBC, UA: >50 WBC/hpf (ref 0–5)

## 2023-11-29 LAB — APTT: aPTT: 37 s — ABNORMAL HIGH (ref 24–36)

## 2023-11-29 LAB — PREPARE RBC (CROSSMATCH)

## 2023-11-29 LAB — PROTIME-INR
INR: 1.1 (ref 0.8–1.2)
Prothrombin Time: 14.9 s (ref 11.4–15.2)

## 2023-11-29 LAB — CBC
HCT: 25.9 % — ABNORMAL LOW (ref 39.0–52.0)
Hemoglobin: 7.6 g/dL — ABNORMAL LOW (ref 13.0–17.0)
MCH: 26.3 pg (ref 26.0–34.0)
MCHC: 29.3 g/dL — ABNORMAL LOW (ref 30.0–36.0)
MCV: 89.6 fL (ref 80.0–100.0)
Platelets: 466 K/uL — ABNORMAL HIGH (ref 150–400)
RBC: 2.89 MIL/uL — ABNORMAL LOW (ref 4.22–5.81)
RDW: 17 % — ABNORMAL HIGH (ref 11.5–15.5)
WBC: 25.7 K/uL — ABNORMAL HIGH (ref 4.0–10.5)
nRBC: 0 % (ref 0.0–0.2)

## 2023-11-29 LAB — LACTIC ACID, PLASMA
Lactic Acid, Venous: 2.1 mmol/L (ref 0.5–1.9)
Lactic Acid, Venous: 1 mmol/L (ref 0.5–1.9)

## 2023-11-29 LAB — MAGNESIUM: Magnesium: 2 mg/dL (ref 1.7–2.4)

## 2023-11-29 LAB — CREATININE, URINE, RANDOM: Creatinine, Urine: 12 mg/dL

## 2023-11-29 LAB — MRSA NEXT GEN BY PCR, NASAL: MRSA by PCR Next Gen: NOT DETECTED

## 2023-11-29 LAB — BRAIN NATRIURETIC PEPTIDE: B Natriuretic Peptide: 57.2 pg/mL (ref 0.0–100.0)

## 2023-11-29 MED ORDER — POLYETHYLENE GLYCOL 3350 17 G PO PACK: 17.0000 g | PACK | Freq: Every day | ORAL | Status: DC | PRN

## 2023-11-29 MED ORDER — DEXTROSE 50 % IV SOLN
1.0000 | Freq: Once | INTRAVENOUS | Status: AC
  Administered 2023-11-29: 50 mL via INTRAVENOUS
  Filled 2023-11-29: qty 50

## 2023-11-29 MED ORDER — ORAL CARE MOUTH RINSE: 15.0000 mL | OROMUCOSAL | Status: DC | PRN

## 2023-11-29 MED ORDER — SODIUM CHLORIDE 0.9% IV SOLUTION: Freq: Once | INTRAVENOUS | Status: DC

## 2023-11-29 MED ORDER — ACETAMINOPHEN 325 MG PO TABS
650.0000 mg | ORAL_TABLET | Freq: Four times a day (QID) | ORAL | Status: DC | PRN
  Administered 2023-11-30 (×3): 650 mg via ORAL
  Filled 2023-11-29 (×4): qty 2

## 2023-11-29 MED ORDER — INSULIN ASPART 100 UNIT/ML IV SOLN
5.0000 [IU] | Freq: Once | INTRAVENOUS | Status: AC
  Administered 2023-11-29: 5 [IU] via INTRAVENOUS

## 2023-11-29 MED ORDER — SODIUM CHLORIDE 0.9 % IV SOLN: 2.0000 g | INTRAVENOUS | Status: DC

## 2023-11-29 MED ORDER — NOREPINEPHRINE 4 MG/250ML-% IV SOLN
0.0000 ug/min | INTRAVENOUS | Status: DC
  Filled 2023-11-29: qty 250

## 2023-11-29 MED ORDER — CHLORHEXIDINE GLUCONATE CLOTH 2 % EX PADS
6.0000 | MEDICATED_PAD | Freq: Every day | CUTANEOUS | Status: DC
  Administered 2023-11-29 – 2023-12-10 (×12): 6 via TOPICAL

## 2023-11-29 MED ORDER — PANTOPRAZOLE SODIUM 40 MG IV SOLR
40.0000 mg | Freq: Every day | INTRAVENOUS | Status: DC
  Administered 2023-11-29: 40 mg via INTRAVENOUS
  Filled 2023-11-29: qty 10

## 2023-11-29 MED ORDER — LACTATED RINGERS IV SOLN: INTRAVENOUS | Status: AC

## 2023-11-29 MED ORDER — SODIUM CHLORIDE 0.9 % IV SOLN
2.0000 g | INTRAVENOUS | Status: DC
  Administered 2023-11-29 – 2023-11-30 (×2): 2 g via INTRAVENOUS
  Filled 2023-11-29 (×2): qty 12.5

## 2023-11-29 MED ORDER — SODIUM CHLORIDE 0.9 % IV SOLN: INTRAVENOUS | Status: AC | PRN

## 2023-11-29 MED ORDER — DOCUSATE SODIUM 100 MG PO CAPS
100.0000 mg | ORAL_CAPSULE | Freq: Two times a day (BID) | ORAL | Status: DC | PRN
  Administered 2023-12-03: 100 mg via ORAL
  Filled 2023-11-29: qty 1

## 2023-11-29 MED ORDER — SODIUM ZIRCONIUM CYCLOSILICATE 10 G PO PACK
10.0000 g | PACK | Freq: Once | ORAL | Status: AC
  Administered 2023-11-29: 10 g via ORAL
  Filled 2023-11-29: qty 1

## 2023-11-29 MED ORDER — NOREPINEPHRINE 4 MG/250ML-% IV SOLN
0.0000 ug/min | INTRAVENOUS | Status: DC
  Administered 2023-11-29: 2 ug/min via INTRAVENOUS

## 2023-11-29 NOTE — Progress Notes (Signed)
 Initial Nutrition Assessment  DOCUMENTATION CODES:  Morbid obesity  INTERVENTION:  Encourage adequate oral intake Meal ordering with assistance Juven BID to support wound healing 30 ml ProSource Plus BID, each supplement provides 100 kcals and 15 grams protein.  Ensure Max po at bedtime, each supplement provides 150 kcal and 30 grams of protein.   NUTRITION DIAGNOSIS:  Increased nutrient needs related to wound healing as evidenced by estimated needs  GOAL:  Patient will meet greater than or equal to 90% of their needs  MONITOR:  PO intake, Supplement acceptance, Labs, Weight trends, Skin  REASON FOR ASSESSMENT:  Consult Assessment of nutrition requirement/status  ASSESSMENT:  Pt admitted with hematuria. PMH significant for thoracic aortic ascending aneurysm, CAD, pulmonary HTN, HLD, iron deficiency anemia, recently admitted 7/11-7/14 for AMS secondary to sepsis d/t pseudomonal UTI and 8/24-6/2 for sepsis d/t panniculitis  8/18: CT renal stone study- mild bilateral hydroureter, nonobstructing right nephrolithiasis, concern for bladder hemorrhage  Spoke with patient at bedside. History somewhat difficult to obtain d/t pt moaning with significant pain. RN aware and has provided pain medications.   Pt has been living at [facility] and has been bed bound since admission in May as he reports that his foot has been too painful to walk on.  He does not enjoy the food at SNF therefore reports having limited intake. He states that food is too spicy. His biggest meal is breakfast which includes eggs, a piece of bacon or sausage, grits and milk or juice. Lunch and dinner are usually fish with vegetables or a sandwich though he does not consume much of these meals.   He states that his weight has been declining since May but does not recall quantity of weight loss.   Wt Readings from Last 3 Encounters:  11/30/23 (!) 158 kg  10/23/23 (!) 196.5 kg  09/13/23 (!) 211.7 kg  If accurate this  is a 25.4% weight loss which is clinically significant for time frame.   Medications: senna BID, IV abx  Labs:  Sodium 131 Potassium 6.1 BUN 108 Cr 4.54 Phosphorus 5.2 GFR 13 Lactic acid 2.1 Hgb 7.6  NUTRITION - FOCUSED PHYSICAL EXAM: Suspect there to be underlying muscle depletions however difficult to assess d/t body habitus.  Flowsheet Row Most Recent Value  Orbital Region No depletion  Upper Arm Region No depletion  Thoracic and Lumbar Region No depletion  Buccal Region No depletion  Temple Region No depletion  Clavicle Bone Region No depletion  Clavicle and Acromion Bone Region No depletion  Scapular Bone Region No depletion  Dorsal Hand No depletion  Patellar Region Unable to assess  [moderate pitting edema]  Anterior Thigh Region Unable to assess  Posterior Calf Region Unable to assess  Edema (RD Assessment) Moderate  Hair Reviewed  Eyes Reviewed  Mouth Reviewed  Skin Reviewed  Nails Reviewed    Diet Order:   Diet Order             Diet Heart Room service appropriate? Yes with Assist; Fluid consistency: Thin  Diet effective now                   EDUCATION NEEDS:   Not appropriate for education at this time  Skin:  Skin Assessment: Skin Integrity Issues: Skin Integrity Issues:: Stage II Stage II: Per WOC (8/19) 1. Full thickness wound R pannus/abdominal fold 2. Stage 3 Pressure Injury Sacrum 3. Intertriginous dermatitis pannus  Last BM:  8/18 type 2 small  Height:   Ht  Readings from Last 1 Encounters:  09/04/23 5' 7 (1.702 m)    Weight:   Wt Readings from Last 1 Encounters:  11/30/23 (!) 158 kg    Ideal Body Weight:  67.3 kg  BMI:  Body mass index is 54.56 kg/m.  Estimated Nutritional Needs:   Kcal:  1800-2000  Protein:  125-140g  Fluid:  >/=1.8L  Curtis Hill, RDN, LDN Clinical Nutrition See AMiON for contact information.

## 2023-11-29 NOTE — Procedures (Signed)
 Arterial Line Insertion Start/End8/18/2025 5:30 AM, 11/29/2023 6:09 AM  Patient location: ICU. Preanesthetic checklist: patient identified, IV checked, site marked, risks and benefits discussed, surgical consent, monitors and equipment checked, pre-op evaluation and timeout performed Left, radial was placed Catheter size: 20 G Hand hygiene performed , maximum sterile barriers used  and Seldinger technique used Allen's test indicative of satisfactory collateral circulation Attempts: 2 Procedure performed without using ultrasound guided technique. Following insertion, Biopatch and dressing applied. Post procedure assessment: normal  Patient tolerated the procedure well with no immediate complications.

## 2023-11-29 NOTE — Progress Notes (Signed)
   11/29/23 2305  BiPAP/CPAP/SIPAP  $ Non-Invasive Home Ventilator  Initial  BiPAP/CPAP/SIPAP Pt Type Adult  BiPAP/CPAP/SIPAP Resmed  Reason BIPAP/CPAP not in use  (pt stated he doesnt want to wear CPAP because hospital CPAP doesn't have the water chamber for humidity.)

## 2023-11-29 NOTE — ED Notes (Signed)
 Pt's CBI catheter manually irrigated multiple times, no output noted. CBI clamped. Urology MD notified, keep CBI clamped until re-evaluated by urology. Will continue to monitor.

## 2023-11-29 NOTE — ED Notes (Signed)
 Provider ok to hold levo and start if MAPS drop below 70.

## 2023-11-29 NOTE — ED Notes (Signed)
 Foley irrigated due to clotting off. Now infusing again.

## 2023-11-29 NOTE — ED Notes (Signed)
 CBI initiated via urologist.

## 2023-11-29 NOTE — ED Notes (Signed)
 Numerous clots removed with manual irrigation.

## 2023-11-29 NOTE — TOC Initial Note (Signed)
 Transition of Care [facility]) - Initial/Assessment Note    Patient Details  Name: Curtis Hill MRN: [id number] Date of Birth: 1946/01/17  Transition of Care [facility]) CM/SW Contact:    Luann SHAUNNA Cumming, LCSW Phone Number: 11/29/2023, 1:53 PM  Clinical Narrative:                  CSW contact [facility] rep and confirmed pt is a short term rehab resident there. Pt can return once medically stable. TOC will continue to follow.    Expected Discharge Plan: Skilled Nursing Facility Barriers to Discharge: Continued Medical Work up               Admission diagnosis:  Hyperkalemia [E87.5] Hemorrhagic cystitis [N30.91] Urine retention [R33.9] Acute kidney injury (HCC) [N17.9] Hematuria [R31.9] Sepsis with acute renal failure and septic shock, due to unspecified organism, unspecified acute renal failure type (HCC) [A41.9, R65.21, N17.9] Patient Active Problem List   Diagnosis Date Noted   Hematuria 11/29/2023   Hemorrhagic cystitis 11/29/2023   Sepsis secondary to UTI (HCC) 10/23/2023   SIRS (systemic inflammatory response syndrome) (HCC) 09/04/2023   AKI (acute kidney injury) (HCC) 09/04/2023   OSA (obstructive sleep apnea) 09/04/2023   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   Peripheral edema 12/07/2020   B12 deficiency 11/14/2020   Essential hypertension 08/15/2020   Visceral obesity 07/29/2020   Prediabetes 06/20/2020   Vitamin D deficiency 05/23/2020   Hyperglycemia 05/23/2020   Shortness of breath on exertion 05/23/2020   Debility 05/23/2020   Nutrition impaired due to imbalance of nutrients 05/23/2020   Depression screening 05/23/2020   Thoracic ascending aortic aneurysm (HCC) 11/22/2019   Panniculitis 10/31/2019   History of colonic polyps 02/27/2019   Pulmonary HTN (HCC) 11/02/2018   CAD (coronary artery disease) 10/20/2018   Aortic stenosis 07/19/2018   Hyperlipidemia LDL goal <70 07/19/2018   Hyperlipidemia    Coronary artery calcification  seen on CAT scan 03/17/2018   Benign essential HTN 03/17/2018   Thoracic aortic aneurysm (TAA) (HCC) 03/17/2018   Benign neoplasm of colon 03/28/2012   BMI 70 and over, adult (HCC) 05/18/2007   ANEMIA, IRON DEFICIENCY 05/18/2007   Leukocytosis 05/18/2007   Disease of blood and blood forming organ 05/18/2007   CARDIOMEGALY 05/18/2007   SINUSITIS, ACUTE 05/18/2007   Diaphragmatic hernia 05/18/2007   Abdominal pain 05/18/2007   EPIGASTRIC PAIN 05/18/2007   GOUT, HX OF 05/18/2007   PCP:  Okey Carlin Redbird, MD Pharmacy:   [facility] [place], [place] - [address] [place] [address] Phone: [phone] Fax: [phone]     Social Drivers of Health (SDOH) Social History: SDOH Screenings   Food Insecurity: No Food Insecurity (10/23/2023)  Housing: Low Risk  (10/23/2023)  Transportation Needs: Unmet Transportation Needs (10/23/2023)  Utilities: Not At Risk (10/23/2023)  Depression (PHQ2-9): Medium Risk (05/23/2020)  Social Connections: Socially Integrated (10/23/2023)  Tobacco Use: Low Risk  (11/28/2023)   SDOH Interventions:     Readmission Risk Interventions     No data to display

## 2023-11-29 NOTE — ED Notes (Signed)
 Foley clotted off, irrigated, and CBI continued.

## 2023-11-29 NOTE — Progress Notes (Signed)
 eLink Physician-Brief Progress Note Patient Name: Curtis Hill DOB: [birth year] MRN: [id number]   Date of Service  11/29/2023  HPI/Events of Note  Septic shock, UTI Adm with hematuria, AKI & obs uropathy Foley placd by urology after urethral dilatation Hyperkalemia trreated  eICU Interventions  K decreased to 6.1 , cr decreased to 4.5 , lactate slight high     Intervention Category Evaluation Type: New Patient Evaluation  Curtis Hill 11/29/2023, 5:49 AM

## 2023-11-29 NOTE — ED Notes (Signed)
 Pt back from CT. Awakens easily to voice. Denies trouble breathing or issues.   Foley clotted off. Numerous clots removed. Having a hard time getting it to run again. Will give him a break and then keep trying to irrigate.

## 2023-11-29 NOTE — Plan of Care (Addendum)
 Curtis Hill is a 78 y.o. male with medical history significant of prostate cancer s/p EBRT in 2012 last seen in [facility] in [facility], essential hypertension, obstructive sleep apnea on CPAP, hyperlipidemia, iron deficiency anemia, chronic ascending aortic aneurysm, gout, and asthma presented to emergency department for evaluation for blood in the urine, unable to complete bladder.   Patient was recently admitted in August 2005 for severe sepsis in the setting of UTI secondary to Pseudomonas treated treated with IV antibiotic and sent home with Levaquin .   At presentation to emergency department patient found tachycardic, tachypneic and borderline hypotensive.  O2 sat 100% room air. EKG showing sinus tachycardia with irregular rate.   Lab work, normal lactic acid level.  Blood cultures are pending.  CMP showing elevated potassium 6.8, low bicarb 90, elevated blood glucose 135, elevated creatinine 5.14 no albumin  2.2 and GFR 11. CBC showing leukocytosis 26, stable H&H and elevated platelet count 524. Pending UA.   In the setting of significant AKI and hyperkalemia EDPA consulted urology. Urology Dr. Elisabeth performed urethral dilation with diagnostic cystoscopy status post bladder irrigation and Foley placement. 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  Also received 2 L of LR bolus.   Patient's blood pressure is soft initially around 2:30 AM and patient is started on Levophed  drip.   Hospitalist has been consulted for further evaluation management of sepsis in the setting of UTI, hematuria, hyperkalemia and AKI.  Given patient  requiring Levophed  drip he needs to be managed at ICU.

## 2023-11-29 NOTE — Progress Notes (Signed)
 Subjective: Very difficult catheter placement overnight reported obstructed in the emergency department and patient was transferred to ICU with catheter clamped.  Objective: Vital signs in last 24 hours: Temp:  [98.1 F (36.7 C)-99.4 F (37.4 C)] 98.9 F (37.2 C) (08/18 1131) Pulse Rate:  [90-137] 90 (08/18 1241) Resp:  [13-28] 19 (08/18 1241) BP: (77-117)/(38-99) 105/55 (08/18 0919) SpO2:  [88 %-100 %] 95 % (08/18 1241) Arterial Line BP: (93-138)/(50-67) 131/64 (08/18 1241) Weight:  [196.5 kg] 196.5 kg (08/18 0400)  Assessment/Plan: # Hematuria # Presented with cystitis # Morbid obesity  Hand irrigated diluted peroxide this morning with immediate return to flow.    Continue CBI.  Light pink irrigant on slow-medium gtt.  Checked on him multiple times today and hand irrigated again midday for good measure with no return of clot material.  CT A/P notes a moderate amount of clot material.  This may be consolidated and still residing within the bladder.  I was not able to irrigate any clot today.  Emphysematous cystitis present.  Continue broad ABX while awaiting culture data.  External beam radiation and radioactive seed placement 12/11/2010.  Urology will follow  Intake/Output from previous day: 08/17 0701 - 08/18 0700 In: 6656.4 [I.V.:256.4] Out: 6500 [Urine:6500]  Intake/Output this shift: Total I/O In: 1195.9 [I.V.:408; Blood:787.8] Out: 1800 [Urine:1800]  Physical Exam:  General: Alert and oriented CV: No cyanosis Lungs: equal chest rise Abdomen: Soft, NTND, no rebound or guarding Gu: 56f three-way in place with CBI running at low-medium gtt.  Lab Results: Recent Labs    11/29/23 0448 11/29/23 0926 11/29/23 1035  HGB 7.6* 7.1* 8.5*  HCT 25.9* 23.3* 25.0*   BMET Recent Labs    11/28/23 2137 11/29/23 0448 11/29/23 0926 11/29/23 1035  NA 130* 131* 131* 132*  K 6.8* 6.1* 5.6* 5.6*  CL 90* 94* 95*  --   CO2 25 26 25   --   GLUCOSE 135* 129* 127*   --   BUN 117* 108* 103*  --   CREATININE 5.14* 4.54* 3.90*  --   CALCIUM  9.3 8.8* 8.7*  --   HGB 8.6* 7.6* 7.1* 8.5*  WBC 26.0* 25.7*  --   --      Studies/Results: CT Renal Stone Study Result Date: 11/29/2023 CLINICAL DATA:  Abdominal/flank pain, stone suspected EXAM: CT ABDOMEN AND PELVIS WITHOUT CONTRAST TECHNIQUE: Multidetector CT imaging of the abdomen and pelvis was performed following the standard protocol without IV contrast. RADIATION DOSE REDUCTION: This exam was performed according to the departmental dose-optimization program which includes automated exposure control, adjustment of the mA and/or kV according to patient size and/or use of iterative reconstruction technique. COMPARISON:  10/22/2023 FINDINGS: Lower chest: Bronchial wall thickening with centrilobular clustered micro nodules in the right lower lobe compatible with small airway infection/inflammation. Hepatobiliary: Nodular hepatic contour compatible with cirrhosis. Unremarkable gallbladder and biliary tree. Pancreas: Unremarkable. Spleen: Unremarkable. Adrenals/Urinary Tract: Normal adrenal glands. Stable cyst in the posterior right kidney which demonstrates intermediate density. However on 10/22/2023 this was low-density. Findings are compatible with hemorrhagic or proteinaceous transformation of a simple cyst. No follow-up recommended. Mild bilateral hydroureter is new compared to 10/22/2023. Nonobstructing right nephrolithiasis. Extensive gas within the bladder lumen and within the bladder wall. Foley catheter balloon at the bladder neck. Hyperdense debris in the posterior bladder suspicious for hemorrhage. Stomach/Bowel: Stomach is within normal limits. No bowel obstruction or bowel wall thickening. Vascular/Lymphatic: Aortic atherosclerosis. No enlarged abdominal or pelvic lymph nodes. Reproductive: Prostate radiotherapy seeds.  Other: Perivesical stranding. Trace free fluid in the pelvis. No free intraperitoneal air.  Musculoskeletal: No acute fracture. IMPRESSION: Findings concerning for emphysematous/hemorrhagic cystitis. Electronically Signed   By: Norman Gatlin M.D.   On: 11/29/2023 02:03      LOS: 0 days   Ole Bourdon, NP [facility] Pager: [phone]  11/29/2023, 3:17 PM

## 2023-11-29 NOTE — Progress Notes (Incomplete)
 Attending note: I have seen and examined the patient. History, labs and imaging reviewed.  With chronic medical issues, baldder issues Admitted with septic shock secondary to UTI, acute blood loss anemia from hemorrhagic cystitis and hematuruia  Blood pressure (!) 105/55, pulse (!) 105, temperature 99.1 F (37.3 C), temperature source Oral, resp. rate (!) 21, weight (!) 196.5 kg, SpO2 98%. Gen:      No acute distress HEENT:  EOMI, sclera anicteric Neck:     No masses; no thyromegaly Lungs:    Clear to auscultation bilaterally; normal respiratory effort*** CV:         Regular rate and rhythm; no murmurs Abd:      + bowel sounds; soft, non-tender; no palpable masses, no distension Ext:    No edema; adequate peripheral perfusion Neuro: alert and oriented x 3 Psych: normal mood and affect   Labs/Imaging personally reviewed, significant for   Assessment/plan: Sepsis secondary to UTI Hematuria Urology seeing the patient  On CBI. May need to go to OR for cystoscopy On cefepime . Follow cultures Transfuse PRBC Wean down levophed  as tolerated   The patient is critically ill with multiple organ systems failure and requires high complexity decision making for assessment and support, frequent evaluation and titration of therapies, application of advanced monitoring technologies and extensive interpretation of multiple databases.  Critical care time - 35 mins. This represents my time independent of the NPs time taking care of the pt.  Ashton Belote MD [facility] 11/29/2023, 9:48 AM

## 2023-11-29 NOTE — ED Provider Notes (Signed)
 Care of patient assumed from Dr. Veronia.  He arrived in the ED with complaint of hematuria.  He is found to have urine retention, acute renal failure, hyperkalemia.  Urology has been consulted. Physical Exam  BP (!) 117/99 (BP Location: Right Arm)   Pulse (!) 116   Temp 98.8 F (37.1 C) (Oral)   Resp 19   SpO2 97%   Physical Exam Constitutional:      General: He is not in acute distress.    Appearance: He is obese. He is not toxic-appearing.  HENT:     Head: Normocephalic and atraumatic.     Right Ear: External ear normal.     Left Ear: External ear normal.     Nose: Nose normal.     Mouth/Throat:     Mouth: Mucous membranes are moist.  Eyes:     Extraocular Movements: Extraocular movements intact.  Cardiovascular:     Rate and Rhythm: Tachycardia present. Rhythm irregular.  Pulmonary:     Effort: Pulmonary effort is normal.  Skin:    General: Skin is warm and dry.     Coloration: Skin is pale.  Neurological:     General: No focal deficit present.     Mental Status: He is alert and oriented to person, place, and time.  Psychiatric:        Mood and Affect: Mood normal.        Behavior: Behavior normal.     Procedures  Procedures  ED Course / MDM    Medical Decision Making Amount and/or Complexity of Data Reviewed Labs: ordered. Radiology: ordered.  Risk OTC drugs. Prescription drug management. Decision regarding hospitalization.   Urology was able to place 67 French Foley catheter.  Patient has a large bladder clot burden.  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  Urology management complicated by large pannus.  At this time, catheter does appear to be draining.  In consult with urology, patient was covered with cefepime  for likely UTI and concern of sepsis.  Urology recommends gentle CBI.  Patient underwent CT imaging which showed air in bladder and bladder.  There may be secondary to recent instrumentation.  Patient continued to undergo CBI.  Intermittent clotting  would occur but was able to be suctioned out.  He had persistent tachycardia.  He had softening of blood pressures.  He was ultimately started on Levophed .  Critical care evaluated and will admit to ICU.  CRITICAL CARE Performed by: Bernardino Fireman   Total critical care time: 32 minutes  Critical care time was exclusive of separately billable procedures and treating other patients.  Critical care was necessary to treat or prevent imminent or life-threatening deterioration.  Critical care was time spent personally by me on the following activities: development of treatment plan with patient and/or surrogate as well as nursing, discussions with consultants, evaluation of patient's response to treatment, examination of patient, obtaining history from patient or surrogate, ordering and performing treatments and interventions, ordering and review of laboratory studies, ordering and review of radiographic studies, pulse oximetry and re-evaluation of patient's condition.        Fireman Bernardino, MD 11/29/23 [phone]

## 2023-11-29 NOTE — Procedures (Signed)
 Procedure note  Preprocedure diagnosis:  1.  Urinary retention 2.  Gross hematuria 3.  Urethral stricture  Postprocedure diagnosis: 1.  Urinary retention 2.  Gross hematuria 3.  Urethral stricture  Procedure(s): 1.  Urethral dilation with ultraxx 2.  Diagnostic cystoscopy 3.  Difficult foley placement 4.  Bladder irrigation  Surgeon: Valli Shank, MD  Assistants:  None  Anesthesia:  General  Complications:  None  EBL:   Specimens: 1. none  Drains/Catheters: 1.  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 male with history of prostate cancer s/p EBRT in 2012 who was last seen at [facility] in 2020 presented to ED with gross hematuria, AKI, and urinary retention.  Urology was consulted to manage above problems.   Description of procedure:  Patient's penis was retracted into pannus.  Flexible 17 French cystoscope used to visualize urethral meatus.  There was stenosis noted and a 0.38 sensor wire was advanced through the urethral meatus and into the bladder.  Dilation then took place with Amplatz dilators from 10 [place] to 20 [place].  Flexible cystoscopy again took place.  The urethra was noted to be open however the bladder neck appeared to be difficult to visualize due to blood clot.  The wire was then repositioned after the cystoscope was confirmed in the bladder.  An 50 French Foley catheter was made to a council tip using an 18-gauge Angiocath.  It was then advanced over the wire into the bladder.  Bladder irrigation took place however no drainage was noted.  This catheter was removed and several attempts at placing a 20 [place] three-way, 22 [place] three-way, and  a 24 [place] three-way were unsuccessful.  The Ultrex urethral balloon dilator was then used to sequentially dilate from proximal to distal.  It was inflated and kept in place for 2 minutes.  Care was taken not to exceed the burst pressure.  Again attempts at placing the catheter over the wire were unsuccessful.  An 74 [place] three-way was then made into a council tip by using an Angiocath.  It was advanced over the wire into the bladder and inflated with 10 cc sterile water.  Irrigation then took place.  It was unclear whether or not there is a clot in the bladder.  We do not have any abdominal imaging yet.  Hydroperoxide was then used to irrigate the bladder in hopes of disrupting the clot.  This was mixed with saline 1:5.  The patient's urine cleared and CBI was initiated on very slow drip.  The plan will be to admit patient to medicine and make n.p.o. at midnight.  Plan:  Continue foley with CBI slow drip.  CT scan to assess clot burden.  NPO at midnight although unsure whether or not we can take care of this patient with limited equipment.

## 2023-11-29 NOTE — Plan of Care (Signed)
 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   Assessment:  Chronically ill appearing obese adult male, lethargic, arouses with physical stimulation, disoriented to time, situation  Foley Catheter in place with CBI. Urine dark pink, some clots noted  Plan  - Currently on 2 mcg levophed . Titrate for MAP goal >65, limited IV access may need central line, currently with one PIV, >> on ultrasound evaluation patient with calcification to left internal jugular, right internal jugular looks open for placement - Trend H&H q6h, 2 units RBC ordered currently  - Repeat BMP pending  - Continue Cefepime  and follow culture data (patient with hx resistant UTI with proteus, pseudomonas) - Currently NPO. Continue until more awake.   - Will obtain ABG given lethargy, may just be secondary to infectious state but will rule out hypercarbia.   Critical care time: 32 minutes  CRITICAL CARE Performed by: Dennis CHRISTELLA An   Total critical care time: 32 minutes  Critical care time was exclusive of separately billable procedures and treating other patients.  Critical care was necessary to treat or prevent imminent or life-threatening deterioration.  Critical care was time spent personally by me on the following activities: development of treatment plan with patient and/or surrogate as well as nursing, discussions with consultants, evaluation of patient's response to treatment, examination of patient, obtaining history from patient or surrogate, ordering and performing treatments and interventions, ordering and review of laboratory studies, ordering and review of radiographic studies, pulse oximetry and re-evaluation of patient's condition.
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
## 2023-11-29 NOTE — ED Notes (Signed)
 Foley irrigated due to stopped draining. Clots removed. Re started.  Transported to CT with RN.

## 2023-11-30 DIAGNOSIS — A419 Sepsis, unspecified organism: Secondary | ICD-10-CM | POA: Diagnosis not present

## 2023-11-30 DIAGNOSIS — N3091 Cystitis, unspecified with hematuria: Secondary | ICD-10-CM | POA: Diagnosis not present

## 2023-11-30 DIAGNOSIS — Z7189 Other specified counseling: Secondary | ICD-10-CM

## 2023-11-30 DIAGNOSIS — R310 Gross hematuria: Secondary | ICD-10-CM | POA: Diagnosis not present

## 2023-11-30 DIAGNOSIS — R6521 Severe sepsis with septic shock: Secondary | ICD-10-CM | POA: Diagnosis not present

## 2023-11-30 DIAGNOSIS — R339 Retention of urine, unspecified: Secondary | ICD-10-CM

## 2023-11-30 DIAGNOSIS — E876 Hypokalemia: Secondary | ICD-10-CM

## 2023-11-30 DIAGNOSIS — N179 Acute kidney failure, unspecified: Secondary | ICD-10-CM | POA: Diagnosis not present

## 2023-11-30 DIAGNOSIS — Z515 Encounter for palliative care: Secondary | ICD-10-CM

## 2023-11-30 DIAGNOSIS — E871 Hypo-osmolality and hyponatremia: Secondary | ICD-10-CM

## 2023-11-30 LAB — TYPE AND SCREEN
ABO/RH(D): O POS
Antibody Screen: NEGATIVE
Unit division: 0
Unit division: 0

## 2023-11-30 LAB — CBC
HCT: 25.5 % — ABNORMAL LOW (ref 39.0–52.0)
Hemoglobin: 7.9 g/dL — ABNORMAL LOW (ref 13.0–17.0)
MCH: 27.2 pg (ref 26.0–34.0)
MCHC: 31 g/dL (ref 30.0–36.0)
MCV: 87.9 fL (ref 80.0–100.0)
Platelets: 365 K/uL (ref 150–400)
RBC: 2.9 MIL/uL — ABNORMAL LOW (ref 4.22–5.81)
RDW: 16.6 % — ABNORMAL HIGH (ref 11.5–15.5)
WBC: 14.1 K/uL — ABNORMAL HIGH (ref 4.0–10.5)
nRBC: 0 % (ref 0.0–0.2)

## 2023-11-30 LAB — BASIC METABOLIC PANEL WITH GFR
Anion gap: 10 (ref 5–15)
BUN: 80 mg/dL — ABNORMAL HIGH (ref 8–23)
CO2: 27 mmol/L (ref 22–32)
Calcium: 8.8 mg/dL — ABNORMAL LOW (ref 8.9–10.3)
Chloride: 102 mmol/L (ref 98–111)
Creatinine, Ser: 2.6 mg/dL — ABNORMAL HIGH (ref 0.61–1.24)
GFR, Estimated: 25 mL/min — ABNORMAL LOW (ref >60)
Glucose, Bld: 96 mg/dL (ref 70–99)
Potassium: 4.1 mmol/L (ref 3.5–5.1)
Sodium: 139 mmol/L (ref 135–145)

## 2023-11-30 LAB — URINE CULTURE: Culture: NO GROWTH

## 2023-11-30 LAB — BPAM RBC
Blood Product Expiration Date: 202509152359
Blood Product Expiration Date: 202509152359
ISSUE DATE / TIME: 202508180855
ISSUE DATE / TIME: 202508181059
Unit Type and Rh: 5100
Unit Type and Rh: 5100

## 2023-11-30 LAB — HEMOGLOBIN AND HEMATOCRIT, BLOOD
HCT: 25.2 % — ABNORMAL LOW (ref 39.0–52.0)
HCT: 25.9 % — ABNORMAL LOW (ref 39.0–52.0)
HCT: 25.9 % — ABNORMAL LOW (ref 39.0–52.0)
Hemoglobin: 7.7 g/dL — ABNORMAL LOW (ref 13.0–17.0)
Hemoglobin: 7.9 g/dL — ABNORMAL LOW (ref 13.0–17.0)
Hemoglobin: 8 g/dL — ABNORMAL LOW (ref 13.0–17.0)

## 2023-11-30 LAB — PHOSPHORUS: Phosphorus: 4.7 mg/dL — ABNORMAL HIGH (ref 2.5–4.6)

## 2023-11-30 LAB — MAGNESIUM: Magnesium: 1.8 mg/dL (ref 1.7–2.4)

## 2023-11-30 MED ORDER — ATORVASTATIN CALCIUM 10 MG PO TABS
20.0000 mg | ORAL_TABLET | Freq: Every day | ORAL | Status: DC
  Administered 2023-11-30 – 2023-12-10 (×11): 20 mg via ORAL
  Filled 2023-11-30 (×11): qty 2

## 2023-11-30 MED ORDER — ENSURE MAX PROTEIN PO LIQD
11.0000 [oz_av] | Freq: Every day | ORAL | Status: DC
  Administered 2023-11-30 – 2023-12-09 (×6): 11 [oz_av] via ORAL
  Filled 2023-11-30 (×13): qty 330

## 2023-11-30 MED ORDER — HYDROCODONE-ACETAMINOPHEN 5-325 MG PO TABS
1.0000 | ORAL_TABLET | Freq: Four times a day (QID) | ORAL | Status: DC | PRN
  Administered 2023-11-30 – 2023-12-02 (×6): 1 via ORAL
  Filled 2023-11-30 (×6): qty 1

## 2023-11-30 MED ORDER — PROSOURCE PLUS PO LIQD
30.0000 mL | Freq: Two times a day (BID) | ORAL | Status: DC
  Administered 2023-11-30 – 2023-12-09 (×19): 30 mL via ORAL
  Filled 2023-11-30 (×19): qty 30

## 2023-11-30 MED ORDER — ALLOPURINOL 300 MG PO TABS
300.0000 mg | ORAL_TABLET | Freq: Every day | ORAL | Status: DC
  Administered 2023-11-30 – 2023-12-10 (×11): 300 mg via ORAL
  Filled 2023-11-30 (×7): qty 1
  Filled 2023-11-30: qty 3
  Filled 2023-11-30: qty 1
  Filled 2023-11-30: qty 3
  Filled 2023-11-30: qty 1

## 2023-11-30 MED ORDER — SENNOSIDES-DOCUSATE SODIUM 8.6-50 MG PO TABS
1.0000 | ORAL_TABLET | Freq: Two times a day (BID) | ORAL | Status: DC
Start: 2023-11-30 — End: 2023-12-10
  Administered 2023-11-30 – 2023-12-10 (×19): 1 via ORAL
  Filled 2023-11-30 (×21): qty 1

## 2023-11-30 MED ORDER — GABAPENTIN 100 MG PO CAPS
100.0000 mg | ORAL_CAPSULE | Freq: Three times a day (TID) | ORAL | Status: DC
  Administered 2023-11-30 – 2023-12-10 (×31): 100 mg via ORAL
  Filled 2023-11-30 (×31): qty 1

## 2023-11-30 MED ORDER — LACTATED RINGERS IV BOLUS
500.0000 mL | Freq: Once | INTRAVENOUS | Status: AC
Start: 2023-11-30 — End: 2023-11-30
  Administered 2023-11-30: 500 mL via INTRAVENOUS

## 2023-11-30 MED ORDER — GERHARDT'S BUTT CREAM
TOPICAL_CREAM | Freq: Two times a day (BID) | CUTANEOUS | Status: DC
  Administered 2023-12-03: 1 via TOPICAL
  Filled 2023-11-30 (×2): qty 60

## 2023-11-30 MED ORDER — MORPHINE SULFATE (PF) 2 MG/ML IV SOLN
1.0000 mg | Freq: Once | INTRAVENOUS | Status: AC
  Administered 2023-11-30: 1 mg via INTRAVENOUS
  Filled 2023-11-30: qty 1

## 2023-11-30 MED ORDER — MEDIHONEY WOUND/BURN DRESSING EX PSTE
1.0000 | PASTE | Freq: Every day | CUTANEOUS | Status: DC
  Administered 2023-11-30 – 2023-12-10 (×11): 1 via TOPICAL
  Filled 2023-11-30 (×2): qty 44

## 2023-11-30 MED ORDER — JUVEN PO PACK
1.0000 | PACK | Freq: Two times a day (BID) | ORAL | Status: DC
  Administered 2023-11-30 – 2023-12-09 (×17): 1 via ORAL
  Filled 2023-11-30 (×20): qty 1

## 2023-11-30 NOTE — Consult Note (Addendum)
 Consultation Note Date: 11/30/2023   Patient Name: Curtis Hill  DOB: 03/05/1946  MRN: [id number]  Age / Sex: 78 y.o., male  PCP: Okey Carlin Redbird, MD Referring Physician: Mannam, Praveen, MD  Reason for Consultation: Establishing goals of care  HPI/Patient Profile: 78 y.o. male  with past medical history of thoracic aortic ascending aneurysm, CAD, pulmonary hypertension, hyperlipidemia, hypertension, prostate cancer-treated with ERBT 2012, OSA on CPAP, asthma, iron deficiency anemia admitted on 11/28/2023 from [facility] facility with complaints of hematuria.   Patient has had 3 admissions in the past 6 months, most recently from 7/11 to 10/25/2023 for altered mental status secondary to sepsis due to pseudomonal UTI.  He was also hospitalized with sepsis due to panniculitis from 5/24-09/13/23.  In the ED, urology was consulted for difficult Foley placement.  Patient required ureteral dilatation with diagnostic cystoscopy s/p bladder irrigation with Foley placement.  Patient was given cefepime  for concern of UTI, treated for hyperkalemia with dextrose , Lokelma , insulin  and calcium  gluconate, and received a 2 L bolus for hypotension.  He ultimately required ICU admission for initiation of low-dose Levophed  infusion.  PMT has been consulted to assist with goals of care conversation.  Clinical Assessment and Goals of Care:  I have reviewed medical records including EPIC notes, labs and imaging, discussed with RN, assessed the patient and then met at the bedside with patient and his son to discuss diagnosis prognosis, GOC, EOL wishes, disposition and options.  Discussed briefly with urology.  I introduced Palliative Medicine as specialized medical care for people living with serious illness. It focuses on providing relief from the symptoms and stress of a serious illness. The goal is to improve quality of life for both the patient and the family.  We  discussed a brief life review of the patient and then focused on their current illness.   I attempted to elicit values and goals of care important to the patient.    Medical History Review and Understanding:  We discussed patient's acute illness in the context of their chronic comorbidities.  Patient understands his current care plan and likelihood of emphysematous cystitis rather than radiation cystitis.  Social History: Patient has been back and forth from SNF since May.  Prior to this, he was living at home with his wife and son.  He reports spending most of his time in a 4 motor lift chair for sleeping and working at home.  He has lost interest in watching TV lately.  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  He has not been able to work as much lately.  Functional and Nutritional State: Patient reports that prior to May hospitalization, he was using a walker to get to the bathroom and was able to shower himself, only requiring help with putting on his pants due to his pannus size.  Palliative Symptoms: -Right foot pain, burning, rated 20 out of 10 this morning and now 12 out of 10 after as needed morphine  -numbness of the right leg -Also several other symptoms so many that I do not remember them  Discussion: Emotional support and therapeutic listening was provided as patient shared his frustration with the quality of care he has been receiving at SNF.  He tells me of his goal to discharge home instead and wants to find out what support could be covered by Medicare and what additional support they would need to hire out-of-pocket.  He has plans to empty his master bedroom and move  around furniture as well.  Patient hopes to learn more about his right foot pain with an outpatient podiatry visit.  He was hoping to avoid a nerve block procedure, though this has worked in the past.  He is also hoping to avoid retiring, as he has seen from firsthand experience that  people that retire after 45 do not have long and he is definitely not ready for end-of-life.  He does not want to end up like his father.  Concepts specific to code status, artifical feeding and hydration, and rehospitalization were considered and discussed. Counseled patient is on the importance of considering best case and worst-case scenarios, planning for the worst while also hope for the best.  He is currently not open to discussing any limitations to his care or the potential for further complications and prefers to focus on how to get home.    Discussed the importance of continued conversation with family and the medical providers regarding overall plan of care and treatment options, ensuring decisions are within the context of the patient's values and GOCs.   Questions and concerns were addressed.  Hard Choices booklet left for review. The family was encouraged to call with questions or concerns.  PMT will continue to support holistically.    SUMMARY OF RECOMMENDATIONS   - Continue full code/full scope treatment - Patient is not currently open to discussing any boundaries/limitations to his care or the potential for complications/decline.  His goal is to discharge home and to become self-sufficient again - Patient would like to learn what support he would need in the home, either through insurance or private pay - Psychosocial and emotional support provided - PMT will follow peripherally and remains available as needed.  Please do not hesitate to secure chat or call team line should urgent needs arise  Prognosis:  Unable to determine  Discharge Planning: To Be Determined      Primary Diagnoses: Present on Admission:  Hematuria   Physical Exam Vitals and nursing note reviewed.  Constitutional:      General: He is not in acute distress.    Appearance: He is ill-appearing.  HENT:     Head: Normocephalic and atraumatic.  Cardiovascular:     Rate and Rhythm: Normal rate.   Pulmonary:     Effort: Pulmonary effort is normal. No respiratory distress.  Skin:    General: Skin is dry.     Comments: Stasis dermatitis  Neurological:     Mental Status: He is alert and oriented to person, place, and time.  Psychiatric:        Mood and Affect: Mood normal.        Behavior: Behavior normal.     Vital Signs: BP 104/77 (BP Location: Right Arm)   Pulse (!) 106   Temp 98.1 F (36.7 C) (Oral)   Resp 14   Wt (!) 158 kg   SpO2 98%   BMI 54.56 kg/m  Pain Scale: 0-10 POSS *See Group Information*: 1-Acceptable,Awake and alert Pain Score: 0-No pain   SpO2: SpO2: 98 % O2 Device:SpO2: 98 % O2 Flow Rate: .O2 Flow Rate (L/min): 3 L/min   Palliative Assessment/Data: 30%    Total time: I spent 80 minutes in the care of the patient today in the above activities and documenting the encounter.    Meryl Hubers P Rogers Ditter, PA-C  Palliative Medicine Team Team phone # [phone]  Thank you for allowing the Palliative Medicine Team to assist in the care of this patient. Please utilize  secure chat with additional questions, if there is no response within 30 minutes please call the above phone number.  Palliative Medicine Team providers are available by phone from 7am to 7pm daily and can be reached through the team cell phone.  Should this patient require assistance outside of these hours, please call the patient's attending physician.

## 2023-11-30 NOTE — Progress Notes (Signed)
 NAME:  Curtis Hill, MRN:  [id number], DOB:  [birth year], LOS: 1 ADMISSION DATE:  11/28/2023, CONSULTATION DATE:  11/29/23 REFERRING MD:  Melvenia CHIEF COMPLAINT: Hematuria  History of Present Illness:  Patient is a 78 year old male with past medical history of thoracic aortic ascending aneurysm, CAD, pulmonary hypertension, hyperlipidemia, hypertension, prostate cancer-treated with ERBT 2012, OSA on CPAP, asthma, iron deficiency anemia, recent hospitalization from 7/11 to 10/25/2023 for altered mental status secondary to sepsis due to pseudomonal UTI- discharged with levaquin , and also recent hospitalization with sepsis due to panniculitis from 5/24-09/13/23, who presents from [facility] facility with complaints of hematuria.  Patient denied any pain or dysuria but reported that the hematuria started to occur on 8/17 and expresses that he cannot fully empty bladder/urinate.  Upon initial workup in the ED, patient sodium noted to be at 130, potassium 6.8, chloride 90, CO2 25, glucose 135, BUN 117, creatinine 5.14, albumin  2.2, AST 42, ALT 25, GFR 11, WBC 26, hemoglobin 8.6, hematocrit 29.5, platelets 524, lactic acid 1.7.  Urology was consulted due to the fact that nursing staff unable to place Foley catheter.  Ureteral dilatation with diagnostic cystoscopy s/p bladder irrigation with Foley placement.  Patient was given cefepime  for concern of UTI, treated for hyperkalemia with dextrose , Lokelma , insulin  and calcium  gluconate, and received a 2 L bolus for hypotension.  TRH was initially consulted for admission but despite initial fluid resuscitation, patient required initiation of a low-dose Levophed  infusion.  Due to vasopressor requirements, PCCM consulted for ICU admission.   CT renal stone study completed on 8/18- Showing mild bilateral hydro ureter, nonobstructing right nephrolithiasis, hyperdense debris's in the posterior bladder-concern for hemorrhage, Foley catheter in place.  Findings  consistent with emphysematous/hemorrhagic cystitis.  Pertinent  Medical History   Past Medical History:  Diagnosis Date   ABDOMINAL PAIN 05/18/2007   Qualifier: Diagnosis of  By: Henrietta LPN, Regina     ANEMIA, IRON DEFICIENCY 05/18/2007   Qualifier: Diagnosis of  By: Henrietta LPN, Regina     Aortic stenosis 07/19/2018   Mild by echo 05/2018   Arthritis    Back pain    Benign neoplasm of colon 03/28/2012   Bilateral swelling of feet and ankles    Cancer (HCC)    prostate cancer   Chronic back pain    Coronary artery calcification seen on CAT scan 03/17/2018   EPIGASTRIC PAIN 05/18/2007   Qualifier: Diagnosis of  By: Henrietta LPN, Regina     Fatty liver    GERD (gastroesophageal reflux disease)    HIATAL HERNIA 05/18/2007   Qualifier: Diagnosis of  By: Henrietta LPN, Regina     History of prostate cancer    Hyperlipidemia    Hypertension    Joint pain    LEUKOCYTOSIS 05/18/2007   Qualifier: Diagnosis of  By: Henrietta LPN, Regina     LIVER FUNCTION TESTS, ABNORMAL 05/18/2007   Qualifier: Diagnosis of  By: Henrietta LPN, Regina     MORBID OBESITY 05/18/2007   Qualifier: Diagnosis of  By: Henrietta LPN, Regina     Panniculitis    Pulmonary HTN (HCC) 11/02/2018   Shortness of breath on exertion    SINUSITIS, ACUTE 05/18/2007   Qualifier: Diagnosis of  By: Henrietta LPN, Regina     Skin rash    Sleep apnea    Thoracic aortic aneurysm (HCC) 03/17/2018   45mm by echo 05/2018 and 43mm by chest CTA 03/2018   THROMBOCYTOSIS 05/18/2007   Qualifier: Diagnosis of  By:  Laws LPN, [facility] Events: Including procedures, antibiotic start and stop dates in addition to other pertinent events   PCCM admit for severe sepsis/shock and hematuria, on 2mcg of levophed    Interim History / Subjective:  +100 ml, net + 260 ml I/O. Afebrile Levo weaned off yesterday  Overall, patient states he feels better than compared to yesterday.  Objective    Blood pressure 112/81, pulse (!) 104, temperature 98.1 F (36.7 C),  temperature source Oral, resp. rate 20, weight (!) 158 kg, SpO2 93%.        Intake/Output Summary (Last 24 hours) at 11/30/2023 0909 Last data filed at 11/30/2023 0530 Gross per 24 hour  Intake 23379.52 ml  Output 76724 ml  Net 104.52 ml   Filed Weights   11/29/23 0400 11/30/23 0550  Weight: (!) 196.5 kg (!) 158 kg    Examination: General: chronically ill adult male.  HEENT: Normocephalic,  Pink MM CV: s1,s2, RRR-sinus tach with PACs, no MRG, No JVD  pulm: clear, diminished, no distress on Homer Abs: bs active, soft, distention/obese, large panus  Extremities: trace lower extremity edema, warm and well perfused. Skin: no rash  Neuro: Alert and oriented, follows commands without focal deficit GU: foley intact with continuous bladder irrigation.  Light blood tinged urine, no clots.   Resolved problem list   Assessment and Plan  Severe sepsis with developing shock secondary to UTI with hemorrhagic component Hematuria Leukocytosis  Hx of Prostate Cancer- treated with ERBT (2012)  Treated for urosepsis secondary to pseudomonal UTI-7/11 to 10/25/2023-sensitive to cefepime  -Urology consulted due to urinary retention/gross hematuria/urethral stricture-patient underwent ureteral dilatation/diagnostic cystoscopy with Foley placement as well as bladder irrigation-estimated blood loss was at least 300 mL during procedure.  Findings consistent with large pannus causing retraction of penis, urethral meatal stenosis, urethral stricture.  Patient placed on continuous bladder irrigation.  CT renal stone study completed on 8/18: Showing mild bilateral hydro ureter, nonobstructing right nephrolithiasis, hyperdense debris's in the posterior bladder-concern for hemorrhage, Foley catheter in place.  Findings consistent with emphysematous/hemorrhagic cystitis. Lactic acid 1.7, patient has received 2 L of LR Required 2 units of PRBC 8/18 P: Levophed  weaned off 8/18 Urine culture pending,  continue  cefepime  Continue CBI per urology's recommendations, appreciate urology's assistance  AKI, improving Hyperkalemia, resolved Cr 5.14, BUN 117, K 6.8 on presentation P: Kidney function improving with foley placement/CBI Creatinine down to 2.6 this am.   Chronic respiratory insufficiency, on 3 L Morris baseline OSA-on CPAP Pulmonary hypertension Asthma P: CPAP at night Albuterol  as needed At baseline, but continue to wean as tolerated  Thoracic aortic aneurysm-CT scan on 06/22/22 showed unchanged aneurysmal dilatation of the ascending aorta, 4.1 cm  CAD Echo completed on 711/25-EF 60 to 65%, LV had normal function, unable to evaluate regional wall abnormalities, severe ventricular hypertrophy, grade 1 diastolic dysfunction, RV mildly enlarged, LA moderately dilated P: Will need annual evaluation of aortic aneurysm/outpatient  Hypertension Hyperlipidemia P: Hold antihypertensives, recently on vasopressors Hold statin since patient is n.p.o. at this time  Acute on chronic anemia in setting of hematuria IDA P: Trend H&H serially every 6 hours Monitor for significant hematuria, continue CBI per urology  Morbid obesity BMI 67.85 P: Education of diet/exercise when medically appropriate Will need RD consult to assess nutritional status Keep n.p.o. for now per urology  GERD P: Protonix  40mg  IV   GOC:  On 8/18 code status was discussed- patient remaining full code but does want  to think about goals of care if were to decompensate.  Palliative care was consulted for ongoing goals of care discussions  Deconditioned state Minimal mobility in the facility he has been in. PT/OT consult ordered.   Best Practice (right click and Reselect all SmartList Selections daily)   Diet/type: NPO Bedside swallow completed and start diet  DVT prophylaxis SCD in setting of hematuria  Pressure ulcer(s): N/A GI prophylaxis: PPI > discontinue today  Lines: N/A Foley:  N/A Continuous bladder  irrigation  Code Status:  full code Last date of multidisciplinary goals of care discussion [8/18] No ICU needs, will transfer care to hospitalist team.  RN to determine if patient able to transfer out of ICU with continuous bladder irrigation.   Labs   CBC: Recent Labs  Lab 11/28/23 2137 11/29/23 0448 11/29/23 0926 11/29/23 1035 11/29/23 1828 11/29/23 2039 11/30/23 0005 11/30/23 0339  WBC 26.0* 25.7*  --   --   --   --   --  14.1*  NEUTROABS 23.6*  --   --   --   --   --   --   --   HGB 8.6* 7.6*   < > 8.5* 8.2* 9.1* 8.0* 7.9*  HCT 29.5* 25.9*   < > 25.0* 26.6* 29.6* 25.9* 25.5*  MCV 89.9 89.6  --   --   --   --   --  87.9  PLT 524* 466*  --   --   --   --   --  365   < > = values in this interval not displayed.    Basic Metabolic Panel: Recent Labs  Lab 11/29/23 0448 11/29/23 0926 11/29/23 1035 11/29/23 1828 11/29/23 2039 11/30/23 0339  NA 131* 131* 132* 137 137 139  K 6.1* 5.6* 5.6* 4.7 5.1 4.1  CL 94* 95*  --  98 99 102  CO2 26 25  --  28 28 27   GLUCOSE 129* 127*  --  108* 102* 96  BUN 108* 103*  --  94* 92* 80*  CREATININE 4.54* 3.90*  --  3.18* 3.14* 2.60*  CALCIUM  8.8* 8.7*  --  8.8* 8.9 8.8*  MG 2.0  --   --   --   --  1.8  PHOS 5.2* 5.0*  --   --   --  4.7*   GFR: Estimated Creatinine Clearance: 34.6 mL/min (A) (by C-G formula based on SCr of 2.6 mg/dL (H)). Recent Labs  Lab 11/28/23 2137 11/29/23 0036 11/29/23 0448 11/29/23 0926 11/30/23 0339  WBC 26.0*  --  25.7*  --  14.1*  LATICACIDVEN  --  1.7 2.1* 1.0  --     Liver Function Tests: Recent Labs  Lab 11/28/23 2137 11/29/23 0448  AST 42* 35  ALT 25 24  ALKPHOS 125 122  BILITOT 0.5 0.5  PROT 8.9* 7.8  ALBUMIN  2.2* 2.0*   No results for input(s): LIPASE, AMYLASE in the last 168 hours. No results for input(s): AMMONIA in the last 168 hours.  ABG    Component Value Date/Time   PHART 7.415 11/29/2023 1035   PCO2ART 47.4 11/29/2023 1035   PO2ART 90 11/29/2023 1035   HCO3 30.3  (H) 11/29/2023 1035   TCO2 32 11/29/2023 1035   O2SAT 97 11/29/2023 1035     Coagulation Profile: Recent Labs  Lab 11/29/23 0448  INR 1.1    Cardiac Enzymes: No results for input(s): CKTOTAL, CKMB, CKMBINDEX, TROPONINI in the last 168 hours.  HbA1C: Hgb A1c  MFr Bld  Date/Time Value Ref Range Status  12/04/2020 02:45 PM 5.6 4.8 - 5.6 % Final    Comment:             Prediabetes: 5.7 - 6.4          Diabetes: >6.4          Glycemic control for adults with diabetes: <7.0   05/23/2020 11:07 AM 5.6 4.8 - 5.6 % Final    Comment:             Prediabetes: 5.7 - 6.4          Diabetes: >6.4          Glycemic control for adults with diabetes: <7.0     CBG: Recent Labs  Lab 11/28/23 2236 11/29/23 0508  GLUCAP 120* 123*   Home Medications  Prior to Admission medications   Medication Sig Start Date End Date Taking? Authorizing Provider  albuterol  (VENTOLIN  HFA) 108 (90 Base) MCG/ACT inhaler Inhale 2 puffs into the lungs every 6 (six) hours as needed for wheezing or shortness of breath.    [provider]  allopurinol  (ZYLOPRIM ) 300 MG tablet Take 300 mg by mouth daily.    [provider]  ammonium lactate (LAC-HYDRIN) 12 % lotion Apply 1 Application topically 2 (two) times daily as needed for dry skin. Apply to bilateral legs and feet two times a day for dry, irritated skin. Rub in well.    [provider]  aspirin  EC 81 MG tablet Take 81 mg by mouth daily. Swallow whole.    [provider]  atorvastatin  (LIPITOR) 20 MG tablet Take 20 mg by mouth every evening.    [provider]  calcium  carbonate (TUMS - DOSED IN MG ELEMENTAL CALCIUM ) 500 MG chewable tablet Chew 1 tablet by mouth 2 (two) times daily. Give for calcium  supplementation    [provider]  cholecalciferol (VITAMIN D3) 25 MCG (1000 UNIT) tablet Take 1,000 Units by mouth daily.    [provider]  Coenzyme Q10 (COQ10) 200 MG CAPS Take 1 capsule by  mouth in the morning.    [provider]  collagenase  (SANTYL ) 250 UNIT/GM ointment Apply topically daily. 10/25/23   Lue Elsie BROCKS, MD  cyanocobalamin (VITAMIN B12) 500 MCG tablet Take 1,000 mcg by mouth daily. 09/09/17   [provider]  furosemide  (LASIX ) 40 MG tablet Take 40 mg by mouth daily as needed for edema or fluid.    [provider]  gabapentin  (NEURONTIN ) 300 MG capsule Take 300 mg by mouth 3 (three) times daily. Give one capsule three times daily for nerve pain .    [provider]  Magnesium  200 MG TABS Take 200 mg by mouth daily at 12 noon.    [provider]  Multiple Vitamins-Minerals (CENTRUM SILVER 50+MEN) TABS Take 1 tablet by mouth in the morning.    [provider]  Nystatin  (GERHARDT'S BUTT CREAM) CREA Apply 1 Application topically 3 (three) times daily. 10/25/23   Lue Elsie BROCKS, MD  nystatin  (MYCOSTATIN /NYSTOP ) powder Apply topically 3 (three) times daily. Patient taking differently: Apply 1 Application topically 3 (three) times daily. Apply to groin topically three times daily. 09/13/23   Wouk, Devaughn Sayres, MD  Propyl Glycol-Hydroxyethylcell (NASAL MOIST) GEL Place 1 Units into both nostrils daily. Give for dryness    [provider]  Semaglutide-Weight Management 0.25 MG/0.5ML SOAJ Inject 0.25 mg into the skin once a week. Inject every Wednesday. 10/13/23   [provider]  simethicone  (MYLICON) 80 MG chewable tablet Chew 160 mg by mouth 3 (three) times daily after meals. Give for GI distress    [provider]  traMADol  (ULTRAM ) 50 MG tablet Take 1 tablet (50 mg total) by mouth 2 (two) times daily as needed for moderate pain (pain score 4-6) or severe pain (pain score 7-10). Patient taking differently: Take 50 mg by mouth every 6 (six) hours as needed for moderate pain (pain score 4-6) or severe pain (pain score 7-10). 09/13/23   Wouk, Devaughn Sayres, MD         [facility] 11/30/2023, 9:09 AM  Please see [email or web address] for pager details.  From 7A-7P if no response, please call [phone]. After hours, please call ELink [phone].

## 2023-11-30 NOTE — Progress Notes (Signed)
 Subjective: Patient looks much better this morning.  Nursing reports clot obstruction a couple of times last night but was easily cleared.  Hematuria is nearly resolved today.  Blood-tinged irrigant is on the low gtt.  Clamped on rounds.  Objective: Vital signs in last 24 hours: Temp:  [97.5 F (36.4 C)-98.7 F (37.1 C)] 97.9 F (36.6 C) (08/19 1114) Pulse Rate:  [96-132] 99 (08/19 1200) Resp:  [10-26] 14 (08/19 1200) BP: (94-142)/(70-106) 94/72 (08/19 1200) SpO2:  [93 %-100 %] 98 % (08/19 1200) Arterial Line BP: (85-128)/(49-77) 120/64 (08/19 0800) Weight:  [841 kg] 158 kg (08/19 0550)  Assessment/Plan: # Hematuria # Emphysematous cystitis # Morbid obesity  CBI clamped on the morning of 11/30/2023.  Patient is showing consistent improvement.  Hematuria appears to be resolved today.  He reports he has had no problem with urinary retention prior.  Will consider voiding trial during this admission.  External beam radiation and radioactive seed placement 12/11/2010.  Urology will follow  Intake/Output from previous day: 08/18 0701 - 08/19 0700 In: 23379.5 [I.V.:2391; Blood:787.8; IV Piggyback:100.7] Out: 76724 [Urine:8625]  Intake/Output this shift: Total I/O In: 6494.2 [Other:6000; IV Piggyback:494.2] Out: 1150 [Urine:1150]  Physical Exam:  General: Alert and oriented CV: No cyanosis Lungs: equal chest rise Abdomen: Soft, NTND, no rebound or guarding Gu: 41f three-way in place with CBI clamped  Lab Results: Recent Labs    11/30/23 0005 11/30/23 0339 11/30/23 0920  HGB 8.0* 7.9* 7.7*  HCT 25.9* 25.5* 25.2*   BMET Recent Labs    11/29/23 0448 11/29/23 0926 11/29/23 2039 11/30/23 0005 11/30/23 0339 11/30/23 0920  NA 131*   < > 137  --  139  --   K 6.1*   < > 5.1  --  4.1  --   CL 94*   < > 99  --  102  --   CO2 26   < > 28  --  27  --   GLUCOSE 129*   < > 102*  --  96  --   BUN 108*   < > 92*  --  80*  --   CREATININE 4.54*   < > 3.14*  --  2.60*   --   CALCIUM  8.8*   < > 8.9  --  8.8*  --   HGB 7.6*   < > 9.1*   < > 7.9* 7.7*  WBC 25.7*  --   --   --  14.1*  --    < > = values in this interval not displayed.     Studies/Results: CT Renal Stone Study Result Date: 11/29/2023 CLINICAL DATA:  Abdominal/flank pain, stone suspected EXAM: CT ABDOMEN AND PELVIS WITHOUT CONTRAST TECHNIQUE: Multidetector CT imaging of the abdomen and pelvis was performed following the standard protocol without IV contrast. RADIATION DOSE REDUCTION: This exam was performed according to the departmental dose-optimization program which includes automated exposure control, adjustment of the mA and/or kV according to patient size and/or use of iterative reconstruction technique. COMPARISON:  10/22/2023 FINDINGS: Lower chest: Bronchial wall thickening with centrilobular clustered micro nodules in the right lower lobe compatible with small airway infection/inflammation. Hepatobiliary: Nodular hepatic contour compatible with cirrhosis. Unremarkable gallbladder and biliary tree. Pancreas: Unremarkable. Spleen: Unremarkable. Adrenals/Urinary Tract: Normal adrenal glands. Stable cyst in the posterior right kidney which demonstrates intermediate density. However on 10/22/2023 this was low-density. Findings are compatible with hemorrhagic or proteinaceous transformation of a simple cyst. No follow-up recommended. Mild bilateral hydroureter is  new compared to 10/22/2023. Nonobstructing right nephrolithiasis. Extensive gas within the bladder lumen and within the bladder wall. Foley catheter balloon at the bladder neck. Hyperdense debris in the posterior bladder suspicious for hemorrhage. Stomach/Bowel: Stomach is within normal limits. No bowel obstruction or bowel wall thickening. Vascular/Lymphatic: Aortic atherosclerosis. No enlarged abdominal or pelvic lymph nodes. Reproductive: Prostate radiotherapy seeds. Other: Perivesical stranding. Trace free fluid in the pelvis. No free  intraperitoneal air. Musculoskeletal: No acute fracture. IMPRESSION: Findings concerning for emphysematous/hemorrhagic cystitis. Electronically Signed   By: Norman Gatlin M.D.   On: 11/29/2023 02:03      LOS: 1 day   Ole Bourdon, NP [facility] Pager: [phone]  11/30/2023, 2:00 PM

## 2023-11-30 NOTE — Evaluation (Signed)
 Occupational Therapy Evaluation Patient Details Name: Curtis Hill MRN: [id number] DOB: 1945-06-30 Today's Date: 11/30/2023   History of Present Illness   78 year old man male with admittied from SNF with gross hematuria, urinary retention and AKI.  He has had 3 admissions in the past 6 months, most recently from 7/11 to 10/25/2023 for altered mental status secondary to sepsis due to pseudomonal UTI.  He was also hospitalized with sepsis due to panniculitis from 5/24-6/2/25Pt with history of prostate cancer, thoracic aortic ascending aneurysm, CAD, pulmonary hypertension, hyperlipidemia, hypertension, asthma, iron deficiency, and morbid obesity, right foot pain.     Clinical Impressions Pt currently at total +2 assist level for bed mobility and selfcare tasks sit to supine simulated.  Unable to complete standing from elevated EOB with use of the RW secondary to weakness and report of right foot pain.  HR around 112 BPM in supine increasing up to the 130's with attempt to stand with with sitting EOB. Oxygen sats 96% on 1-2 Ls nasal cannula at rest, decreasing down to 88% on room air with activity.  BP in sitting around 142/90 range.  Prior to admission pt was at [facility], requiring assist for all selfcare and mobility attempts.  Currently, feel based on limitations in ADL independence, mobility, and transfers that he will benefit from acute care OT to help progress toward a more independent level and reduce burden of care.  Recommend continued inpatient follow up therapy, <3 hours/day post acute stay to continue progression.         If plan is discharge home, recommend the following:   Two people to help with walking and/or transfers;Two people to help with bathing/dressing/bathroom;Help with stairs or ramp for entrance;Assist for transportation     Functional Status Assessment   Patient has had a recent decline in their functional status and demonstrates the ability to make  significant improvements in function in a reasonable and predictable amount of time.     Equipment Recommendations   Other (comment) (TBD next venue of care)      Precautions/Restrictions   Precautions Precautions: Fall Precaution/Restrictions Comments: panus, painful right foot with weightbearing Restrictions Weight Bearing Restrictions Per Provider Order: No     Mobility Bed Mobility Overal bed mobility: Needs Assistance Bed Mobility: Sit to Supine, Supine to Sit     Supine to sit: +2 for physical assistance, HOB elevated, +2 for safety/equipment, Max assist Sit to supine: +2 for safety/equipment, +2 for physical assistance, Max assist   General bed mobility comments: Pt needs assist with trunk and LEs for supine to sit and sit to supine but is able to provide some slight assist.    Transfers Overall transfer level: Needs assistance                 General transfer comment: Unable to complete sit to stand from the EOB with attempts of +2 and use of bariatric RW.      Balance Overall balance assessment: Needs assistance Sitting-balance support: Bilateral upper extremity supported, Feet supported Sitting balance-Leahy Scale: Poor Sitting balance - Comments: Increased posterior lean at times, requiring UE support with patient fatiguing easily.                                   ADL either performed or assessed with clinical judgement   ADL Overall ADL's : Needs assistance/impaired Eating/Feeding: Independent;Bed level   Grooming: Wash/dry hands;Wash/dry face;Set up;Contact  guard assist Grooming Details (indicate cue type and reason): sitting EOB Upper Body Bathing: Minimal assistance;Sitting Upper Body Bathing Details (indicate cue type and reason): simulated EOB Lower Body Bathing: Total assistance;+2 for physical assistance;+2 for safety/equipment;Bed level;Sitting/lateral leans   Upper Body Dressing : Minimal assistance;Sitting Upper  Body Dressing Details (indicate cue type and reason): EOB Lower Body Dressing: +2 for safety/equipment;+2 for physical assistance;Total assistance;Bed level               Functional mobility during ADLs: +2 for safety/equipment;+2 for physical assistance;Total assistance (supine to sit EOB) General ADL Comments: Pt needed total +2 for supine to sit EOB with increased posterior bias at times secondary to body habitus.  He was able to use UEs for support and maintain static sitting balance at min assist to min guard.  Oxygen sats at 95% or better on 1-2Ls nasal cannula in sitting with HR elevating from 112 BPM in bed up to over 132 while sitting and with attempting standing.  Pt unable to lift buttocks off of the bed with total +2 and use of bariatric RW.  Pt reports history of right foot pain as well with weightbearing with plans to see a podiatrist soon.  With O2 removed oxygen decreased down to 89% on room air with bed mobility, and rolling to reposition pads.     Vision Baseline Vision/History: 0 No visual deficits Ability to See in Adequate Light: 0 Adequate Patient Visual Report: No change from baseline Vision Assessment?: No apparent visual deficits     Perception Perception: Within Functional Limits       Praxis Praxis: WFL       Pertinent Vitals/Pain Pain Assessment Pain Assessment: Faces Faces Pain Scale: Hurts a little bit Pain Location: right foot Pain Descriptors / Indicators: Discomfort Pain Intervention(s): Monitored during session, Repositioned, Limited activity within patient's tolerance     Extremity/Trunk Assessment Upper Extremity Assessment Upper Extremity Assessment: Overall WFL for tasks assessed (strength 4/5 in BUEs with gross assessment)   Lower Extremity Assessment Lower Extremity Assessment: Defer to PT evaluation   Cervical / Trunk Assessment Cervical / Trunk Assessment: Other exceptions Cervical / Trunk Exceptions: panus, morbid obesity    Communication Communication Communication: No apparent difficulties   Cognition Arousal: Alert Behavior During Therapy: WFL for tasks assessed/performed Cognition: No apparent impairments                               Following commands: Intact       Cueing  General Comments   Cueing Techniques: Verbal cues              Home Living Family/patient expects to be discharged to:: Skilled nursing facility                                        Prior Functioning/Environment               Mobility Comments: Pt reports being able to stand with RW and take a few steps prior to admission since being in rehab from previous admission, but recently, has not been able to stand. ADLs Comments: Min assist to supervision for UB selfcare with total assist for LB selfcare bed level.    OT Problem List: Decreased strength;Decreased knowledge of use of DME or AE;Obesity;Decreased activity tolerance;Impaired balance (sitting and/or standing);Pain;Cardiopulmonary status limiting activity  OT Treatment/Interventions: Self-care/ADL training;Patient/family education;Therapeutic exercise;Balance training;Therapeutic activities;DME and/or AE instruction      OT Goals(Current goals can be found in the care plan section)   Acute Rehab OT Goals Patient Stated Goal: Pt wants to get better per his report and stand/walk so he can go home. OT Goal Formulation: With patient Time For Goal Achievement: 12/14/23 Potential to Achieve Goals: Fair   OT Frequency:  Min 1X/week    Co-evaluation PT/OT/SLP Co-Evaluation/Treatment: Yes Reason for Co-Treatment: Complexity of the patient's impairments (multi-system involvement);For patient/therapist safety;To address functional/ADL transfers   OT goals addressed during session: ADL's and self-care      AM-PAC OT 6 Clicks Daily Activity     Outcome Measure Help from another person eating meals?: None Help from  another person taking care of personal grooming?: A Little Help from another person toileting, which includes using toliet, bedpan, or urinal?: Total Help from another person bathing (including washing, rinsing, drying)?: Total Help from another person to put on and taking off regular upper body clothing?: A Little Help from another person to put on and taking off regular lower body clothing?: Total 6 Click Score: 13   End of Session Equipment Utilized During Treatment: Gait belt;Rolling walker (2 wheels) Nurse Communication: Mobility status  Activity Tolerance: Patient limited by pain Patient left: in bed;with call bell/phone within reach;with nursing/sitter in room  OT Visit Diagnosis: Muscle weakness (generalized) (M62.81);Pain;Unsteadiness on feet (R26.81);Other abnormalities of gait and mobility (R26.89);Dizziness and giddiness (R42) Pain - Right/Left: Right Pain - part of body: Ankle and joints of foot                Time: 1445-1530 OT Time Calculation (min): 45 min Charges:  OT General Charges $OT Visit: 1 Visit OT Evaluation $OT Eval High Complexity: 1 High  Lynwood Constant, OTR/L Acute Rehabilitation Services  Office [phone] 11/30/2023

## 2023-11-30 NOTE — Plan of Care (Signed)

## 2023-11-30 NOTE — Plan of Care (Signed)
  Problem: Education: Goal: Knowledge of General Education information will improve Description: Including pain rating scale, medication(s)/side effects and non-pharmacologic comfort measures Outcome: Progressing   Problem: Clinical Measurements: Goal: Ability to maintain clinical measurements within normal limits will improve Outcome: Progressing   Problem: Clinical Measurements: Goal: Will remain free from infection Outcome: Progressing   Problem: Clinical Measurements: Goal: Diagnostic test results will improve Outcome: Progressing   Problem: Clinical Measurements: Goal: Respiratory complications will improve Outcome: Progressing   Problem: Clinical Measurements: Goal: Cardiovascular complication will be avoided Outcome: Progressing   Problem: Nutrition: Goal: Adequate nutrition will be maintained Outcome: Progressing   Problem: Coping: Goal: Level of anxiety will decrease Outcome: Progressing   Problem: Safety: Goal: Ability to remain free from injury will improve Outcome: Progressing   Problem: Pain Managment: Goal: General experience of comfort will improve and/or be controlled Outcome: Progressing   Problem: Skin Integrity: Goal: Risk for impaired skin integrity will decrease Outcome: Progressing   Plan of care, assessment, treatment, monitoring, and intervention (s) ongoing, see MAR see flowsheet

## 2023-11-30 NOTE — Consult Note (Addendum)
 WOC Nurse Consult Note: this patient is known to WOC team from previous admission with Stage 3 pressure injury sacrum and Intertriginous dermatitis underneath pannus  Reason for Consult: wound in abdominal fold  Wound type: 1.  Full thickness wound R pannus/abdominal fold  2.  Stage 3 Pressure Injury Sacrum  3. Intertriginous dermatitis pannus  ICD-10 CM Codes for Irritant Dermatitis  L30.4  - Erythema intertrigo. Also used for abrasion of the hand, chafing of the skin, dermatitis due to sweating and friction, friction dermatitis, friction eczema, and genital/thigh intertrigo.  Pressure Injury POA: Yes sacrum  Measurement: see nursing flowsheet  Wound bed: pannus wound red moist; sacrum 50% red 50% yellow Drainage (amount, consistency, odor) see nursing flowsheet  Periwound: erythema  Dressing procedure/placement/frequency:  Cleanse sacral wound with NS, apply Medihoney to wound bed daily, cover with dry gauze and secure with silicone foam.   Cleanse full thickness wound R abdominal fold with NS, apply silver hydrofiber ([employer]qu[employer]el [employer] Gerlean #[id number]) to wound bed daily and as needed to contain drainage, cover with ABD pad.  Cleanse underneath pannus with soap and water, dry and apply Order Gerlean # [phone] as follows:  Measure and cut length of InterDry to fit in skin folds that have skin breakdown Tuck InterDry fabric into skin folds in a single layer, allow for 2 inches of overhang from skin edges to allow for wicking to occur May remove to bathe; dry area thoroughly and then tuck into affected areas again Do not apply any creams or ointments when using InterDry DO NOT THROW AWAY FOR 5 DAYS unless soiled with stool DO NOT [facility] product, this will inactivate the silver in the material  New sheet of Interdry should be applied after 5 days of use if patient continues to have skin breakdown    POC discussed with bedside nurse.WOC team will not follow. Re-consult if further needs arise.   Thank  you,    Powell Bar MSN, RN-BC, [employer]

## 2023-11-30 NOTE — Evaluation (Signed)
 Physical Therapy Evaluation Patient Details Name: Curtis Hill MRN: [id number] DOB: 06-30-1945 Today's Date: 11/30/2023  History of Present Illness  78 year old man male with admittied from SNF with gross hematuria, urinary retention and AKI.  He has had 3 admissions in the past 6 months, most recently from 7/11 to 10/25/2023 for altered mental status secondary to sepsis due to pseudomonal UTI.  He was also hospitalized with sepsis due to panniculitis from 5/24-6/2/25Pt with history of prostate cancer, thoracic aortic ascending aneurysm, CAD, pulmonary hypertension, hyperlipidemia, hypertension, asthma, iron deficiency, and morbid obesity, right foot pain.  Clinical Impression  Patient presents with decreased mobility due to limited activity tolerance, up to 136 HR with mobility at EOB and attempt to stand.  Patient unable to stand due to R foot pain which he reports is ongoing.  Patient eager for mobility per his report to go home, but hopes a podiatrist can help him get pain improved on his foot.  Patient will benefit from skilled PT in the acute setting and from return to post-acute inpatient rehab at d/c.        If plan is discharge home, recommend the following: Two people to help with bathing/dressing/bathroom;Two people to help with walking and/or transfers   Can travel by private vehicle   No    Equipment Recommendations None recommended by PT  Recommendations for Other Services       Functional Status Assessment Patient has had a recent decline in their functional status and/or demonstrates limited ability to make significant improvements in function in a reasonable and predictable amount of time     Precautions / Restrictions Precautions Precautions: Fall Precaution/Restrictions Comments: panus, painful right foot with weightbearing; watch HR; continuous bladder irrigation      Mobility  Bed Mobility Overal bed mobility: Needs Assistance Bed Mobility: Sit to Supine,  Supine to Sit     Supine to sit: +2 for physical assistance, HOB elevated, +2 for safety/equipment, Max assist Sit to supine: +2 for safety/equipment, +2 for physical assistance, Max assist   General bed mobility comments: Pt needs assist with trunk and LEs for supine to sit and sit to supine but is able to provide some slight assist.    Transfers Overall transfer level: Needs assistance                 General transfer comment: Unable to complete sit to stand from the EOB with attempts of +2 and use of bariatric RW.    Ambulation/Gait                  Stairs            Wheelchair Mobility     Tilt Bed    Modified Rankin (Stroke Patients Only)       Balance Overall balance assessment: Needs assistance Sitting-balance support: Bilateral upper extremity supported Sitting balance-Leahy Scale: Poor Sitting balance - Comments: Increased posterior lean at times, requiring UE support with patient fatiguing easily.                                     Pertinent Vitals/Pain Pain Assessment Pain Assessment: Faces Faces Pain Scale: Hurts a little bit Pain Location: right foot Pain Descriptors / Indicators: Discomfort Pain Intervention(s): Monitored during session, Limited activity within patient's tolerance    Home Living Family/patient expects to be discharged to:: Skilled nursing facility  Prior Function               Mobility Comments: Pt reports being able to stand with RW and take a few steps prior to admission since being in rehab from previous admission, but recently, has not been able to stand. ADLs Comments: Min assist to supervision for UB selfcare with total assist for LB selfcare bed level.     Extremity/Trunk Assessment   Upper Extremity Assessment Upper Extremity Assessment: Defer to OT evaluation    Lower Extremity Assessment Lower Extremity Assessment: RLE deficits/detail;LLE  deficits/detail RLE Deficits / Details: lifts antigravity, noted limited by pannus; noted localized area of edema and errythema on medial aspect dorsum of foot. tender to touch LLE Deficits / Details: lifts antigravity, noted limited by pannus, though lifts more easily than R    Cervical / Trunk Assessment Cervical / Trunk Assessment: Other exceptions Cervical / Trunk Exceptions: panus, morbid obesity  Communication   Communication Communication: No apparent difficulties    Cognition Arousal: Alert Behavior During Therapy: WFL for tasks assessed/performed                             Following commands: Intact       Cueing Cueing Techniques: Verbal cues     General Comments General comments (skin integrity, edema, etc.): HR into 130's with mobility; RN aware; assisted to move pt to bariatric specialty bed    Exercises     Assessment/Plan    PT Assessment Patient needs continued PT services  PT Problem List         PT Treatment Interventions DME instruction;Patient/family education;Functional mobility training;Wheelchair mobility training;Therapeutic exercise;Therapeutic activities;Balance training    PT Goals (Current goals can be found in the Care Plan section)  Acute Rehab PT Goals Patient Stated Goal: to walk out of here PT Goal Formulation: With patient Time For Goal Achievement: 12/14/23 Potential to Achieve Goals: Good    Frequency Min 2X/week     Co-evaluation PT/OT/SLP Co-Evaluation/Treatment: Yes Reason for Co-Treatment: Complexity of the patient's impairments (multi-system involvement);For patient/therapist safety;To address functional/ADL transfers PT goals addressed during session: Mobility/safety with mobility;Proper use of DME OT goals addressed during session: ADL's and self-care       AM-PAC PT 6 Clicks Mobility  Outcome Measure Help needed turning from your back to your side while in a flat bed without using bedrails?:  Total Help needed moving from lying on your back to sitting on the side of a flat bed without using bedrails?: Total Help needed moving to and from a bed to a chair (including a wheelchair)?: Total Help needed standing up from a chair using your arms (e.g., wheelchair or bedside chair)?: Total Help needed to walk in hospital room?: Total Help needed climbing 3-5 steps with a railing? : Total 6 Click Score: 6    End of Session Equipment Utilized During Treatment: Gait belt;Oxygen Activity Tolerance: Patient limited by pain;Patient limited by fatigue Patient left: in bed;with call bell/phone within reach   PT Visit Diagnosis: Other abnormalities of gait and mobility (R26.89);Pain Pain - Right/Left: Right Pain - part of body: Ankle and joints of foot    Time: 1445-1530 PT Time Calculation (min) (ACUTE ONLY): 45 min   Charges:   PT Evaluation $PT Eval High Complexity: 1 High   PT General Charges $$ ACUTE PT VISIT: 1 Visit         Micheline Portal, PT Acute Rehabilitation Services Office:[phone] 11/30/2023  Montie Portal 11/30/2023, 4:41 PM

## 2023-11-30 NOTE — H&P (Deleted)
 NAME:  Curtis Hill, MRN:  [id number], DOB:  [birth year], LOS: 1 ADMISSION DATE:  11/28/2023, CONSULTATION DATE:  11/29/23 REFERRING MD:  Melvenia CHIEF COMPLAINT: Hematuria  History of Present Illness:  Patient is a 78 year old male with past medical history of thoracic aortic ascending aneurysm, CAD, pulmonary hypertension, hyperlipidemia, hypertension, prostate cancer-treated with ERBT 2012, OSA on CPAP, asthma, iron deficiency anemia, recent hospitalization from 7/11 to 10/25/2023 for altered mental status secondary to sepsis due to pseudomonal UTI- discharged with levaquin , and also recent hospitalization with sepsis due to panniculitis from 5/24-09/13/23, who presents from [facility] nursing facility with complaints of hematuria.  Patient denied any pain or dysuria but reported that the hematuria started to occur on 8/17 and expresses that he cannot fully empty bladder/urinate.  Upon initial workup in the ED, patient sodium noted to be at 130, potassium 6.8, chloride 90, CO2 25, glucose 135, BUN 117, creatinine 5.14, albumin  2.2, AST 42, ALT 25, GFR 11, WBC 26, hemoglobin 8.6, hematocrit 29.5, platelets 524, lactic acid 1.7.  Urology was consulted due to the fact that nursing staff unable to place Foley catheter.  Ureteral dilatation with diagnostic cystoscopy s/p bladder irrigation with Foley placement.  Patient was given cefepime  for concern of UTI, treated for hyperkalemia with dextrose , Lokelma , insulin  and calcium  gluconate, and received a 2 L bolus for hypotension.  TRH was initially consulted for admission but despite initial fluid resuscitation, patient required initiation of a low-dose Levophed  infusion.  Due to vasopressor requirements, PCCM consulted for ICU admission.   CT renal stone study completed on 8/18- Showing mild bilateral hydro ureter, nonobstructing right nephrolithiasis, hyperdense debris's in the posterior bladder-concern for hemorrhage, Foley catheter in place.  Findings  consistent with emphysematous/hemorrhagic cystitis.  Pertinent  Medical History   Past Medical History:  Diagnosis Date   ABDOMINAL PAIN 05/18/2007   Qualifier: Diagnosis of  By: Henrietta LPN, Regina     ANEMIA, IRON DEFICIENCY 05/18/2007   Qualifier: Diagnosis of  By: Henrietta LPN, Regina     Aortic stenosis 07/19/2018   Mild by echo 05/2018   Arthritis    Back pain    Benign neoplasm of colon 03/28/2012   Bilateral swelling of feet and ankles    Cancer (HCC)    prostate cancer   Chronic back pain    Coronary artery calcification seen on CAT scan 03/17/2018   EPIGASTRIC PAIN 05/18/2007   Qualifier: Diagnosis of  By: Henrietta LPN, Regina     Fatty liver    GERD (gastroesophageal reflux disease)    HIATAL HERNIA 05/18/2007   Qualifier: Diagnosis of  By: Henrietta LPN, Regina     History of prostate cancer    Hyperlipidemia    Hypertension    Joint pain    LEUKOCYTOSIS 05/18/2007   Qualifier: Diagnosis of  By: Henrietta LPN, Regina     LIVER FUNCTION TESTS, ABNORMAL 05/18/2007   Qualifier: Diagnosis of  By: Henrietta LPN, Regina     MORBID OBESITY 05/18/2007   Qualifier: Diagnosis of  By: Henrietta LPN, Regina     Panniculitis    Pulmonary HTN (HCC) 11/02/2018   Shortness of breath on exertion    SINUSITIS, ACUTE 05/18/2007   Qualifier: Diagnosis of  By: Henrietta LPN, Regina     Skin rash    Sleep apnea    Thoracic aortic aneurysm (HCC) 03/17/2018   45mm by echo 05/2018 and 43mm by chest CTA 03/2018   THROMBOCYTOSIS 05/18/2007   Qualifier: Diagnosis of  By:  Laws LPN, [facility] Events: Including procedures, antibiotic start and stop dates in addition to other pertinent events   PCCM admit for severe sepsis/shock and hematuria, on of levophed    Interim History / Subjective:  +100 ml, net + 260 ml I/O. Afebrile Levo weaned off yesterday  Overall, patient states he feels better than compared to yesterday.  Objective    Blood pressure 104/77, pulse (!) 106, temperature 98.1 F (36.7 C),  temperature source Oral, resp. rate 14, weight (!) 158 kg, SpO2 98%.        Intake/Output Summary (Last 24 hours) at 11/30/2023 0748 Last data filed at 11/30/2023 0530 Gross per 24 hour  Intake 23379.52 ml  Output 76724 ml  Net 104.52 ml   Filed Weights   11/29/23 0400 11/30/23 0550  Weight: (!) 196.5 kg (!) 158 kg    Examination: General: chronically ill adult male.  HEENT: Normocephalic,  Pink MM CV: s1,s2, RRR-sinus tach with PACs, no MRG, No JVD  pulm: clear, diminished, no distress on Floyd Abs: bs active, soft, distention/obese, large panus  Extremities: trace lower extremity edema, warm and well perfused. Skin: no rash  Neuro: Alert and oriented, follows commands without focal deficit GU: foley intact with continuous bladder irrigation.  Light blood tinged urine, no clots.   Resolved problem list   Assessment and Plan  Severe sepsis with developing shock secondary to UTI with hemorrhagic component Hematuria Leukocytosis  Hx of Prostate Cancer- treated with ERBT (2012)  Treated for urosepsis secondary to pseudomonal UTI-7/11 to 10/25/2023-sensitive to cefepime  -Urology consulted due to urinary retention/gross hematuria/urethral stricture-patient underwent ureteral dilatation/diagnostic cystoscopy with Foley placement as well as bladder irrigation-estimated blood loss was at least 300 mL during procedure.  Findings consistent with large pannus causing retraction of penis, urethral meatal stenosis, urethral stricture.  Patient placed on continuous bladder irrigation.  CT renal stone study completed on 8/18: Showing mild bilateral hydro ureter, nonobstructing right nephrolithiasis, hyperdense debris's in the posterior bladder-concern for hemorrhage, Foley catheter in place.  Findings consistent with emphysematous/hemorrhagic cystitis. Lactic acid 1.7, patient has received 2 L of LR Required 2 units of PRBC 8/18 P: Levophed  weaned off 8/18 Urine culture pending,  continue  cefepime  Continue CBI per urology's recommendations, appreciate urology's assistance  AKI, improving Hyperkalemia, resolved Cr 5.14, BUN 117, K 6.8 on presentation P: Kidney function improving with foley placement/CBI Creatinine down to 2.6 this am.   Chronic respiratory insufficiency, on 3 L [place] baseline OSA-on CPAP Pulmonary hypertension Asthma P: CPAP at night Albuterol  as needed At baseline, but continue to wean as tolerated  Thoracic aortic aneurysm-CT scan on 06/22/22 showed unchanged aneurysmal dilatation of the ascending aorta, 4.1 cm  CAD Echo completed on 711/25-EF 60 to 65%, LV had normal function, unable to evaluate regional wall abnormalities, severe ventricular hypertrophy, grade 1 diastolic dysfunction, RV mildly enlarged, LA moderately dilated P: Will need annual evaluation of aortic aneurysm/outpatient  Hypertension Hyperlipidemia P: Hold antihypertensives, recently on vasopressors Hold statin since patient is n.p.o. at this time  Acute on chronic anemia in setting of hematuria IDA P: Trend H&H serially every 6 hours Monitor for significant hematuria, continue CBI per urology  Morbid obesity BMI 67.85 P: Education of diet/exercise when medically appropriate Will need RD consult to assess nutritional status Keep n.p.o. for now per urology  GERD P: Protonix  40mg  IV   GOC:  On 8/18 code status was discussed- patient remaining full code but does want  to think about goals of care if were to decompensate.  Palliative care was consulted for ongoing goals of care discussions  Deconditioned state Minimal mobility in the facility he has been in. PT/OT consult ordered.   Best Practice (right click and Reselect all SmartList Selections daily)   Diet/type: NPO Bedside swallow completed and start diet  DVT prophylaxis SCD in setting of hematuria  Pressure ulcer(s): N/A GI prophylaxis: PPI > discontinue today  Lines: N/A Foley:  N/A Continuous bladder  irrigation  Code Status:  full code Last date of multidisciplinary goals of care discussion [8/18] No ICU needs, will transfer care to hospitalist team.  RN to determine if patient able to transfer out of ICU with continuous bladder irrigation.   Labs   CBC: Recent Labs  Lab 11/28/23 2137 11/29/23 0448 11/29/23 0926 11/29/23 1035 11/29/23 1828 11/29/23 2039 11/30/23 0005 11/30/23 0339  WBC 26.0* 25.7*  --   --   --   --   --  14.1*  NEUTROABS 23.6*  --   --   --   --   --   --   --   HGB 8.6* 7.6*   < > 8.5* 8.2* 9.1* 8.0* 7.9*  HCT 29.5* 25.9*   < > 25.0* 26.6* 29.6* 25.9* 25.5*  MCV 89.9 89.6  --   --   --   --   --  87.9  PLT 524* 466*  --   --   --   --   --  365   < > = values in this interval not displayed.    Basic Metabolic Panel: Recent Labs  Lab 11/29/23 0448 11/29/23 0926 11/29/23 1035 11/29/23 1828 11/29/23 2039 11/30/23 0339  NA 131* 131* 132* 137 137 139  K 6.1* 5.6* 5.6* 4.7 5.1 4.1  CL 94* 95*  --  98 99 102  CO2 26 25  --  28 28 27   GLUCOSE 129* 127*  --  108* 102* 96  BUN 108* 103*  --  94* 92* 80*  CREATININE 4.54* 3.90*  --  3.18* 3.14* 2.60*  CALCIUM  8.8* 8.7*  --  8.8* 8.9 8.8*  MG 2.0  --   --   --   --  1.8  PHOS 5.2* 5.0*  --   --   --  4.7*   GFR: Estimated Creatinine Clearance: 34.6 mL/min (A) (by C-G formula based on SCr of 2.6 mg/dL (H)). Recent Labs  Lab 11/28/23 2137 11/29/23 0036 11/29/23 0448 11/29/23 0926 11/30/23 0339  WBC 26.0*  --  25.7*  --  14.1*  LATICACIDVEN  --  1.7 2.1* 1.0  --     Liver Function Tests: Recent Labs  Lab 11/28/23 2137 11/29/23 0448  AST 42* 35  ALT 25 24  ALKPHOS 125 122  BILITOT 0.5 0.5  PROT 8.9* 7.8  ALBUMIN  2.2* 2.0*   No results for input(s): LIPASE, AMYLASE in the last 168 hours. No results for input(s): AMMONIA in the last 168 hours.  ABG    Component Value Date/Time   PHART 7.415 11/29/2023 1035   PCO2ART 47.4 11/29/2023 1035   PO2ART 90 11/29/2023 1035   HCO3 30.3  (H) 11/29/2023 1035   TCO2 32 11/29/2023 1035   O2SAT 97 11/29/2023 1035     Coagulation Profile: Recent Labs  Lab 11/29/23 0448  INR 1.1    Cardiac Enzymes: No results for input(s): CKTOTAL, CKMB, CKMBINDEX, TROPONINI in the last 168 hours.  HbA1C: Hgb A1c  MFr Bld  Date/Time Value Ref Range Status  12/04/2020 02:45 PM 5.6 4.8 - 5.6 % Final    Comment:             Prediabetes: 5.7 - 6.4          Diabetes: >6.4          Glycemic control for adults with diabetes: <7.0   05/23/2020 11:07 AM 5.6 4.8 - 5.6 % Final    Comment:             Prediabetes: 5.7 - 6.4          Diabetes: >6.4          Glycemic control for adults with diabetes: <7.0     CBG: Recent Labs  Lab 11/28/23 2236 11/29/23 0508  GLUCAP 120* 123*   Home Medications  Prior to Admission medications   Medication Sig Start Date End Date Taking? Authorizing Provider  albuterol  (VENTOLIN  HFA) 108 (90 Base) MCG/ACT inhaler Inhale 2 puffs into the lungs every 6 (six) hours as needed for wheezing or shortness of breath.    [provider]  allopurinol  (ZYLOPRIM ) 300 MG tablet Take 300 mg by mouth daily.    [provider]  ammonium lactate (LAC-HYDRIN) 12 % lotion Apply 1 Application topically 2 (two) times daily as needed for dry skin. Apply to bilateral legs and feet two times a day for dry, irritated skin. Rub in well.    [provider]  aspirin  EC 81 MG tablet Take 81 mg by mouth daily. Swallow whole.    [provider]  atorvastatin  (LIPITOR) 20 MG tablet Take 20 mg by mouth every evening.    [provider]  calcium  carbonate (TUMS - DOSED IN MG ELEMENTAL CALCIUM ) 500 MG chewable tablet Chew 1 tablet by mouth 2 (two) times daily. Give for calcium  supplementation    [provider]  cholecalciferol (VITAMIN D3) 25 MCG (1000 UNIT) tablet Take 1,000 Units by mouth daily.    [provider]  Coenzyme Q10 (COQ10) 200 MG CAPS Take 1 capsule by  mouth in the morning.    [provider]  collagenase  (SANTYL ) 250 UNIT/GM ointment Apply topically daily. 10/25/23   Lue Elsie BROCKS, MD  cyanocobalamin (VITAMIN B12) 500 MCG tablet Take 1,000 mcg by mouth daily. 09/09/17   [provider]  furosemide  (LASIX ) 40 MG tablet Take 40 mg by mouth daily as needed for edema or fluid.    [provider]  gabapentin  (NEURONTIN ) 300 MG capsule Take 300 mg by mouth 3 (three) times daily. Give one capsule three times daily for nerve pain .    [provider]  Magnesium  200 MG TABS Take 200 mg by mouth daily at 12 noon.    [provider]  Multiple Vitamins-Minerals (CENTRUM SILVER 50+MEN) TABS Take 1 tablet by mouth in the morning.    [provider]  Nystatin  (GERHARDT'S BUTT CREAM) CREA Apply 1 Application topically 3 (three) times daily. 10/25/23   Lue Elsie BROCKS, MD  nystatin  (MYCOSTATIN /NYSTOP ) powder Apply topically 3 (three) times daily. Patient taking differently: Apply 1 Application topically 3 (three) times daily. Apply to groin topically three times daily. 09/13/23   Wouk, Devaughn Sayres, MD  Propyl Glycol-Hydroxyethylcell (NASAL MOIST) GEL Place 1 Units into both nostrils daily. Give for dryness    [provider]  Semaglutide-Weight Management 0.25 MG/0.5ML SOAJ Inject 0.25 mg into the skin once a week. Inject every Wednesday. 10/13/23   [provider]  simethicone  (MYLICON) 80 MG chewable tablet Chew 160 mg by mouth 3 (three) times daily after meals. Give for GI distress    [provider]  traMADol  (ULTRAM ) 50 MG tablet Take 1 tablet (50 mg total) by mouth 2 (two) times daily as needed for moderate pain (pain score 4-6) or severe pain (pain score 7-10). Patient taking differently: Take 50 mg by mouth every 6 (six) hours as needed for moderate pain (pain score 4-6) or severe pain (pain score 7-10). 09/13/23   Wouk, Devaughn Sayres, MD         [facility] 11/30/2023, 7:48 AM  Please see [email or web address] for pager details.  From 7A-7P if no response, please call [phone]. After hours, please call ELink [phone].

## 2023-11-30 NOTE — Progress Notes (Signed)
   11/30/23 1957  BiPAP/CPAP/SIPAP  BiPAP/CPAP/SIPAP Pt Type Adult  BiPAP/CPAP/SIPAP Resmed  Reason BIPAP/CPAP not in use  (pt doesn't want to wear CPAP for the night)

## 2023-12-01 DIAGNOSIS — R310 Gross hematuria: Secondary | ICD-10-CM | POA: Diagnosis not present

## 2023-12-01 LAB — BASIC METABOLIC PANEL WITH GFR
Anion gap: 11 (ref 5–15)
BUN: 70 mg/dL — ABNORMAL HIGH (ref 8–23)
CO2: 28 mmol/L (ref 22–32)
Calcium: 8.7 mg/dL — ABNORMAL LOW (ref 8.9–10.3)
Chloride: 99 mmol/L (ref 98–111)
Creatinine, Ser: 1.82 mg/dL — ABNORMAL HIGH (ref 0.61–1.24)
GFR, Estimated: 38 mL/min — ABNORMAL LOW (ref >60)
Glucose, Bld: 115 mg/dL — ABNORMAL HIGH (ref 70–99)
Potassium: 3.7 mmol/L (ref 3.5–5.1)
Sodium: 138 mmol/L (ref 135–145)

## 2023-12-01 LAB — PHOSPHORUS: Phosphorus: 3.4 mg/dL (ref 2.5–4.6)

## 2023-12-01 LAB — HEMOGLOBIN AND HEMATOCRIT, BLOOD
HCT: 29.7 % — ABNORMAL LOW (ref 39.0–52.0)
Hemoglobin: 8.9 g/dL — ABNORMAL LOW (ref 13.0–17.0)

## 2023-12-01 LAB — MAGNESIUM: Magnesium: 1.6 mg/dL — ABNORMAL LOW (ref 1.7–2.4)

## 2023-12-01 MED ORDER — SODIUM CHLORIDE 0.9 % IV SOLN
2.0000 g | Freq: Two times a day (BID) | INTRAVENOUS | Status: AC
  Administered 2023-12-01 – 2023-12-07 (×14): 2 g via INTRAVENOUS
  Filled 2023-12-01 (×14): qty 12.5

## 2023-12-01 MED ORDER — POTASSIUM CHLORIDE CRYS ER 20 MEQ PO TBCR
40.0000 meq | EXTENDED_RELEASE_TABLET | Freq: Once | ORAL | Status: AC
  Administered 2023-12-01: 40 meq via ORAL
  Filled 2023-12-01: qty 2

## 2023-12-01 MED ORDER — MAGNESIUM SULFATE 2 GM/50ML IV SOLN: 2.0000 g | Freq: Once | INTRAVENOUS | Status: DC

## 2023-12-01 MED ORDER — METOPROLOL TARTRATE 12.5 MG HALF TABLET
12.5000 mg | ORAL_TABLET | Freq: Two times a day (BID) | ORAL | Status: DC | PRN
  Filled 2023-12-01: qty 1

## 2023-12-01 MED ORDER — MAGNESIUM SULFATE 4 GM/100ML IV SOLN
4.0000 g | Freq: Once | INTRAVENOUS | Status: AC
  Administered 2023-12-01: 4 g via INTRAVENOUS
  Filled 2023-12-01: qty 100

## 2023-12-01 NOTE — Progress Notes (Signed)
 PROGRESS NOTE Curtis Hill  FMW:[id number] DOB: 01/31/1946 DOA: 11/28/2023 PCP: Curtis Carlin Redbird, MD  Brief Narrative/Hospital Course: 78 yo male history of thoracic aortic ascending aneurysm, CAD, pulmonary hypertension, hyperlipidemia, hypertension, prostate cancer-treated with ERBT 2012, OSA on CPAP, asthma, iron deficiency anemia, recent hospitalization from 7/11 to 10/25/2023 for altered mental status secondary to sepsis due to pseudomonal UTI- discharged with levaquin , and also recent hospitalization with sepsis due to panniculitis from 5/24-09/13/23, who presents from [facility] nursing facility with complaints of hematuria. CT STONE:Showing mild bilateral hydro ureter, nonobstructing right nephrolithiasis, hyperdense debris's in the posterior bladder-concern for hemorrhage, Foley catheter in place. Findings consistent with emphysematous/hemorrhagic cystitis.  He was admitted 11/29/23 with septic shock and hematuria with cystitis/recent UTI. Has been Off vasopressors since 8/18 and is alert and oriented w/ baseline 3 L [place] ,urology following, on CBI  and Cefepime  per last urine culture sensitivities.  Patient transferred to [facility] 8/20  Subjective: Seen and examined today Overnight afebrile BP stable on 3l [place] Labs reviewed creatinine down to 1.8, mag 1.6 hemoglobin 7.9 (8/19) He is off CBI urology at bedside patient is alert awake oriented but weak and frail  Assessment and plan:   Septic shock secondary to UTI Hemorrhagic cystitis Emphysematous cystitis Hematuria Hx of Prostate Cancer- treated with ERBT (2012) Recent sepsis with Pseudomonas/ UTI-7/11 to 10/25/2023-sensitive to cefepime : Patient volume resuscitated received 2 unit PRBC 8/18 and also pressors  urology following closely on CBI clamped on the morning of 8/19, noted plan for voiding trial CT renal stone study completed on 8/18: see above. Off pressors vital stable.  Urine culture 8/18 no growth, blood culture no growth from  8/18 On empiric cefepime  based on previous culture-continue duration as per urology-  Acute kidney injury Hypekalemia Hypomagnesemia: Resolving AKI.Monitor closely.  Replace magnesium .  Continue Foley catheter as above Recent Labs    10/23/23 0938 10/24/23 0437 10/25/23 0358 11/28/23 2137 11/29/23 0448 11/29/23 0926 11/29/23 1035 11/29/23 1828 11/29/23 2039 11/30/23 0339 12/01/23 0317  BUN 32* 33* 28* 117* 108* 103*  --  94* 92* 80* 70*  CREATININE 1.29* 1.22 1.15 5.14* 4.54* 3.90*  --  3.18* 3.14* 2.60* 1.82*  CO2 28 27 28 25 26 25   --  28 28 27 28   K 4.7 4.4 3.8 6.8* 6.1* 5.6* 5.6* 4.7 5.1 4.1 3.7     ABLA due to hematuria History of chronic anemia/IDA: Hemoglobin holding stable in 7 to 8 g, monitor s/p 2 u prbc so far. Recent Labs    09/05/23 0831 09/06/23 0256 11/29/23 0448 11/29/23 0926 11/30/23 0005 11/30/23 0339 11/30/23 0920 11/30/23 1913 12/01/23 0801  HGB 11.9*   < > 7.6*   < > 8.0* 7.9* 7.7* 7.9* 8.9*  MCV 88.3   < > 89.6  --   --  87.9  --   --   --   VITAMINB12 757  --   --   --   --   --   --   --   --   FOLATE 13.4  --   --   --   --   --   --   --   --   FERRITIN 233  --   --   --   --   --   --   --   --   TIBC 307  --   --   --   --   --   --   --   --  IRON 37*  --   --   --   --   --   --   --   --   RETICCTPCT 1.3  --   --   --   --   --   --   --   --    < > = values in this interval not displayed.   Chronic respiratory failure on 3 L nasal cannula OSA on CPAP Asthma Pulmonary hypertension: Cont 3 l [place] and CPAP at night prn nebs   Thoracic aortic aneurysm: CAD CT scan on 06/22/22 showed unchanged aneurysmal dilatation of the ascending aorta, 4.1 cm-advised outpatient follow-up. echo from July normal EF, severe ventricular hypertrophy, grade 1 diastolic dysfunction, RV mildly enlarged, LA moderately dilated Follow-up with annual evaluation of aortic aneurysm/outpatient   Hypertension Hyperlipidemia: Well-controlled holding  antihypertensive . Cont statin for now.   Morbid obesity W/ Body mass index is 54.56 kg/m.  Will benefit with weight loss healthy lifestyle  GERD: Cont ppi  Goals of care discussion/CODE STATUS: Full code. PMT following   Deconditioning: Cont ptot .Minimal mobility  at SNF  Mobility: PT Orders: Active PT Follow up Rec: Skilled Nursing-Short Term Rehab (<3 Hours/Day)11/30/2023 1639   DVT prophylaxis: SCDs Start: 11/29/23 0405 Code Status:   Code Status: Full Code Family Communication: plan of care discussed with patient at bedside. Patient status is: Remains hospitalized because of severity of illness Level of care: Telemetry Medical   Dispo: The patient is from: snf            Anticipated disposition: TBD Objective: Vitals last 24 hrs: Vitals:   12/01/23 0700 12/01/23 0737 12/01/23 0800 12/01/23 1116  BP: 127/69  114/63   Pulse: 85  99   Resp: 11  17   Temp:  98 F (36.7 C)  97.8 F (36.6 C)  TempSrc:  Oral  Axillary  SpO2: 99%  90%   Weight:      Height:       Physical Examination: General exam: alert awake, oriented, older than stated age HEENT:Oral mucosa moist, Ear/Nose WNL grossly Respiratory system: Bilaterally clear BS,no use of accessory muscle Cardiovascular system: S1 & S2 +, No JVD. Gastrointestinal system: Abdomen soft,NT,ND, BS+ Nervous System: Alert, awake, moving all extremities,and following commands. Extremities: LE edema neg, distal extremities warm.  Skin: No rashes,no icterus. MSK: Normal muscle bulk,tone, power   Medications reviewed:  Scheduled Meds:  (feeding supplement) PROSource Plus  30 mL Oral BID BM   allopurinol   300 mg Oral Daily   atorvastatin   20 mg Oral Daily   Chlorhexidine  Gluconate Cloth  6 each Topical Daily   gabapentin   100 mg Oral TID   Gerhardt's butt cream   Topical BID   leptospermum manuka honey  1 Application Topical Daily   nutrition supplement (JUVEN)  1 packet Oral BID BM   Ensure Max Protein  11 oz Oral  Daily   senna-docusate  1 tablet Oral BID   Continuous Infusions:  ceFEPime  (MAXIPIME ) IV 2 g (12/01/23 0913)   Diet: Diet Order             Diet Heart Room service appropriate? Yes with Assist; Fluid consistency: Thin  Diet effective now                   Data Reviewed: I have personally reviewed following labs and imaging studies ( see epic result tab) CBC: Recent Labs  Lab 11/28/23 2137 11/29/23 0448 11/29/23 0926 11/30/23  0005 11/30/23 0339 11/30/23 0920 11/30/23 1913 12/01/23 0801  WBC 26.0* 25.7*  --   --  14.1*  --   --   --   NEUTROABS 23.6*  --   --   --   --   --   --   --   HGB 8.6* 7.6*   < > 8.0* 7.9* 7.7* 7.9* 8.9*  HCT 29.5* 25.9*   < > 25.9* 25.5* 25.2* 25.9* 29.7*  MCV 89.9 89.6  --   --  87.9  --   --   --   PLT 524* 466*  --   --  365  --   --   --    < > = values in this interval not displayed.   CMP: Recent Labs  Lab 11/29/23 0448 11/29/23 0926 11/29/23 1035 11/29/23 1828 11/29/23 2039 11/30/23 0339 12/01/23 0317  NA 131* 131* 132* 137 137 139 138  K 6.1* 5.6* 5.6* 4.7 5.1 4.1 3.7  CL 94* 95*  --  98 99 102 99  CO2 26 25  --  28 28 27 28   GLUCOSE 129* 127*  --  108* 102* 96 115*  BUN 108* 103*  --  94* 92* 80* 70*  CREATININE 4.54* 3.90*  --  3.18* 3.14* 2.60* 1.82*  CALCIUM  8.8* 8.7*  --  8.8* 8.9 8.8* 8.7*  MG 2.0  --   --   --   --  1.8 1.6*  PHOS 5.2* 5.0*  --   --   --  4.7* 3.4   GFR: Estimated Creatinine Clearance: 49.5 mL/min (A) (by C-G formula based on SCr of 1.82 mg/dL (H)). Recent Labs  Lab 11/28/23 2137 11/29/23 0448  AST 42* 35  ALT 25 24  ALKPHOS 125 122  BILITOT 0.5 0.5  PROT 8.9* 7.8  ALBUMIN  2.2* 2.0*   No results for input(s): LIPASE, AMYLASE in the last 168 hours. No results for input(s): AMMONIA in the last 168 hours. Coagulation Profile:  Recent Labs  Lab 11/29/23 0448  INR 1.1   Unresulted Labs (From admission, onward)     Start     Ordered   12/02/23 0500  Basic metabolic panel  Tomorrow  morning,   R       Question:  Specimen collection method  Answer:  Lab=Lab collect   12/01/23 0610   12/02/23 0500  CBC  Daily,   R     Question:  Specimen collection method  Answer:  Lab=Lab collect   12/01/23 0950   11/30/23 0500  Magnesium   Daily,   R      11/29/23 9177           Antimicrobials/Microbiology: Anti-infectives (From admission, onward)    Start     Dose/Rate Route Frequency Ordered Stop   12/01/23 1000  ceFEPIme  (MAXIPIME ) 2 g in sodium chloride  0.9 % 100 mL IVPB        2 g 200 mL/hr over 30 Minutes Intravenous Every 12 hours 12/01/23 0750     11/30/23 0200  cefTRIAXone  (ROCEPHIN ) 2 g in sodium chloride  0.9 % 100 mL IVPB  Status:  Discontinued        2 g 200 mL/hr over 30 Minutes Intravenous Every 24 hours 11/29/23 0413 11/29/23 0459   11/29/23 2300  ceFEPIme  (MAXIPIME ) 2 g in sodium chloride  0.9 % 100 mL IVPB  Status:  Discontinued        2 g 200 mL/hr over 30 Minutes Intravenous Every 24  hours 11/29/23 0459 12/01/23 0750   11/29/23 0000  ceFEPIme  (MAXIPIME ) 2 g in sodium chloride  0.9 % 100 mL IVPB        2 g 200 mL/hr over 30 Minutes Intravenous  Once 11/28/23 2349 11/29/23 0122         Component Value Date/Time   SDES URINE, RANDOM 11/29/2023 0538   SPECREQUEST NONE Reflexed from [id number] 11/29/2023 0538   CULT  11/29/2023 0538    NO GROWTH Performed at [facility], [address] [place], [place] [address]    REPTSTATUS 11/30/2023 FINAL 11/29/2023 9461  Procedures: Mennie LAMY, MD [facility] 12/01/2023, 1:15 PM

## 2023-12-01 NOTE — Progress Notes (Signed)
 [facility] ADULT ICU REPLACEMENT PROTOCOL   The patient does apply for the [facility] Adult ICU Electrolyte Replacment Protocol based on the criteria listed below:   1.Exclusion criteria: TCTS, ECMO, Dialysis, and Myasthenia Gravis patients 2. Is GFR >/= 30 ml/min? Yes.    Patient's GFR today is 38 3. Is SCr </= 2? Yes.   Patient's SCr is 1.82 mg/dL 4. Did SCr increase >/= 0.5 in 24 hours? No. 5.Pt's weight >40kg  Yes.   6. Abnormal electrolyte(s):   K 3.7, Mg 1.6  7. Electrolytes replaced per protocol 8.  Call MD STAT for K+ </= 2.5, Phos </= 1, or Mag </= 1 Physician:  CLEMENTEEN Epimenio Blackbird R Shina Wass 12/01/2023 6:11 AM

## 2023-12-01 NOTE — Progress Notes (Signed)
     Subjective: CBI clamped overnight.  No hematuria this morning.  Reviewed case and plan with hospitalist, nursing, and patient at bedside.  Objective: Vital signs in last 24 hours: Temp:  [97.8 F (36.6 C)-98.7 F (37.1 C)] 97.8 F (36.6 C) (08/20 1116) Pulse Rate:  [85-142] 113 (08/20 1300) Resp:  [11-21] 21 (08/20 1300) BP: (95-150)/(63-135) 133/85 (08/20 1300) SpO2:  [71 %-99 %] 95 % (08/20 1300)  Assessment/Plan: # Hematuria # Emphysematous cystitis # Morbid obesity  CBI clamped on the morning of 11/30/2023.  Urine still clear the morning of 8/20.  Discontinue CBI.  NGTD on UCx but Pseudomonas and Proteus positive on 10/22/2023 culture and sensitivity.  Would recommend at least one week of treatment for emphysematous cystitis.  Consider repeat imaging after completing ABX.  Consider a.m. voiding trial afterwards.  External beam radiation and radioactive seed placement 12/11/2010.  Urology will follow  Intake/Output from previous day: 08/19 0701 - 08/20 0700 In: 8129.8 [P.O.:720; IV Piggyback:609.8] Out: 4075 [Urine:4075]  Intake/Output this shift: Total I/O In: 84.7 [IV Piggyback:84.7] Out: -   Physical Exam:  General: Alert and oriented CV: No cyanosis Lungs: equal chest rise Abdomen: Soft, NTND, no rebound or guarding Gu: 2f three-way in place with CBI clamped  Lab Results: Recent Labs    11/30/23 0920 11/30/23 1913 12/01/23 0801  HGB 7.7* 7.9* 8.9*  HCT 25.2* 25.9* 29.7*   BMET Recent Labs    11/29/23 0448 11/29/23 0926 11/30/23 0339 11/30/23 0920 11/30/23 1913 12/01/23 0317 12/01/23 0801  NA 131*   < > 139  --   --  138  --   K 6.1*   < > 4.1  --   --  3.7  --   CL 94*   < > 102  --   --  99  --   CO2 26   < > 27  --   --  28  --   GLUCOSE 129*   < > 96  --   --  115*  --   BUN 108*   < > 80*  --   --  70*  --   CREATININE 4.54*   < > 2.60*  --   --  1.82*  --   CALCIUM  8.8*   < > 8.8*  --   --  8.7*  --   HGB 7.6*   < > 7.9*   < >  7.9*  --  8.9*  WBC 25.7*  --  14.1*  --   --   --   --    < > = values in this interval not displayed.     Studies/Results: No results found.     LOS: 2 days   Ole Bourdon, NP [facility] Pager: [phone]  12/01/2023, 2:33 PM

## 2023-12-01 NOTE — Progress Notes (Signed)
 PHARMACY NOTE:  ANTIMICROBIAL RENAL DOSAGE ADJUSTMENT  Current antimicrobial regimen includes a mismatch between antimicrobial dosage and estimated renal function.  As per policy approved by the Pharmacy & Therapeutics and Medical Executive Committees, the antimicrobial dosage will be adjusted accordingly.  Current antimicrobial dosage:  cefepime  2g Q24H  Indication: UTI  Renal Function: Estimated Creatinine Clearance: 49.5 mL/min (A) (by C-G formula based on SCr of 1.82 mg/dL (H)).    Antimicrobial dosage has been changed to:  cefepime  2g Q12H  Additional comments: dose increased due to improved renal function  Thank you for allowing pharmacy to be a part of this patient's care.  Maurilio Patten, PharmD PGY1 Pharmacy Resident [facility] 12/01/2023 7:51 AM

## 2023-12-01 NOTE — Hospital Course (Addendum)
 78 yo male history of thoracic aortic ascending aneurysm, CAD, pulmonary hypertension, hyperlipidemia, hypertension, prostate cancer-treated with ERBT 2012, OSA on CPAP, asthma, iron deficiency anemia, recent hospitalization from 7/11 to 10/25/2023 for altered mental status secondary to sepsis due to pseudomonal UTI- discharged with levaquin , and also recent hospitalization with sepsis due to panniculitis from 5/24-09/13/23, who presents from [facility] nursing facility with complaints of hematuria. CT STONE:Showing mild bilateral hydro ureter, nonobstructing right nephrolithiasis, hyperdense debris's in the posterior bladder-concern for hemorrhage, Foley catheter in place. Findings consistent with emphysematous/hemorrhagic cystitis.  He was admitted 11/29/23 with septic shock and hematuria with cystitis/recent UTI. Has been Off vasopressors since 8/18 and is alert and oriented w/ baseline 3 L [place] ,urology following, on CBI  and Cefepime  per last urine culture sensitivities.  Patient transferred to [facility] 8/20  Subjective: Seen and examined today Overnight afebrile BP stable on 3l  Labs reviewed creatinine down to 1.8, mag 1.6 hemoglobin 7.9 (8/19) He is off CBI urology at bedside patient is alert awake oriented but weak and frail  Assessment and plan:   Septic shock secondary to UTI Hemorrhagic cystitis Emphysematous cystitis Hematuria Hx of Prostate Cancer- treated with ERBT (2012) Recent sepsis with Pseudomonas/ UTI-7/11 to 10/25/2023-sensitive to cefepime : Patient volume resuscitated received 2 unit PRBC 8/18 and also pressors  urology following closely on CBI clamped on the morning of 8/19, noted plan for voiding trial CT renal stone study completed on 8/18: see above. Off pressors vital stable.  Urine culture 8/18 no growth, blood culture no growth from 8/18 On empiric cefepime  based on previous culture-continue duration as per urology-  Acute kidney  injury Hypekalemia Hypomagnesemia: Resolving AKI.Monitor closely.  Replace magnesium .  Continue Foley catheter as above Recent Labs    10/23/23 0938 10/24/23 0437 10/25/23 0358 11/28/23 2137 11/29/23 0448 11/29/23 0926 11/29/23 1035 11/29/23 1828 11/29/23 2039 11/30/23 0339 12/01/23 0317  BUN 32* 33* 28* 117* 108* 103*  --  94* 92* 80* 70*  CREATININE 1.29* 1.22 1.15 5.14* 4.54* 3.90*  --  3.18* 3.14* 2.60* 1.82*  CO2 28 27 28 25 26 25   --  28 28 27 28   K 4.7 4.4 3.8 6.8* 6.1* 5.6* 5.6* 4.7 5.1 4.1 3.7     ABLA due to hematuria History of chronic anemia/IDA: Hemoglobin holding stable in 7 to 8 g, monitor s/p 2 u prbc so far. Recent Labs    09/05/23 0831 09/06/23 0256 11/29/23 0448 11/29/23 0926 11/30/23 0005 11/30/23 0339 11/30/23 0920 11/30/23 1913 12/01/23 0801  HGB 11.9*   < > 7.6*   < > 8.0* 7.9* 7.7* 7.9* 8.9*  MCV 88.3   < > 89.6  --   --  87.9  --   --   --   VITAMINB12 757  --   --   --   --   --   --   --   --   FOLATE 13.4  --   --   --   --   --   --   --   --   FERRITIN 233  --   --   --   --   --   --   --   --   TIBC 307  --   --   --   --   --   --   --   --   IRON 37*  --   --   --   --   --   --   --   --  RETICCTPCT 1.3  --   --   --   --   --   --   --   --    < > = values in this interval not displayed.   Chronic respiratory failure on 3 L nasal cannula OSA on CPAP Asthma Pulmonary hypertension: Cont 3 l [place] and CPAP at night prn nebs   Thoracic aortic aneurysm: CAD CT scan on 06/22/22 showed unchanged aneurysmal dilatation of the ascending aorta, 4.1 cm-advised outpatient follow-up. echo from July normal EF, severe ventricular hypertrophy, grade 1 diastolic dysfunction, RV mildly enlarged, LA moderately dilated Follow-up with annual evaluation of aortic aneurysm/outpatient   Hypertension Hyperlipidemia: Well-controlled holding antihypertensive . Cont statin for now.   Morbid obesity W/ Body mass index is 54.56 kg/m.  Will benefit  with weight loss healthy lifestyle  GERD: Cont ppi  Goals of care discussion/CODE STATUS: Full code. PMT following   Deconditioning: Cont ptot .Minimal mobility  at SNF  Mobility: PT Orders: Active PT Follow up Rec: Skilled Nursing-Short Term Rehab (<3 Hours/Day)11/30/2023 1639   DVT prophylaxis: SCDs Start: 11/29/23 0405 Code Status:   Code Status: Full Code Family Communication: plan of care discussed with patient at bedside. Patient status is: Remains hospitalized because of severity of illness Level of care: Telemetry Medical   Dispo: The patient is from: snf            Anticipated disposition: TBD Objective: Vitals last 24 hrs: Vitals:   12/01/23 0700 12/01/23 0737 12/01/23 0800 12/01/23 1116  BP: 127/69  114/63   Pulse: 85  99   Resp: 11  17   Temp:  98 F (36.7 C)  97.8 F (36.6 C)  TempSrc:  Oral  Axillary  SpO2: 99%  90%   Weight:      Height:       Physical Examination: General exam: alert awake, oriented, older than stated age HEENT:Oral mucosa moist, Ear/Nose WNL grossly Respiratory system: Bilaterally clear BS,no use of accessory muscle Cardiovascular system: S1 & S2 +, No JVD. Gastrointestinal system: Abdomen soft,NT,ND, BS+ Nervous System: Alert, awake, moving all extremities,and following commands. Extremities: LE edema neg, distal extremities warm.  Skin: No rashes,no icterus. MSK: Normal muscle bulk,tone, power   Medications reviewed:  Scheduled Meds:  (feeding supplement) PROSource Plus  30 mL Oral BID BM   allopurinol   300 mg Oral Daily   atorvastatin   20 mg Oral Daily   Chlorhexidine  Gluconate Cloth  6 each Topical Daily   gabapentin   100 mg Oral TID   Gerhardt's butt cream   Topical BID   leptospermum manuka honey  1 Application Topical Daily   nutrition supplement (JUVEN)  1 packet Oral BID BM   Ensure Max Protein  11 oz Oral Daily   senna-docusate  1 tablet Oral BID   Continuous Infusions:  ceFEPime  (MAXIPIME ) IV 2 g (12/01/23  0913)   Diet: Diet Order             Diet Heart Room service appropriate? Yes with Assist; Fluid consistency: Thin  Diet effective now

## 2023-12-01 NOTE — TOC Progression Note (Signed)
 Transition of Care [facility]) - Progression Note    Patient Details  Name: [name] MRN: [id number] Date of Birth: 1946/01/26  Transition of Care [facility]) CM/SW Contact  Lendia Dais, [place] Phone Number: 12/01/2023, 4:21 PM  Clinical Narrative:  Pt is from home with wife [name].  CSW spoke to patient and they confirmed they are from [facility]. Patient expressed that they did not feel they received the care that was needed at [facility]. Pt states that the lack of care at [facility] is the reason for their admission at the hospital.   Pt had a preference of HH and CSW educated the patient about HH doing 1-2 sessions vs a SNF working with them 5x a week.. Pt stated understanding and was willing to go to a SNF other than [facility].  CSW mentioned that a medicare list can be provided of SNF's within the [place] area and the patient was agreeable requested that the list would be sent to his wife also.  ICM will continue to follow.    Expected Discharge Plan: Skilled Nursing Facility Barriers to Discharge: Continued Medical Work up               Expected Discharge Plan and Services                                               Social Drivers of Health (SDOH) Interventions SDOH Screenings   Food Insecurity: No Food Insecurity (11/30/2023)  Housing: Low Risk  (11/30/2023)  Transportation Needs: Unmet Transportation Needs (11/30/2023)  Utilities: Not At Risk (11/30/2023)  Depression (PHQ2-9): Medium Risk (05/23/2020)  Social Connections: Moderately Integrated (11/30/2023)  Tobacco Use: Low Risk  (11/28/2023)    Readmission Risk Interventions     No data to display

## 2023-12-02 DIAGNOSIS — E875 Hyperkalemia: Secondary | ICD-10-CM | POA: Diagnosis not present

## 2023-12-02 DIAGNOSIS — A419 Sepsis, unspecified organism: Secondary | ICD-10-CM | POA: Diagnosis not present

## 2023-12-02 DIAGNOSIS — N3091 Cystitis, unspecified with hematuria: Secondary | ICD-10-CM | POA: Diagnosis not present

## 2023-12-02 DIAGNOSIS — N179 Acute kidney failure, unspecified: Secondary | ICD-10-CM | POA: Diagnosis not present

## 2023-12-02 LAB — CBC
HCT: 24.4 % — ABNORMAL LOW (ref 39.0–52.0)
Hemoglobin: 7.6 g/dL — ABNORMAL LOW (ref 13.0–17.0)
MCH: 27.6 pg (ref 26.0–34.0)
MCHC: 31.1 g/dL (ref 30.0–36.0)
MCV: 88.7 fL (ref 80.0–100.0)
Platelets: 345 K/uL (ref 150–400)
RBC: 2.75 MIL/uL — ABNORMAL LOW (ref 4.22–5.81)
RDW: 16.8 % — ABNORMAL HIGH (ref 11.5–15.5)
WBC: 10.6 K/uL — ABNORMAL HIGH (ref 4.0–10.5)
nRBC: 0 % (ref 0.0–0.2)

## 2023-12-02 LAB — BASIC METABOLIC PANEL WITH GFR
Anion gap: 12 (ref 5–15)
BUN: 54 mg/dL — ABNORMAL HIGH (ref 8–23)
CO2: 29 mmol/L (ref 22–32)
Calcium: 8.5 mg/dL — ABNORMAL LOW (ref 8.9–10.3)
Chloride: 100 mmol/L (ref 98–111)
Creatinine, Ser: 1.21 mg/dL (ref 0.61–1.24)
GFR, Estimated: >60 mL/min (ref >60)
Glucose, Bld: 106 mg/dL — ABNORMAL HIGH (ref 70–99)
Potassium: 3.5 mmol/L (ref 3.5–5.1)
Sodium: 141 mmol/L (ref 135–145)

## 2023-12-02 LAB — MAGNESIUM: Magnesium: 1.7 mg/dL (ref 1.7–2.4)

## 2023-12-02 MED ORDER — BISACODYL 5 MG PO TBEC
5.0000 mg | DELAYED_RELEASE_TABLET | Freq: Once | ORAL | Status: AC
  Administered 2023-12-02: 5 mg via ORAL
  Filled 2023-12-02: qty 1

## 2023-12-02 MED ORDER — SALINE SPRAY 0.65 % NA SOLN
1.0000 | NASAL | Status: DC | PRN
  Administered 2023-12-02 – 2023-12-04 (×2): 1 via NASAL
  Filled 2023-12-02: qty 44

## 2023-12-02 MED ORDER — HYDROCODONE-ACETAMINOPHEN 5-325 MG PO TABS
1.0000 | ORAL_TABLET | Freq: Four times a day (QID) | ORAL | Status: DC | PRN
  Administered 2023-12-03 – 2023-12-10 (×15): 1 via ORAL
  Filled 2023-12-02 (×15): qty 1

## 2023-12-02 MED ORDER — TRAMADOL HCL 50 MG PO TABS
50.0000 mg | ORAL_TABLET | Freq: Two times a day (BID) | ORAL | Status: DC | PRN
  Administered 2023-12-02 – 2023-12-09 (×10): 50 mg via ORAL
  Filled 2023-12-02 (×10): qty 1

## 2023-12-02 NOTE — Progress Notes (Cosign Needed Addendum)
 Physical Therapy Treatment Patient Details Name: Curtis Hill MRN: [id number] DOB: [birth year] Today's Date: 12/02/2023   History of Present Illness 78 year old man male with admittied from SNF with gross hematuria, urinary retention and AKI.  He has had 3 admissions in the past 6 months, most recently from 7/11 to 10/25/2023 for altered mental status secondary to sepsis due to pseudomonal UTI.  He was also hospitalized with sepsis due to panniculitis from 5/24-09/13/23. Pt with history of prostate cancer, thoracic aortic ascending aneurysm, CAD, pulmonary hypertension, hyperlipidemia, hypertension, asthma, iron deficiency, and morbid obesity, right foot pain.    PT Comments  Pt received in supine, c/o pain and not agreeable to EOB/OOB activity, but agreeable to PTA assisting with supine level session for pt safety due to unsafe posture in KREG EZ Wider bed and no footboard in place at end of bed, with air bed lifted up high and pt slid down with feet toward end of bed. Pt needing up to modA for partial roll to L/R sides for pad adjustment, TotalA +2 for posterior supine scooting toward HOB with bed in trend. Pt self-limiting due to c/o pain and despite >15 minutes spent assisting pt to reposition pannus/scrotum with linens/pillows for pressure relief, pt still c/o significant pain. RN x2 notified of pt c/o pain and inability to get comfortable. Pt would benefit from change from [facility] EZ-Wider bed to [facility] Tilt bed for progress of OOB therapies (RN x3 notified today via Secure chat) and may benefit from [employer] for safe progression of pt to standing, supervising PT notified. Patient will benefit from continued inpatient follow up therapy, <3 hours/day, although he reports eagerness to set up home in a more accessible manner to facilitate eventual DC home with family assist, DME updated accordingly per discussion with supervising PT Laura K.    If plan is discharge home, recommend the following:  Two people to help with bathing/dressing/bathroom;Two people to help with walking and/or transfers   Can travel by private vehicle     No  Equipment Recommendations  [place] lift;Hospital bed;Other (comment) (if home, he would need bariatric hospital air bed, bariatric hoyer, ramp, custom electric wheelchair (pt reports wc is in process of ordering?))    Recommendations for Other Services       Precautions / Restrictions Precautions Precautions: Fall Recall of Precautions/Restrictions: Impaired Precaution/Restrictions Comments: Large pannus, painful right foot with weightbearing; watch HR; continuous bladder irrigation Restrictions Weight Bearing Restrictions Per Provider Order: No     Mobility  Bed Mobility Overal bed mobility: Needs Assistance Bed Mobility: Rolling Rolling: Min assist, Mod assist, Used rails         General bed mobility comments: Rolling L and R partially for placement and removal of pillows and adjustment of bed pads; very uncomfortable in bed despite repositioning.    Transfers                   General transfer comment: pt refusing to attempt due to discomfort    Ambulation/Gait                   Stairs             Wheelchair Mobility     Tilt Bed Tilt Bed Patient on Tilt Bed?:  (Pt has ez-wider KREG bed in his room, message sent to RN and Charge RN regarding need to order correct Tilt bed for safe progression of therapies.)  Modified Rankin (Stroke Patients Only)  Balance       Sitting balance - Comments: pt defers EOB due to persistent discomfort in supine.                                    Communication Communication Communication: No apparent difficulties  Cognition Arousal: Alert Behavior During Therapy: Restless, Flat affect   PT - Cognitive impairments: No family/caregiver present to determine baseline                       PT - Cognition Comments: Pt perseverating on  pain related to pannus and scrotum, difficult to redirect, pt able to do more than he thinks to assist with repositioning/self-supporting pannus while attempting to reposition him. Following commands: Intact      Cueing Cueing Techniques: Verbal cues, Gestural cues, Tactile cues  Exercises Other Exercises Other Exercises: supine LLE A/AAROM: hip IR/ER x10 reps, AAROM SLR x5 reps ea    General Comments General comments (skin integrity, edema, etc.): VSS on RA, pt reports discomfort throughout despite max efforts for readjusting pillows, rolling for pressure relief, trying alternate pillow or towels under his pannus and pillowcase sling under scrotum, pt reporting nothing is helping with his pain. Pt did agree to scoot up toward [facility] from trend position with +3 staff assist so that staff could safely place foot board at end of bed as pt had slid down in air bed and was at increased risk of falls without foot board to stabilize him. with HOB ~19 deg, pt able to assist with holding up pannus with LUE while PTA and at times RN assisting him to place pillows/linens to reposition pannus.scrotum to attempt to relieve his discomfort. RN notified pt may need topical pain relief at skin level as he is c/o chafing/tingling/discomfort at various areas.      Pertinent Vitals/Pain Pain Assessment Pain Assessment: Faces Faces Pain Scale: Hurts even more Pain Location: right foot, pannus, scrotum Pain Descriptors / Indicators: Discomfort, Grimacing, Guarding, Pins and needles, Stabbing Pain Intervention(s): Limited activity within patient's tolerance, Monitored during session, Premedicated before session, Repositioned, Patient requesting pain meds-RN notified    Home Living                          Prior Function            PT Goals (current goals can now be found in the care plan section) Acute Rehab PT Goals Patient Stated Goal: Less pain and to move better. PT Goal Formulation: With  patient Time For Goal Achievement: 12/14/23 Progress towards PT goals: Not progressing toward goals - comment;PT to reassess next treatment    Frequency    Min 2X/week      PT Plan      Co-evaluation              AM-PAC PT 6 Clicks Mobility   Outcome Measure  Help needed turning from your back to your side while in a flat bed without using bedrails?: Total (+2 and rail needed to roll fully) Help needed moving from lying on your back to sitting on the side of a flat bed without using bedrails?: Total Help needed moving to and from a bed to a chair (including a wheelchair)?: Total Help needed standing up from a chair using your arms (e.g., wheelchair or bedside chair)?: Total Help needed to walk in  hospital room?: Total Help needed climbing 3-5 steps with a railing? : Total 6 Click Score: 6    End of Session   Activity Tolerance: Patient limited by pain Patient left: in bed;with call bell/phone within reach;with bed alarm set;Other (comment) (bed at lowest posture, x4 rails up and footboard in place per air bed, RN notified pt may benefit from topical type pain meds for pannus or scrotal area as repositioning and pillows for pressure relief are not helping with his pain level) Nurse Communication: Mobility status;Need for lift equipment;Other (comment) (needs bed switched to Tilt bed; may need [employer] bariatric smart stand lift) PT Visit Diagnosis: Other abnormalities of gait and mobility (R26.89);Pain Pain - part of body:  (pannus, scrotum, skin around these areas)     Time: 8865-8844 PT Time Calculation (min) (ACUTE ONLY): 21 min  Charges:    $Therapeutic Activity: 8-22 mins PT General Charges $$ ACUTE PT VISIT: 1 Visit                     Silus Lanzo P., PTA Acute Rehabilitation Services Secure Chat Preferred 9a-5:30pm Office: [phone]    Connell HERO [facility] 12/02/2023, 3:02 PM

## 2023-12-02 NOTE — [place] FL2 (Signed)
 [place]  MEDICAID FL2 LEVEL OF CARE FORM     IDENTIFICATION  Patient Name: Curtis Hill Birthdate: [birth year] Sex: male Admission Date (Current Location): 11/28/2023  [facility] and [place] Number:  Producer, television/film/video and Address:  [facility], [address] [place], [place] [address]      Provider Number: [id number]  Attending Physician Name and Address:  Raenelle Donalda HERO, MD  Relative Name and Phone Number:       Current Level of Care: Hospital Recommended Level of Care: Skilled Nursing Facility Prior Approval Number:    Date Approved/Denied:   PASRR Number: [id number]  Discharge Plan: SNF    Current Diagnoses: Patient Active Problem List   Diagnosis Date Noted   Hematuria 11/29/2023   Hemorrhagic cystitis 11/29/2023   Sepsis secondary to UTI (HCC) 10/23/2023   SIRS (systemic inflammatory response syndrome) (HCC) 09/04/2023   AKI (acute kidney injury) (HCC) 09/04/2023   OSA (obstructive sleep apnea) 09/04/2023   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   Peripheral edema 12/07/2020   B12 deficiency 11/14/2020   Essential hypertension 08/15/2020   Visceral obesity 07/29/2020   Prediabetes 06/20/2020   Vitamin D deficiency 05/23/2020   Hyperglycemia 05/23/2020   Shortness of breath on exertion 05/23/2020   Debility 05/23/2020   Nutrition impaired due to imbalance of nutrients 05/23/2020   Depression screening 05/23/2020   Thoracic ascending aortic aneurysm (HCC) 11/22/2019   Panniculitis 10/31/2019   History of colonic polyps 02/27/2019   Pulmonary HTN (HCC) 11/02/2018   CAD (coronary artery disease) 10/20/2018   Aortic stenosis 07/19/2018   Hyperlipidemia LDL goal <70 07/19/2018   Hyperlipidemia    Coronary artery calcification seen on CAT scan 03/17/2018   Benign essential HTN 03/17/2018   Thoracic aortic aneurysm (TAA) (HCC) 03/17/2018   Benign neoplasm of colon 03/28/2012   BMI 70 and over, adult  (HCC) 05/18/2007   ANEMIA, IRON DEFICIENCY 05/18/2007   Leukocytosis 05/18/2007   Disease of blood and blood forming organ 05/18/2007   CARDIOMEGALY 05/18/2007   SINUSITIS, ACUTE 05/18/2007   Diaphragmatic hernia 05/18/2007   Abdominal pain 05/18/2007   EPIGASTRIC PAIN 05/18/2007   GOUT, HX OF 05/18/2007    Orientation RESPIRATION BLADDER Height & Weight     Self, Time, Situation, Place  Normal Incontinent, Indwelling catheter Weight: (!) 348 lb 5.2 oz (158 kg) Height:  5' 7 (170.2 cm)  BEHAVIORAL SYMPTOMS/MOOD NEUROLOGICAL BOWEL NUTRITION STATUS      Incontinent Diet (See dc summary)  AMBULATORY STATUS COMMUNICATION OF NEEDS Skin   Extensive Assist Verbally Other (Comment) (wound on groin; stage II on sacrum)                       Personal Care Assistance Level of Assistance  Bathing, Feeding, Dressing Bathing Assistance: Maximum assistance Feeding assistance: Limited assistance Dressing Assistance: Maximum assistance     Functional Limitations Info  Sight Sight Info: Impaired (glasses)        SPECIAL CARE FACTORS FREQUENCY  PT (By licensed PT), OT (By licensed OT)     PT Frequency: 5x/week OT Frequency: 5x/week            Contractures Contractures Info: Not present    Additional Factors Info  Code Status, Allergies Code Status Info: Full Allergies Info: Levofloxacin , Lisinopril, Prednisone            Current Medications (12/02/2023):  This is the current hospital active medication list Current Facility-Administered Medications  Medication Dose Route Frequency Provider Last Rate Last Admin   (feeding supplement) PROSource Plus liquid 30 mL  30 mL Oral BID BM Mannam, Praveen, MD   30 mL at 12/01/23 1756   acetaminophen  (TYLENOL ) tablet 650 mg  650 mg Oral Q6H PRN Eubanks, Katalina M, NP   650 mg at 11/30/23 2201   allopurinol  (ZYLOPRIM ) tablet 300 mg  300 mg Oral Daily Mannam, Praveen, MD   300 mg at 12/02/23 0918   atorvastatin  (LIPITOR) tablet 20  mg  20 mg Oral Daily Mannam, Praveen, MD   20 mg at 12/02/23 0917   ceFEPIme  (MAXIPIME ) 2 g in sodium chloride  0.9 % 100 mL IVPB  2 g Intravenous Q12H Raenelle Donalda HERO, MD 200 mL/hr at 12/02/23 0921 2 g at 12/02/23 9078   Chlorhexidine  Gluconate Cloth 2 % PADS 6 each  6 each Topical Daily Smith, Joshua C, NP   6 each at 12/02/23 0935   docusate sodium  (COLACE) capsule 100 mg  100 mg Oral BID PRN Smith, Joshua C, NP       gabapentin  (NEURONTIN ) capsule 100 mg  100 mg Oral TID Mannam, Praveen, MD   100 mg at 12/02/23 9082   Gerhardt's butt cream   Topical BID Mannam, Praveen, MD   Given at 12/02/23 0932   HYDROcodone -acetaminophen  (NORCO/VICODIN) 5-325 MG per tablet 1 tablet  1 tablet Oral Q6H PRN Hicks, Samantha B, NP   1 tablet at 12/02/23 0918   leptospermum manuka honey (MEDIHONEY) paste 1 Application  1 Application Topical Daily Mannam, Praveen, MD   1 Application at 12/01/23 0911   metoprolol  tartrate (LOPRESSOR ) tablet 12.5 mg  12.5 mg Oral BID PRN Christobal Guadalaj[place], MD       nutrition supplement (JUVEN) (JUVEN) powder packet 1 packet  1 packet Oral BID BM Mannam, Praveen, MD   1 packet at 12/02/23 0916   Oral care mouth rinse  15 mL Mouth Rinse PRN Layman Raisin, DO       polyethylene glycol (MIRALAX  / GLYCOLAX ) packet 17 g  17 g Oral Daily PRN Smith, Joshua C, NP       protein supplement (ENSURE MAX) liquid  11 oz Oral Daily Mannam, Praveen, MD   11 oz at 12/01/23 2000   senna-docusate (Senokot-S) tablet 1 tablet  1 tablet Oral BID Hicks, Samantha B, NP   1 tablet at 12/02/23 9081   sodium chloride  (OCEAN) 0.65 % nasal spray 1 spray  1 spray Each Nare PRN Ghimire, Donalda HERO, MD         Discharge Medications: Please see discharge summary for a list of discharge medications.  Relevant Imaging Results:  Relevant Lab Results:   Additional Information SSN [id number]; needs bariatric bed; has home CPAP  Soumya Colson S Izaan Kingbird, LCSW

## 2023-12-02 NOTE — Care Management Important Message (Signed)
 Important Message  Patient Details  Name: Curtis Hill MRN: [id number] Date of Birth: 03-07-1946   Important Message Given:  Yes - Medicare IM     Claretta Deed 12/02/2023, 2:20 PM

## 2023-12-02 NOTE — Progress Notes (Signed)
 OT Cancellation Note  Patient Details Name: JAKING THAYER MRN: [id number] DOB: 11/12/1945   Cancelled Treatment:    Reason Eval/Treat Not Completed: Patient declined, no reason specified  Kennth Mliss Helling 12/02/2023, 11:42 AM Mliss HERO, OTR/L Acute Rehabilitation Services Office: [phone]

## 2023-12-02 NOTE — Progress Notes (Signed)
 Total lift bed has been ordered for patient 12/02/23.

## 2023-12-02 NOTE — Progress Notes (Addendum)
 PROGRESS NOTE        PATIENT DETAILS Name: Curtis Hill Age: 78 y.o. Sex: male Date of Birth: 1945-10-05 Admit Date: 11/28/2023 Admitting Physician Harlene Na, DO ERE:Mndd, Carlin Redbird, MD  Brief Summary: Patient is a 78 y.o.  male with history of morbid obesity, prostate cancer-s/p TURBT 2012, OSA on CPAP, HTN, HLD-who presented from SNF for hematuria-upon further evaluation-patient was found to have septic shock secondary to hemorrhagic/emphysematous cystitis. Patient was admitted to the ICU-stabilized and subsequently transferred to [facility] on 8/20.    Significant events: 8/17>> to ED from SNF-hematuria-septic shock-hemorrhagic cystitis/emphysematous cystitis on CT imaging-unable to place Foley-urology consulted-underwent cystoscopy/urethral meatal dilatation and Foley catheter placement.  Admit to ICU for pressors. 8/20>> transferred to [facility]  Significant studies: 7/12>> echo: EF 60-65% 8/18>> CT abdomen/pelvis: Emphysematous/hemorrhagic cystitis.  Significant microbiology data: 7/11>> urine culture: Pseudomonas/Proteus. 8/18>> urine culture: No growth 8/18>> blood culture: No growth  Procedures: 8/18>> urethral dilatation-cystoscopy-Foley catheter placement by ED.  Consults: Urology PCCM Palliative care  Subjective: Lying comfortably in bed-denies any chest pain or shortness of breath.  Awake/alert-no complaints-no hematuria.  Objective: Vitals: Blood pressure 120/68, pulse 87, temperature 97.7 F (36.5 C), temperature source Oral, resp. rate 18, height 5' 7 (1.702 m), weight (!) 158 kg, SpO2 97%.   Exam: Gen Exam:Alert awake-not in any distress HEENT:atraumatic, normocephalic Chest: B/L clear to auscultation anteriorly CVS:S1S2 regular Abdomen: Soft-morbidly obese-has a large pannus and lower abdomen-that almost comes down to his knees. Extremities:no edema Neurology: Non focal Skin: no rash  Pertinent Labs/Radiology:    Latest  Ref Rng & Units 12/02/2023    5:08 AM 12/01/2023    8:01 AM 11/30/2023    7:13 PM  CBC  WBC 4.0 - 10.5 K/uL 10.6     Hemoglobin 13.0 - 17.0 g/dL 7.6  8.9  7.9   Hematocrit 39.0 - 52.0 % 24.4  29.7  25.9   Platelets 150 - 400 K/uL 345       Lab Results  Component Value Date   NA 141 12/02/2023   K 3.5 12/02/2023   CL 100 12/02/2023   CO2 29 12/02/2023     Assessment/Plan: Septic shock secondary to hemorrhagic and emphysematous cystitis Sepsis physiology has resolved All cultures negative so far Recent urine cultures positive for Pseudomonas/Proteus-currently on cefepime -7 days course planned.  Hemorrhagic/emphysematous cystitis See above regarding antibiotics Hematuria has resolved-quite a few bit of debris still draining in Foley catheter. Will defer timing of voiding trial to urology-difficulty placing catheter in the ED-required cystoscopy/meatal dilatation-intraoperative Foley catheter placement. Await further input from urology.  AKI Hemodynamically mediated-in the setting of septic shock AKI has resolved with treatment of underlying infectious issues.  Anemia Multifactorial-secondary to acute blood loss in the setting of hematuria-and acute/critical illness Hematuria has resolved-sepsis physiology improved-overall clinically improving Hb levels fluctuating-will continue to monitor Transfuse if Hb<7.  History of CAD Thoracic aortic aneurysm No anginal symptoms PCP to continue to follow/surveillance of aneurysm.  HTN All antihypertensives remain on hold-resume when BP is more elevated.  HLD Statin  History of pulmonary hypertension Supportive care As needed diuretics if develops volume overload  Bronchial asthma Not in exacerbation As needed bronchodilators.  OSA on CPAP Chronic hypoxic respiratory failure on 3 L of oxygen New CPAP/O2 Supportive care  GERD PPI  Neuropathy Neurontin   Gout No flare Allopurinol   Debility/deconditioning Mostly  bedbound status for almost a month-minimally mobile before that-with the help of a walker PT/OT eval SNF planned on discharge.  Nutrition Status: Nutrition Problem: Increased nutrient needs Etiology: wound healing Signs/Symptoms: estimated needs Interventions: Juven, Prostat, Refer to RD note for recommendations  Class 3 Obesity Apparently on Ozempic as an outpatient Estimated body mass index is 54.56 kg/m as calculated from the following:   Height as of this encounter: 5' 7 (1.702 m).   Weight as of this encounter: 158 kg.   Code status:   Code Status: Full Code   DVT Prophylaxis: SCDs Start: 11/29/23 0405   Family Communication: Spouse-Shirley-[phone] left VM 8/21   Disposition Plan: Status is: Inpatient Remains inpatient appropriate because: Severity of illness   Planned Discharge Destination:Skilled nursing facility   Diet: Diet Order             Diet Heart Room service appropriate? Yes with Assist; Fluid consistency: Thin  Diet effective now                     Antimicrobial agents: Anti-infectives (From admission, onward)    Start     Dose/Rate Route Frequency Ordered Stop   12/01/23 1000  ceFEPIme  (MAXIPIME ) 2 g in sodium chloride  0.9 % 100 mL IVPB        2 g 200 mL/hr over 30 Minutes Intravenous Every 12 hours 12/01/23 0750 12/08/23 0959   11/30/23 0200  cefTRIAXone  (ROCEPHIN ) 2 g in sodium chloride  0.9 % 100 mL IVPB  Status:  Discontinued        2 g 200 mL/hr over 30 Minutes Intravenous Every 24 hours 11/29/23 0413 11/29/23 0459   11/29/23 2300  ceFEPIme  (MAXIPIME ) 2 g in sodium chloride  0.9 % 100 mL IVPB  Status:  Discontinued        2 g 200 mL/hr over 30 Minutes Intravenous Every 24 hours 11/29/23 0459 12/01/23 0750   11/29/23 0000  ceFEPIme  (MAXIPIME ) 2 g in sodium chloride  0.9 % 100 mL IVPB        2 g 200 mL/hr over 30 Minutes Intravenous  Once 11/28/23 2349 11/29/23 0122        MEDICATIONS: Scheduled Meds:  (feeding supplement)  PROSource Plus  30 mL Oral BID BM   allopurinol   300 mg Oral Daily   atorvastatin   20 mg Oral Daily   Chlorhexidine  Gluconate Cloth  6 each Topical Daily   gabapentin   100 mg Oral TID   Gerhardt's butt cream   Topical BID   leptospermum manuka honey  1 Application Topical Daily   nutrition supplement (JUVEN)  1 packet Oral BID BM   Ensure Max Protein  11 oz Oral Daily   senna-docusate  1 tablet Oral BID   Continuous Infusions:  ceFEPime  (MAXIPIME ) IV 2 g (12/02/23 0921)   PRN Meds:.acetaminophen , docusate sodium , HYDROcodone -acetaminophen , metoprolol  tartrate, mouth rinse, polyethylene glycol, sodium chloride    I have personally reviewed following labs and imaging studies  LABORATORY DATA: CBC: Recent Labs  Lab 11/28/23 2137 11/29/23 0448 11/29/23 0926 11/30/23 0339 11/30/23 0920 11/30/23 1913 12/01/23 0801 12/02/23 0508  WBC 26.0* 25.7*  --  14.1*  --   --   --  10.6*  NEUTROABS 23.6*  --   --   --   --   --   --   --   HGB 8.6* 7.6*   < > 7.9* 7.7* 7.9* 8.9* 7.6*  HCT 29.5* 25.9*   < > 25.5* 25.2* 25.9* 29.7* 24.4*  MCV 89.9 89.6  --  87.9  --   --   --  88.7  PLT 524* 466*  --  365  --   --   --  345   < > = values in this interval not displayed.    Basic Metabolic Panel: Recent Labs  Lab 11/29/23 0448 11/29/23 0926 11/29/23 1035 11/29/23 1828 11/29/23 2039 11/30/23 0339 12/01/23 0317 12/02/23 0508  NA 131* 131*   < > 137 137 139 138 141  K 6.1* 5.6*   < > 4.7 5.1 4.1 3.7 3.5  CL 94* 95*  --  98 99 102 99 100  CO2 26 25  --  28 28 27 28 29   GLUCOSE 129* 127*  --  108* 102* 96 115* 106*  BUN 108* 103*  --  94* 92* 80* 70* 54*  CREATININE 4.54* 3.90*  --  3.18* 3.14* 2.60* 1.82* 1.21  CALCIUM  8.8* 8.7*  --  8.8* 8.9 8.8* 8.7* 8.5*  MG 2.0  --   --   --   --  1.8 1.6* 1.7  PHOS 5.2* 5.0*  --   --   --  4.7* 3.4  --    < > = values in this interval not displayed.    GFR: Estimated Creatinine Clearance: 74.4 mL/min (by C-G formula based on SCr of 1.21  mg/dL).  Liver Function Tests: Recent Labs  Lab 11/28/23 2137 11/29/23 0448  AST 42* 35  ALT 25 24  ALKPHOS 125 122  BILITOT 0.5 0.5  PROT 8.9* 7.8  ALBUMIN  2.2* 2.0*   No results for input(s): LIPASE, AMYLASE in the last 168 hours. No results for input(s): AMMONIA in the last 168 hours.  Coagulation Profile: Recent Labs  Lab 11/29/23 0448  INR 1.1    Cardiac Enzymes: No results for input(s): CKTOTAL, CKMB, CKMBINDEX, TROPONINI in the last 168 hours.  BNP (last 3 results) No results for input(s): PROBNP in the last 8760 hours.  Lipid Profile: No results for input(s): CHOL, HDL, LDLCALC, TRIG, CHOLHDL, LDLDIRECT in the last 72 hours.  Thyroid  Function Tests: No results for input(s): TSH, T4TOTAL, FREET4, T3FREE, THYROIDAB in the last 72 hours.  Anemia Panel: No results for input(s): VITAMINB12, FOLATE, FERRITIN, TIBC, IRON, RETICCTPCT in the last 72 hours.  Urine analysis:    Component Value Date/Time   COLORURINE RED (A) 11/29/2023 0538   APPEARANCEUR TURBID (A) 11/29/2023 0538   LABSPEC  11/29/2023 0538    TEST NOT REPORTED DUE TO COLOR INTERFERENCE OF URINE PIGMENT   PHURINE  11/29/2023 0538    TEST NOT REPORTED DUE TO COLOR INTERFERENCE OF URINE PIGMENT   GLUCOSEU (A) 11/29/2023 0538    TEST NOT REPORTED DUE TO COLOR INTERFERENCE OF URINE PIGMENT   HGBUR (A) 11/29/2023 0538    TEST NOT REPORTED DUE TO COLOR INTERFERENCE OF URINE PIGMENT   BILIRUBINUR (A) 11/29/2023 0538    TEST NOT REPORTED DUE TO COLOR INTERFERENCE OF URINE PIGMENT   KETONESUR (A) 11/29/2023 0538    TEST NOT REPORTED DUE TO COLOR INTERFERENCE OF URINE PIGMENT   PROTEINUR (A) 11/29/2023 0538    TEST NOT REPORTED DUE TO COLOR INTERFERENCE OF URINE PIGMENT   UROBILINOGEN 1.0 02/18/2007 1617   NITRITE (A) 11/29/2023 0538    TEST NOT REPORTED DUE TO COLOR INTERFERENCE OF URINE PIGMENT   LEUKOCYTESUR (A) 11/29/2023 0538    TEST NOT REPORTED  DUE TO COLOR INTERFERENCE OF URINE PIGMENT    Sepsis Labs:  Lactic Acid, Venous    Component Value Date/Time   LATICACIDVEN 1.0 11/29/2023 0926    MICROBIOLOGY: Recent Results (from the past 240 hours)  Blood culture (routine x 2)     Status: None (Preliminary result)   Collection Time: 11/29/23 12:30 AM   Specimen: BLOOD  Result Value Ref Range Status   Specimen Description BLOOD LEFT ANTECUBITAL  Final   Special Requests   Final    BOTTLES DRAWN AEROBIC AND ANAEROBIC Blood Culture results may not be optimal due to an inadequate volume of blood received in culture bottles   Culture   Final    NO GROWTH 3 DAYS Performed at [facility], [address] [place], [place] [address]    Report Status PENDING  Incomplete  Blood culture (routine x 2)     Status: None (Preliminary result)   Collection Time: 11/29/23 12:35 AM   Specimen: BLOOD  Result Value Ref Range Status   Specimen Description BLOOD LEFT UPPER ARM  Final   Special Requests   Final    BOTTLES DRAWN AEROBIC AND ANAEROBIC Blood Culture results may not be optimal due to an inadequate volume of blood received in culture bottles   Culture   Final    NO GROWTH 3 DAYS Performed at [facility], [address], [place], [place] [address]    Report Status PENDING  Incomplete  MRSA Next Gen by PCR, Nasal     Status: None   Collection Time: 11/29/23  5:30 AM   Specimen: Urine, Catheterized; Nasal Swab  Result Value Ref Range Status   MRSA by PCR Next Gen NOT DETECTED NOT DETECTED Final    Comment: (NOTE) The GeneXpert MRSA Assay (FDA approved for NASAL specimens only), is one component of a comprehensive MRSA colonization surveillance program. It is not intended to diagnose MRSA infection nor to guide or monitor treatment for MRSA infections. Test performance is not FDA approved in patients less than 52 years old. Performed at [facility], [address] [place], [place] [address]   Urine Culture      Status: None   Collection Time: 11/29/23  5:38 AM   Specimen: Urine, Random  Result Value Ref Range Status   Specimen Description URINE, RANDOM  Final   Special Requests NONE Reflexed from [id number]  Final   Culture   Final    NO GROWTH Performed at [facility], [address], [place], [place] [address]    Report Status 11/30/2023 FINAL  Final    RADIOLOGY STUDIES/RESULTS: No results found.   LOS: 3 days   Donalda Applebaum, MD  [facility]    To contact the attending provider between 7A-7P or the covering provider during after hours 7P-7A, please log into the web site [email or web address] and access using universal [place] password for that web site. If you do not have the password, please call the hospital operator.  12/02/2023, 11:17 AM

## 2023-12-02 NOTE — Plan of Care (Signed)

## 2023-12-02 NOTE — Progress Notes (Signed)
     Subjective: CBI clamped overnight.  No hematuria this morning.  Reviewed case and plan with hospitalist, nursing, and patient at bedside.  Urine remains clear yellow today. Objective: Vital signs in last 24 hours: Temp:  [97.7 F (36.5 C)-98.8 F (37.1 C)] 97.9 F (36.6 C) (08/21 1247) Pulse Rate:  [87-119] 113 (08/21 1247) Resp:  [18-22] 18 (08/21 0350) BP: (80-132)/(61-88) 127/71 (08/21 1247) SpO2:  [95 %-100 %] 97 % (08/21 9187)  Assessment/Plan: # Hematuria # Emphysematous cystitis # Morbid obesity  CBI clamped on the morning of 11/30/2023.  Urine still clear the morning of 8/20.  Discontinue CBI.  NGTD on UCx but Pseudomonas and Proteus positive on 10/22/2023 culture and sensitivity.  Would recommend at least one week of treatment for emphysematous cystitis.  Consider repeat imaging after completing ABX.    If patient is still in hospital we will repeat CT pelvis on Monday.  This could be completed outpatient if necessary, but patient insists that he is incapable of being transported. Consider a.m. voiding trial afterwards.  External beam radiation and radioactive seed placement 12/11/2010.  Urology will follow.  Intake/Output from previous day: 08/20 0701 - 08/21 0700 In: 730.5 [P.O.:480; IV Piggyback:250.5] Out: 1950 [Urine:1950]  Intake/Output this shift: Total I/O In: -  Out: 650 [Urine:650]  Physical Exam:  General: Alert and oriented CV: No cyanosis Lungs: equal chest rise Abdomen: Soft, NTND, no rebound or guarding Gu: 38f three-way in place with CBI clamped  Lab Results: Recent Labs    11/30/23 1913 12/01/23 0801 12/02/23 0508  HGB 7.9* 8.9* 7.6*  HCT 25.9* 29.7* 24.4*   BMET Recent Labs    11/30/23 0339 11/30/23 0920 12/01/23 0317 12/01/23 0801 12/02/23 0508  NA 139  --  138  --  141  K 4.1  --  3.7  --  3.5  CL 102  --  99  --  100  CO2 27  --  28  --  29  GLUCOSE 96  --  115*  --  106*  BUN 80*  --  70*  --  54*  CREATININE 2.60*   --  1.82*  --  1.21  CALCIUM  8.8*  --  8.7*  --  8.5*  HGB 7.9*   < >  --  8.9* 7.6*  WBC 14.1*  --   --   --  10.6*   < > = values in this interval not displayed.     Studies/Results: No results found.     LOS: 3 days   Ole Bourdon, NP [facility] Pager: [phone]  12/02/2023, 1:49 PM

## 2023-12-03 ENCOUNTER — Ambulatory Visit: Admitting: Podiatry

## 2023-12-03 DIAGNOSIS — N3091 Cystitis, unspecified with hematuria: Secondary | ICD-10-CM | POA: Diagnosis not present

## 2023-12-03 DIAGNOSIS — E875 Hyperkalemia: Secondary | ICD-10-CM | POA: Diagnosis not present

## 2023-12-03 DIAGNOSIS — A419 Sepsis, unspecified organism: Secondary | ICD-10-CM | POA: Diagnosis not present

## 2023-12-03 DIAGNOSIS — N179 Acute kidney failure, unspecified: Secondary | ICD-10-CM | POA: Diagnosis not present

## 2023-12-03 LAB — BASIC METABOLIC PANEL WITH GFR
Anion gap: 9 (ref 5–15)
BUN: 46 mg/dL — ABNORMAL HIGH (ref 8–23)
CO2: 27 mmol/L (ref 22–32)
Calcium: 8.6 mg/dL — ABNORMAL LOW (ref 8.9–10.3)
Chloride: 103 mmol/L (ref 98–111)
Creatinine, Ser: 1.07 mg/dL (ref 0.61–1.24)
GFR, Estimated: >60 mL/min (ref >60)
Glucose, Bld: 98 mg/dL (ref 70–99)
Potassium: 3.7 mmol/L (ref 3.5–5.1)
Sodium: 139 mmol/L (ref 135–145)

## 2023-12-03 LAB — CBC
HCT: 25.2 % — ABNORMAL LOW (ref 39.0–52.0)
Hemoglobin: 7.7 g/dL — ABNORMAL LOW (ref 13.0–17.0)
MCH: 27.5 pg (ref 26.0–34.0)
MCHC: 30.6 g/dL (ref 30.0–36.0)
MCV: 90 fL (ref 80.0–100.0)
Platelets: 358 K/uL (ref 150–400)
RBC: 2.8 MIL/uL — ABNORMAL LOW (ref 4.22–5.81)
RDW: 16.7 % — ABNORMAL HIGH (ref 11.5–15.5)
WBC: 10.7 K/uL — ABNORMAL HIGH (ref 4.0–10.5)
nRBC: 0 % (ref 0.0–0.2)

## 2023-12-03 NOTE — Progress Notes (Signed)
 Occupational Therapy Treatment Patient Details Name: Curtis Hill MRN: [id number] DOB: 05/01/1945 Today's Date: 12/03/2023   History of present illness 78 year old man male with admittied from SNF with gross hematuria, urinary retention and AKI.  He has had 3 admissions in the past 6 months, most recently from 7/11 to 10/25/2023 for altered mental status secondary to sepsis due to pseudomonal UTI.  He was also hospitalized with sepsis due to panniculitis from 5/24-09/13/23. Pt with history of prostate cancer, thoracic aortic ascending aneurysm, CAD, pulmonary hypertension, hyperlipidemia, hypertension, asthma, iron deficiency, and morbid obesity, right foot pain.   OT comments  Patient received in supine and required encouragement to participate due to patient states it takes too long to get back comfortable after moving to EOB. Patient was instructed on bed mobility and was able to get to EOB with mod assist +2 and increased time. Once on EOB patient was heavy reliance on BUE support but able to maintain balance with one extremity support for grooming tasks. Patient tolerated approximately 5 minutes on EOB before returning to supine with max assist +2.  Patient required increased time and multiple rolling in bed to provided comfort while supine.  Patient will benefit from continued inpatient follow up therapy, <3 hours/day.  Acute OT to continue to follow to address established goals to facilitate DC to next venue of care.        If plan is discharge home, recommend the following:  Two people to help with walking and/or transfers;Two people to help with bathing/dressing/bathroom;Help with stairs or ramp for entrance;Assist for transportation   Equipment Recommendations  Other (comment) (TBD next venue of care)    Recommendations for Other Services      Precautions / Restrictions Precautions Precautions: Fall Recall of Precautions/Restrictions: Impaired Precaution/Restrictions Comments: Large  pannus, painful right foot with weightbearing; watch HR; continuous bladder irrigation Restrictions Weight Bearing Restrictions Per Provider Order: No       Mobility Bed Mobility Overal bed mobility: Needs Assistance Bed Mobility: Rolling, Supine to Sit, Sit to Supine Rolling: Min assist, Used rails, Mod assist, Max assist   Supine to sit: Mod assist, +2 for physical assistance Sit to supine: +2 for safety/equipment, +2 for physical assistance, Max assist   General bed mobility comments: patient rolls to right with min/mod assist and max assist to roll to left. max to total assist for sit to supine due to assistance needed with trunk and BLEs    Transfers                   General transfer comment: not attempted     Balance Overall balance assessment: Needs assistance Sitting-balance support: Bilateral upper extremity supported, Single extremity supported Sitting balance-Leahy Scale: Poor Sitting balance - Comments: reliant on at least one extremity support when EOB, mostly with RUE Postural control: Posterior lean                                 ADL either performed or assessed with clinical judgement   ADL Overall ADL's : Needs assistance/impaired     Grooming: Wash/dry hands;Wash/dry face;Brushing hair;Minimal assistance;Sitting Grooming Details (indicate cue type and reason): able to wash hands and face seated on EOB and min assist to complete combing hair                               General  ADL Comments: focused on sitting on EOB    Extremity/Trunk Assessment              Vision       Perception     Praxis     Communication Communication Communication: No apparent difficulties   Cognition Arousal: Alert Behavior During Therapy: Flat affect Cognition: No apparent impairments                               Following commands: Intact        Cueing   Cueing Techniques: Verbal cues, Gestural cues,  Tactile cues  Exercises      Shoulder Instructions       General Comments      Pertinent Vitals/ Pain       Pain Assessment Pain Assessment: Faces Faces Pain Scale: Hurts even more Pain Location: right foot, pannus, scrotum Pain Descriptors / Indicators: Discomfort, Grimacing, Guarding, Stabbing Pain Intervention(s): Limited activity within patient's tolerance, Monitored during session, Repositioned  Home Living                                          Prior Functioning/Environment              Frequency  Min 1X/week        Progress Toward Goals  OT Goals(current goals can now be found in the care plan section)  Progress towards OT goals: Progressing toward goals  Acute Rehab OT Goals Patient Stated Goal: to be able to walk again OT Goal Formulation: With patient Time For Goal Achievement: 12/14/23 Potential to Achieve Goals: Fair ADL Goals Pt Will Perform Grooming: with set-up;sitting (Unsupported EOB for 3 tasks without LOB) Pt Will Perform Upper Body Bathing: with set-up;sitting (unsupported EOB) Pt Will Perform Lower Body Bathing: with max assist;sitting/lateral leans;bed level;with adaptive equipment (sit to supine with AE for reaching BLEs) Additional ADL Goal #1: Pt will complete supine to sit with mod assist and HOB no greater than 45 degrees in preparation for selfcare tasks or sit to stand attempts. Additional ADL Goal #2: Pt will complete sit to stand from elevated EOB with max +2 in preparation for LB selfcare tasks and functional transfers.  Plan      Co-evaluation    PT/OT/SLP Co-Evaluation/Treatment: Yes Reason for Co-Treatment: Complexity of the patient's impairments (multi-system involvement);For patient/therapist safety;To address functional/ADL transfers   OT goals addressed during session: ADL's and self-care      AM-PAC OT 6 Clicks Daily Activity     Outcome Measure   Help from another person eating meals?:  None Help from another person taking care of personal grooming?: A Little Help from another person toileting, which includes using toliet, bedpan, or urinal?: Total Help from another person bathing (including washing, rinsing, drying)?: Total Help from another person to put on and taking off regular upper body clothing?: A Little Help from another person to put on and taking off regular lower body clothing?: Total 6 Click Score: 13    End of Session    OT Visit Diagnosis: Muscle weakness (generalized) (M62.81);Pain;Unsteadiness on feet (R26.81);Other abnormalities of gait and mobility (R26.89);Dizziness and giddiness (R42) Pain - Right/Left: Right Pain - part of body: Ankle and joints of foot   Activity Tolerance Patient limited by pain   Patient Left in bed;with call bell/phone within reach  Nurse Communication Mobility status        Time: 8797-8744 OT Time Calculation (min): 53 min  Charges: OT General Charges $OT Visit: 1 Visit OT Treatments $Self Care/Home Management : 8-22 mins $Therapeutic Activity: 8-22 mins  Dick Hill, OTA Acute Rehabilitation Services  Office [phone]   Curtis Hill 12/03/2023, 1:50 PM

## 2023-12-03 NOTE — Progress Notes (Addendum)
 PROGRESS NOTE        PATIENT DETAILS Name: Curtis Hill Age: 78 y.o. Sex: male Date of Birth: 1946-03-08 Admit Date: 11/28/2023 Admitting Physician Harlene Na, DO ERE:Mndd, Carlin Redbird, MD  Brief Summary: Patient is a 78 y.o.  male with history of morbid obesity, prostate cancer-s/p TURBT 2012, OSA on CPAP, HTN, HLD-who presented from SNF for hematuria-upon further evaluation-patient was found to have septic shock secondary to hemorrhagic/emphysematous cystitis. Patient was admitted to the ICU-stabilized and subsequently transferred to [facility] on 8/20.    Significant events: 8/17>> to ED from SNF-hematuria-septic shock-hemorrhagic cystitis/emphysematous cystitis on CT imaging-unable to place Foley-urology consulted-underwent cystoscopy/urethral meatal dilatation and Foley catheter placement.  Admit to ICU for pressors. 8/20>> transferred to [facility]  Significant studies: 7/12>> echo: EF 60-65% 8/18>> CT abdomen/pelvis: Emphysematous/hemorrhagic cystitis.  Significant microbiology data: 7/11>> urine culture: Pseudomonas/Proteus. 8/18>> urine culture: No growth 8/18>> blood culture: No growth  Procedures: 8/18>> urethral dilatation-cystoscopy-Foley catheter placement by ED.  Consults: Urology PCCM Palliative care  Subjective: No major issues overnight-lying comfortably in bed.  Objective: Vitals: Blood pressure 127/82, pulse (!) 103, temperature 98.3 F (36.8 C), temperature source Oral, resp. rate 18, height 5' 7 (1.702 m), weight (!) 158 kg, SpO2 93%.   Exam: Awake/alert Chronically frail appearing Not in any distress Chest: Clear to auscultation Abdomen: Soft nontender-large pannus in the lower abdomen that almost comes down to his knees Nonfocal exam but does have generalized weakness.  Pertinent Labs/Radiology:    Latest Ref Rng & Units 12/03/2023    5:49 AM 12/02/2023    5:08 AM 12/01/2023    8:01 AM  CBC  WBC 4.0 - 10.5 K/uL 10.7   10.6    Hemoglobin 13.0 - 17.0 g/dL 7.7  7.6  8.9   Hematocrit 39.0 - 52.0 % 25.2  24.4  29.7   Platelets 150 - 400 K/uL 358  345      Lab Results  Component Value Date   NA 139 12/03/2023   K 3.7 12/03/2023   CL 103 12/03/2023   CO2 27 12/03/2023     Assessment/Plan: Septic shock secondary to hemorrhagic and emphysematous cystitis Sepsis physiology has resolved All cultures negative so far Recent urine cultures positive for Pseudomonas/Proteus-currently on cefepime -7 days course planned-if 8/23.  Hemorrhagic/emphysematous cystitis See above regarding antibiotics Hematuria has resolved Urology recommending repeat CT on 8/25-and then decide whether to remove Foley catheter or not.  AKI Hemodynamically mediated-in the setting of septic shock AKI has resolved with treatment of underlying infectious issues.  Anemia Multifactorial-secondary to acute blood loss in the setting of hematuria-and acute/critical illness Hematuria has resolved-sepsis physiology improved-overall clinically improving Hb levels fluctuating-will continue to monitor Transfuse if Hb<7.  History of CAD Thoracic aortic aneurysm No anginal symptoms PCP to continue to follow/surveillance of aneurysm.  HTN All antihypertensives remain on hold-resume when BP is more elevated.  HLD Statin  History of pulmonary hypertension Supportive care As needed diuretics if develops volume overload  Bronchial asthma Not in exacerbation As needed bronchodilators.  OSA on CPAP Chronic hypoxic respiratory failure on 3 L of oxygen New CPAP/O2 Supportive care  GERD PPI  Neuropathy Neurontin   Gout No flare Allopurinol   Debility/deconditioning Mostly bedbound status for almost a month-minimally mobile before that-with the help of a walker PT/OT eval SNF planned on discharge.  Nutrition Status: Nutrition Problem: Increased nutrient needs  Etiology: wound healing Signs/Symptoms: estimated  needs Interventions: Juven, Prostat, Refer to RD note for recommendations  Class 3 Obesity Apparently on Ozempic as an outpatient Estimated body mass index is 54.56 kg/m as calculated from the following:   Height as of this encounter: 5' 7 (1.702 m).   Weight as of this encounter: 158 kg.   Code status:   Code Status: Full Code   DVT Prophylaxis: SCDs Start: 11/29/23 0405   Family Communication: Spouse-Shirley-[phone] left VM 8/21,8/22   Disposition Plan: Status is: Inpatient Remains inpatient appropriate because: Severity of illness   Planned Discharge Destination:Skilled nursing facility   Diet: Diet Order             Diet Heart Room service appropriate? Yes with Assist; Fluid consistency: Thin  Diet effective now                     Antimicrobial agents: Anti-infectives (From admission, onward)    Start     Dose/Rate Route Frequency Ordered Stop   12/01/23 1000  ceFEPIme  (MAXIPIME ) 2 g in sodium chloride  0.9 % 100 mL IVPB        2 g 200 mL/hr over 30 Minutes Intravenous Every 12 hours 12/01/23 0750 12/08/23 0959   11/30/23 0200  cefTRIAXone  (ROCEPHIN ) 2 g in sodium chloride  0.9 % 100 mL IVPB  Status:  Discontinued        2 g 200 mL/hr over 30 Minutes Intravenous Every 24 hours 11/29/23 0413 11/29/23 0459   11/29/23 2300  ceFEPIme  (MAXIPIME ) 2 g in sodium chloride  0.9 % 100 mL IVPB  Status:  Discontinued        2 g 200 mL/hr over 30 Minutes Intravenous Every 24 hours 11/29/23 0459 12/01/23 0750   11/29/23 0000  ceFEPIme  (MAXIPIME ) 2 g in sodium chloride  0.9 % 100 mL IVPB        2 g 200 mL/hr over 30 Minutes Intravenous  Once 11/28/23 2349 11/29/23 0122        MEDICATIONS: Scheduled Meds:  (feeding supplement) PROSource Plus  30 mL Oral BID BM   allopurinol   300 mg Oral Daily   atorvastatin   20 mg Oral Daily   Chlorhexidine  Gluconate Cloth  6 each Topical Daily   gabapentin   100 mg Oral TID   Gerhardt's butt cream   Topical BID    leptospermum manuka honey  1 Application Topical Daily   nutrition supplement (JUVEN)  1 packet Oral BID BM   Ensure Max Protein  11 oz Oral Daily   senna-docusate  1 tablet Oral BID   Continuous Infusions:  ceFEPime  (MAXIPIME ) IV 2 g (12/03/23 0919)   PRN Meds:.docusate sodium , HYDROcodone -acetaminophen , metoprolol  tartrate, mouth rinse, polyethylene glycol, sodium chloride , traMADol    I have personally reviewed following labs and imaging studies  LABORATORY DATA: CBC: Recent Labs  Lab 11/28/23 2137 11/29/23 0448 11/29/23 0926 11/30/23 0339 11/30/23 0920 11/30/23 1913 12/01/23 0801 12/02/23 0508 12/03/23 0549  WBC 26.0* 25.7*  --  14.1*  --   --   --  10.6* 10.7*  NEUTROABS 23.6*  --   --   --   --   --   --   --   --   HGB 8.6* 7.6*   < > 7.9* 7.7* 7.9* 8.9* 7.6* 7.7*  HCT 29.5* 25.9*   < > 25.5* 25.2* 25.9* 29.7* 24.4* 25.2*  MCV 89.9 89.6  --  87.9  --   --   --  88.7 90.0  PLT 524* 466*  --  365  --   --   --  345 358   < > = values in this interval not displayed.    Basic Metabolic Panel: Recent Labs  Lab 11/29/23 0448 11/29/23 0926 11/29/23 1035 11/29/23 2039 11/30/23 0339 12/01/23 0317 12/02/23 0508 12/03/23 0549  NA 131* 131*   < > 137 139 138 141 139  K 6.1* 5.6*   < > 5.1 4.1 3.7 3.5 3.7  CL 94* 95*   < > 99 102 99 100 103  CO2 26 25   < > 28 27 28 29 27   GLUCOSE 129* 127*   < > 102* 96 115* 106* 98  BUN 108* 103*   < > 92* 80* 70* 54* 46*  CREATININE 4.54* 3.90*   < > 3.14* 2.60* 1.82* 1.21 1.07  CALCIUM  8.8* 8.7*   < > 8.9 8.8* 8.7* 8.5* 8.6*  MG 2.0  --   --   --  1.8 1.6* 1.7  --   PHOS 5.2* 5.0*  --   --  4.7* 3.4  --   --    < > = values in this interval not displayed.    GFR: Estimated Creatinine Clearance: 84.1 mL/min (by C-G formula based on SCr of 1.07 mg/dL).  Liver Function Tests: Recent Labs  Lab 11/28/23 2137 11/29/23 0448  AST 42* 35  ALT 25 24  ALKPHOS 125 122  BILITOT 0.5 0.5  PROT 8.9* 7.8  ALBUMIN  2.2* 2.0*   No  results for input(s): LIPASE, AMYLASE in the last 168 hours. No results for input(s): AMMONIA in the last 168 hours.  Coagulation Profile: Recent Labs  Lab 11/29/23 0448  INR 1.1    Cardiac Enzymes: No results for input(s): CKTOTAL, CKMB, CKMBINDEX, TROPONINI in the last 168 hours.  BNP (last 3 results) No results for input(s): PROBNP in the last 8760 hours.  Lipid Profile: No results for input(s): CHOL, HDL, LDLCALC, TRIG, CHOLHDL, LDLDIRECT in the last 72 hours.  Thyroid  Function Tests: No results for input(s): TSH, T4TOTAL, FREET4, T3FREE, THYROIDAB in the last 72 hours.  Anemia Panel: No results for input(s): VITAMINB12, FOLATE, FERRITIN, TIBC, IRON, RETICCTPCT in the last 72 hours.  Urine analysis:    Component Value Date/Time   COLORURINE RED (A) 11/29/2023 0538   APPEARANCEUR TURBID (A) 11/29/2023 0538   LABSPEC  11/29/2023 0538    TEST NOT REPORTED DUE TO COLOR INTERFERENCE OF URINE PIGMENT   PHURINE  11/29/2023 0538    TEST NOT REPORTED DUE TO COLOR INTERFERENCE OF URINE PIGMENT   GLUCOSEU (A) 11/29/2023 0538    TEST NOT REPORTED DUE TO COLOR INTERFERENCE OF URINE PIGMENT   HGBUR (A) 11/29/2023 0538    TEST NOT REPORTED DUE TO COLOR INTERFERENCE OF URINE PIGMENT   BILIRUBINUR (A) 11/29/2023 0538    TEST NOT REPORTED DUE TO COLOR INTERFERENCE OF URINE PIGMENT   KETONESUR (A) 11/29/2023 0538    TEST NOT REPORTED DUE TO COLOR INTERFERENCE OF URINE PIGMENT   PROTEINUR (A) 11/29/2023 0538    TEST NOT REPORTED DUE TO COLOR INTERFERENCE OF URINE PIGMENT   UROBILINOGEN 1.0 02/18/2007 1617   NITRITE (A) 11/29/2023 0538    TEST NOT REPORTED DUE TO COLOR INTERFERENCE OF URINE PIGMENT   LEUKOCYTESUR (A) 11/29/2023 0538    TEST NOT REPORTED DUE TO COLOR INTERFERENCE OF URINE PIGMENT    Sepsis Labs: Lactic Acid, Venous    Component Value Date/Time   LATICACIDVEN  1.0 11/29/2023 0926    MICROBIOLOGY: Recent Results  (from the past 240 hours)  Blood culture (routine x 2)     Status: None (Preliminary result)   Collection Time: 11/29/23 12:30 AM   Specimen: BLOOD  Result Value Ref Range Status   Specimen Description BLOOD LEFT ANTECUBITAL  Final   Special Requests   Final    BOTTLES DRAWN AEROBIC AND ANAEROBIC Blood Culture results may not be optimal due to an inadequate volume of blood received in culture bottles   Culture   Final    NO GROWTH 4 DAYS Performed at [facility], [address] [place], [place] [address]    Report Status PENDING  Incomplete  Blood culture (routine x 2)     Status: None (Preliminary result)   Collection Time: 11/29/23 12:35 AM   Specimen: BLOOD  Result Value Ref Range Status   Specimen Description BLOOD LEFT UPPER ARM  Final   Special Requests   Final    BOTTLES DRAWN AEROBIC AND ANAEROBIC Blood Culture results may not be optimal due to an inadequate volume of blood received in culture bottles   Culture   Final    NO GROWTH 4 DAYS Performed at [facility], [address] [place], [place] [address]    Report Status PENDING  Incomplete  MRSA Next Gen by PCR, Nasal     Status: None   Collection Time: 11/29/23  5:30 AM   Specimen: Urine, Catheterized; Nasal Swab  Result Value Ref Range Status   MRSA by PCR Next Gen NOT DETECTED NOT DETECTED Final    Comment: (NOTE) The GeneXpert MRSA Assay (FDA approved for NASAL specimens only), is one component of a comprehensive MRSA colonization surveillance program. It is not intended to diagnose MRSA infection nor to guide or monitor treatment for MRSA infections. Test performance is not FDA approved in patients less than 4 years old. Performed at [facility], [address], [place], [place] [address]   Urine Culture     Status: None   Collection Time: 11/29/23  5:38 AM   Specimen: Urine, Random  Result Value Ref Range Status   Specimen Description URINE, RANDOM  Final   Special Requests NONE  Reflexed from [id number]  Final   Culture   Final    NO GROWTH Performed at [facility], [address], [place], [place] [address]    Report Status 11/30/2023 FINAL  Final    RADIOLOGY STUDIES/RESULTS: No results found.   LOS: 4 days   Donalda Applebaum, MD  [facility]    To contact the attending provider between 7A-7P or the covering provider during after hours 7P-7A, please log into the web site [email or web address] and access using universal [place] password for that web site. If you do not have the password, please call the hospital operator.  12/03/2023, 11:59 AM

## 2023-12-03 NOTE — TOC Progression Note (Signed)
 Transition of Care [facility]) - Progression Note    Patient Details  Name: Curtis Hill MRN: [id number] Date of Birth: [birth year]  Transition of Care [facility]) CM/SW Contact  Inocente GORMAN Kindle, LCSW Phone Number: 12/03/2023, 12:54 PM  Clinical Narrative:    CSW spoke with patient and provided SNF bed offers and Medicare ratings list in the event he does not want to return to [facility]. CSW answered questions about home health services. He requested CSW speak with his wife as well.  CSW spoke with patient's spouse and securely emailed SNF bed offers and ratings to her at [email or web address] CSW answered questions about home health and she reported that unfortunately home health would not be sufficient to help patient at this time. She will review the SNF list.    Expected Discharge Plan: Skilled Nursing Facility Barriers to Discharge: Continued Medical Work up               Expected Discharge Plan and Services                                               Social Drivers of Health (SDOH) Interventions SDOH Screenings   Food Insecurity: No Food Insecurity (11/30/2023)  Housing: Low Risk  (11/30/2023)  Transportation Needs: Unmet Transportation Needs (11/30/2023)  Utilities: Not At Risk (11/30/2023)  Depression (PHQ2-9): Medium Risk (05/23/2020)  Social Connections: Moderately Integrated (11/30/2023)  Tobacco Use: Low Risk  (11/28/2023)    Readmission Risk Interventions     No data to display

## 2023-12-03 NOTE — Progress Notes (Signed)
 Physical Therapy Treatment Patient Details Name: Curtis Hill MRN: [id number] DOB: [birth year] Today's Date: 12/03/2023   History of Present Illness 78 year old man male with admittied from SNF with gross hematuria, urinary retention and AKI.  He has had 3 admissions in the past 6 months, most recently from 7/11 to 10/25/2023 for altered mental status secondary to sepsis due to pseudomonal UTI.  He was also hospitalized with sepsis due to panniculitis from 5/24-09/13/23. Pt with history of prostate cancer, thoracic aortic ascending aneurysm, CAD, pulmonary hypertension, hyperlipidemia, hypertension, asthma, iron deficiency, and morbid obesity, right foot pain.    PT Comments  Pt admitted with above diagnosis. Pt was able to sit EOB x 10 min with CGA for most of sitting until fatigued. Pt did some exercises as well and able to comb his hair. Pt very particular once assisted back to bed to get positioned in bed.  Took about 20 min to place pillows and get his pannus comfortable.   Pt currently with functional limitations due to the deficits listed below (see PT Problem List). Pt will benefit from acute skilled PT to increase their independence and safety with mobility to allow discharge.       If plan is discharge home, recommend the following: Two people to help with bathing/dressing/bathroom;Two people to help with walking and/or transfers   Can travel by private vehicle     No  Equipment Recommendations  [place] lift;Hospital bed;Other (comment) (if home, he would need bariatric hospital air bed, bariatric hoyer, ramp, custom electric wheelchair (pt reports w[facility] is in process of ordering?))    Recommendations for Other Services       Precautions / Restrictions Precautions Precautions: Fall Recall of Precautions/Restrictions: Impaired Precaution/Restrictions Comments: Large pannus, painful right foot with weightbearing; watch HR; continuous bladder irrigation Restrictions Weight Bearing  Restrictions Per Provider Order: No     Mobility  Bed Mobility Overal bed mobility: Needs Assistance Bed Mobility: Rolling, Supine to Sit, Sit to Supine Rolling: Used rails, +2 for physical assistance, Max assist   Supine to sit: Mod assist, +2 for physical assistance, HOB elevated, Used rails Sit to supine: +2 for safety/equipment, +2 for physical assistance, Max assist   General bed mobility comments: patient rolls to right with mod assist and max assist to roll to left. mod assist of 2 sup to sit and max to total assist for sit to supine due to assistance needed with trunk and BLEs    Transfers Overall transfer level: Needs assistance                 General transfer comment: not attempted    Ambulation/Gait                   Stairs             Wheelchair Mobility     Tilt Bed    Modified Rankin (Stroke Patients Only)       Balance Overall balance assessment: Needs assistance Sitting-balance support: Bilateral upper extremity supported, Single extremity supported Sitting balance-Leahy Scale: Poor Sitting balance - Comments: reliant on at least one extremity support when EOB, mostly with RUE. Sat a total of 10 min and performed some exercises.  Washed pts back and he combed his hair. Postural control: Posterior lean                                  Communication Communication Communication:  No apparent difficulties  Cognition Arousal: Alert Behavior During Therapy: Flat affect   PT - Cognitive impairments: No family/caregiver present to determine baseline                       PT - Cognition Comments: Pt perseverating on pain related to pannus and scrotum, difficult to redirect, pt able to do more than he thinks to assist with repositioning/self-supporting pannus while attempting to reposition him. Following commands: Intact      Cueing Cueing Techniques: Verbal cues, Gestural cues, Tactile cues  Exercises  General Exercises - Lower Extremity Long Arc Quad: AROM, Both, 10 reps, Seated    General Comments General comments (skin integrity, edema, etc.): VSS on RA,   pt reports discomfort throughout despite max efforts for readjusting pillows, rolling for pressure relief, trying alternate pillow or towels under his pannus and pillowcase sling under scrotum, pt reporting nothing is helping with his pain.      Pertinent Vitals/Pain Pain Assessment Pain Assessment: Faces Faces Pain Scale: Hurts even more Pain Location: right foot, pannus, scrotum Pain Descriptors / Indicators: Discomfort, Grimacing, Guarding, Stabbing Pain Intervention(s): Limited activity within patient's tolerance, Monitored during session, Premedicated before session, Repositioned    Home Living                          Prior Function            PT Goals (current goals can now be found in the care plan section) Acute Rehab PT Goals Patient Stated Goal: Less pain and to move better. Progress towards PT goals: Progressing toward goals    Frequency    Min 2X/week      PT Plan      Co-evaluation PT/OT/SLP Co-Evaluation/Treatment: Yes Reason for Co-Treatment: Complexity of the patient's impairments (multi-system involvement);For patient/therapist safety;To address functional/ADL transfers PT goals addressed during session: Mobility/safety with mobility;Proper use of DME OT goals addressed during session: ADL's and self-care      AM-PAC PT 6 Clicks Mobility   Outcome Measure  Help needed turning from your back to your side while in a flat bed without using bedrails?: Total (+2 and rail needed to roll fully) Help needed moving from lying on your back to sitting on the side of a flat bed without using bedrails?: Total Help needed moving to and from a bed to a chair (including a wheelchair)?: Total Help needed standing up from a chair using your arms (e.g., wheelchair or bedside chair)?: Total Help  needed to walk in hospital room?: Total Help needed climbing 3-5 steps with a railing? : Total 6 Click Score: 6    End of Session   Activity Tolerance: Patient limited by pain;Patient limited by fatigue Patient left: in bed;with call bell/phone within reach Nurse Communication: Mobility status;Need for lift equipment PT Visit Diagnosis: Other abnormalities of gait and mobility (R26.89);Pain Pain - Right/Left: Right Pain - part of body:  (pannus, scrotum, skin around these areas)     Time: 1202-1254 PT Time Calculation (min) (ACUTE ONLY): 52 min  Charges:    $Therapeutic Exercise: 8-22 mins $Therapeutic Activity: 8-22 mins PT General Charges $$ ACUTE PT VISIT: 1 Visit                     Toney Lizaola M,PT Acute Rehab Services [phone]    Stephane JULIANNA Bevel 12/03/2023, 3:15 PM

## 2023-12-04 DIAGNOSIS — A419 Sepsis, unspecified organism: Secondary | ICD-10-CM | POA: Diagnosis not present

## 2023-12-04 DIAGNOSIS — N179 Acute kidney failure, unspecified: Secondary | ICD-10-CM | POA: Diagnosis not present

## 2023-12-04 DIAGNOSIS — N3091 Cystitis, unspecified with hematuria: Secondary | ICD-10-CM | POA: Diagnosis not present

## 2023-12-04 DIAGNOSIS — E875 Hyperkalemia: Secondary | ICD-10-CM | POA: Diagnosis not present

## 2023-12-04 LAB — CULTURE, BLOOD (ROUTINE X 2)
Culture: NO GROWTH
Culture: NO GROWTH

## 2023-12-04 LAB — CBC
HCT: 25.6 % — ABNORMAL LOW (ref 39.0–52.0)
Hemoglobin: 7.7 g/dL — ABNORMAL LOW (ref 13.0–17.0)
MCH: 26.7 pg (ref 26.0–34.0)
MCHC: 30.1 g/dL (ref 30.0–36.0)
MCV: 88.9 fL (ref 80.0–100.0)
Platelets: 342 K/uL (ref 150–400)
RBC: 2.88 MIL/uL — ABNORMAL LOW (ref 4.22–5.81)
RDW: 16.8 % — ABNORMAL HIGH (ref 11.5–15.5)
WBC: 9.9 K/uL (ref 4.0–10.5)
nRBC: 0 % (ref 0.0–0.2)

## 2023-12-04 NOTE — Progress Notes (Signed)
 PROGRESS NOTE        PATIENT DETAILS Name: Curtis Hill Age: 78 y.o. Sex: male Date of Birth: 1945/10/16 Admit Date: 11/28/2023 Admitting Physician Harlene Na, DO ERE:Mndd, Carlin Redbird, MD  Brief Summary: Patient is a 78 y.o.  male with history of morbid obesity, prostate cancer-s/p TURBT 2012, OSA on CPAP, HTN, HLD-who presented from SNF for hematuria-upon further evaluation-patient was found to have septic shock secondary to hemorrhagic/emphysematous cystitis. Patient was admitted to the ICU-stabilized and subsequently transferred to [facility] on 8/20.    Significant events: 8/17>> to ED from SNF-hematuria-septic shock-hemorrhagic cystitis/emphysematous cystitis on CT imaging-unable to place Foley-urology consulted-underwent cystoscopy/urethral meatal dilatation and Foley catheter placement.  Admit to ICU for pressors. 8/20>> transferred to [facility]  Significant studies: 7/12>> echo: EF 60-65% 8/18>> CT abdomen/pelvis: Emphysematous/hemorrhagic cystitis.  Significant microbiology data: 7/11>> urine culture: Pseudomonas/Proteus. 8/18>> urine culture: No growth 8/18>> blood culture: No growth  Procedures: 8/18>> urethral dilatation-cystoscopy-Foley catheter placement by ED.  Consults: Urology PCCM Palliative care  Subjective: No major issues overnight-lying comfortably in bed.  Objective: Vitals: Blood pressure (!) 131/96, pulse (!) 104, temperature 98.2 F (36.8 C), temperature source Oral, resp. rate 20, height 5' 7 (1.702 m), weight (!) 178.5 kg, SpO2 94%.   Exam: Awake/alert Chronically frail appearing Not in any distress Chest: Clear to auscultation Abdomen: Soft nontender-large pannus in the lower abdomen that almost comes down to his knees Nonfocal exam but does have generalized weakness.  Pertinent Labs/Radiology:    Latest Ref Rng & Units 12/04/2023    5:21 AM 12/03/2023    5:49 AM 12/02/2023    5:08 AM  CBC  WBC 4.0 - 10.5 K/uL  9.9  10.7  10.6   Hemoglobin 13.0 - 17.0 g/dL 7.7  7.7  7.6   Hematocrit 39.0 - 52.0 % 25.6  25.2  24.4   Platelets 150 - 400 K/uL 342  358  345     Lab Results  Component Value Date   NA 139 12/03/2023   K 3.7 12/03/2023   CL 103 12/03/2023   CO2 27 12/03/2023     Assessment/Plan: Septic shock secondary to hemorrhagic and emphysematous cystitis Sepsis physiology has resolved All cultures negative so far Recent urine cultures positive for Pseudomonas/Proteus-currently on cefepime -7 days course planned-EOT 8/23.  Hemorrhagic/emphysematous cystitis See above regarding antibiotics Hematuria has resolved Urology recommending repeat CT on 8/25-and then decide whether to remove Foley catheter or not.  AKI Hemodynamically mediated-in the setting of septic shock AKI has resolved with treatment of underlying infectious issues.  Anemia Multifactorial-secondary to acute blood loss in the setting of hematuria-and acute/critical illness Hematuria has resolved-sepsis physiology improved-overall clinically improving Hb levels fluctuating-will continue to monitor Transfuse if Hb<7.  History of CAD Thoracic aortic aneurysm No anginal symptoms PCP to continue to follow/surveillance of aneurysm.  HTN All antihypertensives remain on hold-resume when BP is more elevated.  HLD Statin  History of pulmonary hypertension Supportive care As needed diuretics if develops volume overload  Bronchial asthma Not in exacerbation As needed bronchodilators.  OSA on CPAP Chronic hypoxic respiratory failure on 3 L of oxygen New CPAP/O2 Supportive care  GERD PPI  Neuropathy Neurontin   Gout No flare Allopurinol   Debility/deconditioning Mostly bedbound status for almost a month-minimally mobile before that-with the help of a walker PT/OT eval SNF planned on discharge.  Nutrition Status: Nutrition Problem:  Increased nutrient needs Etiology: wound healing Signs/Symptoms: estimated  needs Interventions: Juven, Prostat, Refer to RD note for recommendations  Class 3 Obesity Apparently on Ozempic as an outpatient Has a large pannus in his lower abdomen down to his lower thighs  Estimated body mass index is 61.63 kg/m as calculated from the following:   Height as of this encounter: 5' 7 (1.702 m).   Weight as of this encounter: 178.5 kg.   Code status:   Code Status: Full Code   DVT Prophylaxis: SCDs Start: 11/29/23 0405   Family Communication: Spouse-[phone] left VM 8/21,8/22   Disposition Plan: Status is: Inpatient Remains inpatient appropriate because: Severity of illness   Planned Discharge Destination:Skilled nursing facility   Diet: Diet Order             Diet Heart Room service appropriate? Yes with Assist; Fluid consistency: Thin  Diet effective now                     Antimicrobial agents: Anti-infectives (From admission, onward)    Start     Dose/Rate Route Frequency Ordered Stop   12/01/23 1000  ceFEPIme  (MAXIPIME ) 2 g in sodium chloride  0.9 % 100 mL IVPB        2 g 200 mL/hr over 30 Minutes Intravenous Every 12 hours 12/01/23 0750 12/08/23 0959   11/30/23 0200  cefTRIAXone  (ROCEPHIN ) 2 g in sodium chloride  0.9 % 100 mL IVPB  Status:  Discontinued        2 g 200 mL/hr over 30 Minutes Intravenous Every 24 hours 11/29/23 0413 11/29/23 0459   11/29/23 2300  ceFEPIme  (MAXIPIME ) 2 g in sodium chloride  0.9 % 100 mL IVPB  Status:  Discontinued        2 g 200 mL/hr over 30 Minutes Intravenous Every 24 hours 11/29/23 0459 12/01/23 0750   11/29/23 0000  ceFEPIme  (MAXIPIME ) 2 g in sodium chloride  0.9 % 100 mL IVPB        2 g 200 mL/hr over 30 Minutes Intravenous  Once 11/28/23 2349 11/29/23 0122        MEDICATIONS: Scheduled Meds:  (feeding supplement) PROSource Plus  30 mL Oral BID BM   allopurinol   300 mg Oral Daily   atorvastatin   20 mg Oral Daily   Chlorhexidine  Gluconate Cloth  6 each Topical Daily   gabapentin    100 mg Oral TID   Gerhardt's butt cream   Topical BID   leptospermum manuka honey  1 Application Topical Daily   nutrition supplement (JUVEN)  1 packet Oral BID BM   Ensure Max Protein  11 oz Oral Daily   senna-docusate  1 tablet Oral BID   Continuous Infusions:  ceFEPime  (MAXIPIME ) IV 2 g (12/04/23 0936)   PRN Meds:.docusate sodium , HYDROcodone -acetaminophen , metoprolol  tartrate, mouth rinse, polyethylene glycol, sodium chloride , traMADol    I have personally reviewed following labs and imaging studies  LABORATORY DATA: CBC: Recent Labs  Lab 11/28/23 2137 11/29/23 0448 11/29/23 0926 11/30/23 0339 11/30/23 0920 11/30/23 1913 12/01/23 0801 12/02/23 0508 12/03/23 0549 12/04/23 0521  WBC 26.0* 25.7*  --  14.1*  --   --   --  10.6* 10.7* 9.9  NEUTROABS 23.6*  --   --   --   --   --   --   --   --   --   HGB 8.6* 7.6*   < > 7.9*   < > 7.9* 8.9* 7.6* 7.7* 7.7*  HCT 29.5* 25.9*   < >  25.5*   < > 25.9* 29.7* 24.4* 25.2* 25.6*  MCV 89.9 89.6  --  87.9  --   --   --  88.7 90.0 88.9  PLT 524* 466*  --  365  --   --   --  345 358 342   < > = values in this interval not displayed.    Basic Metabolic Panel: Recent Labs  Lab 11/29/23 0448 11/29/23 0926 11/29/23 1035 11/29/23 2039 11/30/23 0339 12/01/23 0317 12/02/23 0508 12/03/23 0549  NA 131* 131*   < > 137 139 138 141 139  K 6.1* 5.6*   < > 5.1 4.1 3.7 3.5 3.7  CL 94* 95*   < > 99 102 99 100 103  CO2 26 25   < > 28 27 28 29 27   GLUCOSE 129* 127*   < > 102* 96 115* 106* 98  BUN 108* 103*   < > 92* 80* 70* 54* 46*  CREATININE 4.54* 3.90*   < > 3.14* 2.60* 1.82* 1.21 1.07  CALCIUM  8.8* 8.7*   < > 8.9 8.8* 8.7* 8.5* 8.6*  MG 2.0  --   --   --  1.8 1.6* 1.7  --   PHOS 5.2* 5.0*  --   --  4.7* 3.4  --   --    < > = values in this interval not displayed.    GFR: Estimated Creatinine Clearance: 90.9 mL/min (by C-G formula based on SCr of 1.07 mg/dL).  Liver Function Tests: Recent Labs  Lab 11/28/23 2137 11/29/23 0448   AST 42* 35  ALT 25 24  ALKPHOS 125 122  BILITOT 0.5 0.5  PROT 8.9* 7.8  ALBUMIN  2.2* 2.0*   No results for input(s): LIPASE, AMYLASE in the last 168 hours. No results for input(s): AMMONIA in the last 168 hours.  Coagulation Profile: Recent Labs  Lab 11/29/23 0448  INR 1.1    Cardiac Enzymes: No results for input(s): CKTOTAL, CKMB, CKMBINDEX, TROPONINI in the last 168 hours.  BNP (last 3 results) No results for input(s): PROBNP in the last 8760 hours.  Lipid Profile: No results for input(s): CHOL, HDL, LDLCALC, TRIG, CHOLHDL, LDLDIRECT in the last 72 hours.  Thyroid  Function Tests: No results for input(s): TSH, T4TOTAL, FREET4, T3FREE, THYROIDAB in the last 72 hours.  Anemia Panel: No results for input(s): VITAMINB12, FOLATE, FERRITIN, TIBC, IRON, RETICCTPCT in the last 72 hours.  Urine analysis:    Component Value Date/Time   COLORURINE RED (A) 11/29/2023 0538   APPEARANCEUR TURBID (A) 11/29/2023 0538   LABSPEC  11/29/2023 0538    TEST NOT REPORTED DUE TO COLOR INTERFERENCE OF URINE PIGMENT   PHURINE  11/29/2023 0538    TEST NOT REPORTED DUE TO COLOR INTERFERENCE OF URINE PIGMENT   GLUCOSEU (A) 11/29/2023 0538    TEST NOT REPORTED DUE TO COLOR INTERFERENCE OF URINE PIGMENT   HGBUR (A) 11/29/2023 0538    TEST NOT REPORTED DUE TO COLOR INTERFERENCE OF URINE PIGMENT   BILIRUBINUR (A) 11/29/2023 0538    TEST NOT REPORTED DUE TO COLOR INTERFERENCE OF URINE PIGMENT   KETONESUR (A) 11/29/2023 0538    TEST NOT REPORTED DUE TO COLOR INTERFERENCE OF URINE PIGMENT   PROTEINUR (A) 11/29/2023 0538    TEST NOT REPORTED DUE TO COLOR INTERFERENCE OF URINE PIGMENT   UROBILINOGEN 1.0 02/18/2007 1617   NITRITE (A) 11/29/2023 0538    TEST NOT REPORTED DUE TO COLOR INTERFERENCE OF URINE PIGMENT   LEUKOCYTESUR (A)  11/29/2023 0538    TEST NOT REPORTED DUE TO COLOR INTERFERENCE OF URINE PIGMENT    Sepsis Labs: Lactic Acid,  Venous    Component Value Date/Time   LATICACIDVEN 1.0 11/29/2023 0926    MICROBIOLOGY: Recent Results (from the past 240 hours)  Blood culture (routine x 2)     Status: None   Collection Time: 11/29/23 12:30 AM   Specimen: BLOOD  Result Value Ref Range Status   Specimen Description BLOOD LEFT ANTECUBITAL  Final   Special Requests   Final    BOTTLES DRAWN AEROBIC AND ANAEROBIC Blood Culture results may not be optimal due to an inadequate volume of blood received in culture bottles   Culture   Final    NO GROWTH 5 DAYS Performed at [facility], [address], [place], [place] [address]    Report Status 12/04/2023 FINAL  Final  Blood culture (routine x 2)     Status: None   Collection Time: 11/29/23 12:35 AM   Specimen: BLOOD  Result Value Ref Range Status   Specimen Description BLOOD LEFT UPPER ARM  Final   Special Requests   Final    BOTTLES DRAWN AEROBIC AND ANAEROBIC Blood Culture results may not be optimal due to an inadequate volume of blood received in culture bottles   Culture   Final    NO GROWTH 5 DAYS Performed at [facility], [address], [place], [place] [address]    Report Status 12/04/2023 FINAL  Final  MRSA Next Gen by PCR, Nasal     Status: None   Collection Time: 11/29/23  5:30 AM   Specimen: Urine, Catheterized; Nasal Swab  Result Value Ref Range Status   MRSA by PCR Next Gen NOT DETECTED NOT DETECTED Final    Comment: (NOTE) The GeneXpert MRSA Assay (FDA approved for NASAL specimens only), is one component of a comprehensive MRSA colonization surveillance program. It is not intended to diagnose MRSA infection nor to guide or monitor treatment for MRSA infections. Test performance is not FDA approved in patients less than 19 years old. Performed at [facility], [address], [place], [place] [address]   Urine Culture     Status: None   Collection Time: 11/29/23  5:38 AM   Specimen: Urine, Random  Result Value Ref Range  Status   Specimen Description URINE, RANDOM  Final   Special Requests NONE Reflexed from [id number]  Final   Culture   Final    NO GROWTH Performed at [facility], [address], [place], [place] [address]    Report Status 11/30/2023 FINAL  Final    RADIOLOGY STUDIES/RESULTS: No results found.   LOS: 5 days   Donalda Applebaum, MD  [facility]    To contact the attending provider between 7A-7P or the covering provider during after hours 7P-7A, please log into the web site [email or web address] and access using universal [place] password for that web site. If you do not have the password, please call the hospital operator.  12/04/2023, 11:11 AM

## 2023-12-05 DIAGNOSIS — R310 Gross hematuria: Secondary | ICD-10-CM | POA: Diagnosis not present

## 2023-12-05 LAB — TYPE AND SCREEN
ABO/RH(D): O POS
Antibody Screen: NEGATIVE

## 2023-12-05 LAB — CBC
HCT: 27.4 % — ABNORMAL LOW (ref 39.0–52.0)
Hemoglobin: 7.9 g/dL — ABNORMAL LOW (ref 13.0–17.0)
MCH: 27.1 pg (ref 26.0–34.0)
MCHC: 28.8 g/dL — ABNORMAL LOW (ref 30.0–36.0)
MCV: 93.8 fL (ref 80.0–100.0)
Platelets: 328 K/uL (ref 150–400)
RBC: 2.92 MIL/uL — ABNORMAL LOW (ref 4.22–5.81)
RDW: 17.2 % — ABNORMAL HIGH (ref 11.5–15.5)
WBC: 9.8 K/uL (ref 4.0–10.5)
nRBC: 0 % (ref 0.0–0.2)

## 2023-12-05 LAB — GLUCOSE, CAPILLARY: Glucose-Capillary: 164 mg/dL — ABNORMAL HIGH (ref 70–99)

## 2023-12-05 LAB — PROTIME-INR
INR: 1.2 (ref 0.8–1.2)
Prothrombin Time: 15.4 s — ABNORMAL HIGH (ref 11.4–15.2)

## 2023-12-05 NOTE — Progress Notes (Signed)
 PROGRESS NOTE        PATIENT DETAILS Name: Curtis Hill Age: 78 y.o. Sex: male Date of Birth: [birth year] Admit Date: 11/28/2023 Admitting Physician Harlene Na, DO ERE:Mndd, Carlin Redbird, MD  Brief Summary: Patient is a 78 y.o.  male with history of morbid obesity, prostate cancer-s/p TURBT 2012, OSA on CPAP, HTN, HLD-who presented from SNF for hematuria-upon further evaluation-patient was found to have septic shock secondary to hemorrhagic/emphysematous cystitis. Patient was admitted to the ICU-stabilized and subsequently transferred to [facility] on 8/20.    Significant events: 8/17>> to ED from SNF-hematuria-septic shock-hemorrhagic cystitis/emphysematous cystitis on CT imaging-unable to place Foley-urology consulted-underwent cystoscopy/urethral meatal dilatation and Foley catheter placement.  Admit to ICU for pressors. 8/20>> transferred to [facility]  Significant studies: 7/12>> echo: EF 60-65% 8/18>> CT abdomen/pelvis: Emphysematous/hemorrhagic cystitis.  Significant microbiology data: 7/11>> urine culture: Pseudomonas/Proteus. 8/18>> urine culture: No growth 8/18>> blood culture: No growth  Procedures: 8/18>> urethral dilatation-cystoscopy-Foley catheter placement by ED.  Consults: Urology PCCM Palliative care  Subjective:  Patient in bed, appears comfortable, denies any headache, no fever, no chest pain or pressure, no shortness of breath , no abdominal pain. No new focal weakness.   Objective: Vitals: Blood pressure 134/79, pulse 93, temperature 98.1 F (36.7 C), temperature source Oral, resp. rate 20, height 5' 7 (1.702 m), weight (!) 174.9 kg, SpO2 96%.   Exam:  Awake Alert, No new F.N deficits, Normal affect [place].AT,PERRAL Supple Neck, No JVD,   Symmetrical Chest wall movement, Good air movement bilaterally, CTAB RRR,No Gallops, Rubs or new Murmurs,  +ve B.Sounds, Abd Soft, No tenderness, large pannus, Foley in place No Cyanosis,  Clubbing or edema    Assessment/Plan: Septic shock secondary to hemorrhagic and emphysematous cystitis Sepsis physiology has resolved All cultures negative so far Recent urine cultures positive for Pseudomonas/Proteus-currently on cefepime -7 days course planned-EOT 8/23.  Hemorrhagic/emphysematous cystitis See above regarding antibiotics Hematuria has resolved Urology recommending repeat CT on 8/25-and then decide whether to remove Foley catheter or not.  AKI Hemodynamically mediated-in the setting of septic shock AKI has resolved with treatment of underlying infectious issues.  Anemia Multifactorial-secondary to acute blood loss in the setting of hematuria-and acute/critical illness Hematuria has resolved-sepsis physiology improved-overall clinically improving Hb levels fluctuating-will continue to monitor Transfuse if Hb<7.  History of CAD Thoracic aortic aneurysm No anginal symptoms PCP to continue to follow/surveillance of aneurysm.  HTN All antihypertensives remain on hold-resume when BP is more elevated.  HLD Statin  History of pulmonary hypertension Supportive care As needed diuretics if develops volume overload  Bronchial asthma Not in exacerbation As needed bronchodilators.  OSA on CPAP Chronic hypoxic respiratory failure on 3 L of oxygen New CPAP/O2 Supportive care  GERD PPI  Neuropathy Neurontin   Gout No flare Allopurinol   Debility/deconditioning Mostly bedbound status for almost a month-minimally mobile before that-with the help of a walker PT/OT eval SNF planned on discharge.  Nutrition Status: Nutrition Problem: Increased nutrient needs Etiology: wound healing Signs/Symptoms: estimated needs Interventions: Juven, Prostat, Refer to RD note for recommendations  Class 3 Obesity Apparently on Ozempic as an outpatient Has a large pannus in his lower abdomen down to his lower thighs  Estimated body mass index is 60.39 kg/m as  calculated from the following:   Height as of this encounter: 5' 7 (1.702 m).   Weight as of this encounter: 174.9 kg.  Code status:   Code Status: Full Code   DVT Prophylaxis: SCDs Start: 11/29/23 0405   Family Communication: Spouse-Shirley-[phone] left VM 8/21,8/22   Disposition Plan: Status is: Inpatient Remains inpatient appropriate because: Severity of illness   Planned Discharge Destination:Skilled nursing facility   Diet: Diet Order             Diet Heart Room service appropriate? Yes with Assist; Fluid consistency: Thin  Diet effective now                     Data Review:   Inpatient Medications  Scheduled Meds:  (feeding supplement) PROSource Plus  30 mL Oral BID BM   allopurinol   300 mg Oral Daily   atorvastatin   20 mg Oral Daily   Chlorhexidine  Gluconate Cloth  6 each Topical Daily   gabapentin   100 mg Oral TID   Gerhardt's butt cream   Topical BID   leptospermum manuka honey  1 Application Topical Daily   nutrition supplement (JUVEN)  1 packet Oral BID BM   Ensure Max Protein  11 oz Oral Daily   senna-docusate  1 tablet Oral BID   Continuous Infusions:  ceFEPime  (MAXIPIME ) IV 2 g (12/05/23 0830)   PRN Meds:.docusate sodium , HYDROcodone -acetaminophen , metoprolol  tartrate, mouth rinse, polyethylene glycol, sodium chloride , traMADol   DVT Prophylaxis  SCDs Start: 11/29/23 0405   Recent Labs  Lab 11/28/23 2137 11/29/23 0448 11/30/23 0339 11/30/23 0920 12/01/23 0801 12/02/23 0508 12/03/23 0549 12/04/23 0521 12/05/23 0529  WBC 26.0*   < > 14.1*  --   --  10.6* 10.7* 9.9 9.8  HGB 8.6*   < > 7.9*   < > 8.9* 7.6* 7.7* 7.7* 7.9*  HCT 29.5*   < > 25.5*   < > 29.7* 24.4* 25.2* 25.6* 27.4*  PLT 524*   < > 365  --   --  345 358 342 328  MCV 89.9   < > 87.9  --   --  88.7 90.0 88.9 93.8  MCH 26.2   < > 27.2  --   --  27.6 27.5 26.7 27.1  MCHC 29.2*   < > 31.0  --   --  31.1 30.6 30.1 28.8*  RDW 16.8*   < > 16.6*  --   --  16.8* 16.7*  16.8* 17.2*  LYMPHSABS 0.8  --   --   --   --   --   --   --   --   MONOABS 1.3*  --   --   --   --   --   --   --   --   EOSABS 0.0  --   --   --   --   --   --   --   --   BASOSABS 0.1  --   --   --   --   --   --   --   --    < > = values in this interval not displayed.    Recent Labs  Lab 11/28/23 2137 11/29/23 0036 11/29/23 0407 11/29/23 0448 11/29/23 9073 11/29/23 1035 11/29/23 2039 11/30/23 0339 12/01/23 0317 12/02/23 0508 12/03/23 0549 12/05/23 0625  NA 130*  --   --  131* 131*   < > 137 139 138 141 139  --   K 6.8*  --   --  6.1* 5.6*   < > 5.1 4.1 3.7 3.5 3.7  --   CL 90*  --   --  94* 95*   < > 99 102 99 100 103  --   CO2 25  --   --  26 25   < > 28 27 28 29 27   --   ANIONGAP 15  --   --  11 11   < > 10 10 11 12 9   --   GLUCOSE 135*  --   --  129* 127*   < > 102* 96 115* 106* 98  --   BUN 117*  --   --  108* 103*   < > 92* 80* 70* 54* 46*  --   CREATININE 5.14*  --   --  4.54* 3.90*   < > 3.14* 2.60* 1.82* 1.21 1.07  --   AST 42*  --   --  35  --   --   --   --   --   --   --   --   ALT 25  --   --  24  --   --   --   --   --   --   --   --   ALKPHOS 125  --   --  122  --   --   --   --   --   --   --   --   BILITOT 0.5  --   --  0.5  --   --   --   --   --   --   --   --   ALBUMIN  2.2*  --   --  2.0*  --   --   --   --   --   --   --   --   LATICACIDVEN  --  1.7  --  2.1* 1.0  --   --   --   --   --   --   --   INR  --   --   --  1.1  --   --   --   --   --   --   --  1.2  BNP  --   --  57.2  --   --   --   --   --   --   --   --   --   MG  --   --   --  2.0  --   --   --  1.8 1.6* 1.7  --   --   PHOS  --   --   --  5.2* 5.0*  --   --  4.7* 3.4  --   --   --   CALCIUM  9.3  --   --  8.8* 8.7*   < > 8.9 8.8* 8.7* 8.5* 8.6*  --    < > = values in this interval not displayed.      Recent Labs  Lab 11/29/23 0036 11/29/23 0407 11/29/23 0448 11/29/23 9073 11/29/23 1828 11/29/23 2039 11/30/23 0339 12/01/23 0317 12/02/23 0508 12/03/23 0549 12/05/23 0625   LATICACIDVEN 1.7  --  2.1* 1.0  --   --   --   --   --   --   --   INR  --   --  1.1  --   --   --   --   --   --   --  1.2  BNP  --  57.2  --   --   --   --   --   --   --   --   --  MG  --   --  2.0  --   --   --  1.8 1.6* 1.7  --   --   CALCIUM   --   --  8.8* 8.7*   < > 8.9 8.8* 8.7* 8.5* 8.6*  --    < > = values in this interval not displayed.    --------------------------------------------------------------------------------------------------------------- Lab Results  Component Value Date   CHOL 122 01/13/2021   HDL 66 01/13/2021   LDLCALC 42 01/13/2021   TRIG 63 01/13/2021   CHOLHDL 1.8 11/28/2019    Lab Results  Component Value Date   HGBA1C 5.6 12/04/2020   No results for input(s): TSH, T4TOTAL, FREET4, T3FREE, THYROIDAB in the last 72 hours. No results for input(s): VITAMINB12, FOLATE, FERRITIN, TIBC, IRON, RETICCTPCT in the last 72 hours. ------------------------------------------------------------------------------------------------------------------ Cardiac Enzymes No results for input(s): CKMB, TROPONINI, MYOGLOBIN in the last 168 hours.  Invalid input(s): CK  Micro Results Recent Results (from the past 240 hours)  Blood culture (routine x 2)     Status: None   Collection Time: 11/29/23 12:30 AM   Specimen: BLOOD  Result Value Ref Range Status   Specimen Description BLOOD LEFT ANTECUBITAL  Final   Special Requests   Final    BOTTLES DRAWN AEROBIC AND ANAEROBIC Blood Culture results may not be optimal due to an inadequate volume of blood received in culture bottles   Culture   Final    NO GROWTH 5 DAYS Performed at [facility], [address] [place], [place] [address]    Report Status 12/04/2023 FINAL  Final  Blood culture (routine x 2)     Status: None   Collection Time: 11/29/23 12:35 AM   Specimen: BLOOD  Result Value Ref Range Status   Specimen Description BLOOD LEFT UPPER ARM  Final   Special Requests   Final     BOTTLES DRAWN AEROBIC AND ANAEROBIC Blood Culture results may not be optimal due to an inadequate volume of blood received in culture bottles   Culture   Final    NO GROWTH 5 DAYS Performed at [facility], [address], [place], [place] [address]    Report Status 12/04/2023 FINAL  Final  MRSA Next Gen by PCR, Nasal     Status: None   Collection Time: 11/29/23  5:30 AM   Specimen: Urine, Catheterized; Nasal Swab  Result Value Ref Range Status   MRSA by PCR Next Gen NOT DETECTED NOT DETECTED Final    Comment: (NOTE) The GeneXpert MRSA Assay (FDA approved for NASAL specimens only), is one component of a comprehensive MRSA colonization surveillance program. It is not intended to diagnose MRSA infection nor to guide or monitor treatment for MRSA infections. Test performance is not FDA approved in patients less than 33 years old. Performed at [facility], [address] [place], [place] [address]   Urine Culture     Status: None   Collection Time: 11/29/23  5:38 AM   Specimen: Urine, Random  Result Value Ref Range Status   Specimen Description URINE, RANDOM  Final   Special Requests NONE Reflexed from [id number]  Final   Culture   Final    NO GROWTH Performed at [facility], [address], [place], [place] [address]    Report Status 11/30/2023 FINAL  Final    Radiology Reports  No results found.    Signature  -   Lavada Stank M.D on 12/05/2023 at 8:51  AM   -  To page go to [email or web address]

## 2023-12-05 NOTE — Plan of Care (Signed)

## 2023-12-06 ENCOUNTER — Inpatient Hospital Stay (HOSPITAL_COMMUNITY)

## 2023-12-06 DIAGNOSIS — R310 Gross hematuria: Secondary | ICD-10-CM | POA: Diagnosis not present

## 2023-12-06 LAB — CBC WITH DIFFERENTIAL/PLATELET
Abs Immature Granulocytes: 0.07 K/uL (ref 0.00–0.07)
Basophils Absolute: 0.1 K/uL (ref 0.0–0.1)
Basophils Relative: 1 %
Eosinophils Absolute: 0.6 K/uL — ABNORMAL HIGH (ref 0.0–0.5)
Eosinophils Relative: 6 %
HCT: 26.6 % — ABNORMAL LOW (ref 39.0–52.0)
Hemoglobin: 7.9 g/dL — ABNORMAL LOW (ref 13.0–17.0)
Immature Granulocytes: 1 %
Lymphocytes Relative: 17 %
Lymphs Abs: 1.7 K/uL (ref 0.7–4.0)
MCH: 26.8 pg (ref 26.0–34.0)
MCHC: 29.7 g/dL — ABNORMAL LOW (ref 30.0–36.0)
MCV: 90.2 fL (ref 80.0–100.0)
Monocytes Absolute: 0.7 K/uL (ref 0.1–1.0)
Monocytes Relative: 7 %
Neutro Abs: 7 K/uL (ref 1.7–7.7)
Neutrophils Relative %: 68 %
Platelets: 334 K/uL (ref 150–400)
RBC: 2.95 MIL/uL — ABNORMAL LOW (ref 4.22–5.81)
RDW: 17.2 % — ABNORMAL HIGH (ref 11.5–15.5)
WBC: 10.1 K/uL (ref 4.0–10.5)
nRBC: 0 % (ref 0.0–0.2)

## 2023-12-06 LAB — BASIC METABOLIC PANEL WITH GFR
Anion gap: 7 (ref 5–15)
BUN: 41 mg/dL — ABNORMAL HIGH (ref 8–23)
CO2: 28 mmol/L (ref 22–32)
Calcium: 8.8 mg/dL — ABNORMAL LOW (ref 8.9–10.3)
Chloride: 103 mmol/L (ref 98–111)
Creatinine, Ser: 0.9 mg/dL (ref 0.61–1.24)
GFR, Estimated: >60 mL/min (ref >60)
Glucose, Bld: 91 mg/dL (ref 70–99)
Potassium: 3.8 mmol/L (ref 3.5–5.1)
Sodium: 138 mmol/L (ref 135–145)

## 2023-12-06 LAB — MAGNESIUM: Magnesium: 1.2 mg/dL — ABNORMAL LOW (ref 1.7–2.4)

## 2023-12-06 MED ORDER — POLYETHYLENE GLYCOL 3350 17 G PO PACK
17.0000 g | PACK | Freq: Every day | ORAL | Status: DC
  Administered 2023-12-06 – 2023-12-10 (×5): 17 g via ORAL
  Filled 2023-12-06 (×5): qty 1

## 2023-12-06 MED ORDER — MAGNESIUM SULFATE IN D5W 1-5 GM/100ML-% IV SOLN
1.0000 g | Freq: Once | INTRAVENOUS | Status: AC
  Administered 2023-12-06: 1 g via INTRAVENOUS
  Filled 2023-12-06: qty 100

## 2023-12-06 MED ORDER — MAGNESIUM SULFATE 4 GM/100ML IV SOLN
4.0000 g | Freq: Once | INTRAVENOUS | Status: AC
  Administered 2023-12-06: 4 g via INTRAVENOUS
  Filled 2023-12-06: qty 100

## 2023-12-06 NOTE — Progress Notes (Signed)
 Physical Therapy Treatment Patient Details Name: Curtis Hill MRN: [id number] DOB: 11-Oct-1945 Today's Date: 12/06/2023   History of Present Illness 78 year old man male with admittied from SNF with gross hematuria, urinary retention and AKI.  He has had 3 admissions in the past 6 months, most recently from 7/11 to 10/25/2023 for altered mental status secondary to sepsis due to pseudomonal UTI.  He was also hospitalized with sepsis due to panniculitis from 5/24-09/13/23. Pt with history of prostate cancer, thoracic aortic ascending aneurysm, CAD, pulmonary hypertension, hyperlipidemia, hypertension, asthma, iron deficiency, and morbid obesity, right foot pain.    PT Comments  Pt making progress towards his physical therapy goals, requiring less assist overall for bed mobility this session. Pt able to roll to R without physical assist, moderate assist (+2 safety) to transition to sitting position. Attempted to utilize locked Eva walker to stand from edge of bed, but pt unable to significantly clear bottom. Minimally weightbearing through legs and continues to report R foot pain. Patient will benefit from continued inpatient follow up therapy, <3 hours/day to address deficits and maximize functional mobility.    If plan is discharge home, recommend the following: Two people to help with bathing/dressing/bathroom;Two people to help with walking and/or transfers   Can travel by private vehicle     No  Equipment Recommendations  [place]ley lift;Hospital bed;Other (comment) (if home, he would need bariatric hospital air bed, bariatric hoyer, ramp, custom electric wheelchair (pt reports wc is in process of ordering?))    Recommendations for Other Services       Precautions / Restrictions Precautions Precautions: Fall Recall of Precautions/Restrictions: Impaired Precaution/Restrictions Comments: Large pannus, painful right foot with weightbearing; watch HR Restrictions Weight Bearing Restrictions Per  Provider Order: No     Mobility  Bed Mobility Overal bed mobility: Needs Assistance Bed Mobility: Rolling, Supine to Sit, Sit to Supine Rolling: Used rails, +2 for safety/equipment, Mod assist, Contact guard assist   Supine to sit: Mod assist, +2 for physical assistance, HOB elevated, Used rails Sit to supine: +2 for safety/equipment, +2 for physical assistance, Max assist   General bed mobility comments: Pt rolls to L with modA, to R with CGA. Transitioning self to edge of bed towards right, with assist of bed pad at hips to scoot forward. To return to supine, requires guarding at trunk and help with BLE's.    Transfers Overall transfer level: Needs assistance                 General transfer comment: Unable with lowered, locked Elyn    Ambulation/Gait                   Stairs             Wheelchair Mobility     Tilt Bed    Modified Rankin (Stroke Patients Only)       Balance Overall balance assessment: Needs assistance Sitting-balance support: Bilateral upper extremity supported, Single extremity supported Sitting balance-Leahy Scale: Fair                                      Hotel manager: No apparent difficulties  Cognition Arousal: Alert Behavior During Therapy: Flat affect   PT - Cognitive impairments: No family/caregiver present to determine baseline  Following commands: Intact      Cueing Cueing Techniques: Verbal cues, Gestural cues, Tactile cues  Exercises      General Comments        Pertinent Vitals/Pain Pain Assessment Pain Assessment: Faces Faces Pain Scale: Hurts even more Pain Location: right foot, RLE, pannus, Pain Descriptors / Indicators: Discomfort, Grimacing, Guarding Pain Intervention(s): Limited activity within patient's tolerance, Monitored during session    Home Living                          Prior Function             PT Goals (current goals can now be found in the care plan section) Acute Rehab PT Goals Patient Stated Goal: Less pain and to move better. Potential to Achieve Goals: Fair Progress towards PT goals: Progressing toward goals    Frequency    Min 2X/week      PT Plan      Co-evaluation              AM-PAC PT 6 Clicks Mobility   Outcome Measure  Help needed turning from your back to your side while in a flat bed without using bedrails?: Total (+2 and rail needed to roll fully) Help needed moving from lying on your back to sitting on the side of a flat bed without using bedrails?: Total Help needed moving to and from a bed to a chair (including a wheelchair)?: Total Help needed standing up from a chair using your arms (e.g., wheelchair or bedside chair)?: Total Help needed to walk in hospital room?: Total Help needed climbing 3-5 steps with a railing? : Total 6 Click Score: 6    End of Session   Activity Tolerance: Patient limited by fatigue Patient left: in bed;with call bell/phone within reach Nurse Communication: Mobility status PT Visit Diagnosis: Other abnormalities of gait and mobility (R26.89);Pain Pain - Right/Left: Right Pain - part of body:  (pannus, scrotum, skin around these areas)     Time: 8494-8464 PT Time Calculation (min) (ACUTE ONLY): 30 min  Charges:    $Therapeutic Activity: 23-37 mins PT General Charges $$ ACUTE PT VISIT: 1 Visit                     Aleck Daring, PT, DPT Acute Rehabilitation Services Office [phone]    Alayne ONEIDA Daring 12/06/2023, 4:09 PM

## 2023-12-06 NOTE — Progress Notes (Signed)
 PROGRESS NOTE        PATIENT DETAILS Name: Curtis Hill Age: 78 y.o. Sex: male Date of Birth: 19-Feb-1946 Admit Date: 11/28/2023 Admitting Physician Harlene Na, DO ERE:Mndd, Carlin Redbird, MD  Brief Summary: Patient is a 78 y.o.  male with history of morbid obesity, prostate cancer-s/p TURBT 2012, OSA on CPAP, HTN, HLD-who presented from SNF for hematuria-upon further evaluation-patient was found to have septic shock secondary to hemorrhagic/emphysematous cystitis. Patient was admitted to the ICU-stabilized and subsequently transferred to [facility] on 8/20.    Significant events: 8/17>> to ED from SNF-hematuria-septic shock-hemorrhagic cystitis/emphysematous cystitis on CT imaging-unable to place Foley-urology consulted-underwent cystoscopy/urethral meatal dilatation and Foley catheter placement.  Admit to ICU for pressors. 8/20>> transferred to [facility]  Significant studies: 7/12>> echo: EF 60-65% 8/18>> CT abdomen/pelvis: Emphysematous/hemorrhagic cystitis.  Significant microbiology data: 7/11>> urine culture: Pseudomonas/Proteus. 8/18>> urine culture: No growth 8/18>> blood culture: No growth  Procedures: 8/18>> urethral dilatation-cystoscopy-Foley catheter placement by ED.  Consults: Urology PCCM Palliative care  Subjective:  Patient in bed, appears comfortable, denies any headache, no fever, no chest pain or pressure, no shortness of breath , no abdominal pain. No new focal weakness.   Objective: Vitals: Blood pressure 138/89, pulse 92, temperature 98.1 F (36.7 C), temperature source Oral, resp. rate 11, height 5' 2 (1.575 m), weight (!) 174.9 kg, SpO2 96%.   Exam:  Awake Alert, No new F.N deficits, Normal affect [place].AT,PERRAL Supple Neck, No JVD,   Symmetrical Chest wall movement, Good air movement bilaterally, CTAB RRR,No Gallops, Rubs or new Murmurs,  +ve B.Sounds, Abd Soft, No tenderness, large pannus, Foley in place No Cyanosis,  Clubbing or edema    Assessment/Plan:  Septic shock secondary to hemorrhagic and emphysematous cystitis Sepsis physiology has resolved All cultures negative so far Recent urine cultures positive for Pseudomonas/Proteus-currently on IV cephalosporin.  Hemorrhagic/emphysematous cystitis See above regarding antibiotics Hematuria has resolved Urology recommending repeat CT on 8/25-and then decide whether to remove Foley catheter or not.  AKI Hemodynamically mediated-in the setting of septic shock AKI has resolved with treatment of underlying infectious issues.  Anemia Multifactorial-secondary to acute blood loss in the setting of hematuria-and acute/critical illness Hematuria has resolved-sepsis physiology improved-overall clinically improving Hb levels fluctuating-will continue to monitor Transfuse if Hb<7.  History of CAD Thoracic aortic aneurysm No anginal symptoms PCP to continue to follow/surveillance of aneurysm.  HTN All antihypertensives remain on hold-resume when BP is more elevated.  HLD Statin  History of pulmonary hypertension Supportive care As needed diuretics if develops volume overload  Bronchial asthma Not in exacerbation As needed bronchodilators.  OSA on CPAP Chronic hypoxic respiratory failure on 3 L of oxygen New CPAP/O2 Supportive care  GERD PPI  Neuropathy Neurontin   Gout No flare Allopurinol   Debility/deconditioning Mostly bedbound status for almost a month-minimally mobile before that-with the help of a walker PT/OT eval SNF planned on discharge.  Nutrition Status: Nutrition Problem: Increased nutrient needs Etiology: wound healing Signs/Symptoms: estimated needs Interventions: Juven, Prostat, Refer to RD note for recommendations  Class 3 Obesity Apparently on Ozempic as an outpatient Has a large pannus in his lower abdomen down to his lower thighs  Estimated body mass index is 70.52 kg/m as calculated from the  following:   Height as of this encounter: 5' 2 (1.575 m).   Weight as of this encounter: 174.9 kg.  Code status:   Code Status: Full Code   DVT Prophylaxis: SCDs Start: 11/29/23 0405   Family Communication: Spouse-[phone] left VM 8/21,8/22   Disposition Plan: Status is: Inpatient Remains inpatient appropriate because: Severity of illness   Planned Discharge Destination:Skilled nursing facility   Diet: Diet Order             Diet Heart Room service appropriate? Yes with Assist; Fluid consistency: Thin  Diet effective now                     Data Review:   Inpatient Medications  Scheduled Meds:  (feeding supplement) PROSource Plus  30 mL Oral BID BM   allopurinol   300 mg Oral Daily   atorvastatin   20 mg Oral Daily   Chlorhexidine  Gluconate Cloth  6 each Topical Daily   gabapentin   100 mg Oral TID   Gerhardt's butt cream   Topical BID   leptospermum manuka honey  1 Application Topical Daily   nutrition supplement (JUVEN)  1 packet Oral BID BM   Ensure Max Protein  11 oz Oral Daily   senna-docusate  1 tablet Oral BID   Continuous Infusions:  ceFEPime  (MAXIPIME ) IV Stopped (12/06/23 0700)   magnesium  sulfate bolus IVPB     magnesium  sulfate bolus IVPB 4 g (12/06/23 0924)   PRN Meds:.docusate sodium , HYDROcodone -acetaminophen , metoprolol  tartrate, mouth rinse, polyethylene glycol, sodium chloride , traMADol   DVT Prophylaxis  SCDs Start: 11/29/23 0405   Recent Labs  Lab 12/02/23 0508 12/03/23 0549 12/04/23 0521 12/05/23 0529 12/06/23 0552  WBC 10.6* 10.7* 9.9 9.8 10.1  HGB 7.6* 7.7* 7.7* 7.9* 7.9*  HCT 24.4* 25.2* 25.6* 27.4* 26.6*  PLT 345 358 342 328 334  MCV 88.7 90.0 88.9 93.8 90.2  MCH 27.6 27.5 26.7 27.1 26.8  MCHC 31.1 30.6 30.1 28.8* 29.7*  RDW 16.8* 16.7* 16.8* 17.2* 17.2*  LYMPHSABS  --   --   --   --  1.7  MONOABS  --   --   --   --  0.7  EOSABS  --   --   --   --  0.6*  BASOSABS  --   --   --   --  0.1    Recent Labs   Lab 11/30/23 0339 12/01/23 0317 12/02/23 0508 12/03/23 0549 12/05/23 0625 12/06/23 0552  NA 139 138 141 139  --  138  K 4.1 3.7 3.5 3.7  --  3.8  CL 102 99 100 103  --  103  CO2 27 28 29 27   --  28  ANIONGAP 10 11 12 9   --  7  GLUCOSE 96 115* 106* 98  --  91  BUN 80* 70* 54* 46*  --  41*  CREATININE 2.60* 1.82* 1.21 1.07  --  0.90  INR  --   --   --   --  1.2  --   MG 1.8 1.6* 1.7  --   --  1.2*  PHOS 4.7* 3.4  --   --   --   --   CALCIUM  8.8* 8.7* 8.5* 8.6*  --  8.8*      Recent Labs  Lab 11/30/23 0339 12/01/23 0317 12/02/23 0508 12/03/23 0549 12/05/23 0625 12/06/23 0552  INR  --   --   --   --  1.2  --   MG 1.8 1.6* 1.7  --   --  1.2*  CALCIUM  8.8* 8.7* 8.5* 8.6*  --  8.8*    ---------------------------------------------------------------------------------------------------------------  Lab Results  Component Value Date   CHOL 122 01/13/2021   HDL 66 01/13/2021   LDLCALC 42 01/13/2021   TRIG 63 01/13/2021   CHOLHDL 1.8 11/28/2019    Lab Results  Component Value Date   HGBA1C 5.6 12/04/2020   No results for input(s): TSH, T4TOTAL, FREET4, T3FREE, THYROIDAB in the last 72 hours. No results for input(s): VITAMINB12, FOLATE, FERRITIN, TIBC, IRON, RETICCTPCT in the last 72 hours. ------------------------------------------------------------------------------------------------------------------ Cardiac Enzymes No results for input(s): CKMB, TROPONINI, MYOGLOBIN in the last 168 hours.  Invalid input(s): CK  Micro Results Recent Results (from the past 240 hours)  Blood culture (routine x 2)     Status: None   Collection Time: 11/29/23 12:30 AM   Specimen: BLOOD  Result Value Ref Range Status   Specimen Description BLOOD LEFT ANTECUBITAL  Final   Special Requests   Final    BOTTLES DRAWN AEROBIC AND ANAEROBIC Blood Culture results may not be optimal due to an inadequate volume of blood received in culture bottles   Culture    Final    NO GROWTH 5 DAYS Performed at [facility], [address] [place], [place] [address]    Report Status 12/04/2023 FINAL  Final  Blood culture (routine x 2)     Status: None   Collection Time: 11/29/23 12:35 AM   Specimen: BLOOD  Result Value Ref Range Status   Specimen Description BLOOD LEFT UPPER ARM  Final   Special Requests   Final    BOTTLES DRAWN AEROBIC AND ANAEROBIC Blood Culture results may not be optimal due to an inadequate volume of blood received in culture bottles   Culture   Final    NO GROWTH 5 DAYS Performed at [facility], [address], [place], [place] [address]    Report Status 12/04/2023 FINAL  Final  MRSA Next Gen by PCR, Nasal     Status: None   Collection Time: 11/29/23  5:30 AM   Specimen: Urine, Catheterized; Nasal Swab  Result Value Ref Range Status   MRSA by PCR Next Gen NOT DETECTED NOT DETECTED Final    Comment: (NOTE) The GeneXpert MRSA Assay (FDA approved for NASAL specimens only), is one component of a comprehensive MRSA colonization surveillance program. It is not intended to diagnose MRSA infection nor to guide or monitor treatment for MRSA infections. Test performance is not FDA approved in patients less than 26 years old. Performed at [facility], [address], [place], [place] [address]   Urine Culture     Status: None   Collection Time: 11/29/23  5:38 AM   Specimen: Urine, Random  Result Value Ref Range Status   Specimen Description URINE, RANDOM  Final   Special Requests NONE Reflexed from [id number]  Final   Culture   Final    NO GROWTH Performed at [facility], [address], [place], [place] [address]    Report Status 11/30/2023 FINAL  Final    Radiology Reports  No results found.    Signature  -   Lavada Stank M.D on 12/06/2023 at 10:26 AM   -  To page go to [email or web address]

## 2023-12-06 NOTE — Plan of Care (Signed)

## 2023-12-06 NOTE — TOC Progression Note (Signed)
 Transition of Care [facility]) - Progression Note    Patient Details  Name: Curtis Hill MRN: [id number] Date of Birth: [birth year]  [facility]) CM/SW Contact  Inocente GORMAN Kindle, LCSW Phone Number: 12/06/2023, 5:53 PM  Clinical Narrative:    Per [facility], wife has selected them after tour. CSW will follow up.    Expected Discharge Plan: Skilled Nursing Facility Barriers to Discharge: Continued Medical Work up               Expected Discharge Plan and Services                                               Social Drivers of Health (SDOH) Interventions SDOH Screenings   Food Insecurity: No Food Insecurity (11/30/2023)  Housing: Low Risk  (11/30/2023)  Transportation Needs: Unmet Transportation Needs (11/30/2023)  Utilities: Not At Risk (11/30/2023)  Depression (PHQ2-9): Medium Risk (05/23/2020)  Social Connections: Moderately Integrated (11/30/2023)  Tobacco Use: Low Risk  (11/28/2023)    Readmission Risk Interventions     No data to display

## 2023-12-06 NOTE — Progress Notes (Incomplete)
 Nutrition Follow-up  DOCUMENTATION CODES:   Morbid obesity  INTERVENTION:  Encourage adequate oral intake Meal ordering with assistance Juven BID to support wound healing 30 ml ProSource Plus BID, each supplement provides 100 kcals and 15 grams protein.  Ensure Max po at bedtime, each supplement provides 150 kcal and 30 grams of protein.    NUTRITION DIAGNOSIS:  Increased nutrient needs related to wound healing as evidenced by estimated needs.  GOAL:  Patient will meet greater than or equal to 90% of their needs  MONITOR:  PO intake, Supplement acceptance, Labs, Weight trends, Skin  REASON FOR ASSESSMENT:  Consult Assessment of nutrition requirement/status  ASSESSMENT:  Pt admitted with hematuria. PMH significant for thoracic aortic ascending aneurysm, CAD, pulmonary HTN, HLD, iron deficiency anemia, recently admitted 7/11-7/14 for AMS secondary to sepsis d/t pseudomonal UTI and 8/24-6/2 for sepsis d/t panniculitis    8/18: CT renal stone study- mild bilateral hydroureter, nonobstructing right nephrolithiasis, concern for bladder hemorrhage   Patient is bedbound at baseline (since May) and has been residing at [facility]. Some muscle atrophy likely and a contributor to weight trend and muscle depletions observed on physical exam.   Average Meal Intake 8/19: 50% x1 documented meal  Per last RD note, patient does not prefer food at SNF and, therefore, some true body weight loss d/t less oral intake also likely.   Admit Weight: 196.5 kg Current Weight: 174.9 kg  Weight trend up since admission. Some notable fluctuations  Net IO Since Admission: -7,488.91 mL [12/06/23 0947]    Drains/Lines: Foley catheter (chronic) UOP: 2375 ml x24 hours  BUN down trending. Sodium and potassium have nomalized. Did require magnesium  supplementation today.  Meds: senna-docusate, IV ABX, IV Mg sulfate x1  Labs: Na+ 130--->138 (wdl) K+ 6.8--->3.8 (wdl) Mg 1.2 (L) BUN 54>46>41  (H) CBGs 91-98 x24 hours   Diet Order:   Diet Order             Diet Heart Room service appropriate? Yes with Assist; Fluid consistency: Thin  Diet effective now             EDUCATION NEEDS:  Not appropriate for education at this time  Skin:  Skin Assessment: Skin Integrity Issues: Skin Integrity Issues:: Stage II Stage II: Per WOC (8/19) 1. Full thickness wound R pannus/abdominal fold 2. Stage 3 Pressure Injury Sacrum 3. Intertriginous dermatitis pannus  Last BM:  8/18 type 2 small  Height:  Ht Readings from Last 1 Encounters:  12/06/23 5' 2 (1.575 m)   Weight:  Wt Readings from Last 1 Encounters:  12/05/23 (!) 174.9 kg   Ideal Body Weight:  67.3 kg  BMI:  Body mass index is 70.52 kg/m.  Estimated Nutritional Needs:   Kcal:  1800-2000  Protein:  125-140g  Fluid:  >/=1.8L  Blair Deaner MS, RD, LDN Registered Dietitian Clinical Nutrition RD Inpatient Contact Info in Amion

## 2023-12-06 NOTE — Plan of Care (Signed)

## 2023-12-07 DIAGNOSIS — R310 Gross hematuria: Secondary | ICD-10-CM | POA: Diagnosis not present

## 2023-12-07 LAB — BASIC METABOLIC PANEL WITH GFR
Anion gap: 8 (ref 5–15)
BUN: 33 mg/dL — ABNORMAL HIGH (ref 8–23)
CO2: 28 mmol/L (ref 22–32)
Calcium: 8.9 mg/dL (ref 8.9–10.3)
Chloride: 99 mmol/L (ref 98–111)
Creatinine, Ser: 1.01 mg/dL (ref 0.61–1.24)
GFR, Estimated: >60 mL/min (ref >60)
Glucose, Bld: 97 mg/dL (ref 70–99)
Potassium: 4.1 mmol/L (ref 3.5–5.1)
Sodium: 135 mmol/L (ref 135–145)

## 2023-12-07 LAB — MAGNESIUM: Magnesium: 1.8 mg/dL (ref 1.7–2.4)

## 2023-12-07 NOTE — Progress Notes (Signed)
 Subjective: CBI clamped overnight.  No hematuria this morning.  Reviewed case and plan with hospitalist, nursing, and patient at bedside.  Urine remains clear yellow today. Objective: Vital signs in last 24 hours: Temp:  [97.7 F (36.5 C)-98.9 F (37.2 C)] 97.7 F (36.5 C) (08/26 0820) Pulse Rate:  [81-114] 81 (08/26 0820) Resp:  [14-19] 18 (08/26 0820) BP: (107-129)/(62-84) 129/82 (08/26 0820) SpO2:  [94 %-99 %] 96 % (08/26 0820)  Assessment/Plan: # Hematuria # Emphysematous cystitis # Morbid obesity  CBI clamped on the morning of 11/30/2023.  Urine still clear the morning of 8/20.  Discontinue CBI.  NGTD on UCx but Pseudomonas and Proteus positive on 10/22/2023 culture and sensitivity.  Would recommend at least one week of treatment for emphysematous cystitis.  Repeat CT collected.  Awaiting official read but gas in bladder wall appears to have cleared.  Patient has a large pelvic stool ball that is compressing the prostate and will increase his chances of failing his voiding trial.  I have added in some scheduled MiraLAX  and he will need to clear his constipation prior to voiding trial   External beam radiation and radioactive seed placement 12/11/2010.  Urology will follow.  Intake/Output from previous day: 08/25 0701 - 08/26 0700 In: 1560 [P.O.:1560] Out: 2100 [Urine:2100]  Intake/Output this shift: No intake/output data recorded.  Physical Exam:  General: Alert and oriented CV: No cyanosis Lungs: equal chest rise Abdomen: Soft, NTND, no rebound or guarding Gu: 8f three-way in place with CBI port plugged  Lab Results: Recent Labs    12/05/23 0529 12/06/23 0552  HGB 7.9* 7.9*  HCT 27.4* 26.6*   BMET Recent Labs    12/05/23 0529 12/06/23 0552  NA  --  138  K  --  3.8  CL  --  103  CO2  --  28  GLUCOSE  --  91  BUN  --  41*  CREATININE  --  0.90  CALCIUM   --  8.8*  HGB 7.9* 7.9*  WBC 9.8 10.1     Studies/Results: CT ABDOMEN PELVIS WO  CONTRAST Result Date: 12/06/2023 CLINICAL DATA:  Gross hematuria EXAM: CT ABDOMEN AND PELVIS WITHOUT CONTRAST TECHNIQUE: Multidetector CT imaging of the abdomen and pelvis was performed following the standard protocol without IV contrast. RADIATION DOSE REDUCTION: This exam was performed according to the departmental dose-optimization program which includes automated exposure control, adjustment of the mA and/or kV according to patient size and/or use of iterative reconstruction technique. COMPARISON:  11/29/2023 FINDINGS: Lower chest: Moderate cardiomegaly. Aortic valve calcification. Thoracic aortic atherosclerosis. Scarring or atelectasis in the right middle lobe with asymmetric airway thickening and some airway plugging in the right lower lobe. Hepatobiliary: Stable lobulated hepatic morphology with prominent caudate lobe and atrophic lateral segment left hepatic lobe, appearance favors and degenerative disc disease with levoconvex lumbar scoliosis. Cirrhosis. Pancreas: Unremarkable Spleen: Unremarkable Adrenals/Urinary Tract: Bilateral renal cysts including a Bosniak category 2 cyst of the right kidney on image 39 of series 3 which was previously fluid density on prior exams and accordingly considered benign. Tiny peripheral Bosniak category 2 cyst of the left kidney lower pole anteriorly with internal density of 90 Hounsfield units on image 53 series 5, benign. No further imaging workup of these lesions is indicated. Adrenal glands unremarkable. Empty urinary bladder with Foley catheter in place. Mild stranding of the urinary bladder wall could reflect cystitis but the previous gas in the urinary bladder wall has completely resolved. No significant hydronephrosis or  hydroureter. 3 mm right mid upper kidney nonobstructive renal calculus, image 98 series 6. Stomach/Bowel: Sigmoid colon diverticulosis. Vascular/Lymphatic: Atherosclerosis is present, including aortoiliac atherosclerotic disease. Reproductive:  Brachytherapy seeds in the prostate gland. Poor characterization of the penis and scrotal sac partially related to posterior positioning and possible hydroceles, recommend correlation with visual inspection. Possible inflammatory stranding in the fatty tissues of the perineum/pubis which extend down below the inferior margin of imaging, visual inspection of the perineum recommended. Other: No supplemental non-categorized findings. Musculoskeletal: Lax abdominal wall musculature. Prominent overhanging pannus including the anterior abdominal wall, pubis, and perineum. Lower lumbar spondylosis IMPRESSION: 1. Mild stranding of the urinary bladder wall could reflect cystitis but the previous gas in the urinary bladder wall has completely resolved. Foley catheter in place. 2. 3 mm right mid upper kidney nonobstructive renal calculus. 3. Poor characterization of the penis and scrotal sac partially related to posterior positioning and possible hydroceles, recommend correlation with visual inspection. 4. Possible inflammatory stranding in the fatty tissues of the perineum/pubis which extend down below the inferior margin of imaging, visual inspection of the perineum recommended. 5. Scarring or atelectasis in the right middle lobe with asymmetric airway thickening and some airway plugging in the right lower lobe. 6. Sigmoid colon diverticulosis. 7. Brachytherapy seeds in the prostate gland. 8. Lax abdominal wall musculature with prominent overhanging pannus including the anterior abdominal wall, pubis, and perineum. 9.  Aortic Atherosclerosis (ICD10-I70.0). Electronically Signed   By: Ryan Salvage M.D.   On: 12/06/2023 12:10       LOS: 8 days   Ole Bourdon, NP [facility] Pager: [phone]  12/07/2023, 8:51 AM

## 2023-12-07 NOTE — TOC Progression Note (Signed)
 Transition of Care [facility]) - Progression Note    Patient Details  Name: Curtis Hill MRN: [id number] Date of Birth: 01-01-1946  [facility]) CM/SW Contact  Inocente GORMAN Kindle, LCSW Phone Number: 12/07/2023, 1:15 PM  Clinical Narrative:    CSW updated patient's spouse and [facility].    Expected Discharge Plan: Skilled Nursing Facility Barriers to Discharge: Continued Medical Work up               Expected Discharge Plan and Services                                               Social Drivers of Health (SDOH) Interventions SDOH Screenings   Food Insecurity: No Food Insecurity (11/30/2023)  Housing: Low Risk  (11/30/2023)  Transportation Needs: Unmet Transportation Needs (11/30/2023)  Utilities: Not At Risk (11/30/2023)  Depression (PHQ2-9): Medium Risk (05/23/2020)  Social Connections: Moderately Integrated (11/30/2023)  Tobacco Use: Low Risk  (11/28/2023)    Readmission Risk Interventions     No data to display

## 2023-12-07 NOTE — Progress Notes (Signed)
 PROGRESS NOTE        PATIENT DETAILS Name: Curtis Hill Age: 78 y.o. Sex: male Date of Birth: December 01, 1945 Admit Date: 11/28/2023 Admitting Physician Harlene Na, DO ERE:Mndd, Carlin Redbird, MD  Brief Summary: Patient is a 78 y.o.  male with history of morbid obesity, prostate cancer-s/p TURBT 2012, OSA on CPAP, HTN, HLD-who presented from SNF for hematuria-upon further evaluation-patient was found to have septic shock secondary to hemorrhagic/emphysematous cystitis. Patient was admitted to the ICU-stabilized and subsequently transferred to [facility] on 8/20.    Significant events: 8/17>> to ED from SNF-hematuria-septic shock-hemorrhagic cystitis/emphysematous cystitis on CT imaging-unable to place Foley-urology consulted-underwent cystoscopy/urethral meatal dilatation and Foley catheter placement.  Admit to ICU for pressors. 8/20>> transferred to [facility]  Significant studies: 7/12>> echo: EF 60-65% 8/18>> CT abdomen/pelvis: Emphysematous/hemorrhagic cystitis.  Significant microbiology data: 7/11>> urine culture: Pseudomonas/Proteus. 8/18>> urine culture: No growth 8/18>> blood culture: No growth  Procedures: 8/18>> urethral dilatation-cystoscopy-Foley catheter placement by ED.  Consults: Urology PCCM Palliative care  Subjective:  Patient in bed, appears comfortable, denies any headache, no fever, no chest pain or pressure, no shortness of breath , no abdominal pain. No new focal weakness.   Objective: Vitals: Blood pressure 118/81, pulse 81, temperature 98.1 F (36.7 C), temperature source Oral, resp. rate 17, height 5' 2 (1.575 m), weight (!) 174.9 kg, SpO2 95%.   Exam:  Awake Alert, No new F.N deficits, Normal affect [place].AT,PERRAL Supple Neck, No JVD,   Symmetrical Chest wall movement, Good air movement bilaterally, CTAB RRR,No Gallops, Rubs or new Murmurs,  +ve B.Sounds, Abd Soft, No tenderness, large pannus, Foley in place No Cyanosis,  Clubbing or edema    Assessment/Plan:  Septic shock secondary to hemorrhagic and emphysematous cystitis Sepsis physiology has resolved All cultures negative so far Recent urine cultures positive for Pseudomonas/Proteus-currently on IV cephalosporin.  Hemorrhagic/emphysematous cystitis See above regarding antibiotics Hematuria has resolved Urology recommending repeat CT on 8/25-and then decide whether to remove Foley catheter or not.  AKI Hemodynamically mediated-in the setting of septic shock AKI has resolved with treatment of underlying infectious issues.  Hypomagnesemia.  Replaced   anemia Multifactorial-secondary to acute blood loss in the setting of hematuria-and acute/critical illness Hematuria has resolved-sepsis physiology improved-overall clinically improving Hb levels fluctuating-will continue to monitor Transfuse if Hb<7.  History of CAD Thoracic aortic aneurysm No anginal symptoms PCP to continue to follow/surveillance of aneurysm.  HTN All antihypertensives remain on hold-resume when BP is more elevated.  HLD Statin  History of pulmonary hypertension Supportive care As needed diuretics if develops volume overload  Bronchial asthma Not in exacerbation As needed bronchodilators.  OSA on CPAP Chronic hypoxic respiratory failure on 3 L of oxygen New CPAP/O2 Supportive care  GERD PPI  Neuropathy Neurontin   Gout No flare Allopurinol   Debility/deconditioning Mostly bedbound status for almost a month-minimally mobile before that-with the help of a walker PT/OT eval SNF planned on discharge.  Nutrition Status: Nutrition Problem: Increased nutrient needs Etiology: wound healing Signs/Symptoms: estimated needs Interventions: Juven, Prostat, Refer to RD note for recommendations  Class 3 Obesity Apparently on Ozempic as an outpatient Has a large pannus in his lower abdomen down to his lower thighs  Estimated body mass index is 70.52 kg/m as  calculated from the following:   Height as of this encounter: 5' 2 (1.575 m).   Weight as of this  encounter: 174.9 kg.   Code status:   Code Status: Full Code   DVT Prophylaxis: SCDs Start: 11/29/23 0405   Family Communication: Spouse-Shirley-[phone] left VM 8/21,8/22   Disposition Plan: Status is: Inpatient Remains inpatient appropriate because: Severity of illness   Planned Discharge Destination:Skilled nursing facility   Diet: Diet Order             Diet Heart Room service appropriate? Yes with Assist; Fluid consistency: Thin  Diet effective now                     Data Review:   Inpatient Medications  Scheduled Meds:  (feeding supplement) PROSource Plus  30 mL Oral BID BM   allopurinol   300 mg Oral Daily   atorvastatin   20 mg Oral Daily   Chlorhexidine  Gluconate Cloth  6 each Topical Daily   gabapentin   100 mg Oral TID   Gerhardt's butt cream   Topical BID   leptospermum manuka honey  1 Application Topical Daily   nutrition supplement (JUVEN)  1 packet Oral BID BM   polyethylene glycol  17 g Oral Daily   Ensure Max Protein  11 oz Oral Daily   senna-docusate  1 tablet Oral BID   Continuous Infusions:  ceFEPime  (MAXIPIME ) IV 2 g (12/06/23 2329)   PRN Meds:.HYDROcodone -acetaminophen , metoprolol  tartrate, mouth rinse, sodium chloride , traMADol   DVT Prophylaxis  SCDs Start: 11/29/23 0405   Recent Labs  Lab 12/02/23 0508 12/03/23 0549 12/04/23 0521 12/05/23 0529 12/06/23 0552  WBC 10.6* 10.7* 9.9 9.8 10.1  HGB 7.6* 7.7* 7.7* 7.9* 7.9*  HCT 24.4* 25.2* 25.6* 27.4* 26.6*  PLT 345 358 342 328 334  MCV 88.7 90.0 88.9 93.8 90.2  MCH 27.6 27.5 26.7 27.1 26.8  MCHC 31.1 30.6 30.1 28.8* 29.7*  RDW 16.8* 16.7* 16.8* 17.2* 17.2*  LYMPHSABS  --   --   --   --  1.7  MONOABS  --   --   --   --  0.7  EOSABS  --   --   --   --  0.6*  BASOSABS  --   --   --   --  0.1    Recent Labs  Lab 12/01/23 0317 12/02/23 0508 12/03/23 0549 12/05/23 0625  12/06/23 0552  NA 138 141 139  --  138  K 3.7 3.5 3.7  --  3.8  CL 99 100 103  --  103  CO2 28 29 27   --  28  ANIONGAP 11 12 9   --  7  GLUCOSE 115* 106* 98  --  91  BUN 70* 54* 46*  --  41*  CREATININE 1.82* 1.21 1.07  --  0.90  INR  --   --   --  1.2  --   MG 1.6* 1.7  --   --  1.2*  PHOS 3.4  --   --   --   --   CALCIUM  8.7* 8.5* 8.6*  --  8.8*      Recent Labs  Lab 12/01/23 0317 12/02/23 0508 12/03/23 0549 12/05/23 0625 12/06/23 0552  INR  --   --   --  1.2  --   MG 1.6* 1.7  --   --  1.2*  CALCIUM  8.7* 8.5* 8.6*  --  8.8*    --------------------------------------------------------------------------------------------------------------- Lab Results  Component Value Date   CHOL 122 01/13/2021   HDL 66 01/13/2021   LDLCALC 42 01/13/2021   TRIG 63 01/13/2021  CHOLHDL 1.8 11/28/2019    Lab Results  Component Value Date   HGBA1C 5.6 12/04/2020   No results for input(s): TSH, T4TOTAL, FREET4, T3FREE, THYROIDAB in the last 72 hours. No results for input(s): VITAMINB12, FOLATE, FERRITIN, TIBC, IRON, RETICCTPCT in the last 72 hours. ------------------------------------------------------------------------------------------------------------------ Cardiac Enzymes No results for input(s): CKMB, TROPONINI, MYOGLOBIN in the last 168 hours.  Invalid input(s): CK  Micro Results Recent Results (from the past 240 hours)  Blood culture (routine x 2)     Status: None   Collection Time: 11/29/23 12:30 AM   Specimen: BLOOD  Result Value Ref Range Status   Specimen Description BLOOD LEFT ANTECUBITAL  Final   Special Requests   Final    BOTTLES DRAWN AEROBIC AND ANAEROBIC Blood Culture results may not be optimal due to an inadequate volume of blood received in culture bottles   Culture   Final    NO GROWTH 5 DAYS Performed at [facility], [address] [place], [place] [address]    Report Status 12/04/2023 FINAL  Final  Blood culture  (routine x 2)     Status: None   Collection Time: 11/29/23 12:35 AM   Specimen: BLOOD  Result Value Ref Range Status   Specimen Description BLOOD LEFT UPPER ARM  Final   Special Requests   Final    BOTTLES DRAWN AEROBIC AND ANAEROBIC Blood Culture results may not be optimal due to an inadequate volume of blood received in culture bottles   Culture   Final    NO GROWTH 5 DAYS Performed at [facility], [address] [place], [place] [address]    Report Status 12/04/2023 FINAL  Final  MRSA Next Gen by PCR, Nasal     Status: None   Collection Time: 11/29/23  5:30 AM   Specimen: Urine, Catheterized; Nasal Swab  Result Value Ref Range Status   MRSA by PCR Next Gen NOT DETECTED NOT DETECTED Final    Comment: (NOTE) The GeneXpert MRSA Assay (FDA approved for NASAL specimens only), is one component of a comprehensive MRSA colonization surveillance program. It is not intended to diagnose MRSA infection nor to guide or monitor treatment for MRSA infections. Test performance is not FDA approved in patients less than 9 years old. Performed at [facility], [address], [place], [place] [address]   Urine Culture     Status: None   Collection Time: 11/29/23  5:38 AM   Specimen: Urine, Random  Result Value Ref Range Status   Specimen Description URINE, RANDOM  Final   Special Requests NONE Reflexed from [id number]  Final   Culture   Final    NO GROWTH Performed at [facility], [address] [place], [place] [address]    Report Status 11/30/2023 FINAL  Final    Radiology Reports  CT ABDOMEN PELVIS WO CONTRAST Result Date: 12/06/2023 CLINICAL DATA:  Gross hematuria EXAM: CT ABDOMEN AND PELVIS WITHOUT CONTRAST TECHNIQUE: Multidetector CT imaging of the abdomen and pelvis was performed following the standard protocol without IV contrast. RADIATION DOSE REDUCTION: This exam was performed according to the departmental dose-optimization program which includes automated  exposure control, adjustment of the mA and/or kV according to patient size and/or use of iterative reconstruction technique. COMPARISON:  11/29/2023 FINDINGS: Lower chest: Moderate cardiomegaly. Aortic valve calcification. Thoracic aortic atherosclerosis. Scarring or atelectasis in the right middle lobe with asymmetric airway thickening and some airway plugging in the right lower lobe. Hepatobiliary:  Stable lobulated hepatic morphology with prominent caudate lobe and atrophic lateral segment left hepatic lobe, appearance favors and degenerative disc disease with levoconvex lumbar scoliosis. Cirrhosis. Pancreas: Unremarkable Spleen: Unremarkable Adrenals/Urinary Tract: Bilateral renal cysts including a Bosniak category 2 cyst of the right kidney on image 39 of series 3 which was previously fluid density on prior exams and accordingly considered benign. Tiny peripheral Bosniak category 2 cyst of the left kidney lower pole anteriorly with internal density of 90 Hounsfield units on image 53 series 5, benign. No further imaging workup of these lesions is indicated. Adrenal glands unremarkable. Empty urinary bladder with Foley catheter in place. Mild stranding of the urinary bladder wall could reflect cystitis but the previous gas in the urinary bladder wall has completely resolved. No significant hydronephrosis or hydroureter. 3 mm right mid upper kidney nonobstructive renal calculus, image 98 series 6. Stomach/Bowel: Sigmoid colon diverticulosis. Vascular/Lymphatic: Atherosclerosis is present, including aortoiliac atherosclerotic disease. Reproductive: Brachytherapy seeds in the prostate gland. Poor characterization of the penis and scrotal sac partially related to posterior positioning and possible hydroceles, recommend correlation with visual inspection. Possible inflammatory stranding in the fatty tissues of the perineum/pubis which extend down below the inferior margin of imaging, visual inspection of the perineum  recommended. Other: No supplemental non-categorized findings. Musculoskeletal: Lax abdominal wall musculature. Prominent overhanging pannus including the anterior abdominal wall, pubis, and perineum. Lower lumbar spondylosis IMPRESSION: 1. Mild stranding of the urinary bladder wall could reflect cystitis but the previous gas in the urinary bladder wall has completely resolved. Foley catheter in place. 2. 3 mm right mid upper kidney nonobstructive renal calculus. 3. Poor characterization of the penis and scrotal sac partially related to posterior positioning and possible hydroceles, recommend correlation with visual inspection. 4. Possible inflammatory stranding in the fatty tissues of the perineum/pubis which extend down below the inferior margin of imaging, visual inspection of the perineum recommended. 5. Scarring or atelectasis in the right middle lobe with asymmetric airway thickening and some airway plugging in the right lower lobe. 6. Sigmoid colon diverticulosis. 7. Brachytherapy seeds in the prostate gland. 8. Lax abdominal wall musculature with prominent overhanging pannus including the anterior abdominal wall, pubis, and perineum. 9.  Aortic Atherosclerosis (ICD10-I70.0). Electronically Signed   By: Ryan Salvage M.D.   On: 12/06/2023 12:10      Signature  -   Lavada Stank M.D on 12/07/2023 at 8:10 AM   -  To page go to [email or web address]

## 2023-12-07 NOTE — Plan of Care (Signed)
  Problem: Education: Goal: Knowledge of General Education information will improve Description: Including pain rating scale, medication(s)/side effects and non-pharmacologic comfort measures Outcome: Progressing   Problem: Clinical Measurements: Goal: Ability to maintain clinical measurements within normal limits will improve Outcome: Progressing   Problem: Nutrition: Goal: Adequate nutrition will be maintained Outcome: Progressing   Problem: Coping: Goal: Level of anxiety will decrease Outcome: Progressing   Problem: Elimination: Goal: Will not experience complications related to bowel motility Outcome: Progressing   Problem: Pain Managment: Goal: General experience of comfort will improve and/or be controlled Outcome: Progressing   Problem: Safety: Goal: Ability to remain free from injury will improve Outcome: Progressing   Problem: Skin Integrity: Goal: Risk for impaired skin integrity will decrease Outcome: Progressing

## 2023-12-07 NOTE — Progress Notes (Signed)
 Subjective: NAEON.  Still attempting to address constipation.  Foley catheter draining clear yellow urine.  Patient looks well today, reviewed case and plan  Objective: Vital signs in last 24 hours: Temp:  [97.7 F (36.5 C)-98.9 F (37.2 C)] 98.2 F (36.8 C) (08/26 1200) Pulse Rate:  [81-112] 106 (08/26 1200) Resp:  [14-19] 18 (08/26 1200) BP: (107-129)/(62-84) 112/78 (08/26 1200) SpO2:  [94 %-99 %] 98 % (08/26 1200)  Assessment/Plan: # Hematuria # Emphysematous cystitis # Morbid obesity  CBI clamped on the morning of 11/30/2023.  Urine still clear the morning of 8/20.  Discontinue CBI.  NGTD on UCx but Pseudomonas and Proteus positive on 10/22/2023 culture and sensitivity.  Would recommend at least one week of treatment for emphysematous cystitis.  Repeat CT collected.  Gas in bladder wall is clear.  Foley catheter is no longer necessary from the perspective of the emphysematous cystitis.  However, patient has a significant amount of stool burden which is pressing in on the prostate and bladder neck.  We titrated his laxatives up yesterday to which she has not yet shown response.  Shared decision was made to continue on this course through till tomorrow morning and then proceed with more aggressive measures like mag citrate  External beam radiation and radioactive seed placement 12/11/2010.  Urology will follow.  Intake/Output from previous day: 08/25 0701 - 08/26 0700 In: 1560 [P.O.:1560] Out: 2100 [Urine:2100]  Intake/Output this shift: Total I/O In: 480 [P.O.:480] Out: 1000 [Urine:1000]  Physical Exam:  General: Alert and oriented CV: No cyanosis Lungs: equal chest rise Abdomen: Soft, NTND, no rebound or guarding Gu: 106f three-way in place with CBI port plugged  Lab Results: Recent Labs    12/05/23 0529 12/06/23 0552  HGB 7.9* 7.9*  HCT 27.4* 26.6*   BMET Recent Labs    12/05/23 0529 12/06/23 0552 12/07/23 0841  NA  --  138 135  K  --  3.8 4.1  CL   --  103 99  CO2  --  28 28  GLUCOSE  --  91 97  BUN  --  41* 33*  CREATININE  --  0.90 1.01  CALCIUM   --  8.8* 8.9  HGB 7.9* 7.9*  --   WBC 9.8 10.1  --      Studies/Results: CT ABDOMEN PELVIS WO CONTRAST Result Date: 12/06/2023 CLINICAL DATA:  Gross hematuria EXAM: CT ABDOMEN AND PELVIS WITHOUT CONTRAST TECHNIQUE: Multidetector CT imaging of the abdomen and pelvis was performed following the standard protocol without IV contrast. RADIATION DOSE REDUCTION: This exam was performed according to the departmental dose-optimization program which includes automated exposure control, adjustment of the mA and/or kV according to patient size and/or use of iterative reconstruction technique. COMPARISON:  11/29/2023 FINDINGS: Lower chest: Moderate cardiomegaly. Aortic valve calcification. Thoracic aortic atherosclerosis. Scarring or atelectasis in the right middle lobe with asymmetric airway thickening and some airway plugging in the right lower lobe. Hepatobiliary: Stable lobulated hepatic morphology with prominent caudate lobe and atrophic lateral segment left hepatic lobe, appearance favors and degenerative disc disease with levoconvex lumbar scoliosis. Cirrhosis. Pancreas: Unremarkable Spleen: Unremarkable Adrenals/Urinary Tract: Bilateral renal cysts including a Bosniak category 2 cyst of the right kidney on image 39 of series 3 which was previously fluid density on prior exams and accordingly considered benign. Tiny peripheral Bosniak category 2 cyst of the left kidney lower pole anteriorly with internal density of 90 Hounsfield units on image 53 series 5, benign. No further imaging workup of  these lesions is indicated. Adrenal glands unremarkable. Empty urinary bladder with Foley catheter in place. Mild stranding of the urinary bladder wall could reflect cystitis but the previous gas in the urinary bladder wall has completely resolved. No significant hydronephrosis or hydroureter. 3 mm right mid upper kidney  nonobstructive renal calculus, image 98 series 6. Stomach/Bowel: Sigmoid colon diverticulosis. Vascular/Lymphatic: Atherosclerosis is present, including aortoiliac atherosclerotic disease. Reproductive: Brachytherapy seeds in the prostate gland. Poor characterization of the penis and scrotal sac partially related to posterior positioning and possible hydroceles, recommend correlation with visual inspection. Possible inflammatory stranding in the fatty tissues of the perineum/pubis which extend down below the inferior margin of imaging, visual inspection of the perineum recommended. Other: No supplemental non-categorized findings. Musculoskeletal: Lax abdominal wall musculature. Prominent overhanging pannus including the anterior abdominal wall, pubis, and perineum. Lower lumbar spondylosis IMPRESSION: 1. Mild stranding of the urinary bladder wall could reflect cystitis but the previous gas in the urinary bladder wall has completely resolved. Foley catheter in place. 2. 3 mm right mid upper kidney nonobstructive renal calculus. 3. Poor characterization of the penis and scrotal sac partially related to posterior positioning and possible hydroceles, recommend correlation with visual inspection. 4. Possible inflammatory stranding in the fatty tissues of the perineum/pubis which extend down below the inferior margin of imaging, visual inspection of the perineum recommended. 5. Scarring or atelectasis in the right middle lobe with asymmetric airway thickening and some airway plugging in the right lower lobe. 6. Sigmoid colon diverticulosis. 7. Brachytherapy seeds in the prostate gland. 8. Lax abdominal wall musculature with prominent overhanging pannus including the anterior abdominal wall, pubis, and perineum. 9.  Aortic Atherosclerosis (ICD10-I70.0). Electronically Signed   By: Ryan Salvage M.D.   On: 12/06/2023 12:10       LOS: 8 days   Ole Bourdon, NP [facility] Pager: [phone]  12/07/2023, 1:48 PM

## 2023-12-07 NOTE — Plan of Care (Signed)

## 2023-12-07 NOTE — Progress Notes (Signed)
 Occupational Therapy Treatment Patient Details Name: Curtis Hill MRN: [id number] DOB: 12-May-1945 Today's Date: 12/07/2023   History of present illness 78 year old man male with admittied from SNF with gross hematuria, urinary retention and AKI.  He has had 3 admissions in the past 6 months, most recently from 7/11 to 10/25/2023 for altered mental status secondary to sepsis due to pseudomonal UTI.  He was also hospitalized with sepsis due to panniculitis from 5/24-09/13/23. Pt with history of prostate cancer, thoracic aortic ascending aneurysm, CAD, pulmonary hypertension, hyperlipidemia, hypertension, asthma, iron deficiency, and morbid obesity, right foot pain.   OT comments  Patient received in supine and agreeable to OT treatment. Patient performed grooming tasks and UE strengthening before getting to EOB. Patient required mod assist +2 for supine to sitting on EOB with patient tolerating 5 minutes on EOB before complaining of increased pain due to bed rail and pannus. Patient required max assist +2 to return to supine and performed rolling in bed to address positioning and cleaning due to small BM.  Patient will benefit from continued inpatient follow up therapy, <3 hours/day.  Acute OT to continue to follow to address established goals to facilitate DC to next venue of care.        If plan is discharge home, recommend the following:  Two people to help with walking and/or transfers;Two people to help with bathing/dressing/bathroom;Help with stairs or ramp for entrance;Assist for transportation   Equipment Recommendations  Other (comment) (TBD next venue of care)    Recommendations for Other Services      Precautions / Restrictions Precautions Precautions: Fall Recall of Precautions/Restrictions: Impaired Precaution/Restrictions Comments: Large pannus, painful right foot with weightbearing; watch HR Restrictions Weight Bearing Restrictions Per Provider Order: No       Mobility Bed  Mobility Overal bed mobility: Needs Assistance Bed Mobility: Rolling, Supine to Sit, Sit to Supine Rolling: Mod assist, Max assist   Supine to sit: Mod assist, +2 for physical assistance, HOB elevated, Used rails Sit to supine: +2 for safety/equipment, +2 for physical assistance, Max assist   General bed mobility comments: rolling side to side to address positioning in bed and cleaning following BM with more assistance needed to roll to left.  Able to assist with getting to EOB with raising trunk and scooting but requires assist of 2 to complete    Transfers                   General transfer comment: not attempted     Balance Overall balance assessment: Needs assistance Sitting-balance support: Bilateral upper extremity supported, Single extremity supported Sitting balance-Leahy Scale: Fair Sitting balance - Comments: at least one extremity support on EOB with patient most comfortable with 2 UE support                                   ADL either performed or assessed with clinical judgement   ADL Overall ADL's : Needs assistance/impaired     Grooming: Wash/dry hands;Wash/dry face;Oral care;Set up;Bed level       Lower Body Bathing: Total assistance;Bed level                         General ADL Comments: focused on sitting on EOB    Extremity/Trunk Assessment              Vision  Perception     Praxis     Communication Communication Communication: No apparent difficulties   Cognition Arousal: Alert Behavior During Therapy: Flat affect Cognition: No apparent impairments                               Following commands: Intact        Cueing   Cueing Techniques: Verbal cues, Gestural cues, Tactile cues  Exercises Exercises: General Upper Extremity General Exercises - Upper Extremity Shoulder Flexion: Strengthening, Both, 10 reps, Theraband, Supine Theraband Level (Shoulder Flexion): Level 2  (Red) Shoulder ABduction: Strengthening, Both, 15 reps, Supine, Theraband Theraband Level (Shoulder Abduction): Level 2 (Red)    Shoulder Instructions       General Comments      Pertinent Vitals/ Pain       Pain Assessment Pain Assessment: Faces Faces Pain Scale: Hurts even more Pain Location: right foot, RLE, pannus, Pain Descriptors / Indicators: Discomfort, Grimacing, Guarding Pain Intervention(s): Limited activity within patient's tolerance, Monitored during session, Repositioned  Home Living                                          Prior Functioning/Environment              Frequency  Min 1X/week        Progress Toward Goals  OT Goals(current goals can now be found in the care plan section)  Progress towards OT goals: Progressing toward goals  Acute Rehab OT Goals Patient Stated Goal: get stronger OT Goal Formulation: With patient Time For Goal Achievement: 12/14/23 Potential to Achieve Goals: Fair ADL Goals Pt Will Perform Grooming: with set-up;sitting (Unsupported EOB for 3 tasks without LOB) Pt Will Perform Upper Body Bathing: with set-up;sitting (Unsupport EOB) Pt Will Perform Lower Body Bathing: with max assist;sitting/lateral leans;bed level;with adaptive equipment (sit to supine with AE for reaching BLE's) Additional ADL Goal #1: Pt will complete supine to sit with mod assist and HOB no greater than 45 degrees in preparation for selfcare tasks or sit to stand attempts. Additional ADL Goal #2: Pt will complete sit to stand from elevated EOB with max +2 in preparation for LB selfcare tasks and functional transfers.  Plan      Co-evaluation                 AM-PAC OT 6 Clicks Daily Activity     Outcome Measure   Help from another person eating meals?: None Help from another person taking care of personal grooming?: A Little Help from another person toileting, which includes using toliet, bedpan, or urinal?: Total Help  from another person bathing (including washing, rinsing, drying)?: Total Help from another person to put on and taking off regular upper body clothing?: A Little Help from another person to put on and taking off regular lower body clothing?: Total 6 Click Score: 13    End of Session    OT Visit Diagnosis: Muscle weakness (generalized) (M62.81);Pain;Unsteadiness on feet (R26.81);Other abnormalities of gait and mobility (R26.89);Dizziness and giddiness (R42) Pain - Right/Left: Right Pain - part of body: Ankle and joints of foot   Activity Tolerance Patient limited by pain   Patient Left in bed;with call bell/phone within reach   Nurse Communication Mobility status        Time: 8897-8853 OT Time Calculation (min): 44  min  Charges: OT General Charges $OT Visit: 1 Visit OT Treatments $Self Care/Home Management : 8-22 mins $Therapeutic Activity: 8-22 mins $Therapeutic Exercise: 8-22 mins  Dick Laine, OTA Acute Rehabilitation Services  Office [phone]   Jeb LITTIE Laine 12/07/2023, 12:34 PM

## 2023-12-08 DIAGNOSIS — R310 Gross hematuria: Secondary | ICD-10-CM | POA: Diagnosis not present

## 2023-12-08 MED ORDER — SODIUM CHLORIDE 0.9 % IV SOLN
2.0000 g | Freq: Two times a day (BID) | INTRAVENOUS | Status: AC
  Administered 2023-12-08: 2 g via INTRAVENOUS
  Filled 2023-12-08: qty 12.5

## 2023-12-08 MED ORDER — ADULT MULTIVITAMIN W/MINERALS CH
1.0000 | ORAL_TABLET | Freq: Every day | ORAL | Status: DC
  Administered 2023-12-08 – 2023-12-10 (×3): 1 via ORAL
  Filled 2023-12-08 (×3): qty 1

## 2023-12-08 MED ORDER — SMOG ENEMA
400.0000 mL | Freq: Once | RECTAL | Status: AC
  Administered 2023-12-08: 400 mL via RECTAL
  Filled 2023-12-08: qty 960

## 2023-12-08 MED ORDER — NALOXEGOL OXALATE 25 MG PO TABS
25.0000 mg | ORAL_TABLET | Freq: Every day | ORAL | Status: DC
  Administered 2023-12-08 – 2023-12-10 (×3): 25 mg via ORAL
  Filled 2023-12-08 (×3): qty 1

## 2023-12-08 MED ORDER — HEPARIN SODIUM (PORCINE) 5000 UNIT/ML IJ SOLN
5000.0000 [IU] | Freq: Three times a day (TID) | INTRAMUSCULAR | Status: DC
  Administered 2023-12-08 – 2023-12-10 (×6): 5000 [IU] via SUBCUTANEOUS
  Filled 2023-12-08 (×6): qty 1

## 2023-12-08 NOTE — Progress Notes (Signed)
     Subjective: NAEON.  Still attempting to address constipation.  Foley catheter draining clear yellow urine.  Patient looks well today, reviewed case and plan  Objective: Vital signs in last 24 hours: Temp:  [98.2 F (36.8 C)-99.5 F (37.5 C)] 98.5 F (36.9 C) (08/27 0814) Pulse Rate:  [88-106] 88 (08/27 0338) Resp:  [14-21] 14 (08/27 0338) BP: (100-125)/(61-85) 125/76 (08/27 0814) SpO2:  [91 %-100 %] 100 % (08/27 0814)  Assessment/Plan: # Hematuria # Emphysematous cystitis # Morbid obesity  CBI clamped on the morning of 11/30/2023.  Urine still clear the morning of 8/20.  Discontinue CBI.  NGTD on UCx but Pseudomonas and Proteus positive on 10/22/2023 culture and sensitivity.  Would recommend at least one week of treatment for emphysematous cystitis.  Repeat CT collected.  Gas in bladder wall is clear.  Foley catheter is no longer necessary from the perspective of the emphysematous cystitis.  However, patient has a significant amount of stool burden which is pressing in on the prostate and bladder neck.  Has not responded to Mirlax.  States that he feels an urge but nothing is moving.  May require manual disimpaction.  Time, primary team has provided enema.  Will proceed with voiding trial pelvic stool burden has cleared.  External beam radiation and radioactive seed placement 12/11/2010.  Urology will follow.  Intake/Output from previous day: 08/26 0701 - 08/27 0700 In: 1320 [P.O.:1320] Out: 2450 [Urine:2450]  Intake/Output this shift: Total I/O In: -  Out: 800 [Urine:800]  Physical Exam:  General: Alert and oriented CV: No cyanosis Lungs: equal chest rise Abdomen: Soft, NTND, no rebound or guarding Gu: 6f three-way in place with CBI port plugged  Lab Results: Recent Labs    12/06/23 0552  HGB 7.9*  HCT 26.6*   BMET Recent Labs    12/06/23 0552 12/07/23 0841  NA 138 135  K 3.8 4.1  CL 103 99  CO2 28 28  GLUCOSE 91 97  BUN 41* 33*  CREATININE 0.90  1.01  CALCIUM  8.8* 8.9  HGB 7.9*  --   WBC 10.1  --      Studies/Results: No results found.      LOS: 9 days   Curtis Bourdon, NP [facility] Pager: [phone]  12/08/2023, 11:14 AM

## 2023-12-08 NOTE — Plan of Care (Signed)

## 2023-12-08 NOTE — Plan of Care (Signed)
  Problem: Education: Goal: Knowledge of General Education information will improve Description: Including pain rating scale, medication(s)/side effects and non-pharmacologic comfort measures Outcome: Progressing   Problem: Health Behavior/Discharge Planning: Goal: Ability to manage health-related needs will improve Outcome: Progressing   Problem: Clinical Measurements: Goal: Ability to maintain clinical measurements within normal limits will improve Outcome: Progressing   Problem: Coping: Goal: Level of anxiety will decrease Outcome: Progressing   Problem: Elimination: Goal: Will not experience complications related to urinary retention Outcome: Progressing   Problem: Pain Managment: Goal: General experience of comfort will improve and/or be controlled Outcome: Progressing   Problem: Safety: Goal: Ability to remain free from injury will improve Outcome: Progressing   Problem: Skin Integrity: Goal: Risk for impaired skin integrity will decrease Outcome: Progressing

## 2023-12-08 NOTE — Progress Notes (Signed)
 PROGRESS NOTE        PATIENT DETAILS Name: Curtis Hill Age: 78 y.o. Sex: male Date of Birth: 1945-09-12 Admit Date: 11/28/2023 Admitting Physician Harlene Na, DO ERE:Mndd, Carlin Redbird, MD  Brief Summary: Patient is a 78 y.o.  male with history of morbid obesity, prostate cancer-s/p TURBT 2012, OSA on CPAP, HTN, HLD-who presented from SNF for hematuria-upon further evaluation-patient was found to have septic shock secondary to hemorrhagic/emphysematous cystitis. Patient was admitted to the ICU-stabilized and subsequently transferred to [facility] on 8/20.    Significant events: 8/17>> to ED from SNF-hematuria-septic shock-hemorrhagic cystitis/emphysematous cystitis on CT imaging-unable to place Foley-urology consulted-underwent cystoscopy/urethral meatal dilatation and Foley catheter placement.  Admit to ICU for pressors. 8/20>> transferred to [facility]  Significant studies: 7/12>> echo: EF 60-65% 8/18>> CT abdomen/pelvis: Emphysematous/hemorrhagic cystitis.  Significant microbiology data: 7/11>> urine culture: Pseudomonas/Proteus. 8/18>> urine culture: No growth 8/18>> blood culture: No growth  Procedures: 8/18>> urethral dilatation-cystoscopy-Foley catheter placement by ED.  Consults: Urology PCCM Palliative care  Subjective: Patient in bed, appears comfortable, denies any headache, no fever, no chest pain or pressure, no shortness of breath , no abdominal pain. No focal weakness.  Still feels constipated.   Objective: Vitals: Blood pressure 125/76, pulse 88, temperature 98.5 F (36.9 C), temperature source Oral, resp. rate 14, height 5' 2 (1.575 m), weight (!) 175.2 kg, SpO2 96%.   Exam:  Awake Alert, No new F.N deficits, Normal affect [place].AT,PERRAL Supple Neck, No JVD,   Symmetrical Chest wall movement, Good air movement bilaterally, CTAB RRR,No Gallops, Rubs or new Murmurs,  +ve B.Sounds, Abd Soft, No tenderness, large pannus, Foley in  place No Cyanosis, Clubbing or edema    Assessment/Plan:  Septic shock secondary to hemorrhagic and emphysematous cystitis Sepsis physiology has resolved All cultures negative so far Recent urine cultures positive for Pseudomonas/Proteus-currently on IV cephalosporin x 10 days.  Hemorrhagic/emphysematous cystitis See above regarding antibiotics Hematuria has resolved Urology on board repeat CT on 12/06/2023 is significantly improved, he still has a large stool burden in his rectum per urology once his stool is cleared out then trial of Foley removal.  Placed on bowel regimen, SMO G enema on 10/31/2023 hopefully will remove Foley later today or tomorrow per urology  Constipation.  On bowel regimen enema on 12/08/2023.    AKI Hemodynamically mediated-in the setting of septic shock AKI has resolved with treatment of underlying infectious issues.  Hypomagnesemia.  Replaced   anemia Multifactorial-secondary to acute blood loss in the setting of hematuria-and acute/critical illness Hematuria has resolved-sepsis physiology improved-overall clinically improving Hb levels fluctuating-will continue to monitor Transfuse if Hb<7.  History of CAD Thoracic aortic aneurysm No anginal symptoms PCP to continue to follow/surveillance of aneurysm.  HTN All antihypertensives remain on hold-resume when BP is more elevated.  HLD Statin  History of pulmonary hypertension Supportive care As needed diuretics if develops volume overload  Bronchial asthma Not in exacerbation As needed bronchodilators.  OSA on CPAP Chronic hypoxic respiratory failure on 3 L of oxygen New CPAP/O2 Supportive care  GERD PPI  Neuropathy Neurontin   Gout No flare Allopurinol   Debility/deconditioning Mostly bedbound status for almost a month-minimally mobile before that-with the help of a walker PT/OT eval SNF planned on discharge.  Nutrition Status: Nutrition Problem: Increased nutrient  needs Etiology: wound healing Signs/Symptoms: estimated needs Interventions: Juven, Prostat, Refer to RD note for  recommendations  Class 3 Obesity Apparently on Ozempic as an outpatient Has a large pannus in his lower abdomen down to his lower thighs  Estimated body mass index is 70.65 kg/m as calculated from the following:   Height as of this encounter: 5' 2 (1.575 m).   Weight as of this encounter: 175.2 kg.   Code status:   Code Status: Full Code   DVT Prophylaxis: SCDs Start: 11/29/23 0405   Family Communication: Spouse-[phone] left VM 8/21,8/22   Disposition Plan: Status is: Inpatient Remains inpatient appropriate because: Severity of illness   Planned Discharge Destination:Skilled nursing facility   Diet: Diet Order             Diet Heart Room service appropriate? Yes with Assist; Fluid consistency: Thin  Diet effective now                     Data Review:   Inpatient Medications  Scheduled Meds:  (feeding supplement) PROSource Plus  30 mL Oral BID BM   allopurinol   300 mg Oral Daily   atorvastatin   20 mg Oral Daily   Chlorhexidine  Gluconate Cloth  6 each Topical Daily   gabapentin   100 mg Oral TID   Gerhardt's butt cream   Topical BID   leptospermum manuka honey  1 Application Topical Daily   nutrition supplement (JUVEN)  1 packet Oral BID BM   polyethylene glycol  17 g Oral Daily   Ensure Max Protein  11 oz Oral Daily   senna-docusate  1 tablet Oral BID   SMOG  400 mL Rectal Once   Continuous Infusions:   PRN Meds:.HYDROcodone -acetaminophen , metoprolol  tartrate, mouth rinse, sodium chloride , traMADol   DVT Prophylaxis  SCDs Start: 11/29/23 0405   Recent Labs  Lab 12/02/23 0508 12/03/23 0549 12/04/23 0521 12/05/23 0529 12/06/23 0552  WBC 10.6* 10.7* 9.9 9.8 10.1  HGB 7.6* 7.7* 7.7* 7.9* 7.9*  HCT 24.4* 25.2* 25.6* 27.4* 26.6*  PLT 345 358 342 328 334  MCV 88.7 90.0 88.9 93.8 90.2  MCH 27.6 27.5 26.7 27.1 26.8   MCHC 31.1 30.6 30.1 28.8* 29.7*  RDW 16.8* 16.7* 16.8* 17.2* 17.2*  LYMPHSABS  --   --   --   --  1.7  MONOABS  --   --   --   --  0.7  EOSABS  --   --   --   --  0.6*  BASOSABS  --   --   --   --  0.1    Recent Labs  Lab 12/02/23 0508 12/03/23 0549 12/05/23 0625 12/06/23 0552 12/07/23 0841  NA 141 139  --  138 135  K 3.5 3.7  --  3.8 4.1  CL 100 103  --  103 99  CO2 29 27  --  28 28  ANIONGAP 12 9  --  7 8  GLUCOSE 106* 98  --  91 97  BUN 54* 46*  --  41* 33*  CREATININE 1.21 1.07  --  0.90 1.01  INR  --   --  1.2  --   --   MG 1.7  --   --  1.2* 1.8  CALCIUM  8.5* 8.6*  --  8.8* 8.9      Recent Labs  Lab 12/02/23 0508 12/03/23 0549 12/05/23 0625 12/06/23 0552 12/07/23 0841  INR  --   --  1.2  --   --   MG 1.7  --   --  1.2* 1.8  CALCIUM  8.5* 8.6*  --  8.8* 8.9    --------------------------------------------------------------------------------------------------------------- Lab Results  Component Value Date   CHOL 122 01/13/2021   HDL 66 01/13/2021   LDLCALC 42 01/13/2021   TRIG 63 01/13/2021   CHOLHDL 1.8 11/28/2019    Lab Results  Component Value Date   HGBA1C 5.6 12/04/2020   No results for input(s): TSH, T4TOTAL, FREET4, T3FREE, THYROIDAB in the last 72 hours. No results for input(s): VITAMINB12, FOLATE, FERRITIN, TIBC, IRON, RETICCTPCT in the last 72 hours. ------------------------------------------------------------------------------------------------------------------ Cardiac Enzymes No results for input(s): CKMB, TROPONINI, MYOGLOBIN in the last 168 hours.  Invalid input(s): CK  Micro Results Recent Results (from the past 240 hours)  Blood culture (routine x 2)     Status: None   Collection Time: 11/29/23 12:30 AM   Specimen: BLOOD  Result Value Ref Range Status   Specimen Description BLOOD LEFT ANTECUBITAL  Final   Special Requests   Final    BOTTLES DRAWN AEROBIC AND ANAEROBIC Blood Culture results may  not be optimal due to an inadequate volume of blood received in culture bottles   Culture   Final    NO GROWTH 5 DAYS Performed at [facility], [address] [place], [place] [address]    Report Status 12/04/2023 FINAL  Final  Blood culture (routine x 2)     Status: None   Collection Time: 11/29/23 12:35 AM   Specimen: BLOOD  Result Value Ref Range Status   Specimen Description BLOOD LEFT UPPER ARM  Final   Special Requests   Final    BOTTLES DRAWN AEROBIC AND ANAEROBIC Blood Culture results may not be optimal due to an inadequate volume of blood received in culture bottles   Culture   Final    NO GROWTH 5 DAYS Performed at [facility], [address] [place], [place] [address]    Report Status 12/04/2023 FINAL  Final  MRSA Next Gen by PCR, Nasal     Status: None   Collection Time: 11/29/23  5:30 AM   Specimen: Urine, Catheterized; Nasal Swab  Result Value Ref Range Status   MRSA by PCR Next Gen NOT DETECTED NOT DETECTED Final    Comment: (NOTE) The GeneXpert MRSA Assay (FDA approved for NASAL specimens only), is one component of a comprehensive MRSA colonization surveillance program. It is not intended to diagnose MRSA infection nor to guide or monitor treatment for MRSA infections. Test performance is not FDA approved in patients less than 26 years old. Performed at [facility], [address] [place], [place] [address]   Urine Culture     Status: None   Collection Time: 11/29/23  5:38 AM   Specimen: Urine, Random  Result Value Ref Range Status   Specimen Description URINE, RANDOM  Final   Special Requests NONE Reflexed from [id number]  Final   Culture   Final    NO GROWTH Performed at [facility], [address], [place], [place] [address]    Report Status 11/30/2023 FINAL  Final    Radiology Reports  CT ABDOMEN PELVIS WO CONTRAST Result Date: 12/06/2023 CLINICAL DATA:  Gross hematuria EXAM: CT ABDOMEN AND PELVIS WITHOUT CONTRAST  TECHNIQUE: Multidetector CT imaging of the abdomen and pelvis was performed following the standard protocol without IV contrast. RADIATION DOSE REDUCTION: This exam was performed according to the departmental dose-optimization program which includes automated exposure control, adjustment of the mA and/or kV according to patient size and/or use  of iterative reconstruction technique. COMPARISON:  11/29/2023 FINDINGS: Lower chest: Moderate cardiomegaly. Aortic valve calcification. Thoracic aortic atherosclerosis. Scarring or atelectasis in the right middle lobe with asymmetric airway thickening and some airway plugging in the right lower lobe. Hepatobiliary: Stable lobulated hepatic morphology with prominent caudate lobe and atrophic lateral segment left hepatic lobe, appearance favors and degenerative disc disease with levoconvex lumbar scoliosis. Cirrhosis. Pancreas: Unremarkable Spleen: Unremarkable Adrenals/Urinary Tract: Bilateral renal cysts including a Bosniak category 2 cyst of the right kidney on image 39 of series 3 which was previously fluid density on prior exams and accordingly considered benign. Tiny peripheral Bosniak category 2 cyst of the left kidney lower pole anteriorly with internal density of 90 Hounsfield units on image 53 series 5, benign. No further imaging workup of these lesions is indicated. Adrenal glands unremarkable. Empty urinary bladder with Foley catheter in place. Mild stranding of the urinary bladder wall could reflect cystitis but the previous gas in the urinary bladder wall has completely resolved. No significant hydronephrosis or hydroureter. 3 mm right mid upper kidney nonobstructive renal calculus, image 98 series 6. Stomach/Bowel: Sigmoid colon diverticulosis. Vascular/Lymphatic: Atherosclerosis is present, including aortoiliac atherosclerotic disease. Reproductive: Brachytherapy seeds in the prostate gland. Poor characterization of the penis and scrotal sac partially related to  posterior positioning and possible hydroceles, recommend correlation with visual inspection. Possible inflammatory stranding in the fatty tissues of the perineum/pubis which extend down below the inferior margin of imaging, visual inspection of the perineum recommended. Other: No supplemental non-categorized findings. Musculoskeletal: Lax abdominal wall musculature. Prominent overhanging pannus including the anterior abdominal wall, pubis, and perineum. Lower lumbar spondylosis IMPRESSION: 1. Mild stranding of the urinary bladder wall could reflect cystitis but the previous gas in the urinary bladder wall has completely resolved. Foley catheter in place. 2. 3 mm right mid upper kidney nonobstructive renal calculus. 3. Poor characterization of the penis and scrotal sac partially related to posterior positioning and possible hydroceles, recommend correlation with visual inspection. 4. Possible inflammatory stranding in the fatty tissues of the perineum/pubis which extend down below the inferior margin of imaging, visual inspection of the perineum recommended. 5. Scarring or atelectasis in the right middle lobe with asymmetric airway thickening and some airway plugging in the right lower lobe. 6. Sigmoid colon diverticulosis. 7. Brachytherapy seeds in the prostate gland. 8. Lax abdominal wall musculature with prominent overhanging pannus including the anterior abdominal wall, pubis, and perineum. 9.  Aortic Atherosclerosis (ICD10-I70.0). Electronically Signed   By: Ryan Salvage M.D.   On: 12/06/2023 12:10      Signature  -   Lavada Stank M.D on 12/08/2023 at 8:51 AM   -  To page go to [email or web address]

## 2023-12-08 NOTE — Progress Notes (Signed)
 Nutrition Follow-up  DOCUMENTATION CODES:  Morbid obesity  INTERVENTION:  Encourage adequate oral intake Meal ordering with assistance Multivitamin w/ minerals daily Juven BID to support wound healing 30 ml ProSource Plus BID, each supplement provides 100 kcals and 15 grams protein.  Ensure Max po at bedtime, each supplement provides 150 kcal and 30 grams of protein.   NUTRITION DIAGNOSIS:  Increased nutrient needs related to wound healing as evidenced by estimated needs - Ongoing   GOAL:  Patient will meet greater than or equal to 90% of their needs - Ongoing  MONITOR:  PO intake, Supplement acceptance, Labs, Weight trends, Skin  REASON FOR ASSESSMENT:  Consult Assessment of nutrition requirement/status  ASSESSMENT:  Pt admitted with hematuria. PMH significant for thoracic aortic ascending aneurysm, CAD, pulmonary HTN, HLD, iron deficiency anemia, recently admitted 7/11-7/14 for AMS secondary to sepsis d/t pseudomonal UTI and 8/24-6/2 for sepsis d/t panniculitis  8/18: CT renal stone study- mild bilateral hydroureter, nonobstructing right nephrolithiasis, concern for bladder hemorrhage 8/20: transferred to floor  RD working remotely at time of follow-up. Discussed with RN. RN reports that pt has primarily been taking in soft liquids, but recently had a large bowel movement and suspect PO intake to improve. RN reports that pt has been taking the oral nutrition supplements.   Per MD note, pt to have foley trial once stool burden cleared.   Meal Intake  8/19: 50% breakfast 8/25: 75% breakfast, 75% lunch, 100% dinner 8/26: 100% breakfast, 75% lunch  Admit Weight: 196.5 kg - suspect was copied forward from previous encounter Current Weight: 175.2 kg (8/26)  Nutrition Related Medications: Miralax , Senokot-S Labs: reviewed   UOP: 2450 mL x 24 hrs   Diet Order:   Diet Order             Diet Heart Room service appropriate? Yes with Assist; Fluid consistency: Thin  Diet  effective now                  EDUCATION NEEDS:  Not appropriate for education at this time  Skin:  Skin Assessment: Skin Integrity Issues: Skin Integrity Issues:: Stage II Stage II: Per WOC (8/19) 1. Full thickness wound R pannus/abdominal fold 2. Stage 3 Pressure Injury Sacrum 3. Intertriginous dermatitis pannus  Last BM:  8/27 per RN  Height:  Ht Readings from Last 1 Encounters:  12/06/23 5' 2 (1.575 m)   Weight:  Wt Readings from Last 1 Encounters:  12/07/23 (!) 175.2 kg   Ideal Body Weight:  67.3 kg  BMI:  Body mass index is 70.65 kg/m.  Estimated Nutritional Needs:  Kcal:  1800-2000 Protein:  125-140g Fluid:  >/=1.8L   Nestora Glatter RD, LDN Clinical Dietitian

## 2023-12-08 NOTE — TOC Progression Note (Signed)
 Transition of Care [facility]) - Progression Note    Patient Details  Name: Curtis Hill MRN: [id number] Date of Birth: Aug 11, 1945  [facility] CM/SW Contact  Inocente GORMAN Kindle, LCSW Phone Number: 12/08/2023, 8:42 AM  Clinical Narrative:    CSW updated [facility] on potential discharge tomorrow per MD.    Expected Discharge Plan: Skilled Nursing Facility Barriers to Discharge: Continued Medical Work up               Expected Discharge Plan and Services                                               Social Drivers of Health (SDOH) Interventions SDOH Screenings   Food Insecurity: No Food Insecurity (11/30/2023)  Housing: Low Risk  (11/30/2023)  Transportation Needs: Unmet Transportation Needs (11/30/2023)  Utilities: Not At Risk (11/30/2023)  Depression (PHQ2-9): Medium Risk (05/23/2020)  Social Connections: Moderately Integrated (11/30/2023)  Tobacco Use: Low Risk  (11/28/2023)    Readmission Risk Interventions     No data to display

## 2023-12-09 DIAGNOSIS — R310 Gross hematuria: Secondary | ICD-10-CM | POA: Diagnosis not present

## 2023-12-09 MED ORDER — METOPROLOL TARTRATE 50 MG PO TABS
50.0000 mg | ORAL_TABLET | Freq: Two times a day (BID) | ORAL | Status: DC
  Administered 2023-12-09: 50 mg via ORAL
  Filled 2023-12-09: qty 1

## 2023-12-09 MED ORDER — METOPROLOL TARTRATE 25 MG PO TABS: 12.5000 mg | ORAL_TABLET | Freq: Two times a day (BID) | ORAL | Status: DC | PRN

## 2023-12-09 MED ORDER — POLYETHYLENE GLYCOL 3350 17 G PO PACK: 17.0000 g | PACK | Freq: Every day | ORAL | Status: DC

## 2023-12-09 MED ORDER — MAGNESIUM SULFATE 2 GM/50ML IV SOLN
2.0000 g | Freq: Once | INTRAVENOUS | Status: AC
  Administered 2023-12-09: 2 g via INTRAVENOUS
  Filled 2023-12-09: qty 50

## 2023-12-09 MED ORDER — TRAMADOL HCL 50 MG PO TABS: 50.0000 mg | ORAL_TABLET | Freq: Four times a day (QID) | ORAL | 0 refills | Status: AC | PRN

## 2023-12-09 MED ORDER — METOPROLOL TARTRATE 50 MG PO TABS: 50.0000 mg | ORAL_TABLET | Freq: Two times a day (BID) | ORAL | Status: DC

## 2023-12-09 MED ORDER — METOPROLOL TARTRATE 25 MG PO TABS: 25.0000 mg | ORAL_TABLET | Freq: Two times a day (BID) | ORAL | Status: DC | PRN

## 2023-12-09 MED ORDER — SENNOSIDES-DOCUSATE SODIUM 8.6-50 MG PO TABS
1.0000 | ORAL_TABLET | Freq: Every evening | ORAL | Status: AC | PRN
Start: 2023-12-09 — End: ?

## 2023-12-09 MED ORDER — METOPROLOL TARTRATE 25 MG PO TABS: 25.0000 mg | ORAL_TABLET | Freq: Two times a day (BID) | ORAL | Status: DC

## 2023-12-09 MED ORDER — METOPROLOL TARTRATE 25 MG PO TABS
25.0000 mg | ORAL_TABLET | Freq: Two times a day (BID) | ORAL | Status: DC
  Administered 2023-12-09 – 2023-12-10 (×2): 25 mg via ORAL
  Filled 2023-12-09 (×2): qty 1

## 2023-12-09 MED ORDER — METOPROLOL TARTRATE 25 MG PO TABS
25.0000 mg | ORAL_TABLET | Freq: Two times a day (BID) | ORAL | Status: DC
  Filled 2023-12-09: qty 1

## 2023-12-09 NOTE — Plan of Care (Signed)
  Problem: Health Behavior/Discharge Planning: Goal: Ability to manage health-related needs will improve Outcome: Progressing   Problem: Clinical Measurements: Goal: Will remain free from infection Outcome: Progressing   Problem: Nutrition: Goal: Adequate nutrition will be maintained Outcome: Progressing   Problem: Pain Managment: Goal: General experience of comfort will improve and/or be controlled Outcome: Progressing   Problem: Skin Integrity: Goal: Risk for impaired skin integrity will decrease Outcome: Progressing

## 2023-12-09 NOTE — Discharge Instructions (Signed)
 Follow with Primary MD Okey Carlin Redbird, MD in 7 days   Get CBC, CMP, Magnesium , 2 view Chest X ray -  checked next visit with your primary MD or SNF MD   Activity: As tolerated with Full fall precautions use walker/cane & assistance as needed  Disposition SNF  Diet: Heart Healthy  Special Instructions: If you have smoked or chewed Tobacco  in the last 2 yrs please stop smoking, stop any regular Alcohol  and or any Recreational drug use.  On your next visit with your primary care physician please Get Medicines reviewed and adjusted.  Please request your Prim.MD to go over all Hospital Tests and Procedure/Radiological results at the follow up, please get all Hospital records sent to your Prim MD by signing hospital release before you go home.  If you experience worsening of your admission symptoms, develop shortness of breath, life threatening emergency, suicidal or homicidal thoughts you must seek medical attention immediately by calling 911 or calling your MD immediately  if symptoms less severe.  You Must read complete instructions/literature along with all the possible adverse reactions/side effects for all the Medicines you take and that have been prescribed to you. Take any new Medicines after you have completely understood and accpet all the possible adverse reactions/side effects.   Do not drive when taking Pain medications.  Do not take more than prescribed Pain, Sleep and Anxiety Medications  Wear Seat belts while driving.

## 2023-12-09 NOTE — Progress Notes (Signed)
 PT Cancellation Note  Patient Details Name: Curtis Hill MRN: [id number] DOB: 23-Jul-1945   Cancelled Treatment:    Reason Eval/Treat Not Completed: Other (comment); pt reports plans for d/c to rehab today, also he states supposed to rest due to HR high this am.  Will defer further therapy to rehab setting.    Montie Portal 12/09/2023, 11:37 AM Micheline Portal, PT Acute Rehabilitation Services Office:[phone] 12/09/2023

## 2023-12-09 NOTE — Discharge Summary (Addendum)
 Curtis Hill FMW:[id number] DOB: [birth year] DOA: 11/28/2023  PCP: Okey Carlin Redbird, MD  Admit date: 11/28/2023  Discharge date: 12/10/2023  Admitted From: Home   Disposition:  SNF   Recommendations for Outpatient Follow-up:   Follow up with PCP in 1-2 weeks  PCP Please obtain BMP/CBC, 2 view CXR in 1week,  (see Discharge instructions)   PCP Please follow up on the following pending results:     Home Health: None Equipment/Devices: None Consultations:  urology, PCCM Discharge Condition: Stable    CODE STATUS: Full    Diet Recommendation: Heart Healthy     Chief Complaint  Patient presents with   Dysuria     Brief history of present illness from the day of admission and additional interim summary    78 y.o.  male with history of morbid obesity, prostate cancer-s/p TURBT 2012, OSA on CPAP, HTN, HLD-who presented from SNF for hematuria-upon further evaluation-patient was found to have septic shock secondary to hemorrhagic/emphysematous cystitis. Patient was admitted to the ICU-stabilized and subsequently transferred to [facility] on 8/20.     Significant events: 8/17>> to ED from SNF-hematuria-septic shock-hemorrhagic cystitis/emphysematous cystitis on CT imaging-unable to place Foley-urology consulted-underwent cystoscopy/urethral meatal dilatation and Foley catheter placement.  Admit to ICU for pressors. 8/20>> transferred to [facility]   Significant studies: 7/12>> echo: EF 60-65% 8/18>> CT abdomen/pelvis: Emphysematous/hemorrhagic cystitis.   Significant microbiology data: 7/11>> urine culture: Pseudomonas/Proteus. 8/18>> urine culture: No growth 8/18>> blood culture: No growth   Procedures: 8/18>> urethral dilatation-cystoscopy-Foley catheter placement by ED.                                                                  Hospital Course   Septic shock secondary to hemorrhagic and emphysematous cystitis Sepsis physiology has resolved All cultures negative so far Recent urine cultures positive for Pseudomonas/Proteus-he has finished IV cephalosporin x 10 days.   Hemorrhagic/emphysematous cystitis See above regarding antibiotics Hematuria has resolved Urology on board repeat CT on 12/06/2023 is significantly improved, catheter was removed on 12/09/2023, stable postvoid residuals of under 50 cc, seen by urology again morning of 12/10/2023 and cleared for discharge.  Continue to follow postvoid residuals once a day for the next 2 to 3 days at [facility], postdischarge follow-up with urology outpatient.   Constipation.  This has resolved after aggressive bowel regimen.  Monitor at SNF.   AKI Hemodynamically mediated-in the setting of septic shock AKI has resolved with treatment of underlying infectious issues.   Hypomagnesemia.  Replaced    anemia Multifactorial-secondary to acute blood loss in the setting of hematuria-and acute/critical illness Hematuria has resolved-sepsis physiology improved-overall clinically improving Hb levels fluctuating-will continue to monitor Transfuse if Hb<7.  PCP to monitor outpatient and do age-appropriate anemia workup   History of CAD Thoracic aortic aneurysm No anginal  symptoms PCP to continue to follow/surveillance of aneurysm.  Low-dose beta-blocker.   HTN Stable on home regimen   HLD Statin   History of pulmonary hypertension Supportive care Continue home regimen   Bronchial asthma Not in exacerbation As needed bronchodilators.   OSA on CPAP Chronic hypoxic respiratory failure on 3 L of oxygen New CPAP/O2 continue nightly, daytime 3 L nasal cannula oxygen Supportive care   GERD PPI   Neuropathy Neurontin    Gout No flare Allopurinol    Debility/deconditioning Mostly bedbound status for almost a month-minimally mobile before that-with the  help of a walker PT/OT eval SNF planned on discharge.     Class 3 Obesity, 70, follow-up with PCP continue home medications.  Discharge diagnosis     Principal Problem:   Hematuria Active Problems:   Hemorrhagic cystitis    Discharge instructions    Discharge Instructions     Discharge instructions   Complete by: As directed    Follow with Primary MD Okey Carlin Redbird, MD in 7 days   Get CBC, CMP, Magnesium , 2 view Chest X ray -  checked next visit with your primary MD or SNF MD   Activity: As tolerated with Full fall precautions use walker/cane & assistance as needed  Disposition SNF  Diet: Heart Healthy  Special Instructions: If you have smoked or chewed Tobacco  in the last 2 yrs please stop smoking, stop any regular Alcohol  and or any Recreational drug use.  On your next visit with your primary care physician please Get Medicines reviewed and adjusted.  Please request your Prim.MD to go over all Hospital Tests and Procedure/Radiological results at the follow up, please get all Hospital records sent to your Prim MD by signing hospital release before you go home.  If you experience worsening of your admission symptoms, develop shortness of breath, life threatening emergency, suicidal or homicidal thoughts you must seek medical attention immediately by calling 911 or calling your MD immediately  if symptoms less severe.  You Must read complete instructions/literature along with all the possible adverse reactions/side effects for all the Medicines you take and that have been prescribed to you. Take any new Medicines after you have completely understood and accpet all the possible adverse reactions/side effects.   Do not drive when taking Pain medications.  Do not take more than prescribed Pain, Sleep and Anxiety Medications  Wear Seat belts while driving.   Discharge wound care:   Complete by: As directed    Cleanse sacral wound with NS, apply Medihoney to wound bed  daily, cover with dry gauze and secure with silicone foam.   2.Cleanse full thickness wound R abdominal fold with NS, apply silver hydrofiber (Aquacel AG Gerlean [phone]) to wound bed daily and as needed to contain drainage, cover with ABD pad.      Wound care  Every shift      Comments: Cleanse underneath pannus with soap and water, dry and apply Order Gerlean # [id number] as follows:  Measure and cut length of InterDry to fit in skin folds that have skin breakdown Tuck InterDry fabric into skin folds in a single layer, allow for 2 inches of overhang from skin edges to allow for wicking to occur May remove to bathe; dry area thoroughly and then tuck into affected areas again Do not apply any creams or ointments when using InterDry DO NOT THROW AWAY FOR 5 DAYS unless soiled with stool DO NOT [facility] product, this will inactivate the silver in the material  New sheet of Interdry should be applied after 5 days of use if patient continues to have skin breakdown   Increase activity slowly   Complete by: As directed        Discharge Medications   Allergies as of 12/10/2023       Reactions   Levofloxacin  Other (See Comments)   Aortic aneurysm   Lisinopril Anaphylaxis, Shortness Of Breath, Swelling, Rash   Prednisone  Swelling        Medication List     STOP taking these medications    collagenase  250 UNIT/GM ointment Commonly known as: SANTYL    Magnesium  200 MG Tabs       TAKE these medications    albuterol  108 (90 Base) MCG/ACT inhaler Commonly known as: VENTOLIN  HFA Inhale 2 puffs into the lungs every 6 (six) hours as needed for wheezing or shortness of breath.   allopurinol  300 MG tablet Commonly known as: ZYLOPRIM  Take 300 mg by mouth daily.   ammonium lactate 12 % lotion Commonly known as: LAC-HYDRIN Apply 1 Application topically 2 (two) times daily as needed for dry skin. Apply to bilateral legs and feet two times a day for dry, irritated skin. Rub in well.   ascorbic  acid 500 MG tablet Commonly known as: VITAMIN C Take 500 mg by mouth 2 (two) times daily.   aspirin  EC 81 MG tablet Take 81 mg by mouth daily. Swallow whole.   atorvastatin  20 MG tablet Commonly known as: LIPITOR Take 20 mg by mouth every evening.   calcium  carbonate 500 MG chewable tablet Commonly known as: TUMS - dosed in mg elemental calcium  Chew 1 tablet by mouth 2 (two) times daily. Give for calcium  supplementation   Centrum Silver 50+Men Tabs Take 1 tablet by mouth in the morning.   cholecalciferol 25 MCG (1000 UNIT) tablet Commonly known as: VITAMIN D3 Take 1,000 Units by mouth daily.   CoQ10 200 MG Caps Take 1 capsule by mouth in the morning.   cyanocobalamin 500 MCG tablet Commonly known as: VITAMIN B12 Take 1,000 mcg by mouth daily.   feeding supplement (PRO-STAT SUGAR FREE 64) Liqd Take 30 mLs by mouth in the morning and at bedtime.   furosemide  40 MG tablet Commonly known as: LASIX  Take 40 mg by mouth daily as needed for edema or fluid.   gabapentin  100 MG capsule Commonly known as: NEURONTIN  Take 100 mg by mouth 3 (three) times daily. Give one capsule three times daily for nerve pain .   Gerhardt's butt cream Crea Apply 1 Application topically 3 (three) times daily.   magnesium  oxide 400 (240 Mg) MG tablet Commonly known as: MAG-OX Take 400 mg by mouth daily.   metoprolol  tartrate 25 MG tablet Commonly known as: LOPRESSOR  Take 1 tablet (25 mg total) by mouth 2 (two) times daily.   Nasal Moist Gel Place 1 Units into both nostrils daily. Give for dryness   nystatin  powder Commonly known as: MYCOSTATIN /NYSTOP  Apply topically 3 (three) times daily. What changed:  how much to take additional instructions   polyethylene glycol 17 g packet Commonly known as: MIRALAX  / GLYCOLAX  Take 17 g by mouth daily.   semaglutide-weight management 0.25 MG/0.5ML Soaj SQ injection Commonly known as: WEGOVY Inject 0.25 mg into the skin once a week. Inject every  Wednesday.   senna-docusate 8.6-50 MG tablet Commonly known as: Senokot-S Take 1 tablet by mouth at bedtime as needed for mild constipation.   simethicone  80 MG chewable tablet Commonly known as: MYLICON Chew  160 mg by mouth 3 (three) times daily after meals. Give for GI distress   traMADol  50 MG tablet Commonly known as: ULTRAM  Take 1 tablet (50 mg total) by mouth every 6 (six) hours as needed for moderate pain (pain score 4-6) or severe pain (pain score 7-10).               Discharge Care Instructions  (From admission, onward)           Start     Ordered   12/09/23 0000  Discharge wound care:       Comments: Cleanse sacral wound with NS, apply Medihoney to wound bed daily, cover with dry gauze and secure with silicone foam.   2.Cleanse full thickness wound R abdominal fold with NS, apply silver hydrofiber (Aquacel AG Gerlean [phone]) to wound bed daily and as needed to contain drainage, cover with ABD pad.      Wound care  Every shift      Comments: Cleanse underneath pannus with soap and water, dry and apply Order Gerlean # [id number] as follows:  Measure and cut length of InterDry to fit in skin folds that have skin breakdown Tuck InterDry fabric into skin folds in a single layer, allow for 2 inches of overhang from skin edges to allow for wicking to occur May remove to bathe; dry area thoroughly and then tuck into affected areas again Do not apply any creams or ointments when using InterDry DO NOT THROW AWAY FOR 5 DAYS unless soiled with stool DO NOT [facility] product, this will inactivate the silver in the material  New sheet of Interdry should be applied after 5 days of use if patient continues to have skin breakdown   12/09/23 1004             Contact information for follow-up providers     Okey Carlin Redbird, MD. Schedule an appointment as soon as possible for a visit in 1 week(s).   Specialty: Family Medicine Contact information: [address] [place]  [place] [address] [phone]         Elisabeth Valli BIRCH, MD. Schedule an appointment as soon as possible for a visit in 1 week(s).   Specialty: Urology Contact information: [address] [place] [address] [place] [address] [phone]              Contact information for after-discharge care     Destination     [facility] .   Service: Skilled Nursing Contact information: [address] [place]  [address] [phone]                     Major procedures and Radiology Reports - PLEASE review detailed and final reports thoroughly  -        CT ABDOMEN PELVIS WO CONTRAST Result Date: 12/06/2023 CLINICAL DATA:  Gross hematuria EXAM: CT ABDOMEN AND PELVIS WITHOUT CONTRAST TECHNIQUE: Multidetector CT imaging of the abdomen and pelvis was performed following the standard protocol without IV contrast. RADIATION DOSE REDUCTION: This exam was performed according to the departmental dose-optimization program which includes automated exposure control, adjustment of the mA and/or kV according to patient size and/or use of iterative reconstruction technique. COMPARISON:  11/29/2023 FINDINGS: Lower chest: Moderate cardiomegaly. Aortic valve calcification. Thoracic aortic atherosclerosis. Scarring or atelectasis in the right middle lobe with asymmetric airway thickening and some airway plugging in the right lower lobe. Hepatobiliary: Stable lobulated hepatic morphology with prominent caudate lobe and atrophic lateral  segment left hepatic lobe, appearance favors and degenerative disc disease with levoconvex lumbar scoliosis. Cirrhosis. Pancreas: Unremarkable Spleen: Unremarkable Adrenals/Urinary Tract: Bilateral renal cysts including a Bosniak category 2 cyst of the right kidney on image 39 of series 3 which was previously fluid density on prior exams and accordingly considered benign. Tiny peripheral Bosniak category 2 cyst of the left kidney lower pole anteriorly with  internal density of 90 Hounsfield units on image 53 series 5, benign. No further imaging workup of these lesions is indicated. Adrenal glands unremarkable. Empty urinary bladder with Foley catheter in place. Mild stranding of the urinary bladder wall could reflect cystitis but the previous gas in the urinary bladder wall has completely resolved. No significant hydronephrosis or hydroureter. 3 mm right mid upper kidney nonobstructive renal calculus, image 98 series 6. Stomach/Bowel: Sigmoid colon diverticulosis. Vascular/Lymphatic: Atherosclerosis is present, including aortoiliac atherosclerotic disease. Reproductive: Brachytherapy seeds in the prostate gland. Poor characterization of the penis and scrotal sac partially related to posterior positioning and possible hydroceles, recommend correlation with visual inspection. Possible inflammatory stranding in the fatty tissues of the perineum/pubis which extend down below the inferior margin of imaging, visual inspection of the perineum recommended. Other: No supplemental non-categorized findings. Musculoskeletal: Lax abdominal wall musculature. Prominent overhanging pannus including the anterior abdominal wall, pubis, and perineum. Lower lumbar spondylosis IMPRESSION: 1. Mild stranding of the urinary bladder wall could reflect cystitis but the previous gas in the urinary bladder wall has completely resolved. Foley catheter in place. 2. 3 mm right mid upper kidney nonobstructive renal calculus. 3. Poor characterization of the penis and scrotal sac partially related to posterior positioning and possible hydroceles, recommend correlation with visual inspection. 4. Possible inflammatory stranding in the fatty tissues of the perineum/pubis which extend down below the inferior margin of imaging, visual inspection of the perineum recommended. 5. Scarring or atelectasis in the right middle lobe with asymmetric airway thickening and some airway plugging in the right lower lobe.  6. Sigmoid colon diverticulosis. 7. Brachytherapy seeds in the prostate gland. 8. Lax abdominal wall musculature with prominent overhanging pannus including the anterior abdominal wall, pubis, and perineum. 9.  Aortic Atherosclerosis (ICD10-I70.0). Electronically Signed   By: Ryan Salvage M.D.   On: 12/06/2023 12:10   CT Renal Stone Study Result Date: 11/29/2023 CLINICAL DATA:  Abdominal/flank pain, stone suspected EXAM: CT ABDOMEN AND PELVIS WITHOUT CONTRAST TECHNIQUE: Multidetector CT imaging of the abdomen and pelvis was performed following the standard protocol without IV contrast. RADIATION DOSE REDUCTION: This exam was performed according to the departmental dose-optimization program which includes automated exposure control, adjustment of the mA and/or kV according to patient size and/or use of iterative reconstruction technique. COMPARISON:  10/22/2023 FINDINGS: Lower chest: Bronchial wall thickening with centrilobular clustered micro nodules in the right lower lobe compatible with small airway infection/inflammation. Hepatobiliary: Nodular hepatic contour compatible with cirrhosis. Unremarkable gallbladder and biliary tree. Pancreas: Unremarkable. Spleen: Unremarkable. Adrenals/Urinary Tract: Normal adrenal glands. Stable cyst in the posterior right kidney which demonstrates intermediate density. However on 10/22/2023 this was low-density. Findings are compatible with hemorrhagic or proteinaceous transformation of a simple cyst. No follow-up recommended. Mild bilateral hydroureter is new compared to 10/22/2023. Nonobstructing right nephrolithiasis. Extensive gas within the bladder lumen and within the bladder wall. Foley catheter balloon at the bladder neck. Hyperdense debris in the posterior bladder suspicious for hemorrhage. Stomach/Bowel: Stomach is within normal limits. No bowel obstruction or bowel wall thickening. Vascular/Lymphatic: Aortic atherosclerosis. No enlarged abdominal or pelvic  lymph nodes. Reproductive: Prostate  radiotherapy seeds. Other: Perivesical stranding. Trace free fluid in the pelvis. No free intraperitoneal air. Musculoskeletal: No acute fracture. IMPRESSION: Findings concerning for emphysematous/hemorrhagic cystitis. Electronically Signed   By: Norman Gatlin M.D.   On: 11/29/2023 02:03    Micro Results    No results found for this or any previous visit (from the past 240 hours).  Today   Subjective    Curtis Hill today has no headache,no chest abdominal pain,no new weakness tingling or numbness, feels much better wants to go home today.    Objective   Blood pressure  130/80, pulse 90, temperature 97.7 F (36.5 C), temperature source Oral, resp. rate (!) 25, height 5' 2 (1.575 m), weight (!) 175.4 kg, SpO2 98%.   Intake/Output Summary (Last 24 hours) at 12/10/2023 0842 Last data filed at 12/09/2023 2102 Gross per 24 hour  Intake 1080 ml  Output --  Net 1080 ml    Exam  Awake Alert, No new F.N deficits,    [place].AT,PERRAL Supple Neck,   Symmetrical Chest wall movement, Good air movement bilaterally, CTAB RRR,No Gallops,   +ve B.Sounds, Abd Soft, Non tender,  No Cyanosis, Clubbing or edema    Data Review   Recent Labs  Lab 12/04/23 0521 12/05/23 0529 12/06/23 0552  WBC 9.9 9.8 10.1  HGB 7.7* 7.9* 7.9*  HCT 25.6* 27.4* 26.6*  PLT 342 328 334  MCV 88.9 93.8 90.2  MCH 26.7 27.1 26.8  MCHC 30.1 28.8* 29.7*  RDW 16.8* 17.2* 17.2*  LYMPHSABS  --   --  1.7  MONOABS  --   --  0.7  EOSABS  --   --  0.6*  BASOSABS  --   --  0.1    Recent Labs  Lab 12/05/23 0625 12/06/23 0552 12/07/23 0841 12/10/23 0434  NA  --  138 135 134*  K  --  3.8 4.1 4.3  CL  --  103 99 98  CO2  --  28 28 27   ANIONGAP  --  7 8 9   GLUCOSE  --  91 97 94  BUN  --  41* 33* 34*  CREATININE  --  0.90 1.01 1.02  INR 1.2  --   --   --   MG  --  1.2* 1.8 1.8  PHOS  --   --   --  3.5  CALCIUM   --  8.8* 8.9 9.0    Total Time in preparing paper work,  data evaluation and todays exam - 35 minutes  Signature  -    Lavada Stank M.D on 12/10/2023 at 8:42 AM   -  To page go to [email or web address]

## 2023-12-09 NOTE — Plan of Care (Signed)

## 2023-12-10 DIAGNOSIS — N390 Urinary tract infection, site not specified: Secondary | ICD-10-CM | POA: Diagnosis not present

## 2023-12-10 DIAGNOSIS — I7121 Aneurysm of the ascending aorta, without rupture: Secondary | ICD-10-CM | POA: Diagnosis not present

## 2023-12-10 DIAGNOSIS — M314 Aortic arch syndrome [Takayasu]: Secondary | ICD-10-CM | POA: Diagnosis not present

## 2023-12-10 DIAGNOSIS — I350 Nonrheumatic aortic (valve) stenosis: Secondary | ICD-10-CM | POA: Diagnosis not present

## 2023-12-10 DIAGNOSIS — D500 Iron deficiency anemia secondary to blood loss (chronic): Secondary | ICD-10-CM | POA: Diagnosis not present

## 2023-12-10 DIAGNOSIS — M109 Gout, unspecified: Secondary | ICD-10-CM | POA: Diagnosis not present

## 2023-12-10 DIAGNOSIS — E559 Vitamin D deficiency, unspecified: Secondary | ICD-10-CM | POA: Diagnosis not present

## 2023-12-10 DIAGNOSIS — M793 Panniculitis, unspecified: Secondary | ICD-10-CM | POA: Diagnosis not present

## 2023-12-10 DIAGNOSIS — E785 Hyperlipidemia, unspecified: Secondary | ICD-10-CM | POA: Diagnosis not present

## 2023-12-10 DIAGNOSIS — G4733 Obstructive sleep apnea (adult) (pediatric): Secondary | ICD-10-CM | POA: Diagnosis not present

## 2023-12-10 DIAGNOSIS — M6259 Muscle wasting and atrophy, not elsewhere classified, multiple sites: Secondary | ICD-10-CM | POA: Diagnosis not present

## 2023-12-10 DIAGNOSIS — R310 Gross hematuria: Secondary | ICD-10-CM | POA: Diagnosis not present

## 2023-12-10 DIAGNOSIS — R739 Hyperglycemia, unspecified: Secondary | ICD-10-CM | POA: Diagnosis not present

## 2023-12-10 DIAGNOSIS — R6510 Systemic inflammatory response syndrome (SIRS) of non-infectious origin without acute organ dysfunction: Secondary | ICD-10-CM | POA: Diagnosis not present

## 2023-12-10 DIAGNOSIS — E66811 Obesity, class 1: Secondary | ICD-10-CM | POA: Diagnosis not present

## 2023-12-10 DIAGNOSIS — R5381 Other malaise: Secondary | ICD-10-CM | POA: Diagnosis not present

## 2023-12-10 DIAGNOSIS — E65 Localized adiposity: Secondary | ICD-10-CM | POA: Diagnosis not present

## 2023-12-10 DIAGNOSIS — I272 Pulmonary hypertension, unspecified: Secondary | ICD-10-CM | POA: Diagnosis not present

## 2023-12-10 DIAGNOSIS — N3091 Cystitis, unspecified with hematuria: Secondary | ICD-10-CM | POA: Diagnosis not present

## 2023-12-10 DIAGNOSIS — K449 Diaphragmatic hernia without obstruction or gangrene: Secondary | ICD-10-CM | POA: Diagnosis not present

## 2023-12-10 DIAGNOSIS — D509 Iron deficiency anemia, unspecified: Secondary | ICD-10-CM | POA: Diagnosis not present

## 2023-12-10 DIAGNOSIS — Z860100 Personal history of colon polyps, unspecified: Secondary | ICD-10-CM | POA: Diagnosis not present

## 2023-12-10 DIAGNOSIS — M1990 Unspecified osteoarthritis, unspecified site: Secondary | ICD-10-CM | POA: Diagnosis not present

## 2023-12-10 DIAGNOSIS — R29818 Other symptoms and signs involving the nervous system: Secondary | ICD-10-CM | POA: Diagnosis not present

## 2023-12-10 DIAGNOSIS — F4323 Adjustment disorder with mixed anxiety and depressed mood: Secondary | ICD-10-CM | POA: Diagnosis not present

## 2023-12-10 DIAGNOSIS — I10 Essential (primary) hypertension: Secondary | ICD-10-CM | POA: Diagnosis not present

## 2023-12-10 DIAGNOSIS — E538 Deficiency of other specified B group vitamins: Secondary | ICD-10-CM | POA: Diagnosis not present

## 2023-12-10 DIAGNOSIS — R531 Weakness: Secondary | ICD-10-CM | POA: Diagnosis not present

## 2023-12-10 DIAGNOSIS — I2510 Atherosclerotic heart disease of native coronary artery without angina pectoris: Secondary | ICD-10-CM | POA: Diagnosis not present

## 2023-12-10 DIAGNOSIS — J9611 Chronic respiratory failure with hypoxia: Secondary | ICD-10-CM | POA: Diagnosis not present

## 2023-12-10 DIAGNOSIS — R5383 Other fatigue: Secondary | ICD-10-CM | POA: Diagnosis not present

## 2023-12-10 DIAGNOSIS — K5900 Constipation, unspecified: Secondary | ICD-10-CM | POA: Diagnosis not present

## 2023-12-10 DIAGNOSIS — G629 Polyneuropathy, unspecified: Secondary | ICD-10-CM | POA: Diagnosis not present

## 2023-12-10 DIAGNOSIS — A419 Sepsis, unspecified organism: Secondary | ICD-10-CM | POA: Diagnosis not present

## 2023-12-10 DIAGNOSIS — Z6841 Body Mass Index (BMI) 40.0 and over, adult: Secondary | ICD-10-CM | POA: Diagnosis not present

## 2023-12-10 DIAGNOSIS — Z7401 Bed confinement status: Secondary | ICD-10-CM | POA: Diagnosis not present

## 2023-12-10 DIAGNOSIS — R2689 Other abnormalities of gait and mobility: Secondary | ICD-10-CM | POA: Diagnosis not present

## 2023-12-10 DIAGNOSIS — R0602 Shortness of breath: Secondary | ICD-10-CM | POA: Diagnosis not present

## 2023-12-10 DIAGNOSIS — L603 Nail dystrophy: Secondary | ICD-10-CM | POA: Diagnosis not present

## 2023-12-10 DIAGNOSIS — I712 Thoracic aortic aneurysm, without rupture, unspecified: Secondary | ICD-10-CM | POA: Diagnosis not present

## 2023-12-10 DIAGNOSIS — Z48817 Encounter for surgical aftercare following surgery on the skin and subcutaneous tissue: Secondary | ICD-10-CM | POA: Diagnosis not present

## 2023-12-10 DIAGNOSIS — N3081 Other cystitis with hematuria: Secondary | ICD-10-CM | POA: Diagnosis not present

## 2023-12-10 DIAGNOSIS — E639 Nutritional deficiency, unspecified: Secondary | ICD-10-CM | POA: Diagnosis not present

## 2023-12-10 DIAGNOSIS — I2584 Coronary atherosclerosis due to calcified coronary lesion: Secondary | ICD-10-CM | POA: Diagnosis not present

## 2023-12-10 DIAGNOSIS — L89152 Pressure ulcer of sacral region, stage 2: Secondary | ICD-10-CM | POA: Diagnosis not present

## 2023-12-10 DIAGNOSIS — B965 Pseudomonas (aeruginosa) (mallei) (pseudomallei) as the cause of diseases classified elsewhere: Secondary | ICD-10-CM | POA: Diagnosis not present

## 2023-12-10 DIAGNOSIS — D62 Acute posthemorrhagic anemia: Secondary | ICD-10-CM | POA: Diagnosis not present

## 2023-12-10 DIAGNOSIS — B964 Proteus (mirabilis) (morganii) as the cause of diseases classified elsewhere: Secondary | ICD-10-CM | POA: Diagnosis not present

## 2023-12-10 DIAGNOSIS — R600 Localized edema: Secondary | ICD-10-CM | POA: Diagnosis not present

## 2023-12-10 DIAGNOSIS — N3942 Incontinence without sensory awareness: Secondary | ICD-10-CM | POA: Diagnosis not present

## 2023-12-10 DIAGNOSIS — D126 Benign neoplasm of colon, unspecified: Secondary | ICD-10-CM | POA: Diagnosis not present

## 2023-12-10 DIAGNOSIS — R7303 Prediabetes: Secondary | ICD-10-CM | POA: Diagnosis not present

## 2023-12-10 LAB — BASIC METABOLIC PANEL WITH GFR
Anion gap: 9 (ref 5–15)
BUN: 34 mg/dL — ABNORMAL HIGH (ref 8–23)
CO2: 27 mmol/L (ref 22–32)
Calcium: 9 mg/dL (ref 8.9–10.3)
Chloride: 98 mmol/L (ref 98–111)
Creatinine, Ser: 1.02 mg/dL (ref 0.61–1.24)
GFR, Estimated: >60 mL/min (ref >60)
Glucose, Bld: 94 mg/dL (ref 70–99)
Potassium: 4.3 mmol/L (ref 3.5–5.1)
Sodium: 134 mmol/L — ABNORMAL LOW (ref 135–145)

## 2023-12-10 LAB — PHOSPHORUS: Phosphorus: 3.5 mg/dL (ref 2.5–4.6)

## 2023-12-10 LAB — MAGNESIUM: Magnesium: 1.8 mg/dL (ref 1.7–2.4)

## 2023-12-10 MED ORDER — METOPROLOL TARTRATE 25 MG PO TABS: 25.0000 mg | ORAL_TABLET | Freq: Two times a day (BID) | ORAL | Status: DC

## 2023-12-10 NOTE — Progress Notes (Signed)
     Subjective: NAEON.  Still attempting to address constipation.  Foley catheter draining clear yellow urine.  Patient looks well today, reviewed case and plan  Objective: Vital signs in last 24 hours: Temp:  [97.5 F (36.4 C)-99.2 F (37.3 C)] 97.5 F (36.4 C) (08/29 0800) Pulse Rate:  [98-101] 101 (08/28 1149) Resp:  [18-25] 25 (08/28 1149) BP: (108-127)/(77-83) 127/83 (08/28 2100) SpO2:  [96 %-100 %] 96 % (08/29 0020)  Assessment/Plan: # Hematuria # Emphysematous cystitis # Morbid obesity  External beam radiation and radioactive seed placement 12/11/2010.  CBI clamped on the morning of 11/30/2023.  Urine still clear the morning of 8/20.  Discontinue CBI.  NGTD on UCx but Pseudomonas and Proteus positive on 10/22/2023 culture and sensitivity.  Would recommend at least one week of treatment for emphysematous cystitis.  Repeat CT collected.  Gas in bladder wall is clear.  Foley catheter is no longer necessary from the perspective of the emphysematous cystitis.  Successful voiding trial on 12/09/2023.  Patient was discharged yesterday afternoon.  Called back to bedside this morning with complaints of urinary incontinence.  Byard reports that he lost all sensation of the bladder after his remote XRT.  He is not aware of when he is urinating.  He only identifies the sensation of wetness or sometimes burning at the tip of the penis.  Interestingly, he has gotten some sensation back following his cystoscopy and stricture dilation.  He reports that he is only aware of urine passing when he is passing gas or having a bowel movement and it seems to be adequate in volume.  This would suggest that he still has some pelvic stool burden, but several large bowel movements are noted after his enema.  Could consider KUB for reassessment.  With quite a bit of pressure I was able to identify his bladder with ultrasound and bladder scan read to be approximately 25 mL.  He is not obstructing at this time and is  still cleared for discharge from urologic perspective.  Preserved renal function  Intake/Output from previous day: 08/28 0701 - 08/29 0700 In: 1080 [P.O.:1080] Out: 300 [Urine:300]  Intake/Output this shift: No intake/output data recorded.  Physical Exam:  General: Alert and oriented CV: No cyanosis Lungs: equal chest rise Abdomen: Soft, NTND, no rebound or guarding Gu: Foley removed  Lab Results: No results for input(s): HGB, HCT in the last 72 hours.  BMET Recent Labs    12/10/23 0434  NA 134*  K 4.3  CL 98  CO2 27  GLUCOSE 94  BUN 34*  CREATININE 1.02  CALCIUM  9.0     Studies/Results: No results found.      LOS: 11 days   Ole Bourdon, NP [facility] Pager: [phone]  12/10/2023, 10:09 AM

## 2023-12-10 NOTE — Plan of Care (Signed)

## 2023-12-10 NOTE — TOC Transition Note (Addendum)
 Transition of Care [facility]) - Discharge Note   Patient Details  Name: Curtis Hill MRN: [id number] Date of Birth: April 06, 1946  [facility]) CM/SW Contact:  Lauraine FORBES Saa, LCSWA Phone Number: 12/10/2023, 9:56 AM   Clinical Narrative:     Patient will DC to: [facility] SNF Anticipated DC date: 12/10/2923 Family notified: Kristian Mogg; Spouse; [phone] Transport by: [facility]   Per MD patient ready for DC to [facility]. RN to call report prior to discharge [phone]). RN, patient, patient's family, and facility notified of DC. Discharge Summary and FL2 sent to facility. DC packet on chart. Ambulance transport requested for patient at 9:54. CSW provided additional transportation resources.  CSW will sign off for now as social work intervention is no longer needed. Please consult us  again if new needs arise.    Final next level of care: Skilled Nursing Facility Barriers to Discharge: Barriers Resolved   Patient Goals and CMS Choice            Discharge Placement              Patient chooses bed at: Other - please specify in the comment section below: [facility]) Patient to be transferred to facility by: [facility] Name of family member notified: Ozell Juhasz; Spouse; [phone] Patient and family notified of of transfer: 12/10/23  Discharge Plan and Services Additional resources added to the After Visit Summary for                                       Social Drivers of Health (SDOH) Interventions SDOH Screenings   Food Insecurity: No Food Insecurity (11/30/2023)  Housing: Low Risk  (11/30/2023)  Transportation Needs: Unmet Transportation Needs (11/30/2023)  Utilities: Not At Risk (11/30/2023)  Depression (PHQ2-9): Medium Risk (05/23/2020)  Social Connections: Moderately Integrated (11/30/2023)  Tobacco Use: Low Risk  (11/28/2023)     Readmission Risk Interventions     No data to display

## 2023-12-15 DIAGNOSIS — K449 Diaphragmatic hernia without obstruction or gangrene: Secondary | ICD-10-CM | POA: Diagnosis not present

## 2023-12-15 DIAGNOSIS — M109 Gout, unspecified: Secondary | ICD-10-CM | POA: Diagnosis not present

## 2023-12-15 DIAGNOSIS — I10 Essential (primary) hypertension: Secondary | ICD-10-CM | POA: Diagnosis not present

## 2023-12-15 DIAGNOSIS — I2510 Atherosclerotic heart disease of native coronary artery without angina pectoris: Secondary | ICD-10-CM | POA: Diagnosis not present

## 2023-12-15 DIAGNOSIS — R600 Localized edema: Secondary | ICD-10-CM | POA: Diagnosis not present

## 2023-12-15 DIAGNOSIS — N3091 Cystitis, unspecified with hematuria: Secondary | ICD-10-CM | POA: Diagnosis not present

## 2023-12-15 DIAGNOSIS — Z6841 Body Mass Index (BMI) 40.0 and over, adult: Secondary | ICD-10-CM | POA: Diagnosis not present

## 2023-12-15 DIAGNOSIS — G629 Polyneuropathy, unspecified: Secondary | ICD-10-CM | POA: Diagnosis not present

## 2023-12-15 DIAGNOSIS — I7121 Aneurysm of the ascending aorta, without rupture: Secondary | ICD-10-CM | POA: Diagnosis not present

## 2023-12-15 DIAGNOSIS — E785 Hyperlipidemia, unspecified: Secondary | ICD-10-CM | POA: Diagnosis not present

## 2023-12-15 DIAGNOSIS — E538 Deficiency of other specified B group vitamins: Secondary | ICD-10-CM | POA: Diagnosis not present

## 2023-12-15 DIAGNOSIS — D126 Benign neoplasm of colon, unspecified: Secondary | ICD-10-CM | POA: Diagnosis not present

## 2023-12-16 DIAGNOSIS — J9611 Chronic respiratory failure with hypoxia: Secondary | ICD-10-CM | POA: Diagnosis not present

## 2023-12-16 DIAGNOSIS — I10 Essential (primary) hypertension: Secondary | ICD-10-CM | POA: Diagnosis not present

## 2023-12-16 DIAGNOSIS — G4733 Obstructive sleep apnea (adult) (pediatric): Secondary | ICD-10-CM | POA: Diagnosis not present

## 2023-12-16 DIAGNOSIS — R5381 Other malaise: Secondary | ICD-10-CM | POA: Diagnosis not present

## 2023-12-17 ENCOUNTER — Ambulatory Visit: Admitting: Podiatry

## 2023-12-17 ENCOUNTER — Ambulatory Visit

## 2023-12-17 DIAGNOSIS — L603 Nail dystrophy: Secondary | ICD-10-CM | POA: Diagnosis not present

## 2023-12-17 DIAGNOSIS — R2689 Other abnormalities of gait and mobility: Secondary | ICD-10-CM

## 2023-12-17 DIAGNOSIS — R29818 Other symptoms and signs involving the nervous system: Secondary | ICD-10-CM | POA: Diagnosis not present

## 2023-12-17 DIAGNOSIS — Z7401 Bed confinement status: Secondary | ICD-10-CM | POA: Diagnosis not present

## 2023-12-17 DIAGNOSIS — R5383 Other fatigue: Secondary | ICD-10-CM | POA: Diagnosis not present

## 2023-12-17 DIAGNOSIS — F4323 Adjustment disorder with mixed anxiety and depressed mood: Secondary | ICD-10-CM | POA: Diagnosis not present

## 2023-12-17 NOTE — Progress Notes (Addendum)
 Subjective:  Patient ID: Curtis Hill, male    DOB: 11-May-1945,  MRN: [id number]  Chief Complaint  Patient presents with   Foot Pain    Right foot pain     78 y.o. male presents with the above complaint.  Patient presents with left hallux thickened elongated dystrophic mycotic nail x 1.  He would like to have it removed he would like to make it permanent pain scale 7 out of 10 dull aching nature has not seen denies passing any is not a diabetic denies seeing any other acute issues.  Patient also has secondary complaint of neurological issues that is affecting his walking and causing some antalgic gait.  He was concern for possible Charcot deformity.  He does have a history of some low back pain.  He has some history of neuropathy pain.   Review of Systems: Negative except as noted in the HPI. Denies N/V/F/Ch.  Past Medical History:  Diagnosis Date   ABDOMINAL PAIN 05/18/2007   Qualifier: Diagnosis of  By: Henrietta LPN, Regina     ANEMIA, IRON DEFICIENCY 05/18/2007   Qualifier: Diagnosis of  By: Henrietta LPN, Regina     Aortic stenosis 07/19/2018   Mild by echo 05/2018   Arthritis    Back pain    Benign neoplasm of colon 03/28/2012   Bilateral swelling of feet and ankles    Cancer (HCC)    prostate cancer   Chronic back pain    Coronary artery calcification seen on CAT scan 03/17/2018   EPIGASTRIC PAIN 05/18/2007   Qualifier: Diagnosis of  By: Henrietta LPN, Regina     Fatty liver    GERD (gastroesophageal reflux disease)    HIATAL HERNIA 05/18/2007   Qualifier: Diagnosis of  By: Henrietta LPN, Regina     History of prostate cancer    Hyperlipidemia    Hypertension    Joint pain    LEUKOCYTOSIS 05/18/2007   Qualifier: Diagnosis of  By: Henrietta LPN, Regina     LIVER FUNCTION TESTS, ABNORMAL 05/18/2007   Qualifier: Diagnosis of  By: Henrietta LPN, Angeline MONAS OBESITY 05/18/2007   Qualifier: Diagnosis of  By: Henrietta LPN, Regina     Panniculitis    Pulmonary HTN (HCC) 11/02/2018   Shortness of breath on  exertion    SINUSITIS, ACUTE 05/18/2007   Qualifier: Diagnosis of  By: Henrietta LPN, Regina     Skin rash    Sleep apnea    Thoracic aortic aneurysm (HCC) 03/17/2018   45mm by echo 05/2018 and 43mm by chest CTA 03/2018   THROMBOCYTOSIS 05/18/2007   Qualifier: Diagnosis of  By: Henrietta LPN, Angeline      Current Outpatient Medications:    albuterol  (VENTOLIN  HFA) 108 (90 Base) MCG/ACT inhaler, Inhale 2 puffs into the lungs every 6 (six) hours as needed for wheezing or shortness of breath., Disp: , Rfl:    allopurinol  (ZYLOPRIM ) 300 MG tablet, Take 300 mg by mouth daily., Disp: , Rfl:    Amino Acids-Protein Hydrolys (FEEDING SUPPLEMENT, PRO-STAT SUGAR FREE 64,) LIQD, Take 30 mLs by mouth in the morning and at bedtime., Disp: , Rfl:    ammonium lactate (LAC-HYDRIN) 12 % lotion, Apply 1 Application topically 2 (two) times daily as needed for dry skin. Apply to bilateral legs and feet two times a day for dry, irritated skin. Rub in well., Disp: , Rfl:    ascorbic acid (VITAMIN C) 500 MG tablet, Take 500 mg by mouth 2 (  two) times daily., Disp: , Rfl:    aspirin  EC 81 MG tablet, Take 81 mg by mouth daily. Swallow whole., Disp: , Rfl:    atorvastatin  (LIPITOR) 20 MG tablet, Take 20 mg by mouth every evening., Disp: , Rfl:    calcium  carbonate (TUMS - DOSED IN MG ELEMENTAL CALCIUM ) 500 MG chewable tablet, Chew 1 tablet by mouth 2 (two) times daily. Give for calcium  supplementation, Disp: , Rfl:    cholecalciferol (VITAMIN D3) 25 MCG (1000 UNIT) tablet, Take 1,000 Units by mouth daily., Disp: , Rfl:    Coenzyme Q10 (COQ10) 200 MG CAPS, Take 1 capsule by mouth in the morning., Disp: , Rfl:    cyanocobalamin (VITAMIN B12) 500 MCG tablet, Take 1,000 mcg by mouth daily., Disp: , Rfl:    furosemide  (LASIX ) 40 MG tablet, Take 40 mg by mouth daily as needed for edema or fluid., Disp: , Rfl:    gabapentin  (NEURONTIN ) 100 MG capsule, Take 100 mg by mouth 3 (three) times daily. Give one capsule three times daily for nerve  pain ., Disp: , Rfl:    magnesium  oxide (MAG-OX) 400 (240 Mg) MG tablet, Take 400 mg by mouth daily., Disp: , Rfl:    metoprolol  tartrate (LOPRESSOR ) 25 MG tablet, Take 1 tablet (25 mg total) by mouth 2 (two) times daily., Disp: , Rfl:    Multiple Vitamins-Minerals (CENTRUM SILVER 50+MEN) TABS, Take 1 tablet by mouth in the morning., Disp: , Rfl:    Nystatin  (GERHARDT'S BUTT CREAM) CREA, Apply 1 Application topically 3 (three) times daily. (Patient not taking: Reported on 11/29/2023), Disp: 90 each, Rfl: 0   nystatin  (MYCOSTATIN /NYSTOP ) powder, Apply topically 3 (three) times daily. (Patient taking differently: Apply 1 Application topically 3 (three) times daily. Apply to groin topically three times daily.), Disp: , Rfl:    polyethylene glycol (MIRALAX  / GLYCOLAX ) 17 g packet, Take 17 g by mouth daily., Disp: , Rfl:    Propyl Glycol-Hydroxyethylcell (NASAL MOIST) GEL, Place 1 Units into both nostrils daily. Give for dryness, Disp: , Rfl:    Semaglutide-Weight Management 0.25 MG/0.5ML SOAJ, Inject 0.25 mg into the skin once a week. Inject every Wednesday., Disp: , Rfl:    senna-docusate (SENOKOT-S) 8.6-50 MG tablet, Take 1 tablet by mouth at bedtime as needed for mild constipation., Disp: , Rfl:    simethicone  (MYLICON) 80 MG chewable tablet, Chew 160 mg by mouth 3 (three) times daily after meals. Give for GI distress, Disp: , Rfl:    traMADol  (ULTRAM ) 50 MG tablet, Take 1 tablet (50 mg total) by mouth every 6 (six) hours as needed for moderate pain (pain score 4-6) or severe pain (pain score 7-10)., Disp: 5 tablet, Rfl: 0  Social History   Tobacco Use  Smoking Status Never  Smokeless Tobacco Never    Allergies  Allergen Reactions   Levofloxacin  Other (See Comments)    Aortic aneurysm   Lisinopril Anaphylaxis, Shortness Of Breath, Swelling and Rash   Prednisone  Swelling   Objective:  There were no vitals filed for this visit. There is no height or weight on file to calculate  BMI. Constitutional Well developed. Well nourished.  Vascular Dorsalis pedis pulses palpable bilaterally. Posterior tibial pulses palpable bilaterally. Capillary refill normal to all digits.  No cyanosis or clubbing noted. Pedal hair growth normal.  Neurologic Normal speech. Oriented to person, place, and time. Epicritic sensation to light touch grossly present bilaterally.  Dermatologic Pain on palpation of the entire/total nail on 1st digit of the left  No other open wounds. No skin lesions.  Orthopedic: Normal joint ROM without pain or crepitus bilaterally. No visible deformities. No bony tenderness.   Radiographs: None Assessment:   1. Nail dystrophy   2. Antalgic gait   3. Neurological deficit present    Plan:  Patient was evaluated and treated and all questions answered.  Neurological deficit/antalgic gait with history of back pain - All questions and concerns were discussed with the patient in extensive detail - Patient is already currently on gabapentin  as well.  At this time I discussed with the patient that due to the neurological deficit is likely leading to that antalgic gait and having trouble walking and causing a lot of foot discomfort and pain.SABRA  Unfortunately there is not a very good option for this.  He states understanding.  Nail contusion/dystrophy hallux, left -Patient elects to proceed with minor surgery to remove entire toenail today. Consent reviewed and signed by patient. -Entire/total nail excised. See procedure note. -Educated on post-procedure care including soaking. Written instructions provided and reviewed. -Patient to follow up in 2 weeks for nail check.  Procedure: Excision of entire/total nail  Location: Left 1st toe digit Anesthesia: Lidocaine  1% plain; 1.5 mL and Marcaine 0.5% plain; 1.5 mL, digital block. Skin Prep: Betadine. Dressing: Silvadene; telfa; dry, sterile, compression dressing. Technique: Following skin prep, the toe was  exsanguinated and a tourniquet was secured at the base of the toe. The affected nail border was freed and excised. The tourniquet was then removed and sterile dressing applied. Disposition: Patient tolerated procedure well. Patient to return in 2 weeks for follow-up.   No follow-ups on file.

## 2023-12-20 ENCOUNTER — Telehealth: Payer: Self-pay | Admitting: Podiatry

## 2023-12-20 DIAGNOSIS — K449 Diaphragmatic hernia without obstruction or gangrene: Secondary | ICD-10-CM | POA: Diagnosis not present

## 2023-12-20 DIAGNOSIS — I10 Essential (primary) hypertension: Secondary | ICD-10-CM | POA: Diagnosis not present

## 2023-12-20 DIAGNOSIS — D62 Acute posthemorrhagic anemia: Secondary | ICD-10-CM | POA: Diagnosis not present

## 2023-12-20 DIAGNOSIS — Z6841 Body Mass Index (BMI) 40.0 and over, adult: Secondary | ICD-10-CM | POA: Diagnosis not present

## 2023-12-20 DIAGNOSIS — G629 Polyneuropathy, unspecified: Secondary | ICD-10-CM | POA: Diagnosis not present

## 2023-12-20 DIAGNOSIS — E785 Hyperlipidemia, unspecified: Secondary | ICD-10-CM | POA: Diagnosis not present

## 2023-12-20 DIAGNOSIS — I2510 Atherosclerotic heart disease of native coronary artery without angina pectoris: Secondary | ICD-10-CM | POA: Diagnosis not present

## 2023-12-20 DIAGNOSIS — K5900 Constipation, unspecified: Secondary | ICD-10-CM | POA: Diagnosis not present

## 2023-12-20 DIAGNOSIS — E538 Deficiency of other specified B group vitamins: Secondary | ICD-10-CM | POA: Diagnosis not present

## 2023-12-20 DIAGNOSIS — M1990 Unspecified osteoarthritis, unspecified site: Secondary | ICD-10-CM | POA: Diagnosis not present

## 2023-12-20 NOTE — Telephone Encounter (Signed)
 Pt called in reference to appointment with Dr.Patel on Fri stating that everything that was dicussed was not in the summary report for the patient  The pt would like for Dr. Tobie to list the neurological issues that could be related to his feet pain and not being able to walk. The PT. Would like for someone to call him at [phone]. Thank you

## 2023-12-21 DIAGNOSIS — F4323 Adjustment disorder with mixed anxiety and depressed mood: Secondary | ICD-10-CM | POA: Diagnosis not present

## 2023-12-29 ENCOUNTER — Ambulatory Visit

## 2023-12-29 DIAGNOSIS — I2510 Atherosclerotic heart disease of native coronary artery without angina pectoris: Secondary | ICD-10-CM | POA: Diagnosis not present

## 2023-12-29 DIAGNOSIS — I10 Essential (primary) hypertension: Secondary | ICD-10-CM | POA: Diagnosis not present

## 2023-12-29 DIAGNOSIS — M1990 Unspecified osteoarthritis, unspecified site: Secondary | ICD-10-CM | POA: Diagnosis not present

## 2023-12-29 DIAGNOSIS — N390 Urinary tract infection, site not specified: Secondary | ICD-10-CM | POA: Diagnosis not present

## 2023-12-29 DIAGNOSIS — G629 Polyneuropathy, unspecified: Secondary | ICD-10-CM | POA: Diagnosis not present

## 2023-12-29 DIAGNOSIS — K5900 Constipation, unspecified: Secondary | ICD-10-CM | POA: Diagnosis not present

## 2024-01-01 DIAGNOSIS — B965 Pseudomonas (aeruginosa) (mallei) (pseudomallei) as the cause of diseases classified elsewhere: Secondary | ICD-10-CM | POA: Diagnosis not present

## 2024-01-01 DIAGNOSIS — M793 Panniculitis, unspecified: Secondary | ICD-10-CM | POA: Diagnosis not present

## 2024-01-01 DIAGNOSIS — I272 Pulmonary hypertension, unspecified: Secondary | ICD-10-CM | POA: Diagnosis not present

## 2024-01-01 DIAGNOSIS — I712 Thoracic aortic aneurysm, without rupture, unspecified: Secondary | ICD-10-CM | POA: Diagnosis not present

## 2024-01-01 DIAGNOSIS — I2584 Coronary atherosclerosis due to calcified coronary lesion: Secondary | ICD-10-CM | POA: Diagnosis not present

## 2024-01-01 DIAGNOSIS — L89152 Pressure ulcer of sacral region, stage 2: Secondary | ICD-10-CM | POA: Diagnosis not present

## 2024-01-01 DIAGNOSIS — D500 Iron deficiency anemia secondary to blood loss (chronic): Secondary | ICD-10-CM | POA: Diagnosis not present

## 2024-01-01 DIAGNOSIS — Z48817 Encounter for surgical aftercare following surgery on the skin and subcutaneous tissue: Secondary | ICD-10-CM | POA: Diagnosis not present

## 2024-01-01 DIAGNOSIS — B964 Proteus (mirabilis) (morganii) as the cause of diseases classified elsewhere: Secondary | ICD-10-CM | POA: Diagnosis not present

## 2024-01-01 DIAGNOSIS — I2510 Atherosclerotic heart disease of native coronary artery without angina pectoris: Secondary | ICD-10-CM | POA: Diagnosis not present

## 2024-01-01 DIAGNOSIS — I10 Essential (primary) hypertension: Secondary | ICD-10-CM | POA: Diagnosis not present

## 2024-01-01 DIAGNOSIS — N3081 Other cystitis with hematuria: Secondary | ICD-10-CM | POA: Diagnosis not present

## 2024-01-04 DIAGNOSIS — I10 Essential (primary) hypertension: Secondary | ICD-10-CM | POA: Diagnosis not present

## 2024-01-04 DIAGNOSIS — G629 Polyneuropathy, unspecified: Secondary | ICD-10-CM | POA: Diagnosis not present

## 2024-01-04 DIAGNOSIS — I2510 Atherosclerotic heart disease of native coronary artery without angina pectoris: Secondary | ICD-10-CM | POA: Diagnosis not present

## 2024-01-04 DIAGNOSIS — D62 Acute posthemorrhagic anemia: Secondary | ICD-10-CM | POA: Diagnosis not present

## 2024-01-04 DIAGNOSIS — N390 Urinary tract infection, site not specified: Secondary | ICD-10-CM | POA: Diagnosis not present

## 2024-01-04 DIAGNOSIS — K5900 Constipation, unspecified: Secondary | ICD-10-CM | POA: Diagnosis not present

## 2024-01-04 DIAGNOSIS — M1990 Unspecified osteoarthritis, unspecified site: Secondary | ICD-10-CM | POA: Diagnosis not present

## 2024-01-06 DIAGNOSIS — G629 Polyneuropathy, unspecified: Secondary | ICD-10-CM | POA: Diagnosis not present

## 2024-01-06 DIAGNOSIS — B965 Pseudomonas (aeruginosa) (mallei) (pseudomallei) as the cause of diseases classified elsewhere: Secondary | ICD-10-CM | POA: Diagnosis not present

## 2024-01-06 DIAGNOSIS — J45909 Unspecified asthma, uncomplicated: Secondary | ICD-10-CM | POA: Diagnosis not present

## 2024-01-06 DIAGNOSIS — I350 Nonrheumatic aortic (valve) stenosis: Secondary | ICD-10-CM | POA: Diagnosis not present

## 2024-01-06 DIAGNOSIS — L89152 Pressure ulcer of sacral region, stage 2: Secondary | ICD-10-CM | POA: Diagnosis not present

## 2024-01-06 DIAGNOSIS — I2584 Coronary atherosclerosis due to calcified coronary lesion: Secondary | ICD-10-CM | POA: Diagnosis not present

## 2024-01-06 DIAGNOSIS — I712 Thoracic aortic aneurysm, without rupture, unspecified: Secondary | ICD-10-CM | POA: Diagnosis not present

## 2024-01-06 DIAGNOSIS — D500 Iron deficiency anemia secondary to blood loss (chronic): Secondary | ICD-10-CM | POA: Diagnosis not present

## 2024-01-06 DIAGNOSIS — M109 Gout, unspecified: Secondary | ICD-10-CM | POA: Diagnosis not present

## 2024-01-06 DIAGNOSIS — K760 Fatty (change of) liver, not elsewhere classified: Secondary | ICD-10-CM | POA: Diagnosis not present

## 2024-01-06 DIAGNOSIS — N3081 Other cystitis with hematuria: Secondary | ICD-10-CM | POA: Diagnosis not present

## 2024-01-06 DIAGNOSIS — E66813 Obesity, class 3: Secondary | ICD-10-CM | POA: Diagnosis not present

## 2024-01-06 DIAGNOSIS — Z7982 Long term (current) use of aspirin: Secondary | ICD-10-CM | POA: Diagnosis not present

## 2024-01-06 DIAGNOSIS — I2510 Atherosclerotic heart disease of native coronary artery without angina pectoris: Secondary | ICD-10-CM | POA: Diagnosis not present

## 2024-01-06 DIAGNOSIS — I10 Essential (primary) hypertension: Secondary | ICD-10-CM | POA: Diagnosis not present

## 2024-01-06 DIAGNOSIS — Z5982 Transportation insecurity: Secondary | ICD-10-CM | POA: Diagnosis not present

## 2024-01-06 DIAGNOSIS — E538 Deficiency of other specified B group vitamins: Secondary | ICD-10-CM | POA: Diagnosis not present

## 2024-01-06 DIAGNOSIS — Z6841 Body Mass Index (BMI) 40.0 and over, adult: Secondary | ICD-10-CM | POA: Diagnosis not present

## 2024-01-06 DIAGNOSIS — J9611 Chronic respiratory failure with hypoxia: Secondary | ICD-10-CM | POA: Diagnosis not present

## 2024-01-06 DIAGNOSIS — Z48817 Encounter for surgical aftercare following surgery on the skin and subcutaneous tissue: Secondary | ICD-10-CM | POA: Diagnosis not present

## 2024-01-06 DIAGNOSIS — I272 Pulmonary hypertension, unspecified: Secondary | ICD-10-CM | POA: Diagnosis not present

## 2024-01-06 DIAGNOSIS — B964 Proteus (mirabilis) (morganii) as the cause of diseases classified elsewhere: Secondary | ICD-10-CM | POA: Diagnosis not present

## 2024-01-06 DIAGNOSIS — M793 Panniculitis, unspecified: Secondary | ICD-10-CM | POA: Diagnosis not present

## 2024-01-06 DIAGNOSIS — I872 Venous insufficiency (chronic) (peripheral): Secondary | ICD-10-CM | POA: Diagnosis not present

## 2024-01-06 DIAGNOSIS — Z8546 Personal history of malignant neoplasm of prostate: Secondary | ICD-10-CM | POA: Diagnosis not present

## 2024-01-10 DIAGNOSIS — M793 Panniculitis, unspecified: Secondary | ICD-10-CM | POA: Diagnosis not present

## 2024-01-10 DIAGNOSIS — B964 Proteus (mirabilis) (morganii) as the cause of diseases classified elsewhere: Secondary | ICD-10-CM | POA: Diagnosis not present

## 2024-01-10 DIAGNOSIS — N3081 Other cystitis with hematuria: Secondary | ICD-10-CM | POA: Diagnosis not present

## 2024-01-10 DIAGNOSIS — L89152 Pressure ulcer of sacral region, stage 2: Secondary | ICD-10-CM | POA: Diagnosis not present

## 2024-01-10 DIAGNOSIS — B965 Pseudomonas (aeruginosa) (mallei) (pseudomallei) as the cause of diseases classified elsewhere: Secondary | ICD-10-CM | POA: Diagnosis not present

## 2024-01-10 DIAGNOSIS — Z48817 Encounter for surgical aftercare following surgery on the skin and subcutaneous tissue: Secondary | ICD-10-CM | POA: Diagnosis not present

## 2024-01-14 DIAGNOSIS — B965 Pseudomonas (aeruginosa) (mallei) (pseudomallei) as the cause of diseases classified elsewhere: Secondary | ICD-10-CM | POA: Diagnosis not present

## 2024-01-14 DIAGNOSIS — M793 Panniculitis, unspecified: Secondary | ICD-10-CM | POA: Diagnosis not present

## 2024-01-14 DIAGNOSIS — L89152 Pressure ulcer of sacral region, stage 2: Secondary | ICD-10-CM | POA: Diagnosis not present

## 2024-01-14 DIAGNOSIS — N3081 Other cystitis with hematuria: Secondary | ICD-10-CM | POA: Diagnosis not present

## 2024-01-14 DIAGNOSIS — Z48817 Encounter for surgical aftercare following surgery on the skin and subcutaneous tissue: Secondary | ICD-10-CM | POA: Diagnosis not present

## 2024-01-14 DIAGNOSIS — B964 Proteus (mirabilis) (morganii) as the cause of diseases classified elsewhere: Secondary | ICD-10-CM | POA: Diagnosis not present

## 2024-01-17 DIAGNOSIS — Z48817 Encounter for surgical aftercare following surgery on the skin and subcutaneous tissue: Secondary | ICD-10-CM | POA: Diagnosis not present

## 2024-01-17 DIAGNOSIS — B965 Pseudomonas (aeruginosa) (mallei) (pseudomallei) as the cause of diseases classified elsewhere: Secondary | ICD-10-CM | POA: Diagnosis not present

## 2024-01-17 DIAGNOSIS — M793 Panniculitis, unspecified: Secondary | ICD-10-CM | POA: Diagnosis not present

## 2024-01-17 DIAGNOSIS — L89152 Pressure ulcer of sacral region, stage 2: Secondary | ICD-10-CM | POA: Diagnosis not present

## 2024-01-17 DIAGNOSIS — N3081 Other cystitis with hematuria: Secondary | ICD-10-CM | POA: Diagnosis not present

## 2024-01-17 DIAGNOSIS — B964 Proteus (mirabilis) (morganii) as the cause of diseases classified elsewhere: Secondary | ICD-10-CM | POA: Diagnosis not present

## 2024-01-19 DIAGNOSIS — L89152 Pressure ulcer of sacral region, stage 2: Secondary | ICD-10-CM | POA: Diagnosis not present

## 2024-01-19 DIAGNOSIS — M793 Panniculitis, unspecified: Secondary | ICD-10-CM | POA: Diagnosis not present

## 2024-01-19 DIAGNOSIS — Z48817 Encounter for surgical aftercare following surgery on the skin and subcutaneous tissue: Secondary | ICD-10-CM | POA: Diagnosis not present

## 2024-01-19 DIAGNOSIS — B964 Proteus (mirabilis) (morganii) as the cause of diseases classified elsewhere: Secondary | ICD-10-CM | POA: Diagnosis not present

## 2024-01-19 DIAGNOSIS — N3081 Other cystitis with hematuria: Secondary | ICD-10-CM | POA: Diagnosis not present

## 2024-01-19 DIAGNOSIS — B965 Pseudomonas (aeruginosa) (mallei) (pseudomallei) as the cause of diseases classified elsewhere: Secondary | ICD-10-CM | POA: Diagnosis not present

## 2024-01-20 DIAGNOSIS — L89152 Pressure ulcer of sacral region, stage 2: Secondary | ICD-10-CM | POA: Diagnosis not present

## 2024-01-20 DIAGNOSIS — M793 Panniculitis, unspecified: Secondary | ICD-10-CM | POA: Diagnosis not present

## 2024-01-20 DIAGNOSIS — B964 Proteus (mirabilis) (morganii) as the cause of diseases classified elsewhere: Secondary | ICD-10-CM | POA: Diagnosis not present

## 2024-01-20 DIAGNOSIS — Z48817 Encounter for surgical aftercare following surgery on the skin and subcutaneous tissue: Secondary | ICD-10-CM | POA: Diagnosis not present

## 2024-01-20 DIAGNOSIS — B965 Pseudomonas (aeruginosa) (mallei) (pseudomallei) as the cause of diseases classified elsewhere: Secondary | ICD-10-CM | POA: Diagnosis not present

## 2024-01-20 DIAGNOSIS — N3081 Other cystitis with hematuria: Secondary | ICD-10-CM | POA: Diagnosis not present

## 2024-01-21 DIAGNOSIS — B965 Pseudomonas (aeruginosa) (mallei) (pseudomallei) as the cause of diseases classified elsewhere: Secondary | ICD-10-CM | POA: Diagnosis not present

## 2024-01-21 DIAGNOSIS — N3081 Other cystitis with hematuria: Secondary | ICD-10-CM | POA: Diagnosis not present

## 2024-01-21 DIAGNOSIS — M793 Panniculitis, unspecified: Secondary | ICD-10-CM | POA: Diagnosis not present

## 2024-01-21 DIAGNOSIS — Z48817 Encounter for surgical aftercare following surgery on the skin and subcutaneous tissue: Secondary | ICD-10-CM | POA: Diagnosis not present

## 2024-01-21 DIAGNOSIS — B964 Proteus (mirabilis) (morganii) as the cause of diseases classified elsewhere: Secondary | ICD-10-CM | POA: Diagnosis not present

## 2024-01-21 DIAGNOSIS — L89152 Pressure ulcer of sacral region, stage 2: Secondary | ICD-10-CM | POA: Diagnosis not present

## 2024-01-25 DIAGNOSIS — M793 Panniculitis, unspecified: Secondary | ICD-10-CM | POA: Diagnosis not present

## 2024-01-25 DIAGNOSIS — B964 Proteus (mirabilis) (morganii) as the cause of diseases classified elsewhere: Secondary | ICD-10-CM | POA: Diagnosis not present

## 2024-01-25 DIAGNOSIS — Z48817 Encounter for surgical aftercare following surgery on the skin and subcutaneous tissue: Secondary | ICD-10-CM | POA: Diagnosis not present

## 2024-01-25 DIAGNOSIS — B965 Pseudomonas (aeruginosa) (mallei) (pseudomallei) as the cause of diseases classified elsewhere: Secondary | ICD-10-CM | POA: Diagnosis not present

## 2024-01-25 DIAGNOSIS — L89152 Pressure ulcer of sacral region, stage 2: Secondary | ICD-10-CM | POA: Diagnosis not present

## 2024-01-25 DIAGNOSIS — N3081 Other cystitis with hematuria: Secondary | ICD-10-CM | POA: Diagnosis not present

## 2024-01-27 DIAGNOSIS — B965 Pseudomonas (aeruginosa) (mallei) (pseudomallei) as the cause of diseases classified elsewhere: Secondary | ICD-10-CM | POA: Diagnosis not present

## 2024-01-27 DIAGNOSIS — L89152 Pressure ulcer of sacral region, stage 2: Secondary | ICD-10-CM | POA: Diagnosis not present

## 2024-01-27 DIAGNOSIS — N3081 Other cystitis with hematuria: Secondary | ICD-10-CM | POA: Diagnosis not present

## 2024-01-27 DIAGNOSIS — M793 Panniculitis, unspecified: Secondary | ICD-10-CM | POA: Diagnosis not present

## 2024-01-27 DIAGNOSIS — Z48817 Encounter for surgical aftercare following surgery on the skin and subcutaneous tissue: Secondary | ICD-10-CM | POA: Diagnosis not present

## 2024-01-27 DIAGNOSIS — B964 Proteus (mirabilis) (morganii) as the cause of diseases classified elsewhere: Secondary | ICD-10-CM | POA: Diagnosis not present

## 2024-01-31 ENCOUNTER — Ambulatory Visit

## 2024-01-31 DIAGNOSIS — M793 Panniculitis, unspecified: Secondary | ICD-10-CM | POA: Diagnosis not present

## 2024-01-31 DIAGNOSIS — B965 Pseudomonas (aeruginosa) (mallei) (pseudomallei) as the cause of diseases classified elsewhere: Secondary | ICD-10-CM | POA: Diagnosis not present

## 2024-01-31 DIAGNOSIS — Z48817 Encounter for surgical aftercare following surgery on the skin and subcutaneous tissue: Secondary | ICD-10-CM | POA: Diagnosis not present

## 2024-01-31 DIAGNOSIS — B964 Proteus (mirabilis) (morganii) as the cause of diseases classified elsewhere: Secondary | ICD-10-CM | POA: Diagnosis not present

## 2024-01-31 DIAGNOSIS — N3081 Other cystitis with hematuria: Secondary | ICD-10-CM | POA: Diagnosis not present

## 2024-01-31 DIAGNOSIS — L89152 Pressure ulcer of sacral region, stage 2: Secondary | ICD-10-CM | POA: Diagnosis not present

## 2024-01-31 NOTE — Progress Notes (Deleted)
 [address] [place] [address]             [phone]            NAFTULI DALSANTO [id number] July 04, 1945   History of Present Illness:      Current Outpatient Medications on File Prior to Visit  Medication Sig Dispense Refill   albuterol  (VENTOLIN  HFA) 108 (90 Base) MCG/ACT inhaler Inhale 2 puffs into the lungs every 6 (six) hours as needed for wheezing or shortness of breath.     allopurinol  (ZYLOPRIM ) 300 MG tablet Take 300 mg by mouth daily.     Amino Acids-Protein Hydrolys (FEEDING SUPPLEMENT, PRO-STAT SUGAR FREE 64,) LIQD Take 30 mLs by mouth in the morning and at bedtime.     ammonium lactate (LAC-HYDRIN) 12 % lotion Apply 1 Application topically 2 (two) times daily as needed for dry skin. Apply to bilateral legs and feet two times a day for dry, irritated skin. Rub in well.     ascorbic acid (VITAMIN C) 500 MG tablet Take 500 mg by mouth 2 (two) times daily.     aspirin  EC 81 MG tablet Take 81 mg by mouth daily. Swallow whole.     atorvastatin  (LIPITOR) 20 MG tablet Take 20 mg by mouth every evening.     calcium  carbonate (TUMS - DOSED IN MG ELEMENTAL CALCIUM ) 500 MG chewable tablet Chew 1 tablet by mouth 2 (two) times daily. Give for calcium  supplementation     cholecalciferol (VITAMIN D3) 25 MCG (1000 UNIT) tablet Take 1,000 Units by mouth daily.     Coenzyme Q10 (COQ10) 200 MG CAPS Take 1 capsule by mouth in the morning.     cyanocobalamin (VITAMIN B12) 500 MCG tablet Take 1,000 mcg by mouth daily.     furosemide  (LASIX ) 40 MG tablet Take 40 mg by mouth daily as needed for edema or fluid.     gabapentin  (NEURONTIN ) 100 MG capsule Take 100 mg by mouth 3 (three) times daily. Give one capsule three times daily for nerve pain .     magnesium  oxide (MAG-OX) 400 (240 Mg) MG tablet Take 400 mg by mouth daily.     metoprolol  tartrate (LOPRESSOR ) 25 MG tablet Take 1 tablet (25 mg total) by mouth 2 (two) times daily.     Multiple Vitamins-Minerals  (CENTRUM SILVER 50+MEN) TABS Take 1 tablet by mouth in the morning.     Nystatin  (GERHARDT'S BUTT CREAM) CREA Apply 1 Application topically 3 (three) times daily. (Patient not taking: Reported on 11/29/2023) 90 each 0   nystatin  (MYCOSTATIN /NYSTOP ) powder Apply topically 3 (three) times daily. (Patient taking differently: Apply 1 Application topically 3 (three) times daily. Apply to groin topically three times daily.)     polyethylene glycol (MIRALAX  / GLYCOLAX ) 17 g packet Take 17 g by mouth daily.     Propyl Glycol-Hydroxyethylcell (NASAL MOIST) GEL Place 1 Units into both nostrils daily. Give for dryness     Semaglutide-Weight Management 0.25 MG/0.5ML SOAJ Inject 0.25 mg into the skin once a week. Inject every Wednesday.     senna-docusate (SENOKOT-S) 8.6-50 MG tablet Take 1 tablet by mouth at bedtime as needed for mild constipation.     simethicone  (MYLICON) 80 MG chewable tablet Chew 160 mg by mouth 3 (three) times daily after meals. Give for GI distress     traMADol  (ULTRAM ) 50 MG tablet Take 1 tablet (50 mg total) by mouth every 6 (  six) hours as needed for moderate pain (pain score 4-6) or severe pain (pain score 7-10). 5 tablet 0   No current facility-administered medications on file prior to visit.     ROS:   There were no vitals taken for this visit.    Imaging:      A/P:      Risk Modification:  Statin:  ***  Smoking cessation instruction/counseling given:  {CHL AMB [facility] SMOKING CESSATION COUNSELING:20758}  Patient was counseled on importance of Blood Pressure Control  They are instructed to contact their Primary Care Physician if they start to have blood pressure readings over 130s/90s. Do not ever stop blood pressure medications on your own, unless instructed by healthcare professional.  Please avoid use of Fluoroquinolones as this can potentially increase your risk of Aortic Rupture and/or Dissection  Patient educated on signs and symptoms of Aortic Dissection,  handout also provided in AVS  Manuelita CHRISTELLA Rough, PA-C 01/31/24

## 2024-02-02 DIAGNOSIS — L89152 Pressure ulcer of sacral region, stage 2: Secondary | ICD-10-CM | POA: Diagnosis not present

## 2024-02-02 DIAGNOSIS — B964 Proteus (mirabilis) (morganii) as the cause of diseases classified elsewhere: Secondary | ICD-10-CM | POA: Diagnosis not present

## 2024-02-02 DIAGNOSIS — N3081 Other cystitis with hematuria: Secondary | ICD-10-CM | POA: Diagnosis not present

## 2024-02-02 DIAGNOSIS — Z48817 Encounter for surgical aftercare following surgery on the skin and subcutaneous tissue: Secondary | ICD-10-CM | POA: Diagnosis not present

## 2024-02-02 DIAGNOSIS — B965 Pseudomonas (aeruginosa) (mallei) (pseudomallei) as the cause of diseases classified elsewhere: Secondary | ICD-10-CM | POA: Diagnosis not present

## 2024-02-02 DIAGNOSIS — M793 Panniculitis, unspecified: Secondary | ICD-10-CM | POA: Diagnosis not present

## 2024-02-04 DIAGNOSIS — M793 Panniculitis, unspecified: Secondary | ICD-10-CM | POA: Diagnosis not present

## 2024-02-04 DIAGNOSIS — L89152 Pressure ulcer of sacral region, stage 2: Secondary | ICD-10-CM | POA: Diagnosis not present

## 2024-02-04 DIAGNOSIS — N3081 Other cystitis with hematuria: Secondary | ICD-10-CM | POA: Diagnosis not present

## 2024-02-04 DIAGNOSIS — B964 Proteus (mirabilis) (morganii) as the cause of diseases classified elsewhere: Secondary | ICD-10-CM | POA: Diagnosis not present

## 2024-02-04 DIAGNOSIS — B965 Pseudomonas (aeruginosa) (mallei) (pseudomallei) as the cause of diseases classified elsewhere: Secondary | ICD-10-CM | POA: Diagnosis not present

## 2024-02-04 DIAGNOSIS — Z48817 Encounter for surgical aftercare following surgery on the skin and subcutaneous tissue: Secondary | ICD-10-CM | POA: Diagnosis not present

## 2024-02-05 DIAGNOSIS — N3081 Other cystitis with hematuria: Secondary | ICD-10-CM | POA: Diagnosis not present

## 2024-02-05 DIAGNOSIS — J9611 Chronic respiratory failure with hypoxia: Secondary | ICD-10-CM | POA: Diagnosis not present

## 2024-02-05 DIAGNOSIS — D500 Iron deficiency anemia secondary to blood loss (chronic): Secondary | ICD-10-CM | POA: Diagnosis not present

## 2024-02-05 DIAGNOSIS — Z6841 Body Mass Index (BMI) 40.0 and over, adult: Secondary | ICD-10-CM | POA: Diagnosis not present

## 2024-02-05 DIAGNOSIS — E538 Deficiency of other specified B group vitamins: Secondary | ICD-10-CM | POA: Diagnosis not present

## 2024-02-05 DIAGNOSIS — K760 Fatty (change of) liver, not elsewhere classified: Secondary | ICD-10-CM | POA: Diagnosis not present

## 2024-02-05 DIAGNOSIS — B965 Pseudomonas (aeruginosa) (mallei) (pseudomallei) as the cause of diseases classified elsewhere: Secondary | ICD-10-CM | POA: Diagnosis not present

## 2024-02-05 DIAGNOSIS — I2510 Atherosclerotic heart disease of native coronary artery without angina pectoris: Secondary | ICD-10-CM | POA: Diagnosis not present

## 2024-02-05 DIAGNOSIS — L89152 Pressure ulcer of sacral region, stage 2: Secondary | ICD-10-CM | POA: Diagnosis not present

## 2024-02-05 DIAGNOSIS — J45909 Unspecified asthma, uncomplicated: Secondary | ICD-10-CM | POA: Diagnosis not present

## 2024-02-05 DIAGNOSIS — G629 Polyneuropathy, unspecified: Secondary | ICD-10-CM | POA: Diagnosis not present

## 2024-02-05 DIAGNOSIS — Z48817 Encounter for surgical aftercare following surgery on the skin and subcutaneous tissue: Secondary | ICD-10-CM | POA: Diagnosis not present

## 2024-02-05 DIAGNOSIS — Z5982 Transportation insecurity: Secondary | ICD-10-CM | POA: Diagnosis not present

## 2024-02-05 DIAGNOSIS — Z8546 Personal history of malignant neoplasm of prostate: Secondary | ICD-10-CM | POA: Diagnosis not present

## 2024-02-05 DIAGNOSIS — I872 Venous insufficiency (chronic) (peripheral): Secondary | ICD-10-CM | POA: Diagnosis not present

## 2024-02-05 DIAGNOSIS — B964 Proteus (mirabilis) (morganii) as the cause of diseases classified elsewhere: Secondary | ICD-10-CM | POA: Diagnosis not present

## 2024-02-05 DIAGNOSIS — M109 Gout, unspecified: Secondary | ICD-10-CM | POA: Diagnosis not present

## 2024-02-05 DIAGNOSIS — Z7982 Long term (current) use of aspirin: Secondary | ICD-10-CM | POA: Diagnosis not present

## 2024-02-05 DIAGNOSIS — I2584 Coronary atherosclerosis due to calcified coronary lesion: Secondary | ICD-10-CM | POA: Diagnosis not present

## 2024-02-05 DIAGNOSIS — E66813 Obesity, class 3: Secondary | ICD-10-CM | POA: Diagnosis not present

## 2024-02-05 DIAGNOSIS — I350 Nonrheumatic aortic (valve) stenosis: Secondary | ICD-10-CM | POA: Diagnosis not present

## 2024-02-05 DIAGNOSIS — I10 Essential (primary) hypertension: Secondary | ICD-10-CM | POA: Diagnosis not present

## 2024-02-05 DIAGNOSIS — M793 Panniculitis, unspecified: Secondary | ICD-10-CM | POA: Diagnosis not present

## 2024-02-05 DIAGNOSIS — I712 Thoracic aortic aneurysm, without rupture, unspecified: Secondary | ICD-10-CM | POA: Diagnosis not present

## 2024-02-05 DIAGNOSIS — I272 Pulmonary hypertension, unspecified: Secondary | ICD-10-CM | POA: Diagnosis not present

## 2024-02-07 DIAGNOSIS — M793 Panniculitis, unspecified: Secondary | ICD-10-CM | POA: Diagnosis not present

## 2024-02-07 DIAGNOSIS — L89152 Pressure ulcer of sacral region, stage 2: Secondary | ICD-10-CM | POA: Diagnosis not present

## 2024-02-07 DIAGNOSIS — N3081 Other cystitis with hematuria: Secondary | ICD-10-CM | POA: Diagnosis not present

## 2024-02-07 DIAGNOSIS — B964 Proteus (mirabilis) (morganii) as the cause of diseases classified elsewhere: Secondary | ICD-10-CM | POA: Diagnosis not present

## 2024-02-07 DIAGNOSIS — Z48817 Encounter for surgical aftercare following surgery on the skin and subcutaneous tissue: Secondary | ICD-10-CM | POA: Diagnosis not present

## 2024-02-07 DIAGNOSIS — B965 Pseudomonas (aeruginosa) (mallei) (pseudomallei) as the cause of diseases classified elsewhere: Secondary | ICD-10-CM | POA: Diagnosis not present

## 2024-02-10 DIAGNOSIS — B964 Proteus (mirabilis) (morganii) as the cause of diseases classified elsewhere: Secondary | ICD-10-CM | POA: Diagnosis not present

## 2024-02-10 DIAGNOSIS — Z48817 Encounter for surgical aftercare following surgery on the skin and subcutaneous tissue: Secondary | ICD-10-CM | POA: Diagnosis not present

## 2024-02-10 DIAGNOSIS — L89152 Pressure ulcer of sacral region, stage 2: Secondary | ICD-10-CM | POA: Diagnosis not present

## 2024-02-10 DIAGNOSIS — B965 Pseudomonas (aeruginosa) (mallei) (pseudomallei) as the cause of diseases classified elsewhere: Secondary | ICD-10-CM | POA: Diagnosis not present

## 2024-02-10 DIAGNOSIS — N3081 Other cystitis with hematuria: Secondary | ICD-10-CM | POA: Diagnosis not present

## 2024-02-10 DIAGNOSIS — M793 Panniculitis, unspecified: Secondary | ICD-10-CM | POA: Diagnosis not present

## 2024-02-15 DIAGNOSIS — B964 Proteus (mirabilis) (morganii) as the cause of diseases classified elsewhere: Secondary | ICD-10-CM | POA: Diagnosis not present

## 2024-02-15 DIAGNOSIS — B965 Pseudomonas (aeruginosa) (mallei) (pseudomallei) as the cause of diseases classified elsewhere: Secondary | ICD-10-CM | POA: Diagnosis not present

## 2024-02-15 DIAGNOSIS — L89152 Pressure ulcer of sacral region, stage 2: Secondary | ICD-10-CM | POA: Diagnosis not present

## 2024-02-15 DIAGNOSIS — N3081 Other cystitis with hematuria: Secondary | ICD-10-CM | POA: Diagnosis not present

## 2024-02-15 DIAGNOSIS — M793 Panniculitis, unspecified: Secondary | ICD-10-CM | POA: Diagnosis not present

## 2024-02-15 DIAGNOSIS — Z48817 Encounter for surgical aftercare following surgery on the skin and subcutaneous tissue: Secondary | ICD-10-CM | POA: Diagnosis not present

## 2024-02-21 DIAGNOSIS — B965 Pseudomonas (aeruginosa) (mallei) (pseudomallei) as the cause of diseases classified elsewhere: Secondary | ICD-10-CM | POA: Diagnosis not present

## 2024-02-21 DIAGNOSIS — N3081 Other cystitis with hematuria: Secondary | ICD-10-CM | POA: Diagnosis not present

## 2024-02-21 DIAGNOSIS — Z48817 Encounter for surgical aftercare following surgery on the skin and subcutaneous tissue: Secondary | ICD-10-CM | POA: Diagnosis not present

## 2024-02-21 DIAGNOSIS — B964 Proteus (mirabilis) (morganii) as the cause of diseases classified elsewhere: Secondary | ICD-10-CM | POA: Diagnosis not present

## 2024-02-21 DIAGNOSIS — L89152 Pressure ulcer of sacral region, stage 2: Secondary | ICD-10-CM | POA: Diagnosis not present

## 2024-02-21 DIAGNOSIS — M793 Panniculitis, unspecified: Secondary | ICD-10-CM | POA: Diagnosis not present

## 2024-02-22 DIAGNOSIS — B965 Pseudomonas (aeruginosa) (mallei) (pseudomallei) as the cause of diseases classified elsewhere: Secondary | ICD-10-CM | POA: Diagnosis not present

## 2024-02-22 DIAGNOSIS — M793 Panniculitis, unspecified: Secondary | ICD-10-CM | POA: Diagnosis not present

## 2024-02-22 DIAGNOSIS — B964 Proteus (mirabilis) (morganii) as the cause of diseases classified elsewhere: Secondary | ICD-10-CM | POA: Diagnosis not present

## 2024-02-22 DIAGNOSIS — L89152 Pressure ulcer of sacral region, stage 2: Secondary | ICD-10-CM | POA: Diagnosis not present

## 2024-02-22 DIAGNOSIS — Z48817 Encounter for surgical aftercare following surgery on the skin and subcutaneous tissue: Secondary | ICD-10-CM | POA: Diagnosis not present

## 2024-02-22 DIAGNOSIS — N3081 Other cystitis with hematuria: Secondary | ICD-10-CM | POA: Diagnosis not present

## 2024-02-24 DIAGNOSIS — B965 Pseudomonas (aeruginosa) (mallei) (pseudomallei) as the cause of diseases classified elsewhere: Secondary | ICD-10-CM | POA: Diagnosis not present

## 2024-02-24 DIAGNOSIS — B964 Proteus (mirabilis) (morganii) as the cause of diseases classified elsewhere: Secondary | ICD-10-CM | POA: Diagnosis not present

## 2024-02-24 DIAGNOSIS — N3081 Other cystitis with hematuria: Secondary | ICD-10-CM | POA: Diagnosis not present

## 2024-02-24 DIAGNOSIS — M793 Panniculitis, unspecified: Secondary | ICD-10-CM | POA: Diagnosis not present

## 2024-02-24 DIAGNOSIS — Z48817 Encounter for surgical aftercare following surgery on the skin and subcutaneous tissue: Secondary | ICD-10-CM | POA: Diagnosis not present

## 2024-02-24 DIAGNOSIS — L89152 Pressure ulcer of sacral region, stage 2: Secondary | ICD-10-CM | POA: Diagnosis not present

## 2024-03-01 DIAGNOSIS — L89152 Pressure ulcer of sacral region, stage 2: Secondary | ICD-10-CM | POA: Diagnosis not present

## 2024-03-01 DIAGNOSIS — B964 Proteus (mirabilis) (morganii) as the cause of diseases classified elsewhere: Secondary | ICD-10-CM | POA: Diagnosis not present

## 2024-03-01 DIAGNOSIS — M793 Panniculitis, unspecified: Secondary | ICD-10-CM | POA: Diagnosis not present

## 2024-03-01 DIAGNOSIS — B965 Pseudomonas (aeruginosa) (mallei) (pseudomallei) as the cause of diseases classified elsewhere: Secondary | ICD-10-CM | POA: Diagnosis not present

## 2024-03-01 DIAGNOSIS — N3081 Other cystitis with hematuria: Secondary | ICD-10-CM | POA: Diagnosis not present

## 2024-03-01 DIAGNOSIS — Z48817 Encounter for surgical aftercare following surgery on the skin and subcutaneous tissue: Secondary | ICD-10-CM | POA: Diagnosis not present

## 2024-03-02 DIAGNOSIS — N3081 Other cystitis with hematuria: Secondary | ICD-10-CM | POA: Diagnosis not present

## 2024-03-02 DIAGNOSIS — M793 Panniculitis, unspecified: Secondary | ICD-10-CM | POA: Diagnosis not present

## 2024-03-02 DIAGNOSIS — L89152 Pressure ulcer of sacral region, stage 2: Secondary | ICD-10-CM | POA: Diagnosis not present

## 2024-03-02 DIAGNOSIS — B965 Pseudomonas (aeruginosa) (mallei) (pseudomallei) as the cause of diseases classified elsewhere: Secondary | ICD-10-CM | POA: Diagnosis not present

## 2024-03-02 DIAGNOSIS — B964 Proteus (mirabilis) (morganii) as the cause of diseases classified elsewhere: Secondary | ICD-10-CM | POA: Diagnosis not present

## 2024-03-02 DIAGNOSIS — Z48817 Encounter for surgical aftercare following surgery on the skin and subcutaneous tissue: Secondary | ICD-10-CM | POA: Diagnosis not present

## 2024-03-03 DIAGNOSIS — L89152 Pressure ulcer of sacral region, stage 2: Secondary | ICD-10-CM | POA: Diagnosis not present

## 2024-03-03 DIAGNOSIS — N3081 Other cystitis with hematuria: Secondary | ICD-10-CM | POA: Diagnosis not present

## 2024-03-03 DIAGNOSIS — B965 Pseudomonas (aeruginosa) (mallei) (pseudomallei) as the cause of diseases classified elsewhere: Secondary | ICD-10-CM | POA: Diagnosis not present

## 2024-03-03 DIAGNOSIS — M793 Panniculitis, unspecified: Secondary | ICD-10-CM | POA: Diagnosis not present

## 2024-03-03 DIAGNOSIS — B964 Proteus (mirabilis) (morganii) as the cause of diseases classified elsewhere: Secondary | ICD-10-CM | POA: Diagnosis not present

## 2024-03-03 DIAGNOSIS — Z48817 Encounter for surgical aftercare following surgery on the skin and subcutaneous tissue: Secondary | ICD-10-CM | POA: Diagnosis not present

## 2024-03-13 ENCOUNTER — Encounter (HOSPITAL_COMMUNITY): Payer: Self-pay

## 2024-03-13 ENCOUNTER — Other Ambulatory Visit: Payer: Self-pay

## 2024-03-13 ENCOUNTER — Inpatient Hospital Stay (HOSPITAL_COMMUNITY): Admission: EM | Admit: 2024-03-13 | Discharge: 2024-03-20 | DRG: 871 | Disposition: A

## 2024-03-13 DIAGNOSIS — N200 Calculus of kidney: Secondary | ICD-10-CM | POA: Diagnosis not present

## 2024-03-13 DIAGNOSIS — Z1152 Encounter for screening for COVID-19: Secondary | ICD-10-CM | POA: Diagnosis not present

## 2024-03-13 DIAGNOSIS — I517 Cardiomegaly: Secondary | ICD-10-CM | POA: Diagnosis not present

## 2024-03-13 DIAGNOSIS — R310 Gross hematuria: Secondary | ICD-10-CM

## 2024-03-13 DIAGNOSIS — N3091 Cystitis, unspecified with hematuria: Secondary | ICD-10-CM | POA: Diagnosis present

## 2024-03-13 DIAGNOSIS — R4182 Altered mental status, unspecified: Secondary | ICD-10-CM | POA: Diagnosis not present

## 2024-03-13 DIAGNOSIS — Z7901 Long term (current) use of anticoagulants: Secondary | ICD-10-CM | POA: Diagnosis not present

## 2024-03-13 DIAGNOSIS — Z452 Encounter for adjustment and management of vascular access device: Secondary | ICD-10-CM | POA: Diagnosis not present

## 2024-03-13 DIAGNOSIS — E662 Morbid (severe) obesity with alveolar hypoventilation: Secondary | ICD-10-CM | POA: Diagnosis present

## 2024-03-13 DIAGNOSIS — R339 Retention of urine, unspecified: Secondary | ICD-10-CM

## 2024-03-13 DIAGNOSIS — R5381 Other malaise: Secondary | ICD-10-CM | POA: Diagnosis present

## 2024-03-13 DIAGNOSIS — E871 Hypo-osmolality and hyponatremia: Secondary | ICD-10-CM | POA: Diagnosis not present

## 2024-03-13 DIAGNOSIS — R000 Tachycardia, unspecified: Secondary | ICD-10-CM | POA: Diagnosis not present

## 2024-03-13 DIAGNOSIS — D62 Acute posthemorrhagic anemia: Secondary | ICD-10-CM | POA: Diagnosis present

## 2024-03-13 DIAGNOSIS — R9431 Abnormal electrocardiogram [ECG] [EKG]: Secondary | ICD-10-CM | POA: Diagnosis not present

## 2024-03-13 DIAGNOSIS — Z23 Encounter for immunization: Secondary | ICD-10-CM | POA: Diagnosis present

## 2024-03-13 DIAGNOSIS — G4733 Obstructive sleep apnea (adult) (pediatric): Secondary | ICD-10-CM | POA: Diagnosis not present

## 2024-03-13 DIAGNOSIS — N179 Acute kidney failure, unspecified: Secondary | ICD-10-CM | POA: Diagnosis present

## 2024-03-13 DIAGNOSIS — D631 Anemia in chronic kidney disease: Secondary | ICD-10-CM | POA: Diagnosis not present

## 2024-03-13 DIAGNOSIS — Z6841 Body Mass Index (BMI) 40.0 and over, adult: Secondary | ICD-10-CM | POA: Diagnosis not present

## 2024-03-13 DIAGNOSIS — J9611 Chronic respiratory failure with hypoxia: Secondary | ICD-10-CM | POA: Diagnosis present

## 2024-03-13 DIAGNOSIS — T300 Burn of unspecified body region, unspecified degree: Secondary | ICD-10-CM | POA: Diagnosis not present

## 2024-03-13 DIAGNOSIS — I10 Essential (primary) hypertension: Secondary | ICD-10-CM | POA: Diagnosis present

## 2024-03-13 DIAGNOSIS — R319 Hematuria, unspecified: Secondary | ICD-10-CM | POA: Diagnosis present

## 2024-03-13 DIAGNOSIS — I272 Pulmonary hypertension, unspecified: Secondary | ICD-10-CM | POA: Diagnosis present

## 2024-03-13 DIAGNOSIS — R58 Hemorrhage, not elsewhere classified: Secondary | ICD-10-CM | POA: Diagnosis not present

## 2024-03-13 DIAGNOSIS — J90 Pleural effusion, not elsewhere classified: Secondary | ICD-10-CM | POA: Diagnosis present

## 2024-03-13 DIAGNOSIS — E559 Vitamin D deficiency, unspecified: Secondary | ICD-10-CM | POA: Diagnosis present

## 2024-03-13 DIAGNOSIS — A419 Sepsis, unspecified organism: Secondary | ICD-10-CM | POA: Diagnosis present

## 2024-03-13 DIAGNOSIS — K7460 Unspecified cirrhosis of liver: Secondary | ICD-10-CM | POA: Diagnosis present

## 2024-03-13 DIAGNOSIS — I712 Thoracic aortic aneurysm, without rupture, unspecified: Secondary | ICD-10-CM | POA: Diagnosis present

## 2024-03-13 DIAGNOSIS — E66813 Obesity, class 3: Secondary | ICD-10-CM | POA: Diagnosis present

## 2024-03-13 DIAGNOSIS — E872 Acidosis, unspecified: Secondary | ICD-10-CM | POA: Diagnosis present

## 2024-03-13 DIAGNOSIS — N3000 Acute cystitis without hematuria: Secondary | ICD-10-CM | POA: Diagnosis present

## 2024-03-13 DIAGNOSIS — I2510 Atherosclerotic heart disease of native coronary artery without angina pectoris: Secondary | ICD-10-CM | POA: Diagnosis not present

## 2024-03-13 DIAGNOSIS — F0390 Unspecified dementia without behavioral disturbance: Secondary | ICD-10-CM | POA: Diagnosis present

## 2024-03-13 DIAGNOSIS — R29818 Other symptoms and signs involving the nervous system: Secondary | ICD-10-CM | POA: Diagnosis not present

## 2024-03-13 DIAGNOSIS — G9341 Metabolic encephalopathy: Secondary | ICD-10-CM | POA: Diagnosis present

## 2024-03-13 DIAGNOSIS — I959 Hypotension, unspecified: Secondary | ICD-10-CM | POA: Insufficient documentation

## 2024-03-13 DIAGNOSIS — D509 Iron deficiency anemia, unspecified: Secondary | ICD-10-CM | POA: Diagnosis not present

## 2024-03-13 DIAGNOSIS — N281 Cyst of kidney, acquired: Secondary | ICD-10-CM | POA: Diagnosis not present

## 2024-03-13 DIAGNOSIS — E785 Hyperlipidemia, unspecified: Secondary | ICD-10-CM | POA: Diagnosis present

## 2024-03-13 DIAGNOSIS — N136 Pyonephrosis: Secondary | ICD-10-CM | POA: Diagnosis present

## 2024-03-13 DIAGNOSIS — R6521 Severe sepsis with septic shock: Secondary | ICD-10-CM | POA: Diagnosis present

## 2024-03-13 DIAGNOSIS — R7881 Bacteremia: Secondary | ICD-10-CM | POA: Diagnosis not present

## 2024-03-13 DIAGNOSIS — I350 Nonrheumatic aortic (valve) stenosis: Secondary | ICD-10-CM | POA: Diagnosis present

## 2024-03-13 DIAGNOSIS — J45909 Unspecified asthma, uncomplicated: Secondary | ICD-10-CM | POA: Diagnosis present

## 2024-03-13 DIAGNOSIS — N1330 Unspecified hydronephrosis: Secondary | ICD-10-CM | POA: Diagnosis not present

## 2024-03-13 DIAGNOSIS — J9610 Chronic respiratory failure, unspecified whether with hypoxia or hypercapnia: Secondary | ICD-10-CM | POA: Diagnosis not present

## 2024-03-13 DIAGNOSIS — I4719 Other supraventricular tachycardia: Secondary | ICD-10-CM | POA: Diagnosis present

## 2024-03-13 DIAGNOSIS — D649 Anemia, unspecified: Secondary | ICD-10-CM | POA: Diagnosis not present

## 2024-03-13 DIAGNOSIS — R578 Other shock: Secondary | ICD-10-CM | POA: Diagnosis not present

## 2024-03-13 DIAGNOSIS — I501 Left ventricular failure: Secondary | ICD-10-CM | POA: Diagnosis not present

## 2024-03-13 DIAGNOSIS — E8729 Other acidosis: Secondary | ICD-10-CM | POA: Diagnosis not present

## 2024-03-13 DIAGNOSIS — N390 Urinary tract infection, site not specified: Secondary | ICD-10-CM | POA: Diagnosis not present

## 2024-03-13 DIAGNOSIS — I6523 Occlusion and stenosis of bilateral carotid arteries: Secondary | ICD-10-CM | POA: Diagnosis not present

## 2024-03-13 DIAGNOSIS — R7303 Prediabetes: Secondary | ICD-10-CM | POA: Diagnosis present

## 2024-03-13 MED ORDER — SODIUM CHLORIDE 0.9 % IV SOLN
2.0000 g | Freq: Once | INTRAVENOUS | Status: AC
  Administered 2024-03-14: 2 g via INTRAVENOUS
  Filled 2024-03-13: qty 20

## 2024-03-13 MED ORDER — MORPHINE SULFATE (PF) 4 MG/ML IV SOLN
4.0000 mg | Freq: Once | INTRAVENOUS | Status: AC
  Administered 2024-03-13: 4 mg via INTRAVENOUS
  Filled 2024-03-13: qty 1

## 2024-03-13 MED ORDER — SODIUM CHLORIDE 0.9 % IV BOLUS
1000.0000 mL | Freq: Once | INTRAVENOUS | Status: AC
  Administered 2024-03-14: 1000 mL via INTRAVENOUS

## 2024-03-13 MED ORDER — CHLORHEXIDINE GLUCONATE CLOTH 2 % EX PADS
6.0000 | MEDICATED_PAD | Freq: Every day | CUTANEOUS | Status: DC
  Administered 2024-03-14 – 2024-03-20 (×6): 6 via TOPICAL

## 2024-03-13 MED ORDER — ONDANSETRON 4 MG PO TBDP
8.0000 mg | ORAL_TABLET | Freq: Once | ORAL | Status: AC
  Administered 2024-03-13: 8 mg via ORAL
  Filled 2024-03-13: qty 2

## 2024-03-13 MED ORDER — LIDOCAINE HCL URETHRAL/MUCOSAL 2 % EX GEL
1.0000 | Freq: Once | CUTANEOUS | Status: DC
  Filled 2024-03-13: qty 22

## 2024-03-13 NOTE — ED Provider Notes (Addendum)
 [place] EMERGENCY DEPARTMENT AT [facility] Provider Note   CSN: [id number] Arrival date & time: 03/13/24  2027     Patient presents with: Penile Discharge (Pain and bleeding out of penis per patient due to constipation.)   Curtis Hill is a 78 y.o. male.   Pt is a 78 yo male with pmhx significant for GERD, prostate cancer, morbid obesity, and sleep apnea.  Pt is nonambulatory due to severe obesity.  He was admitted in August with hemorrhagic/emphysematous cystitis.  When he was admitted, Dr. Elisabeth (urology) had to place a catheter over a wire with urethral dilation.  He's had some dysuria for the past few days.  His home health aide noticed some pink tinge to his urine this am.  When she came back to change him this evening, he had frank blood.  He feels like he needs to urinate, but there is a plug in his penis and he can't. He denies fevers.          Prior to Admission medications   Medication Sig Start Date End Date Taking? Authorizing Provider  albuterol  (VENTOLIN  HFA) 108 (90 Base) MCG/ACT inhaler Inhale 2 puffs into the lungs every 6 (six) hours as needed for wheezing or shortness of breath.    [provider]  allopurinol  (ZYLOPRIM ) 300 MG tablet Take 300 mg by mouth daily.    [provider]  Amino Acids-Protein Hydrolys (FEEDING SUPPLEMENT, PRO-STAT SUGAR FREE 64,) LIQD Take 30 mLs by mouth in the morning and at bedtime.    [provider]  ammonium lactate (LAC-HYDRIN) 12 % lotion Apply 1 Application topically 2 (two) times daily as needed for dry skin. Apply to bilateral legs and feet two times a day for dry, irritated skin. Rub in well.    [provider]  ascorbic acid (VITAMIN C) 500 MG tablet Take 500 mg by mouth 2 (two) times daily.    [provider]  aspirin  EC 81 MG tablet Take 81 mg by mouth daily. Swallow whole.    [provider]  atorvastatin  (LIPITOR) 20 MG tablet Take 20 mg by mouth every  evening.    [provider]  calcium  carbonate (TUMS - DOSED IN MG ELEMENTAL CALCIUM ) 500 MG chewable tablet Chew 1 tablet by mouth 2 (two) times daily. Give for calcium  supplementation    [provider]  cholecalciferol (VITAMIN D3) 25 MCG (1000 UNIT) tablet Take 1,000 Units by mouth daily.    [provider]  Coenzyme Q10 (COQ10) 200 MG CAPS Take 1 capsule by mouth in the morning.    [provider]  cyanocobalamin (VITAMIN B12) 500 MCG tablet Take 1,000 mcg by mouth daily. 09/09/17   [provider]  furosemide  (LASIX ) 40 MG tablet Take 40 mg by mouth daily as needed for edema or fluid.    [provider]  gabapentin  (NEURONTIN ) 100 MG capsule Take 100 mg by mouth 3 (three) times daily. Give one capsule three times daily for nerve pain .    [provider]  magnesium  oxide (MAG-OX) 400 (240 Mg) MG tablet Take 400 mg by mouth daily.    [provider]  metoprolol  tartrate (LOPRESSOR ) 25 MG tablet Take 1 tablet (25 mg total) by mouth 2 (two) times daily. 12/10/23   Dennise Lavada POUR, MD  Multiple Vitamins-Minerals (CENTRUM SILVER 50+MEN) TABS Take 1 tablet by mouth in the morning.    [provider]  Nystatin  (GERHARDT'S BUTT CREAM) CREA Apply  1 Application topically 3 (three) times daily. Patient not taking: Reported on 11/29/2023 10/25/23   Lue Elsie BROCKS, MD  nystatin  (MYCOSTATIN /NYSTOP ) powder Apply topically 3 (three) times daily. Patient taking differently: Apply 1 Application topically 3 (three) times daily. Apply to groin topically three times daily. 09/13/23   Wouk, Devaughn Sayres, MD  polyethylene glycol (MIRALAX  / GLYCOLAX ) 17 g packet Take 17 g by mouth daily. 12/09/23   Singh, Prashant K, MD  Propyl Glycol-Hydroxyethylcell (NASAL MOIST) GEL Place 1 Units into both nostrils daily. Give for dryness    [provider]  Semaglutide-Weight Management 0.25 MG/0.5ML SOAJ Inject 0.25 mg into the skin once a  week. Inject every Wednesday. 10/13/23   [provider]  senna-docusate (SENOKOT-S) 8.6-50 MG tablet Take 1 tablet by mouth at bedtime as needed for mild constipation. 12/09/23   Dennise Lavada POUR, MD  simethicone  (MYLICON) 80 MG chewable tablet Chew 160 mg by mouth 3 (three) times daily after meals. Give for GI distress    [provider]  traMADol  (ULTRAM ) 50 MG tablet Take 1 tablet (50 mg total) by mouth every 6 (six) hours as needed for moderate pain (pain score 4-6) or severe pain (pain score 7-10). 12/09/23   Dennise Lavada POUR, MD    Allergies: Levofloxacin , Lisinopril, and Prednisone     Review of Systems  Genitourinary:  Positive for hematuria.  All other systems reviewed and are negative.   Updated Vital Signs BP (!) 141/110   Pulse (!) 114   Temp 98.2 F (36.8 C) (Oral)   Resp (!) 25   Ht 5' 2 (1.575 m)   Wt (!) 175.4 kg   SpO2 100%   BMI 70.73 kg/m   Physical Exam Vitals and nursing note reviewed.  Constitutional:      Appearance: Normal appearance. He is obese.  HENT:     Head: Normocephalic and atraumatic.     Right Ear: External ear normal.     Left Ear: External ear normal.     Nose: Nose normal.     Mouth/Throat:     Mouth: Mucous membranes are dry.  Eyes:     Extraocular Movements: Extraocular movements intact.     Conjunctiva/sclera: Conjunctivae normal.     Pupils: Pupils are equal, round, and reactive to light.  Cardiovascular:     Rate and Rhythm: Regular rhythm. Tachycardia present.     Pulses: Normal pulses.     Heart sounds: Normal heart sounds.  Pulmonary:     Effort: Pulmonary effort is normal.     Breath sounds: Normal breath sounds.  Abdominal:     General: Bowel sounds are normal.     Palpations: Abdomen is soft.     Comments: Pt is very obese with a large pannus  Genitourinary:    Comments: Frank blood noted in diaper.   Penis is retracted into excess skin Musculoskeletal:        General: Normal range of motion.      Cervical back: Normal range of motion and neck supple.  Skin:    General: Skin is warm.     Capillary Refill: Capillary refill takes less than 2 seconds.  Neurological:     Mental Status: He is alert. Mental status is at baseline.  Psychiatric:        Mood and Affect: Mood normal.        Behavior: Behavior normal.     (all labs ordered are listed, but only abnormal results are displayed) Labs Reviewed  I-STAT CG4 LACTIC ACID, ED - Abnormal; Notable for the following components:      Result Value   Lactic Acid, Venous 4.5 (*)    All other components within normal limits  CULTURE, BLOOD (ROUTINE X 2)  CULTURE, BLOOD (ROUTINE X 2)  RESP PANEL BY RT-PCR (RSV, FLU A&B, COVID)  RVPGX2  BASIC METABOLIC PANEL WITH GFR  CBC WITH DIFFERENTIAL/PLATELET  URINALYSIS, W/ REFLEX TO CULTURE (INFECTION SUSPECTED)  PROTIME-INR  HEPATIC FUNCTION PANEL  I-STAT CHEM 8, ED  I-STAT CG4 LACTIC ACID, ED    EKG: None  Radiology: DG Chest Port 1 View Result Date: 03/14/2024 CLINICAL DATA:  Constipation and bleeding out of the penis. EXAM: PORTABLE CHEST 1 VIEW COMPARISON:  October 22, 2023 FINDINGS: The cardiac silhouette is mildly enlarged and unchanged in size. Low lung volumes are noted. No acute infiltrate, pleural effusion or pneumothorax is identified. The visualized skeletal structures are unremarkable. IMPRESSION: Low lung volumes without acute or active cardiopulmonary disease. Electronically Signed   By: Suzen Dials M.D.   On: 03/14/2024 00:37     Central Line  Date/Time: 03/14/2024 1:23 AM  Performed by: Dean Clarity, MD Authorized by: Dean Clarity, MD   Consent:    Consent obtained:  Verbal   Consent given by:  Patient Universal protocol:    Patient identity confirmed:  Verbally with patient Pre-procedure details:    Indication(s): central venous access and insufficient peripheral access     Hand hygiene: Hand hygiene performed prior to insertion     Sterile barrier  technique: All elements of maximal sterile technique followed     Skin preparation:  Chlorhexidine    Skin preparation agent: Skin preparation agent completely dried prior to procedure   Sedation:    Sedation type:  None Procedure details:    Location:  R internal jugular   Patient position:  Trendelenburg   Procedural supplies:  Double lumen   Catheter size:  7 Fr   Landmarks identified: yes     Ultrasound guidance: yes     Ultrasound guidance timing: real time     Sterile ultrasound techniques: Sterile gel and sterile probe covers were used     Number of attempts:  2   Successful placement: yes   Post-procedure details:    Post-procedure:  Dressing applied and line sutured   Assessment:  Blood return through all ports and free fluid flow   Procedure completion:  Tolerated well, no immediate complications    Medications Ordered in the ED  Chlorhexidine  Gluconate Cloth 2 % PADS 6 each (0 each Topical Hold 03/13/24 2216)  lidocaine  (XYLOCAINE ) 2 % jelly 1 Application (has no administration in time range)  morphine  (PF) 4 MG/ML injection 4 mg (4 mg Intravenous Given 03/13/24 2214)  ondansetron  (ZOFRAN -ODT) disintegrating tablet 8 mg (8 mg Oral Given 03/13/24 2213)  sodium chloride  0.9 % bolus 1,000 mL (1,000 mLs Intravenous New Bag/Given 03/14/24 0012)  cefTRIAXone  (ROCEPHIN ) 2 g in sodium chloride  0.9 % 100 mL IVPB (2 g Intravenous New Bag/Given 03/14/24 0014)                                    Medical Decision Making Amount and/or Complexity of Data Reviewed Labs: ordered. Radiology: ordered.  Risk OTC drugs. Prescription drug management.   This patient presents to the ED for concern of hematuria, this involves an extensive number of treatment options, and is a complaint  that carries with it a high risk of complications and morbidity.  The differential diagnosis includes hemorrhagic cystitis, sepsis, uti   Co morbidities that complicate the patient evaluation  GERD,  prostate cancer, morbid obesity, and sleep apnea   Additional history obtained:  Additional history obtained from epic chart review External records from outside source obtained and reviewed including EMS report   Lab Tests:  I Ordered, and personally interpreted labs.  The pertinent results include:  lactic elevated at 4.5; other labs pending at shift change.   Imaging Studies ordered:  I ordered imaging studies including ct abd/pelvis  Pending at shift change   Cardiac Monitoring:  The patient was maintained on a cardiac monitor.  I personally viewed and interpreted the cardiac monitored which showed an underlying rhythm of: st   Medicines ordered and prescription drug management:  I ordered medication including ivfs/rocephin   for sepsis  Reevaluation of the patient after these medicines showed that the patient improved I have reviewed the patients home medicines and have made adjustments as needed   Test Considered:  ct   Critical Interventions:  abx   Consultations Obtained:  I requested consultation with the urologist (Dr. Norva),  and discussed lab and imaging findings as well as pertinent plan - she will see pt   Problem List / ED Course:  Hematuria causing urinary retention:  bedside US  shows an enlarged bladder.  Nurses have attempted to place a foley, but were unsuccessful.  However, they were able to dislodge a clot and pt was able to urinate.  Urology has been consulted as pt will likely need a foley with irrigation and we are unable to place. Sepsis:  likely sepsis from uti.  Code sepsis called and ivfs/abx given.  Pt had poor peripheral access, so I started a right internal jugular.  Labs are pending at shift change.  Pt signed out to Dr. Jerral.   Reevaluation:  After the interventions noted above, I reevaluated the patient and found that they have :stayed the same   Social Determinants of Health:  Lives at home   Dispostion:  After  consideration of the diagnostic results and the patients response to treatment, I feel that the patent would benefit from admission.    CRITICAL CARE Performed by: Mliss Boyers   Total critical care time: 45 minutes  Critical care time was exclusive of separately billable procedures and treating other patients.  Critical care was necessary to treat or prevent imminent or life-threatening deterioration.  Critical care was time spent personally by me on the following activities: development of treatment plan with patient and/or surrogate as well as nursing, discussions with consultants, evaluation of patient's response to treatment, examination of patient, obtaining history from patient or surrogate, ordering and performing treatments and interventions, ordering and review of laboratory studies, ordering and review of radiographic studies, pulse oximetry and re-evaluation of patient's condition.      Final diagnoses:  Morbid obesity (HCC)  Gross hematuria  Urinary retention  Sepsis, due to unspecified organism, unspecified whether acute organ dysfunction present [facility])    ED Discharge Orders     None          Boyers Mliss, MD 03/14/24 [id number]    Boyers Mliss, MD 03/14/24 [phone]

## 2024-03-13 NOTE — ED Triage Notes (Signed)
 Pain and bleeding out of penis per patient due to constipation. Pt had large bm yesterday and has a history of prostate cancer.

## 2024-03-13 NOTE — ED Provider Notes (Incomplete)
 [place] EMERGENCY DEPARTMENT AT [facility] Provider Note   CSN: [id number] Arrival date & time: 03/13/24  2027     Patient presents with: Penile Discharge (Pain and bleeding out of penis per patient due to constipation.)   Curtis Hill is a 78 y.o. male.  {Add pertinent medical, surgical, social history, OB history to HPI:32947} Pt is a 78 yo male with pmhx significant for GERD, prostate cancer, morbid obesity, and sleep apnea.  Pt is nonambulatory due to severe obesity.  He was admitted in August with hemorrhagic/emphysematous cystitis.  When he was admitted, Dr. Elisabeth (urology) had to place a catheter over a wire with urethral dilation.  He's had some dysuria for the past few days.  His home health aide noticed some pink tinge to his urine this am.  When she came back to change him this evening, he had frank blood.  He feels like he needs to urinate, but there is a plug in his penis and he can't. He denies fevers.          Prior to Admission medications   Medication Sig Start Date End Date Taking? Authorizing Provider  albuterol  (VENTOLIN  HFA) 108 (90 Base) MCG/ACT inhaler Inhale 2 puffs into the lungs every 6 (six) hours as needed for wheezing or shortness of breath.    [provider]  allopurinol  (ZYLOPRIM ) 300 MG tablet Take 300 mg by mouth daily.    [provider]  Amino Acids-Protein Hydrolys (FEEDING SUPPLEMENT, PRO-STAT SUGAR FREE 64,) LIQD Take 30 mLs by mouth in the morning and at bedtime.    [provider]  ammonium lactate (LAC-HYDRIN) 12 % lotion Apply 1 Application topically 2 (two) times daily as needed for dry skin. Apply to bilateral legs and feet two times a day for dry, irritated skin. Rub in well.    [provider]  ascorbic acid (VITAMIN C) 500 MG tablet Take 500 mg by mouth 2 (two) times daily.    [provider]  aspirin  EC 81 MG tablet Take 81 mg by mouth daily. Swallow whole.    [provider]  atorvastatin  (LIPITOR) 20 MG tablet Take 20 mg by mouth every evening.    [provider]  calcium  carbonate (TUMS - DOSED IN MG ELEMENTAL CALCIUM ) 500 MG chewable tablet Chew 1 tablet by mouth 2 (two) times daily. Give for calcium  supplementation    [provider]  cholecalciferol (VITAMIN D3) 25 MCG (1000 UNIT) tablet Take 1,000 Units by mouth daily.    [provider]  Coenzyme Q10 (COQ10) 200 MG CAPS Take 1 capsule by mouth in the morning.    [provider]  cyanocobalamin (VITAMIN B12) 500 MCG tablet Take 1,000 mcg by mouth daily. 09/09/17   [provider]  furosemide  (LASIX ) 40 MG tablet Take 40 mg by mouth daily as needed for edema or fluid.    [provider]  gabapentin  (NEURONTIN ) 100 MG capsule Take 100 mg by mouth 3 (three) times daily. Give one capsule three times daily for nerve pain .    [provider]  magnesium  oxide (MAG-OX) 400 (240 Mg) MG tablet Take 400 mg by mouth daily.    [provider]  metoprolol  tartrate (LOPRESSOR ) 25 MG tablet Take 1 tablet (25 mg total) by mouth 2 (two) times daily. 12/10/23   Dennise Lavada POUR, MD  Multiple Vitamins-Minerals (CENTRUM SILVER 50+MEN) TABS Take 1 tablet by mouth in the morning.    [provider]  Nystatin  (GERHARDT'S BUTT CREAM) CREA Apply 1 Application topically 3 (three) times daily. Patient not taking: Reported on 11/29/2023 10/25/23   Lue Elsie BROCKS, MD  nystatin  (MYCOSTATIN /NYSTOP ) powder Apply topically 3 (three) times daily. Patient taking differently: Apply 1 Application topically 3 (three) times daily. Apply to groin topically three times daily. 09/13/23   Wouk, Devaughn Sayres, MD  polyethylene glycol (MIRALAX  / GLYCOLAX ) 17 g packet Take 17 g by mouth daily. 12/09/23   Singh, Prashant K, MD  Propyl Glycol-Hydroxyethylcell (NASAL MOIST) GEL Place 1 Units into both nostrils daily. Give for dryness    [provider]  Semaglutide-Weight  Management 0.25 MG/0.5ML SOAJ Inject 0.25 mg into the skin once a week. Inject every Wednesday. 10/13/23   [provider]  senna-docusate (SENOKOT-S) 8.6-50 MG tablet Take 1 tablet by mouth at bedtime as needed for mild constipation. 12/09/23   Dennise Lavada POUR, MD  simethicone  (MYLICON) 80 MG chewable tablet Chew 160 mg by mouth 3 (three) times daily after meals. Give for GI distress    [provider]  traMADol  (ULTRAM ) 50 MG tablet Take 1 tablet (50 mg total) by mouth every 6 (six) hours as needed for moderate pain (pain score 4-6) or severe pain (pain score 7-10). 12/09/23   Dennise Lavada POUR, MD    Allergies: Levofloxacin , Lisinopril, and Prednisone     Review of Systems  Genitourinary:  Positive for hematuria.  All other systems reviewed and are negative.   Updated Vital Signs BP (!) 141/110   Pulse (!) 114   Temp 98.2 F (36.8 C) (Oral)   Resp (!) 25   Ht 5' 2 (1.575 m)   Wt (!) 175.4 kg   SpO2 100%   BMI 70.73 kg/m   Physical Exam Vitals and nursing note reviewed.  Constitutional:      Appearance: Normal appearance. He is obese.  HENT:     Head: Normocephalic and atraumatic.     Right Ear: External ear normal.     Left Ear: External ear normal.     Nose: Nose normal.     Mouth/Throat:     Mouth: Mucous membranes are dry.  Eyes:     Extraocular Movements: Extraocular movements intact.     Conjunctiva/sclera: Conjunctivae normal.     Pupils: Pupils are equal, round, and reactive to light.  Cardiovascular:     Rate and Rhythm: Regular rhythm. Tachycardia present.     Pulses: Normal pulses.     Heart sounds: Normal heart sounds.  Pulmonary:     Effort: Pulmonary effort is normal.     Breath sounds: Normal breath sounds.  Abdominal:     General: Bowel sounds are normal.     Palpations: Abdomen is soft.     Comments: Pt is very obese with a large pannus  Genitourinary:    Comments: Frank blood noted in diaper.   Penis is retracted into excess  skin Musculoskeletal:        General: Normal range of motion.     Cervical back: Normal range of motion and neck supple.  Skin:    General: Skin is warm.     Capillary Refill: Capillary refill takes less than 2 seconds.  Neurological:     Mental Status: He is alert. Mental status is at baseline.  Psychiatric:        Mood and Affect: Mood normal.        Behavior: Behavior normal.     (all labs ordered are  listed, but only abnormal results are displayed) Labs Reviewed  CULTURE, BLOOD (ROUTINE X 2)  CULTURE, BLOOD (ROUTINE X 2)  BASIC METABOLIC PANEL WITH GFR  CBC WITH DIFFERENTIAL/PLATELET  URINALYSIS, W/ REFLEX TO CULTURE (INFECTION SUSPECTED)  I-STAT CG4 LACTIC ACID, ED  I-STAT CG4 LACTIC ACID, ED    EKG: None  Radiology: No results found.  {Document cardiac monitor, telemetry assessment procedure when appropriate:32947} Procedures   Medications Ordered in the ED  Chlorhexidine  Gluconate Cloth 2 % PADS 6 each (0 each Topical Hold 03/13/24 2216)  lidocaine  (XYLOCAINE ) 2 % jelly 1 Application (has no administration in time range)  sodium chloride  0.9 % bolus 1,000 mL (has no administration in time range)  cefTRIAXone  (ROCEPHIN ) 2 g in sodium chloride  0.9 % 100 mL IVPB (has no administration in time range)  morphine  (PF) 4 MG/ML injection 4 mg (4 mg Intravenous Given 03/13/24 2214)  ondansetron  (ZOFRAN -ODT) disintegrating tablet 8 mg (8 mg Oral Given 03/13/24 2213)      {Click here for ABCD2, HEART and other calculators REFRESH Note before signing:1}                              Medical Decision Making Amount and/or Complexity of Data Reviewed Labs: ordered. Radiology: ordered.  Risk OTC drugs. Prescription drug management.   This patient presents to the ED for concern of hematuria, this involves an extensive number of treatment options, and is a complaint that carries with it a high risk of complications and morbidity.  The differential diagnosis includes  hemorrhagic cystitis, sepsis, uti   Co morbidities that complicate the patient evaluation  GERD, prostate cancer, morbid obesity, and sleep apnea   Additional history obtained:  Additional history obtained from epic chart review External records from outside source obtained and reviewed including EMS report   Lab Tests:  I Ordered, and personally interpreted labs.  The pertinent results include:  ***   Imaging Studies ordered:  I ordered imaging studies including ***  I independently visualized and interpreted imaging which showed *** I agree with the radiologist interpretation   Cardiac Monitoring:  The patient was maintained on a cardiac monitor.  I personally viewed and interpreted the cardiac monitored which showed an underlying rhythm of: ***   Medicines ordered and prescription drug management:  I ordered medication including ***  for ***  Reevaluation of the patient after these medicines showed that the patient {resolved/improved/worsened:23923::improved} I have reviewed the patients home medicines and have made adjustments as needed   Test Considered:  ***   Critical Interventions:  ***   Consultations Obtained:  I requested consultation with the ***,  and discussed lab and imaging findings as well as pertinent plan - they recommend: ***   Problem List / ED Course:  ***   Reevaluation:  After the interventions noted above, I reevaluated the patient and found that they have :{resolved/improved/worsened:23923::improved}   Social Determinants of Health:  ***   Dispostion:  After consideration of the diagnostic results and the patients response to treatment, I feel that the patent would benefit from ***.    {Document critical care time when appropriate  Document review of labs and clinical decision tools ie [id number], etc  Document your independent review of radiology images and any outside records  Document your discussion with  family members, caretakers and with consultants  Document social determinants of health affecting pt's care  Document your decision making why or why  not admission, treatments were needed:32947:::1}   Final diagnoses:  None    ED Discharge Orders     None

## 2024-03-13 NOTE — ED Notes (Signed)
 Called OR to get urology cart

## 2024-03-13 NOTE — Sepsis Progress Note (Signed)
 Elink monitoring for the code sepsis protocol.

## 2024-03-13 NOTE — ED Notes (Signed)
 Was only able obtain 1 BC,could not obtain the rest of the labs RN notified.

## 2024-03-14 ENCOUNTER — Emergency Department (HOSPITAL_COMMUNITY)

## 2024-03-14 ENCOUNTER — Inpatient Hospital Stay (HOSPITAL_COMMUNITY)

## 2024-03-14 DIAGNOSIS — N390 Urinary tract infection, site not specified: Secondary | ICD-10-CM

## 2024-03-14 DIAGNOSIS — N200 Calculus of kidney: Secondary | ICD-10-CM | POA: Diagnosis not present

## 2024-03-14 DIAGNOSIS — A419 Sepsis, unspecified organism: Secondary | ICD-10-CM | POA: Diagnosis present

## 2024-03-14 DIAGNOSIS — Z23 Encounter for immunization: Secondary | ICD-10-CM | POA: Diagnosis not present

## 2024-03-14 DIAGNOSIS — N3000 Acute cystitis without hematuria: Secondary | ICD-10-CM | POA: Diagnosis present

## 2024-03-14 DIAGNOSIS — N179 Acute kidney failure, unspecified: Secondary | ICD-10-CM | POA: Diagnosis present

## 2024-03-14 DIAGNOSIS — I272 Pulmonary hypertension, unspecified: Secondary | ICD-10-CM | POA: Diagnosis present

## 2024-03-14 DIAGNOSIS — I959 Hypotension, unspecified: Secondary | ICD-10-CM | POA: Insufficient documentation

## 2024-03-14 DIAGNOSIS — R6521 Severe sepsis with septic shock: Secondary | ICD-10-CM | POA: Diagnosis present

## 2024-03-14 LAB — BASIC METABOLIC PANEL WITH GFR
Anion gap: 11 (ref 5–15)
Anion gap: 11 (ref 5–15)
BUN: 32 mg/dL — ABNORMAL HIGH (ref 8–23)
BUN: 37 mg/dL — ABNORMAL HIGH (ref 8–23)
CO2: 28 mmol/L (ref 22–32)
CO2: 27 mmol/L (ref 22–32)
Calcium: 8.7 mg/dL — ABNORMAL LOW (ref 8.9–10.3)
Calcium: 8.9 mg/dL (ref 8.9–10.3)
Chloride: 100 mmol/L (ref 98–111)
Chloride: 99 mmol/L (ref 98–111)
Creatinine, Ser: 1.45 mg/dL — ABNORMAL HIGH (ref 0.61–1.24)
Creatinine, Ser: 1.79 mg/dL — ABNORMAL HIGH (ref 0.61–1.24)
GFR, Estimated: 50 mL/min — ABNORMAL LOW (ref >60)
GFR, Estimated: 39 mL/min — ABNORMAL LOW (ref >60)
Glucose, Bld: 108 mg/dL — ABNORMAL HIGH (ref 70–99)
Glucose, Bld: 109 mg/dL — ABNORMAL HIGH (ref 70–99)
Potassium: 4.3 mmol/L (ref 3.5–5.1)
Potassium: 4.9 mmol/L (ref 3.5–5.1)
Sodium: 139 mmol/L (ref 135–145)
Sodium: 137 mmol/L (ref 135–145)

## 2024-03-14 LAB — CBC WITH DIFFERENTIAL/PLATELET
Abs Immature Granulocytes: 0.14 K/uL — ABNORMAL HIGH (ref 0.00–0.07)
Basophils Absolute: 0.1 K/uL (ref 0.0–0.1)
Basophils Relative: 0 %
Eosinophils Absolute: 0 K/uL (ref 0.0–0.5)
Eosinophils Relative: 0 %
HCT: 33.3 % — ABNORMAL LOW (ref 39.0–52.0)
Hemoglobin: 9.8 g/dL — ABNORMAL LOW (ref 13.0–17.0)
Immature Granulocytes: 1 %
Lymphocytes Relative: 4 %
Lymphs Abs: 1 K/uL (ref 0.7–4.0)
MCH: 28.1 pg (ref 26.0–34.0)
MCHC: 29.4 g/dL — ABNORMAL LOW (ref 30.0–36.0)
MCV: 95.4 fL (ref 80.0–100.0)
Monocytes Absolute: 1.2 K/uL — ABNORMAL HIGH (ref 0.1–1.0)
Monocytes Relative: 5 %
Neutro Abs: 22.4 K/uL — ABNORMAL HIGH (ref 1.7–7.7)
Neutrophils Relative %: 90 %
Platelets: 428 K/uL — ABNORMAL HIGH (ref 150–400)
RBC: 3.49 MIL/uL — ABNORMAL LOW (ref 4.22–5.81)
RDW: 19.3 % — ABNORMAL HIGH (ref 11.5–15.5)
WBC: 24.8 K/uL — ABNORMAL HIGH (ref 4.0–10.5)
nRBC: 0 % (ref 0.0–0.2)

## 2024-03-14 LAB — BLOOD CULTURE ID PANEL (REFLEXED) - BCID2

## 2024-03-14 LAB — CBC
HCT: 33.6 % — ABNORMAL LOW (ref 39.0–52.0)
Hemoglobin: 10.1 g/dL — ABNORMAL LOW (ref 13.0–17.0)
MCH: 28.3 pg (ref 26.0–34.0)
MCHC: 30.1 g/dL (ref 30.0–36.0)
MCV: 94.1 fL (ref 80.0–100.0)
Platelets: 425 K/uL — ABNORMAL HIGH (ref 150–400)
RBC: 3.57 MIL/uL — ABNORMAL LOW (ref 4.22–5.81)
RDW: 19.5 % — ABNORMAL HIGH (ref 11.5–15.5)
WBC: 37.7 K/uL — ABNORMAL HIGH (ref 4.0–10.5)
nRBC: 0 % (ref 0.0–0.2)

## 2024-03-14 LAB — I-STAT CHEM 8, ED
BUN: 34 mg/dL — ABNORMAL HIGH (ref 8–23)
BUN: 48 mg/dL — ABNORMAL HIGH (ref 8–23)
Calcium, Ion: 1.1 mmol/L — ABNORMAL LOW (ref 1.15–1.40)
Calcium, Ion: 1.13 mmol/L — ABNORMAL LOW (ref 1.15–1.40)
Chloride: 101 mmol/L (ref 98–111)
Chloride: 101 mmol/L (ref 98–111)
Creatinine, Ser: 1.5 mg/dL — ABNORMAL HIGH (ref 0.61–1.24)
Creatinine, Ser: 1.5 mg/dL — ABNORMAL HIGH (ref 0.61–1.24)
Glucose, Bld: 101 mg/dL — ABNORMAL HIGH (ref 70–99)
Glucose, Bld: 104 mg/dL — ABNORMAL HIGH (ref 70–99)
HCT: 31 % — ABNORMAL LOW (ref 39.0–52.0)
HCT: 36 % — ABNORMAL LOW (ref 39.0–52.0)
Hemoglobin: 10.5 g/dL — ABNORMAL LOW (ref 13.0–17.0)
Hemoglobin: 12.2 g/dL — ABNORMAL LOW (ref 13.0–17.0)
Potassium: 4.3 mmol/L (ref 3.5–5.1)
Potassium: 5.8 mmol/L — ABNORMAL HIGH (ref 3.5–5.1)
Sodium: 137 mmol/L (ref 135–145)
Sodium: 140 mmol/L (ref 135–145)
TCO2: 26 mmol/L (ref 22–32)
TCO2: 29 mmol/L (ref 22–32)

## 2024-03-14 LAB — GLUCOSE, CAPILLARY
Glucose-Capillary: 115 mg/dL — ABNORMAL HIGH (ref 70–99)
Glucose-Capillary: 169 mg/dL — ABNORMAL HIGH (ref 70–99)
Glucose-Capillary: 145 mg/dL — ABNORMAL HIGH (ref 70–99)
Glucose-Capillary: 153 mg/dL — ABNORMAL HIGH (ref 70–99)
Glucose-Capillary: 138 mg/dL — ABNORMAL HIGH (ref 70–99)

## 2024-03-14 LAB — I-STAT CG4 LACTIC ACID, ED
Lactic Acid, Venous: 2.1 mmol/L (ref 0.5–1.9)
Lactic Acid, Venous: 4.5 mmol/L (ref 0.5–1.9)

## 2024-03-14 LAB — RESP PANEL BY RT-PCR (RSV, FLU A&B, COVID)  RVPGX2
Influenza A by PCR: NEGATIVE
Influenza B by PCR: NEGATIVE
Resp Syncytial Virus by PCR: NEGATIVE
SARS Coronavirus 2 by RT PCR: NEGATIVE

## 2024-03-14 LAB — APTT: aPTT: 33 s (ref 24–36)

## 2024-03-14 LAB — BRAIN NATRIURETIC PEPTIDE: B Natriuretic Peptide: 161.9 pg/mL — ABNORMAL HIGH (ref 0.0–100.0)

## 2024-03-14 LAB — PHOSPHORUS: Phosphorus: 3.1 mg/dL (ref 2.5–4.6)

## 2024-03-14 LAB — MAGNESIUM
Magnesium: 1.4 mg/dL — ABNORMAL LOW (ref 1.7–2.4)
Magnesium: 1.3 mg/dL — ABNORMAL LOW (ref 1.7–2.4)

## 2024-03-14 LAB — HEPATIC FUNCTION PANEL
ALT: 13 U/L (ref 0–44)
AST: 20 U/L (ref 15–41)
Albumin: 2.1 g/dL — ABNORMAL LOW (ref 3.5–5.0)
Alkaline Phosphatase: 74 U/L (ref 38–126)
Bilirubin, Direct: 0.1 mg/dL (ref 0.0–0.2)
Indirect Bilirubin: 0.5 mg/dL (ref 0.3–0.9)
Total Bilirubin: 0.6 mg/dL (ref 0.0–1.2)
Total Protein: 6.9 g/dL (ref 6.5–8.1)

## 2024-03-14 LAB — MRSA NEXT GEN BY PCR, NASAL: MRSA by PCR Next Gen: NOT DETECTED

## 2024-03-14 LAB — LACTIC ACID, PLASMA
Lactic Acid, Venous: 2.2 mmol/L (ref 0.5–1.9)
Lactic Acid, Venous: 2.4 mmol/L (ref 0.5–1.9)
Lactic Acid, Venous: 1.7 mmol/L (ref 0.5–1.9)
Lactic Acid, Venous: 1.9 mmol/L (ref 0.5–1.9)

## 2024-03-14 LAB — PROTIME-INR
INR: 1 (ref 0.8–1.2)
Prothrombin Time: 13.5 s (ref 11.4–15.2)

## 2024-03-14 LAB — HEMOGLOBIN A1C
Hgb A1c MFr Bld: 5.1 % (ref 4.8–5.6)
Mean Plasma Glucose: 100 mg/dL

## 2024-03-14 MED ORDER — HYDRALAZINE HCL 20 MG/ML IJ SOLN: 10.0000 mg | INTRAMUSCULAR | Status: DC | PRN

## 2024-03-14 MED ORDER — ONDANSETRON HCL 4 MG/2ML IJ SOLN: 4.0000 mg | Freq: Four times a day (QID) | INTRAMUSCULAR | Status: DC | PRN

## 2024-03-14 MED ORDER — PANTOPRAZOLE SODIUM 40 MG IV SOLR: 40.0000 mg | INTRAVENOUS | Status: DC

## 2024-03-14 MED ORDER — ACETAMINOPHEN 325 MG PO TABS
650.0000 mg | ORAL_TABLET | Freq: Four times a day (QID) | ORAL | Status: DC | PRN
  Administered 2024-03-16 – 2024-03-17 (×2): 650 mg via ORAL
  Filled 2024-03-14 (×2): qty 2

## 2024-03-14 MED ORDER — POLYETHYLENE GLYCOL 3350 17 G PO PACK
17.0000 g | PACK | Freq: Every day | ORAL | Status: DC
  Administered 2024-03-15 – 2024-03-20 (×6): 17 g via ORAL
  Filled 2024-03-14 (×6): qty 1

## 2024-03-14 MED ORDER — SODIUM CHLORIDE 0.9% FLUSH
10.0000 mL | Freq: Two times a day (BID) | INTRAVENOUS | Status: DC
  Administered 2024-03-14 – 2024-03-20 (×11): 10 mL

## 2024-03-14 MED ORDER — SODIUM CHLORIDE 0.9% FLUSH: 10.0000 mL | INTRAVENOUS | Status: DC | PRN

## 2024-03-14 MED ORDER — VANCOMYCIN HCL IN DEXTROSE 1-5 GM/200ML-% IV SOLN
1000.0000 mg | INTRAVENOUS | Status: DC
  Administered 2024-03-15: 1000 mg via INTRAVENOUS
  Filled 2024-03-14: qty 200

## 2024-03-14 MED ORDER — POLYETHYLENE GLYCOL 3350 17 G PO PACK: 17.0000 g | PACK | Freq: Every day | ORAL | Status: DC | PRN

## 2024-03-14 MED ORDER — ACETAMINOPHEN 650 MG RE SUPP: 650.0000 mg | Freq: Four times a day (QID) | RECTAL | Status: DC | PRN

## 2024-03-14 MED ORDER — SENNOSIDES-DOCUSATE SODIUM 8.6-50 MG PO TABS
1.0000 | ORAL_TABLET | Freq: Every day | ORAL | Status: DC
  Administered 2024-03-14 – 2024-03-15 (×2): 1 via ORAL
  Filled 2024-03-14 (×2): qty 1

## 2024-03-14 MED ORDER — PANTOPRAZOLE SODIUM 40 MG PO TBEC
40.0000 mg | DELAYED_RELEASE_TABLET | Freq: Every day | ORAL | Status: DC
  Administered 2024-03-14 – 2024-03-15 (×2): 40 mg via ORAL
  Filled 2024-03-14 (×2): qty 1

## 2024-03-14 MED ORDER — LACTATED RINGERS IV SOLN: INTRAVENOUS | Status: DC

## 2024-03-14 MED ORDER — SENNOSIDES-DOCUSATE SODIUM 8.6-50 MG PO TABS: 1.0000 | ORAL_TABLET | Freq: Every evening | ORAL | Status: DC | PRN

## 2024-03-14 MED ORDER — ACETAMINOPHEN 325 MG PO TABS
650.0000 mg | ORAL_TABLET | Freq: Four times a day (QID) | ORAL | Status: DC | PRN
  Filled 2024-03-14: qty 2

## 2024-03-14 MED ORDER — SODIUM CHLORIDE 0.9% FLUSH
3.0000 mL | Freq: Two times a day (BID) | INTRAVENOUS | Status: DC
  Administered 2024-03-14 – 2024-03-19 (×7): 3 mL via INTRAVENOUS

## 2024-03-14 MED ORDER — VANCOMYCIN HCL 2000 MG/400ML IV SOLN
2000.0000 mg | Freq: Once | INTRAVENOUS | Status: AC
  Administered 2024-03-14: 2000 mg via INTRAVENOUS
  Filled 2024-03-14: qty 400

## 2024-03-14 MED ORDER — NOREPINEPHRINE 4 MG/250ML-% IV SOLN
0.0000 ug/min | INTRAVENOUS | Status: DC
  Administered 2024-03-14: 2 ug/min via INTRAVENOUS
  Administered 2024-03-14: 14 ug/min via INTRAVENOUS
  Administered 2024-03-14 (×2): 12 ug/min via INTRAVENOUS
  Administered 2024-03-15: 13 ug/min via INTRAVENOUS
  Filled 2024-03-14 (×6): qty 250

## 2024-03-14 MED ORDER — ATORVASTATIN CALCIUM 10 MG PO TABS
20.0000 mg | ORAL_TABLET | Freq: Every evening | ORAL | Status: DC
  Administered 2024-03-14 – 2024-03-19 (×5): 20 mg via ORAL
  Filled 2024-03-14 (×6): qty 2

## 2024-03-14 MED ORDER — OXYCODONE HCL 5 MG PO TABS
5.0000 mg | ORAL_TABLET | ORAL | Status: DC | PRN
  Administered 2024-03-14 – 2024-03-19 (×12): 5 mg via ORAL
  Filled 2024-03-14 (×12): qty 1

## 2024-03-14 MED ORDER — ACETAMINOPHEN 650 MG RE SUPP
650.0000 mg | Freq: Four times a day (QID) | RECTAL | Status: DC | PRN
  Administered 2024-03-14: 650 mg via RECTAL
  Filled 2024-03-14: qty 1

## 2024-03-14 MED ORDER — SODIUM CHLORIDE 0.9 % IV SOLN: 1.0000 g | INTRAVENOUS | Status: DC

## 2024-03-14 MED ORDER — MAGNESIUM SULFATE 2 GM/50ML IV SOLN
2.0000 g | Freq: Once | INTRAVENOUS | Status: AC
  Administered 2024-03-14: 2 g via INTRAVENOUS
  Filled 2024-03-14: qty 50

## 2024-03-14 MED ORDER — IPRATROPIUM BROMIDE 0.02 % IN SOLN: 0.5000 mg | Freq: Four times a day (QID) | RESPIRATORY_TRACT | Status: DC | PRN

## 2024-03-14 MED ORDER — LACTATED RINGERS IV BOLUS (SEPSIS)
1000.0000 mL | Freq: Once | INTRAVENOUS | Status: AC
  Administered 2024-03-14: 1000 mL via INTRAVENOUS

## 2024-03-14 MED ORDER — SODIUM CHLORIDE 0.9 % IV SOLN: 250.0000 mL | INTRAVENOUS | Status: AC

## 2024-03-14 MED ORDER — VANCOMYCIN VARIABLE DOSE PER UNSTABLE RENAL FUNCTION (PHARMACIST DOSING): Status: DC

## 2024-03-14 MED ORDER — HEPARIN SODIUM (PORCINE) 5000 UNIT/ML IJ SOLN: 5000.0000 [IU] | Freq: Three times a day (TID) | INTRAMUSCULAR | Status: DC

## 2024-03-14 MED ORDER — SODIUM CHLORIDE 0.9% FLUSH
3.0000 mL | Freq: Two times a day (BID) | INTRAVENOUS | Status: DC
  Administered 2024-03-14 – 2024-03-16 (×5): 3 mL via INTRAVENOUS

## 2024-03-14 MED ORDER — LACTATED RINGERS IV SOLN
150.0000 mL/h | INTRAVENOUS | Status: AC
  Administered 2024-03-14: 150 mL/h via INTRAVENOUS

## 2024-03-14 MED ORDER — IOHEXOL 350 MG/ML SOLN
100.0000 mL | Freq: Once | INTRAVENOUS | Status: AC | PRN
  Administered 2024-03-14: 100 mL via INTRAVENOUS

## 2024-03-14 MED ORDER — SODIUM CHLORIDE 0.9 % IV SOLN
2.0000 g | Freq: Two times a day (BID) | INTRAVENOUS | Status: DC
  Administered 2024-03-14 – 2024-03-20 (×13): 2 g via INTRAVENOUS
  Filled 2024-03-14 (×14): qty 12.5

## 2024-03-14 MED ORDER — ORAL CARE MOUTH RINSE: 15.0000 mL | OROMUCOSAL | Status: DC | PRN

## 2024-03-14 MED ORDER — TRAZODONE HCL 50 MG PO TABS
25.0000 mg | ORAL_TABLET | Freq: Every evening | ORAL | Status: DC | PRN
  Administered 2024-03-16 – 2024-03-17 (×2): 25 mg via ORAL
  Filled 2024-03-14 (×2): qty 1

## 2024-03-14 MED ORDER — DOCUSATE SODIUM 100 MG PO CAPS: 100.0000 mg | ORAL_CAPSULE | Freq: Two times a day (BID) | ORAL | Status: DC | PRN

## 2024-03-14 MED ORDER — FLEET ENEMA RE ENEM
1.0000 | ENEMA | Freq: Once | RECTAL | Status: AC | PRN
  Administered 2024-03-19: 1 via RECTAL
  Filled 2024-03-14: qty 1

## 2024-03-14 MED ORDER — INSULIN ASPART 100 UNIT/ML IJ SOLN
0.0000 [IU] | INTRAMUSCULAR | Status: DC
  Administered 2024-03-14 (×2): 2 [IU] via SUBCUTANEOUS
  Administered 2024-03-14 – 2024-03-15 (×5): 1 [IU] via SUBCUTANEOUS
  Administered 2024-03-15: 2 [IU] via SUBCUTANEOUS
  Filled 2024-03-14: qty 2
  Filled 2024-03-14: qty 1
  Filled 2024-03-14: qty 2
  Filled 2024-03-14: qty 1
  Filled 2024-03-14: qty 4
  Filled 2024-03-14 (×3): qty 1

## 2024-03-14 MED ORDER — FENTANYL CITRATE (PF) 50 MCG/ML IJ SOSY: 50.0000 ug | PREFILLED_SYRINGE | INTRAMUSCULAR | Status: DC | PRN

## 2024-03-14 MED ORDER — ONDANSETRON HCL 4 MG PO TABS: 4.0000 mg | ORAL_TABLET | Freq: Four times a day (QID) | ORAL | Status: DC | PRN

## 2024-03-14 MED ORDER — LACTATED RINGERS IV BOLUS (SEPSIS)
800.0000 mL | Freq: Once | INTRAVENOUS | Status: AC
  Administered 2024-03-14: 800 mL via INTRAVENOUS

## 2024-03-14 NOTE — Progress Notes (Signed)
  Carryover admission to the Day Admitter.  I discussed this case with the EDP, Dr. Jerral.  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  Noted to be febrile.  CT abdomen/pelvis showed evidence of acute cystitis.  Bladder scan was reported to reveal 1.5 L of urine.  Attempts at placement of Foley catheter were not unsuccessful in the setting of obstruction.   EDP has d/w on-call urology, Dr. Norva, who will consult and will see pt at [facility] to address the bladder outlet obstruction. Foley cart at bedside.  Started on Rocephin  for acute cystitis.  I have placed an order for observation for further evaluation and management of the above.  I have placed some additional preliminary admit orders via the adult multi-morbid admission order set. I have also ordered continuation of Rocephin .  I have added on a magnesium  level to recently collected labs.    Eva Pore, DO Hospitalist

## 2024-03-14 NOTE — Assessment & Plan Note (Signed)
 Denies of having any chest pain -Home medication reviewed, continue accordingly

## 2024-03-14 NOTE — Assessment & Plan Note (Signed)
 But holding BP meds due to hypotension secondary to sepsis

## 2024-03-14 NOTE — Assessment & Plan Note (Signed)
 Super morbid obesity with BMI greater than 70-with pannus Discussed with patient, may need future evaluation by PCP for aggressive weight management-
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
## 2024-03-14 NOTE — ED Notes (Signed)
 Pt in bed, pt moaning, pt reports some penile pain, pt foley is draining pink liquid, pt awake and oriented times three, pt states that he wants to sit up, repositioned pt.

## 2024-03-14 NOTE — Assessment & Plan Note (Signed)
 Monitoring closely, needing IV fluid for sepsis -Currently requiring 2 L of oxygen  satting 97% -Will avoid volume overload

## 2024-03-14 NOTE — Hospital Course (Addendum)
 78 y.o. 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  Urology consulted.

## 2024-03-14 NOTE — Assessment & Plan Note (Signed)
 Last A1c 5.6 Monitoring CBG as needed

## 2024-03-14 NOTE — Assessment & Plan Note (Signed)
 Due to bladder cancer, obstructive uropathy, cystitis meeting sepsis Foley catheter placed by urology -Withholding chemical DVT prophylaxis, chronic compression stocking, SCDs
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
## 2024-03-14 NOTE — Assessment & Plan Note (Signed)
 Severe chronic debility ambulates with assist only at home -When stable, consulting PT OT for evaluation -Patient would likely need home health PT OT on discharge

## 2024-03-14 NOTE — TOC Initial Note (Signed)
 Transition of Care [facility]) - Initial/Assessment Note    Patient Details  Name: Curtis Hill MRN: [id number] Date of Birth: May 22, 1945  [facility] CM/SW Contact:    Lauraine FORBES Saa, LCSWA Phone Number: 03/14/2024, 3:55 PM  Clinical Narrative:                  3:55 PM CSW introduced self and role to patient. Patient informed CSW that he resides at home with spouse and son but needs assistance with transportation to appointments. Patient accepted CSW offer of SDOH (transportation) resources. CSW provided transportation resources. Patient informed CSW that he currently received HHPT/OT/Aide services but was unsure of [facility] agency name. Per chart review, patient has HH history with [facility]. Patient confirmed SNF history (per chart review, has SNF history with [facility] and [address]). Patient confirmed DME (oxygen , CPAP, wheelchair, RW) history (per chart review, has DME history with [employer] and [facility]). Patient confirmed insurance coverage. Per chart review, patient has a PCP and preferred pharmacy's [facility] [phone] [place], [facility] [place]). No TOC needs identified at this time. TOC will continue to follow.  Expected Discharge Plan: Skilled Nursing Facility Barriers to Discharge: Continued Medical Work up   Patient Goals and CMS Choice            Expected Discharge Plan and Services       Living arrangements for the past 2 months: Single Family Home                                      Prior Living Arrangements/Services Living arrangements for the past 2 months: Single Family Home Lives with:: Spouse, Adult Children Patient language and need for interpreter reviewed:: Yes        Need for Family Participation in Patient Care: No (Comment) Care giver support system in place?: Yes (comment) Current home services: DME, Homehealth aide, Home OT, Home PT Criminal Activity/Legal Involvement Pertinent to  Current Situation/Hospitalization: No - Comment as needed  Activities of Daily Living      Permission Sought/Granted Permission sought to share information with : Family Supports Permission granted to share information with : No (Contact information on chart)  Share Information with NAME: Plummer Matich     Permission granted to share info w Relationship: Spouse  Permission granted to share info w Contact Information: [phone]  Emotional Assessment Appearance:: Appears stated age Attitude/Demeanor/Rapport: Engaged Affect (typically observed): Accepting, Appropriate, Adaptable, Calm, Stable, Pleasant Orientation: : Oriented to Self, Oriented to Place, Oriented to Situation, Oriented to  Time Alcohol / Substance Use: Not Applicable Psych Involvement: No (comment)  Admission diagnosis:  Morbid obesity (HCC) [E66.01] Acute cystitis [N30.00] Urinary retention [R33.9] Gross hematuria [R31.0] Sepsis due to urinary tract infection (HCC) [A41.9, N39.0] Sepsis, due to unspecified organism, unspecified whether acute organ dysfunction present [facility]) [A41.9] Patient Active Problem List   Diagnosis Date Noted   Acute cystitis 03/14/2024   Sepsis due to urinary tract infection (HCC) 03/14/2024   Hypotensive episode 03/14/2024   Hematuria 11/29/2023   Hemorrhagic cystitis 11/29/2023   AKI (acute kidney injury) 09/04/2023   OSA (obstructive sleep apnea) 09/04/2023   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   Peripheral edema 12/07/2020   B12 deficiency 11/14/2020   Essential hypertension 08/15/2020   Visceral obesity 07/29/2020   Prediabetes 06/20/2020   Vitamin D deficiency 05/23/2020   Hyperglycemia 05/23/2020  Debility 05/23/2020   Nutrition impaired due to imbalance of nutrients 05/23/2020   Depression screening 05/23/2020   Thoracic ascending aortic aneurysm 11/22/2019   Panniculitis 10/31/2019   History of colonic polyps 02/27/2019   Pulmonary HTN  (HCC) 11/02/2018   CAD (coronary artery disease) 10/20/2018   Aortic stenosis 07/19/2018   Hyperlipidemia LDL goal <70 07/19/2018   Hyperlipidemia    Coronary artery calcification seen on CAT scan 03/17/2018   Benign essential HTN 03/17/2018   Thoracic aortic aneurysm (TAA) 03/17/2018   Benign neoplasm of colon 03/28/2012   BMI 70 and over, adult (HCC) 05/18/2007   ANEMIA, IRON DEFICIENCY 05/18/2007   CARDIOMEGALY 05/18/2007   Diaphragmatic hernia 05/18/2007   Abdominal pain 05/18/2007   EPIGASTRIC PAIN 05/18/2007   GOUT, HX OF 05/18/2007   PCP:  Okey Carlin Redbird, MD Pharmacy:   [facility] - [place], [place] - [address] [address] Phone: [phone] Fax: [phone]  [facility] [id number] [place], [place] - [address] [place] [address] Phone: [phone] Fax: [phone]     Social Drivers of Health (SDOH) Social History: SDOH Screenings   Food Insecurity: No Food Insecurity (03/14/2024)  Housing: Low Risk  (03/14/2024)  Transportation Needs: Unmet Transportation Needs (03/14/2024)  Utilities: Not At Risk (03/14/2024)  Depression (PHQ2-9): Medium Risk (05/23/2020)  Social Connections: Moderately Integrated (03/14/2024)  Tobacco Use: Low Risk  (03/13/2024)   SDOH Interventions: Transportation Interventions: Inpatient TOC, Community Resources Provided   Readmission Risk Interventions     No data to display

## 2024-03-14 NOTE — Assessment & Plan Note (Signed)
-   Secondary to obstructive uropathy urinary retention prostate cancer,  - continue antibiotics per sepsis protocol -Will follow the urine culture

## 2024-03-14 NOTE — Assessment & Plan Note (Signed)
 Blood pressure (!) 89/66, pulse (!) 128, temp. 99.1 F (37.3 C), RR 16, SpO2 97% on 2 L of oxygen   Lactic acid 4.5, >> 2.1, 2.2, WBC 24.8, 37.7,   BUN 37, Creatinine 1.79,  CT abdomen bladder revealed distended bladder, bilateral hydronephrosis, Foley catheter was placed in ED, Urologist was consulted -catheter was placed (difficulty catheterization due to large pannus Patient was seen by Dr. Elisabeth on previous admission s/p cystoscopy, balloon dilatation and catheter placement   Patient met severe sepsis criteria-admitted to progressive unit Per sepsis protocol, broad-spectrum antibiotics, IV fluids, initiated Central line is in place, patient started on Levophed   - Will follow-up with the urine/blood cultures -Patient was initially given Rocephin , broaden antibiotics to cefepime  -Monitoring hematuria, withholding chemical DVT prophylaxis - Will monitor closely in progressive unit

## 2024-03-14 NOTE — Progress Notes (Signed)
 PHARMACY - PHYSICIAN COMMUNICATION CRITICAL VALUE ALERT - BLOOD CULTURE IDENTIFICATION (BCID)  Curtis Hill is an 78 y.o. male who presented to [facility] on 03/13/2024 with a chief complaint of sepsis.  Assessment:   Sepsis work-up ongoing Cefepime  empirically added for hx of chronic cystitis  12/01 blood culture with 1 out of 3 growing strep species and staph epi with MecA gene detected    Name of physician (or Provider) Contacted: Dr. Pleas  Current antibiotics:  Cefepime  2g IV Q12h  Changes to prescribed antibiotics recommended:  Recommendations accepted by provider   Thank you for allowing pharmacy to be a part of this patient's care.  Shelba Collier, PharmD, BCPS Clinical Pharmacist

## 2024-03-14 NOTE — Progress Notes (Signed)
 Pharmacy Antibiotic Note  Curtis Hill is a 78 y.o. male admitted on 03/13/2024 with sepsis.  Pharmacy has been consulted for cefepime  dosing.  Plan: Cefepime  2g q12h.  Follow culture data for de-escalation.  Monitor renal function for dose adjustments as indicated.   Height: 5' 2 (157.5 cm) Weight: (!) 175.4 kg (386 lb 11 oz) IBW/kg (Calculated) : 54.6  Temp (24hrs), Avg:101.5 F (38.6 C), Min:98.2 F (36.8 C), Max:102.9 F (39.4 C)  Recent Labs  Lab 03/14/24 0012 03/14/24 0132 03/14/24 0135 03/14/24 0145 03/14/24 0152 03/14/24 0155 03/14/24 0657  WBC  --   --  24.8*  --   --   --  37.7*  CREATININE  --  1.50*  --  1.45*  --  1.50* 1.79*  LATICACIDVEN 4.5*  --   --   --  2.1*  --   --     Estimated Creatinine Clearance: 50.3 mL/min (A) (by C-G formula based on SCr of 1.79 mg/dL (H)).    Allergies  Allergen Reactions   Levofloxacin  Other (See Comments)    Aortic aneurysm   Lisinopril Anaphylaxis, Shortness Of Breath, Swelling and Rash   Prednisone  Swelling    Antimicrobials this admission: Cefepime  12/2 >>  Ceftriaxone  12/2 x1   Microbiology results: 12/2 BCx:  12/2 UCx:   12/2 Sputum:   12/2 MRSA PCR:   Thank you for allowing pharmacy to be a part of this patient's care.  Powell Blush, PharmD, BCCCP  03/14/2024 7:53 AM

## 2024-03-14 NOTE — Discharge Instructions (Signed)
 [facility]ABRA

## 2024-03-14 NOTE — Procedures (Signed)
   Procedure: flexible cysto bedside   HPI: Pt is a 77 yo male with PMH prostate cancer s/p external beam radiation and radioactive seed placement 12/11/2010 with recent admission for emphysematous cystitis and urinary retention who presented to [facility] ED with blood per urethra and inability to void with labs and vitals concerning for urosepsis. CT with distended bladder and mild-moderate BL hydronephrosis. Patient voided in the ED after passing a large clot. Urology was consulted for foley catheter placement given buried penis.   Urology Procedure Note: The patient was prepped and draped in the usual sterile fashion. Three assistants retracted the patient's pannus. Initial attempts at placement of a 20 Fr coude tip three-way catheter and 18 Fr council tip catheter were unsuccessful. The 17 Fr flexible Ambu cystoscope was then set up. This passed easily through the cicatrix and into the patient's urethral meatus. The urethra was patent and accommodated the scope without need for dilation. Upon entry into the bladder, the patient's urine appeared cloudy, but there was no active bleeding. There was no clot in the bladder. A hydrophilic glidewire was then passed  through the scope into the bladder and the scope was removed using Seldinger technique. An 18 fr council tip catheter was then passed over the wire and into the bladder. There was return of murky, slightly pink-tinged urine. The balloon was instilled with 10 cc sterile water and the catheter was attached to a drainage bag. There was immediate return of 800 cc UOP.   Recommendations: - Continue foley catheter for at least 7 days given urinary retention   - If discharged prior, please page Urology so outpatient follow-up can be arranged   - If patient remains admitted, please page Urology prior to removal to ensure adequate coverage should catheter need to be replaced  - Trend Cr - Obtain RUS after 48 hours to ensure resolution of hydronephrosis with  foley catheter in place - Defer treatment of urosepsis to primary team  Maurilio Agar

## 2024-03-14 NOTE — Assessment & Plan Note (Signed)
 Continue statins

## 2024-03-14 NOTE — ED Notes (Signed)
 Urology at bedside.

## 2024-03-14 NOTE — ED Notes (Addendum)
 Assumed care of patient. Patient noted to be tachycardiac in the 140's and febrile with temperature of 102.9. Patient unable to tolerate PO at this time. Unable to roll patient in stretcher, due to body habitus, to administer rectal tylenol . Ordered bariatric bed - will administer medication when bed arrives. Awaiting urology to place foley. Checked in with Dr. Jerral about urology arrival - new consult placed.
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
## 2024-03-14 NOTE — Assessment & Plan Note (Signed)
 Hypotensive due to sepsis, but holding home BP meds
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
## 2024-03-14 NOTE — ED Provider Notes (Signed)
 Care of patient received from prior provider at 12:22 AM, please see their note for complete H/P and care plan.  Reassessment: 78 year old male gradually decompensating throughout the course of the evening.  CT confirms cystitis with urinary retention, urology repaged with confirmation of diagnosis. Blood pressure continued to downtrend, heart rate continued to uptrend, patient required titration onto Levophed  for stabilization of blood pressure. Appropriately responding at this time.  Urology to decompress bladder due to failed traditional approaches. Critical care consulted for admission.  CRITICAL CARE Performed by: Cyndee Dada   Total critical care time: 90 minutes  Critical care time was exclusive of separately billable procedures and treating other patients.  Critical care was necessary to treat or prevent imminent or life-threatening deterioration.  Critical care was time spent personally by me on the following activities: development of treatment plan with patient and/or surrogate as well as nursing, discussions with consultants, evaluation of patient's response to treatment, examination of patient, obtaining history from patient or surrogate, ordering and performing treatments and interventions, ordering and review of laboratory studies, ordering and review of radiographic studies, pulse oximetry and re-evaluation of patient's condition.    Dada Cyndee, MD 03/14/24 [phone]

## 2024-03-14 NOTE — Assessment & Plan Note (Signed)
-   Hypotensive due to sepsis, will continue Levophed  with protocol Taper off -Continue aggressive IV fluid resuscitation per sepsis protocol -Appreciate PCCM close follow-up

## 2024-03-14 NOTE — Assessment & Plan Note (Signed)
 AKI due to sepsis, hypotension -Avoiding nephrotoxins, avoiding further hypotension with Levophed  Adjusting medication-renal dose

## 2024-03-14 NOTE — Assessment & Plan Note (Signed)
 Per patient not CPAP dependent, only O2 supplement nightly as needed

## 2024-03-14 NOTE — Sepsis Progress Note (Signed)
 Sepsis protocol monitored by eLink

## 2024-03-15 ENCOUNTER — Inpatient Hospital Stay (HOSPITAL_COMMUNITY)

## 2024-03-15 DIAGNOSIS — I6523 Occlusion and stenosis of bilateral carotid arteries: Secondary | ICD-10-CM | POA: Diagnosis not present

## 2024-03-15 DIAGNOSIS — R29818 Other symptoms and signs involving the nervous system: Secondary | ICD-10-CM | POA: Diagnosis not present

## 2024-03-15 LAB — COMPREHENSIVE METABOLIC PANEL WITH GFR
ALT: 13 U/L (ref 0–44)
AST: 17 U/L (ref 15–41)
Albumin: 1.8 g/dL — ABNORMAL LOW (ref 3.5–5.0)
Alkaline Phosphatase: 66 U/L (ref 38–126)
Anion gap: 11 (ref 5–15)
BUN: 33 mg/dL — ABNORMAL HIGH (ref 8–23)
CO2: 28 mmol/L (ref 22–32)
Calcium: 8.8 mg/dL — ABNORMAL LOW (ref 8.9–10.3)
Chloride: 96 mmol/L — ABNORMAL LOW (ref 98–111)
Creatinine, Ser: 1.66 mg/dL — ABNORMAL HIGH (ref 0.61–1.24)
GFR, Estimated: 42 mL/min — ABNORMAL LOW (ref >60)
Glucose, Bld: 142 mg/dL — ABNORMAL HIGH (ref 70–99)
Potassium: 4.3 mmol/L (ref 3.5–5.1)
Sodium: 135 mmol/L (ref 135–145)
Total Bilirubin: 0.5 mg/dL (ref 0.0–1.2)
Total Protein: 6.3 g/dL — ABNORMAL LOW (ref 6.5–8.1)

## 2024-03-15 LAB — ECHOCARDIOGRAM COMPLETE
AR max vel: 1.9 cm2
AV Area VTI: 1.67 cm2
AV Area mean vel: 2.04 cm2
AV Mean grad: 11 mmHg
AV Peak grad: 18.5 mmHg
Ao pk vel: 2.15 m/s
Height: 62 in
S' Lateral: 3.6 cm
Weight: 6186.99 [oz_av]

## 2024-03-15 LAB — CBC
HCT: 30 % — ABNORMAL LOW (ref 39.0–52.0)
Hemoglobin: 9.1 g/dL — ABNORMAL LOW (ref 13.0–17.0)
MCH: 28.3 pg (ref 26.0–34.0)
MCHC: 30.3 g/dL (ref 30.0–36.0)
MCV: 93.2 fL (ref 80.0–100.0)
Platelets: 409 K/uL — ABNORMAL HIGH (ref 150–400)
RBC: 3.22 MIL/uL — ABNORMAL LOW (ref 4.22–5.81)
RDW: 19.5 % — ABNORMAL HIGH (ref 11.5–15.5)
WBC: 34.5 K/uL — ABNORMAL HIGH (ref 4.0–10.5)
nRBC: 0 % (ref 0.0–0.2)

## 2024-03-15 LAB — URINALYSIS, W/ REFLEX TO CULTURE (INFECTION SUSPECTED)
Bilirubin Urine: NEGATIVE
Glucose, UA: NEGATIVE mg/dL
Ketones, ur: NEGATIVE mg/dL
Nitrite: NEGATIVE
Protein, ur: 100 mg/dL — AB
Specific Gravity, Urine: 1.02 (ref 1.005–1.030)
WBC, UA: >50 WBC/hpf (ref 0–5)
pH: 6 (ref 5.0–8.0)

## 2024-03-15 LAB — PHOSPHORUS: Phosphorus: 3.1 mg/dL (ref 2.5–4.6)

## 2024-03-15 LAB — ECHOCARDIOGRAM LIMITED
Height: 62 in
Weight: 6186.99 [oz_av]

## 2024-03-15 LAB — PROTIME-INR
INR: 1.3 — ABNORMAL HIGH (ref 0.8–1.2)
Prothrombin Time: 16.6 s — ABNORMAL HIGH (ref 11.4–15.2)

## 2024-03-15 LAB — GLUCOSE, CAPILLARY
Glucose-Capillary: 126 mg/dL — ABNORMAL HIGH (ref 70–99)
Glucose-Capillary: 134 mg/dL — ABNORMAL HIGH (ref 70–99)
Glucose-Capillary: 155 mg/dL — ABNORMAL HIGH (ref 70–99)
Glucose-Capillary: 119 mg/dL — ABNORMAL HIGH (ref 70–99)
Glucose-Capillary: 126 mg/dL — ABNORMAL HIGH (ref 70–99)
Glucose-Capillary: 98 mg/dL (ref 70–99)

## 2024-03-15 LAB — MAGNESIUM: Magnesium: 1.6 mg/dL — ABNORMAL LOW (ref 1.7–2.4)

## 2024-03-15 LAB — APTT: aPTT: 40 s — ABNORMAL HIGH (ref 24–36)

## 2024-03-15 MED ORDER — PERFLUTREN LIPID MICROSPHERE
1.0000 mL | INTRAVENOUS | Status: AC | PRN
  Administered 2024-03-15: 2 mL via INTRAVENOUS

## 2024-03-15 MED ORDER — HEPARIN SODIUM (PORCINE) 5000 UNIT/ML IJ SOLN
5000.0000 [IU] | Freq: Three times a day (TID) | INTRAMUSCULAR | Status: DC
  Administered 2024-03-15: 5000 [IU] via SUBCUTANEOUS
  Filled 2024-03-15: qty 1

## 2024-03-15 MED ORDER — PERFLUTREN LIPID MICROSPHERE
1.0000 mL | INTRAVENOUS | Status: AC | PRN
  Administered 2024-03-15: 4 mL via INTRAVENOUS

## 2024-03-15 MED ORDER — HEPARIN (PORCINE) 25000 UT/250ML-% IV SOLN
4000.0000 [IU]/h | INTRAVENOUS | Status: DC
  Administered 2024-03-15: 4000 [IU]/h via INTRAVENOUS

## 2024-03-15 MED ORDER — HEPARIN (PORCINE) 25000 UT/250ML-% IV SOLN
1600.0000 [IU]/h | INTRAVENOUS | Status: DC
  Administered 2024-03-15: 1600 [IU]/h via INTRAVENOUS
  Filled 2024-03-15: qty 250

## 2024-03-15 MED ORDER — MAGNESIUM SULFATE 4 GM/100ML IV SOLN
4.0000 g | Freq: Once | INTRAVENOUS | Status: AC
  Administered 2024-03-15: 4 g via INTRAVENOUS
  Filled 2024-03-15: qty 100

## 2024-03-15 MED ORDER — HEPARIN SODIUM (PORCINE) 5000 UNIT/ML IJ SOLN
5000.0000 [IU] | Freq: Three times a day (TID) | INTRAMUSCULAR | Status: DC
  Administered 2024-03-16 – 2024-03-20 (×13): 5000 [IU] via SUBCUTANEOUS
  Filled 2024-03-15 (×13): qty 1

## 2024-03-15 NOTE — Progress Notes (Signed)
 Heparin  gtt stopped by this RN as per verbal and written order by MD Soyla Merck, This Rn started the Heparin  gtt as per the order from Dr. Haze . As per the cardiology notes no definitive Thrombus and given patient had hematuria, no heparin  gtt unless necessary. Passed on to elink RN to MD. Will endorse to the oncoming shift as well.

## 2024-03-15 NOTE — Progress Notes (Signed)
 [facility] ADULT ICU REPLACEMENT PROTOCOL   The patient does apply for the [facility] Adult ICU Electrolyte Replacment Protocol based on the criteria listed below:   1.Exclusion criteria: TCTS, ECMO, Dialysis, and Myasthenia Gravis patients 2. Is GFR >/= 30 ml/min? Yes.    Patient's GFR today is 42 3. Is SCr </= 2? Yes.   Patient's SCr is 1.66 mg/dL 4. Did SCr increase >/= 0.5 in 24 hours? No. 5.Pt's weight >40kg  Yes.   6. Abnormal electrolyte(s): Mag 1.6  7. Electrolytes replaced per protocol 8.  Call MD STAT for K+ </= 2.5, Phos </= 1, or Mag </= 1 Physician:  Haze Ole BIRCH [facility] 03/15/2024 4:26 AM

## 2024-03-15 NOTE — Evaluation (Signed)
 Physical Therapy Evaluation Patient Details Name: Curtis Hill MRN: [id number] DOB: 02/07/1946 Today's Date: 03/15/2024  History of Present Illness  78 yo male presenting 12/2 with blood from urethra and inability to void. Pt being worked up for urosepsis and urinary retention. PMH: HTN, morbid obesity, OSA, thoracic aortic aneurysm, aortic stenosis, pulmonary hypertension, prostate CA s/p EBRT 2012   Clinical Impression  Pt admitted with above diagnosis. PTA pt lived at home with family, active with HHPT/OT. Pt primarily bedbound. Hoyer lift for transfers OOB. Pt currently with functional limitations due to the deficits listed below (see PT Problem List). On eval, pt required +2 max assist rolling, +2 max assist supine to sit, and +2 total assist sit to supine. He demo poor sitting balance, and poor sitting tolerance due to pain from pannus. Pt will benefit from acute skilled PT to increase their independence and safety with mobility to allow discharge. Post acute, recommend pt resume HHPT. Pt has all needed DME.         If plan is discharge home, recommend the following: Two people to help with walking and/or transfers;Two people to help with bathing/dressing/bathroom   Can travel by private vehicle        Equipment Recommendations None recommended by PT  Recommendations for Other Services       Functional Status Assessment Patient has had a recent decline in their functional status and/or demonstrates limited ability to make significant improvements in function in a reasonable and predictable amount of time     Precautions / Restrictions Precautions Precautions: Fall;Other (comment) Precaution/Restrictions Comments: large pannus      Mobility  Bed Mobility Overal bed mobility: Needs Assistance Bed Mobility: Supine to Sit, Sit to Supine, Rolling Rolling: +2 for physical assistance, Max assist   Supine to sit: +2 for physical assistance, Max assist, Used rails Sit to  supine: Total assist, +2 for physical assistance   General bed mobility comments: pt able to assist with transition to EOB using bedrails. Total assist for return to supine.    Transfers                   General transfer comment: unable to progress beyond EOB    Ambulation/Gait               General Gait Details: nonamb at baseline  Stairs            Wheelchair Mobility     Tilt Bed    Modified Rankin (Stroke Patients Only)       Balance Overall balance assessment: Needs assistance Sitting-balance support: Feet unsupported, Bilateral upper extremity supported Sitting balance-Leahy Scale: Poor   Postural control: Posterior lean                                   Pertinent Vitals/Pain Pain Assessment Pain Assessment: Faces Faces Pain Scale: Hurts even more Pain Location: pannus and back Pain Descriptors / Indicators: Grimacing, Sore Pain Intervention(s): Limited activity within patient's tolerance, Monitored during session, Repositioned, Premedicated before session    Home Living Family/patient expects to be discharged to:: Private residence Living Arrangements: Spouse/significant other;Children Available Help at Discharge: Family;Available 24 hours/day Type of Home: House Home Access: Stairs to enter;Ramped entrance (Flexsteps- steps convert to ramp) Entrance Stairs-Rails: Right;Left;Can reach both Entrance Stairs-Number of Steps: 4 Alternate Level Stairs-Number of Steps: flight Home Layout: One level (son lives in finished basement; pt and  his wife live on main floor) Home Equipment: [employer] (2 wheels);Lift chair;Wheelchair - power;Hospital bed;Other (comment) (hoyer lift) Additional Comments: Active with HHPT/OT at time of admission.    Prior Function Prior Level of Function : Needs assist             Mobility Comments: Primarily bedbound. Hoyer lift for transfers to lift chair vs w/c. Pt states neither chair is  comfortable for him so he does not tolerate well. Working on sitting EOB and standing with HHPT.       Extremity/Trunk Assessment   Upper Extremity Assessment Upper Extremity Assessment: Defer to OT evaluation    Lower Extremity Assessment Lower Extremity Assessment: Generalized weakness    Cervical / Trunk Assessment Cervical / Trunk Assessment: Other exceptions Cervical / Trunk Exceptions: body habitus  Communication   Communication Communication: Impaired Factors Affecting Communication: Difficulty expressing self    Cognition Arousal: Alert Behavior During Therapy: WFL for tasks assessed/performed   PT - Cognitive impairments: Problem solving                       PT - Cognition Comments: word finding difficulty Following commands: Intact       Cueing Cueing Techniques: Verbal cues     General Comments General comments (skin integrity, edema, etc.): HR into 130s during mobility. SpO2 maintained in 90s on RA.    Exercises     Assessment/Plan    PT Assessment Patient needs continued PT services  PT Problem List Decreased strength;Decreased activity tolerance;Decreased balance;Decreased mobility       PT Treatment Interventions Therapeutic exercise;Balance training;Functional mobility training;Therapeutic activities;Patient/family education    PT Goals (Current goals can be found in the Care Plan section)  Acute Rehab PT Goals Patient Stated Goal: not stated PT Goal Formulation: With patient Time For Goal Achievement: 03/29/24 Potential to Achieve Goals: Fair    Frequency Min 1X/week     Co-evaluation   Reason for Co-Treatment: For patient/therapist safety;To address functional/ADL transfers   OT goals addressed during session: ADL's and self-care       AM-PAC PT 6 Clicks Mobility  Outcome Measure Help needed turning from your back to your side while in a flat bed without using bedrails?: Total Help needed moving from lying on your  back to sitting on the side of a flat bed without using bedrails?: Total Help needed moving to and from a bed to a chair (including a wheelchair)?: Total Help needed standing up from a chair using your arms (e.g., wheelchair or bedside chair)?: Total Help needed to walk in hospital room?: Total Help needed climbing 3-5 steps with a railing? : Total 6 Click Score: 6    End of Session   Activity Tolerance: Patient tolerated treatment well Patient left: in bed;with call bell/phone within reach Nurse Communication: Mobility status;Need for lift equipment PT Visit Diagnosis: Other abnormalities of gait and mobility (R26.89);Muscle weakness (generalized) (M62.81)    Time: 0950-1006 PT Time Calculation (min) (ACUTE ONLY): 16 min   Charges:   PT Evaluation $PT Eval Moderate Complexity: 1 Mod   PT General Charges $$ ACUTE PT VISIT: 1 Visit         Sari MATSU., PT  Office # [phone]   Erven Sari Shaker 03/15/2024, 11:53 AM

## 2024-03-15 NOTE — Progress Notes (Addendum)
 PHARMACY - ANTICOAGULATION CONSULT NOTE  Pharmacy Consult for Heparin  Indication: LV apical thrombus UPDATE 2145 - Heparin  being discontinued by cardiology  Allergies  Allergen Reactions   Levofloxacin  Other (See Comments)    Aortic aneurysm   Lisinopril Anaphylaxis, Shortness Of Breath, Swelling and Rash   Prednisone  Swelling    Patient Measurements: Height: 5' 2 (157.5 cm) Weight: (!) 175.4 kg (386 lb 11 oz) IBW/kg (Calculated) : 54.6 HEPARIN  DW (KG): 100.4  Vital Signs: Temp: 97.8 F (36.6 C) (12/03 1545) Temp Source: Oral (12/03 1545) BP: 99/63 (12/03 1900) Pulse Rate: 115 (12/03 1900)  Labs: Recent Labs    03/13/24 2356 03/14/24 0132 03/14/24 0135 03/14/24 0145 03/14/24 0155 03/14/24 0657 03/14/24 0952 03/15/24 0242  HGB  --    < > 9.8*  --  10.5* 10.1*  --  9.1*  HCT  --    < > 33.3*  --  31.0* 33.6*  --  30.0*  PLT  --   --  428*  --   --  425*  --  409*  APTT  --   --   --   --   --   --  33 40*  LABPROT 13.5  --   --   --   --   --   --  16.6*  INR 1.0  --   --   --   --   --   --  1.3*  CREATININE  --    < >  --    < > 1.50* 1.79*  --  1.66*   < > = values in this interval not displayed.    Estimated Creatinine Clearance: 54.2 mL/min (A) (by C-G formula based on SCr of 1.66 mg/dL (H)).   Medical History: Past Medical History:  Diagnosis Date   ABDOMINAL PAIN 05/18/2007   Qualifier: Diagnosis of  By: Henrietta LPN, Regina     ANEMIA, IRON DEFICIENCY 05/18/2007   Qualifier: Diagnosis of  By: Henrietta LPN, Regina     Aortic stenosis 07/19/2018   Mild by echo 05/2018   Arthritis    Back pain    Benign neoplasm of colon 03/28/2012   Bilateral swelling of feet and ankles    Cancer (HCC)    prostate cancer   Chronic back pain    Coronary artery calcification seen on CAT scan 03/17/2018   EPIGASTRIC PAIN 05/18/2007   Qualifier: Diagnosis of  By: Henrietta LPN, Regina     Fatty liver    GERD (gastroesophageal reflux disease)    HIATAL HERNIA 05/18/2007   Qualifier:  Diagnosis of  By: Henrietta LPN, Regina     History of prostate cancer    Hyperlipidemia    Hypertension    Joint pain    LEUKOCYTOSIS 05/18/2007   Qualifier: Diagnosis of  By: Henrietta LPN, Regina     LIVER FUNCTION TESTS, ABNORMAL 05/18/2007   Qualifier: Diagnosis of  By: Henrietta LPN, Regina     MORBID OBESITY 05/18/2007   Qualifier: Diagnosis of  By: Henrietta LPN, Regina     Panniculitis    Pulmonary HTN (HCC) 11/02/2018   Shortness of breath on exertion    SINUSITIS, ACUTE 05/18/2007   Qualifier: Diagnosis of  By: Henrietta LPN, Regina     Skin rash    Sleep apnea    Thoracic aortic aneurysm 03/17/2018   45mm by echo 05/2018 and 43mm by chest CTA 03/2018   THROMBOCYTOSIS 05/18/2007   Qualifier: Diagnosis of  By:  Laws LPN, Angeline      Medications:  Medications Prior to Admission  Medication Sig Dispense Refill Last Dose/Taking   allopurinol  (ZYLOPRIM ) 300 MG tablet Take 300 mg by mouth daily.   03/13/2024   aspirin  EC 81 MG tablet Take 81 mg by mouth daily. Swallow whole.   03/13/2024   atorvastatin  (LIPITOR) 20 MG tablet Take 20 mg by mouth every evening.   03/13/2024   calcium  carbonate (TUMS - DOSED IN MG ELEMENTAL CALCIUM ) 500 MG chewable tablet Chew 1 tablet by mouth daily as needed for indigestion or heartburn.   Unknown   Coenzyme Q10 (COQ10) 200 MG CAPS Take 1 capsule by mouth in the morning.   03/13/2024   cyanocobalamin (VITAMIN B12) 1000 MCG tablet Take 1,000 mcg by mouth daily.   03/13/2024   furosemide  (LASIX ) 40 MG tablet Take 40 mg by mouth daily.   03/13/2024   gabapentin  (NEURONTIN ) 100 MG capsule Take 100 mg by mouth 3 (three) times daily.   03/13/2024   metoprolol  tartrate (LOPRESSOR ) 25 MG tablet Take 1 tablet (25 mg total) by mouth 2 (two) times daily.   03/13/2024   Multiple Vitamins-Minerals (CENTRUM SILVER 50+MEN) TABS Take 1 tablet by mouth in the morning.   03/13/2024   senna-docusate (SENOKOT-S) 8.6-50 MG tablet Take 1 tablet by mouth at bedtime as needed for mild constipation. (Patient  taking differently: Take 1 tablet by mouth daily as needed for mild constipation or moderate constipation.)   Unknown   simethicone  (MYLICON) 80 MG chewable tablet Chew 80 mg by mouth daily as needed for flatulence.   Unknown   traMADol  (ULTRAM ) 50 MG tablet Take 1 tablet (50 mg total) by mouth every 6 (six) hours as needed for moderate pain (pain score 4-6) or severe pain (pain score 7-10). (Patient taking differently: Take 50-100 mg by mouth 2 (two) times daily as needed for moderate pain (pain score 4-6) or severe pain (pain score 7-10).) 5 tablet 0 Unknown   albuterol  (VENTOLIN  HFA) 108 (90 Base) MCG/ACT inhaler Inhale 2 puffs into the lungs every 6 (six) hours as needed for wheezing or shortness of breath.      nystatin  (MYCOSTATIN /NYSTOP ) powder Apply topically 3 (three) times daily. (Patient not taking: Reported on 03/14/2024)   Not Taking   valsartan (DIOVAN) 320 MG tablet Take 320 mg by mouth daily.       Assessment: 78 y.o. 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 ECHO suggestive of early thrombus. CT head shows calcified mass. NSG note that calcification poses no acute risk and pt may be heparinized and anticoagulated. Pt received SQ heparin  5000 units ~1300. Hgb 9.1, plt elevated.   Goal of Therapy:  Heparin  level 0.3-0.7 units/ml Monitor platelets by anticoagulation protocol: Yes   Plan:  D/c SQ heparin  Heparin  IV bolus 4000 units Heparin  gtt at 1600 units/hr Will f/u heparin  level in 8 hours Daily heparin  level and CBC  Vito Ralph, PharmD, BCPS Please see amion for complete clinical pharmacist phone list 03/15/2024,7:58 PM  ADDENDUM (2130) Contacted via secure chat by Dr. Loni (cardiologist) who stated that she does not think there is an LV thrombus and she would like to d/c the heparin . She stated that she communicated this to the primary team but appears Elink MD initiated the heparin . RN to notify Elink MD.  Plan: D/c heparin  consult (v.o. Dr. Loni) Restart SQ  heparin  5000 units q8h  Vito Ralph, PharmD, BCPS Please see amion for complete clinical pharmacist phone list 03/15/2024  9:38 PM

## 2024-03-15 NOTE — Plan of Care (Signed)
   Problem: Coping: Goal: Ability to adjust to condition or change in health will improve Outcome: Progressing   Problem: Fluid Volume: Goal: Ability to maintain a balanced intake and output will improve Outcome: Progressing

## 2024-03-15 NOTE — Progress Notes (Addendum)
 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 Minimal hematuria which has cleared since admission
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
## 2024-03-15 NOTE — Progress Notes (Signed)
 ECHO results noted.   Will consult cardiology  Obtaining Ct head before starting IV heparin 

## 2024-03-15 NOTE — Progress Notes (Signed)
 Patient ID: Curtis Hill, male   DOB: 09-20-1945, 78 y.o.   MRN: [id number] BP (!) 121/96 (BP Location: Right Arm)   Pulse (!) 125   Temp 97.8 F (36.6 C) (Oral)   Resp 20   Ht 5' 2 (1.575 m)   Wt (!) 175.4 kg   SpO2 95%   BMI 70.73 kg/m  Head CT reviewed. The parafalcine calcification poses no acute risk. He may be heparinized and anticoagulated. There are no indications this mass needs resection. There is no mass effect. Basal cisterns widely patent, ventricles are not effaced.

## 2024-03-15 NOTE — Progress Notes (Addendum)
 eLink Physician-Brief Progress Note Patient Name: Curtis Hill DOB: February 15, 1946 MRN: [id number]   Date of Service  03/15/2024  HPI/Events of Note  Suspected LV apical thrombus, primary team recommended initiation of heparin  if confirmed on ECHO. ECHO suggestive of early thrombus. CT head findings already discussed with NSG  eICU Interventions  Start Therapeutic heparin  for LV apical thrombus   2223 - Update from cardiology, team does not believe its a thrombus, and given patient has hematuria, they do not want the patient to be started on Heparin  gtt unless absolutely necessary. Revert to prophylactic dosing.  Intervention Category Intermediate Interventions: Bleeding - evaluation and treatment with blood products  Joelynn Dust 03/15/2024, 7:50 PM
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
## 2024-03-15 NOTE — Evaluation (Signed)
 Occupational Therapy Evaluation Patient Details Name: Curtis Hill MRN: [id number] DOB: Aug 07, 1945 Today's Date: 03/15/2024   History of Present Illness   78 yo male presenting 12/2 with blood from urethra and inability to void pt being worked up for urosepsis and urinary retention PMH HTN, morbid obesity, OSA, thoracic aortic aneurysm, aortic stenosis, pulmonary hypertension, prostate CA s/p EBRT 2012     Clinical Impressions At baseline, pt requires Set up to Min assist for UB ADLs and Total assist with LB ADLs and uses a hoyer lift for functional transfers between bed and w/c. Pt active with HH OT and PT just prior to this admission. Pt now presents with decreased activity tolerance, generalized B UE weakness, decreased cognition, and decreased safety and independence with functional tasks. Pt currently demonstrating ability to complete ADLs with Set up-Contact guard assist to Total assist +2 and bed mobility with Max +2 to Total assist +2. Pt participated well in session. Pt HR into 130s during bed mobility and SpO2 maintained in 90s on RA throughout session. Pt will benefit from acute OT services to address deficits and increase safety and independence with functional tasks. Post acute discharge, pt will benefit from return home with continued [facility] OT paired with continued assist of family.      If plan is discharge home, recommend the following:   Two people to help with walking and/or transfers;Two people to help with bathing/dressing/bathroom;Assistance with cooking/housework;Assist for transportation;Help with stairs or ramp for entrance;Direct supervision/assist for medications management;Direct supervision/assist for financial management     Functional Status Assessment   Patient has had a recent decline in their functional status and demonstrates the ability to make significant improvements in function in a reasonable and predictable amount of time.     Equipment  Recommendations   None recommended by OT (Pt already has needed equipment)     Recommendations for Other Services         Precautions/Restrictions   Precautions Precautions: Fall;Other (comment) Precaution/Restrictions Comments: large pannus Restrictions Weight Bearing Restrictions Per Provider Order: No     Mobility Bed Mobility Overal bed mobility: Needs Assistance Bed Mobility: Supine to Sit, Sit to Supine, Rolling Rolling: Max assist, +2 for physical assistance   Supine to sit: Max assist, +2 for physical assistance, Used rails Sit to supine: Total assist   General bed mobility comments: pt able to assist with transition to EOB using bedrails. Total assist for return to supine.    Transfers                   General transfer comment: unable to progress beyond EOB      Balance Overall balance assessment: Needs assistance Sitting-balance support: Bilateral upper extremity supported, Feet supported Sitting balance-Leahy Scale: Poor Sitting balance - Comments: Pt requiring Min to Max assist to maintain sitting at EOB due to posterior lean Postural control: Posterior lean                                 ADL either performed or assessed with clinical judgement   ADL Overall ADL's : Needs assistance/impaired Eating/Feeding: Set up;Bed level   Grooming: Set up;Contact guard assist;Bed level   Upper Body Bathing: Moderate assistance;Bed level (+2 physical assist to roll to wash/dry back)   Lower Body Bathing: Total assistance;+2 for physical assistance;Bed level   Upper Body Dressing : Moderate assistance;Maximal assistance;Bed level   Lower Body Dressing: Total assistance;+2  for physical assistance;Bed level     Toilet Transfer Details (indicate cue type and reason): unable a this time Toileting- Architect and Hygiene: Total assistance;Bed level         General ADL Comments: Pt with decreased activity tolerance,  fatiguing quickly during tasks. Pt functional level also limited by pain in pannus with bed mobility.     Vision Baseline Vision/History: 1 Wears glasses Ability to See in Adequate Light: 0 Adequate (with glasses) Patient Visual Report: No change from baseline Vision Assessment?: No apparent visual deficits (with glasses on)     Perception         Praxis         Pertinent Vitals/Pain Pain Assessment Pain Assessment: Faces Faces Pain Scale: Hurts even more Pain Location: pannus and back Pain Descriptors / Indicators: Grimacing, Sore Pain Intervention(s): Limited activity within patient's tolerance, Monitored during session, Premedicated before session, Repositioned     Extremity/Trunk Assessment Upper Extremity Assessment Upper Extremity Assessment: Left hand dominant;Generalized weakness   Lower Extremity Assessment Lower Extremity Assessment: Defer to PT evaluation   Cervical / Trunk Assessment Cervical / Trunk Assessment: Other exceptions Cervical / Trunk Exceptions: increased body habitus; large pannus   Communication Communication Communication: Impaired Factors Affecting Communication: Difficulty expressing self (difficulty with word finding; requiring increased time for processing)   Cognition Arousal: Alert Behavior During Therapy: [facility] for tasks assessed/performed Cognition: Cognition impaired, No family/caregiver present to determine baseline     Awareness: Intellectual awareness intact, Online awareness intact Memory impairment (select all impairments): Working memory Attention impairment (select first level of impairment): Alternating attention Executive functioning impairment (select all impairments): Organization, Problem solving OT - Cognition Comments: Pt AAOx4 and pleasant throughout session with cognitive deficits noted. No caregiver/family present to confirm baseline.                 Following commands: Intact       Cueing  General  Comments   Cueing Techniques: Verbal cues  HR into 130s during bed mobility. SpO2 maintained in 90s on RA. RN present during a portion of session.   Exercises     Shoulder Instructions      Home Living Family/patient expects to be discharged to:: Private residence Living Arrangements: Spouse/significant other;Children (son) Available Help at Discharge: Family;Available 24 hours/day Type of Home: House Home Access: Stairs to enter;Ramped entrance (Flexsteps- steps convert to ramp) Entrance Stairs-Number of Steps: 4 Entrance Stairs-Rails: Right;Left;Can reach both Home Layout: One level;Laundry or work area in basement (son lives in finished basement; pt and his wife live on main floor) Alternate Level Stairs-Number of Steps: flight   Bathroom Shower/Tub: Walk-in shower (with about a 6 lip)   Bathroom Toilet: Handicapped height     Home Equipment: Agricultural Consultant (2 wheels);Lift chair;Hospital bed;BSC/3in1;Other (comment);Wheelchair - manual;Wheelchair - power Secondary School Teacher; bari RW)   Additional Comments: Active with HHPT/OT at time of admission.      Prior Functioning/Environment Prior Level of Function : Needs assist             Mobility Comments: Primarily bedbound. Hoyer lift for transfers to lift chair vs w/c. Pt states neither chair is comfortable for him so he does not tolerate well. Working on sitting EOB and standing with HHPT. Prior to hospitalization in May 2025, pt was ambulating household distances with a bari RW. ADLs Comments: At baseline, pt requires Set up to Min assist with UB ADLs and Total assist for LB ADLs from bed level and receives assistance  for IADLs. Prior to hospital admission in May 2025, pt reported he was Mod I with ADLs, but uncertain of pt thoroughness as areas of skin breakdown present at that time.    OT Problem List: Decreased strength;Decreased activity tolerance;Impaired balance (sitting and/or standing);Decreased cognition   OT  Treatment/Interventions: Self-care/ADL training;Therapeutic exercise;DME and/or AE instruction;Therapeutic activities;Cognitive remediation/compensation;Patient/family education;Balance training      OT Goals(Current goals can be found in the care plan section)   Acute Rehab OT Goals OT Goal Formulation: With patient Time For Goal Achievement: 03/29/24 Potential to Achieve Goals: Fair ADL Goals Pt Will Perform Grooming: with contact guard assist;sitting (sitting EOB with Fair balance) Pt Will Perform Upper Body Bathing: with min assist;sitting Pt Will Perform Upper Body Dressing: with min assist;sitting Additional ADL Goal #1: Patient will roll L/R in the bed during/in preparation for ADLs with Mod assist with use of bed rails.   OT Frequency:  Min 1X/week    Co-evaluation PT/OT/SLP Co-Evaluation/Treatment: Yes Reason for Co-Treatment: For patient/therapist safety;To address functional/ADL transfers   OT goals addressed during session: ADL's and self-care      AM-PAC OT 6 Clicks Daily Activity     Outcome Measure Help from another person eating meals?: A Little Help from another person taking care of personal grooming?: A Little Help from another person toileting, which includes using toliet, bedpan, or urinal?: Total Help from another person bathing (including washing, rinsing, drying)?: A Lot Help from another person to put on and taking off regular upper body clothing?: A Lot Help from another person to put on and taking off regular lower body clothing?: Total 6 Click Score: 12   End of Session Nurse Communication: Mobility status;Other (comment) (Gown and bed pad changed due to moisture from sweating. RN present during a portion of session.)  Activity Tolerance: Patient tolerated treatment well Patient left: in bed;with call bell/phone within reach  OT Visit Diagnosis: Other abnormalities of gait and mobility (R26.89);Muscle weakness (generalized) (M62.81);Other  symptoms and signs involving cognitive function                Time: 0940-1006 OT Time Calculation (min): 26 min Charges:  OT General Charges $OT Visit: 1 Visit OT Evaluation $OT Eval Moderate Complexity: 1 Mod  Margarie Rockey HERO., OTR/L, MA Acute Rehab [phone]   Margarie FORBES Horns 03/15/2024, 3:54 PM

## 2024-03-16 ENCOUNTER — Telehealth: Payer: Self-pay | Admitting: Diagnostic Neuroimaging

## 2024-03-16 ENCOUNTER — Inpatient Hospital Stay (HOSPITAL_COMMUNITY)

## 2024-03-16 ENCOUNTER — Other Ambulatory Visit (HOSPITAL_COMMUNITY): Payer: Self-pay

## 2024-03-16 DIAGNOSIS — N281 Cyst of kidney, acquired: Secondary | ICD-10-CM | POA: Diagnosis not present

## 2024-03-16 LAB — CBC
HCT: 24.4 % — ABNORMAL LOW (ref 39.0–52.0)
Hemoglobin: 7.3 g/dL — ABNORMAL LOW (ref 13.0–17.0)
MCH: 27.8 pg (ref 26.0–34.0)
MCHC: 29.9 g/dL — ABNORMAL LOW (ref 30.0–36.0)
MCV: 92.8 fL (ref 80.0–100.0)
Platelets: 287 K/uL (ref 150–400)
RBC: 2.63 MIL/uL — ABNORMAL LOW (ref 4.22–5.81)
RDW: 19 % — ABNORMAL HIGH (ref 11.5–15.5)
WBC: 16.9 K/uL — ABNORMAL HIGH (ref 4.0–10.5)
nRBC: 0 % (ref 0.0–0.2)

## 2024-03-16 LAB — COMPREHENSIVE METABOLIC PANEL WITH GFR
ALT: 17 U/L (ref 0–44)
AST: 22 U/L (ref 15–41)
Albumin: 1.5 g/dL — ABNORMAL LOW (ref 3.5–5.0)
Alkaline Phosphatase: 67 U/L (ref 38–126)
Anion gap: 7 (ref 5–15)
BUN: 29 mg/dL — ABNORMAL HIGH (ref 8–23)
CO2: 29 mmol/L (ref 22–32)
Calcium: 8.4 mg/dL — ABNORMAL LOW (ref 8.9–10.3)
Chloride: 96 mmol/L — ABNORMAL LOW (ref 98–111)
Creatinine, Ser: 1.45 mg/dL — ABNORMAL HIGH (ref 0.61–1.24)
GFR, Estimated: 50 mL/min — ABNORMAL LOW (ref >60)
Glucose, Bld: 92 mg/dL (ref 70–99)
Potassium: 3.8 mmol/L (ref 3.5–5.1)
Sodium: 132 mmol/L — ABNORMAL LOW (ref 135–145)
Total Bilirubin: 0.7 mg/dL (ref 0.0–1.2)
Total Protein: 5.5 g/dL — ABNORMAL LOW (ref 6.5–8.1)

## 2024-03-16 LAB — URINE CULTURE: Culture: <10000 — AB

## 2024-03-16 LAB — GLUCOSE, CAPILLARY
Glucose-Capillary: 89 mg/dL (ref 70–99)
Glucose-Capillary: 102 mg/dL — ABNORMAL HIGH (ref 70–99)
Glucose-Capillary: 111 mg/dL — ABNORMAL HIGH (ref 70–99)
Glucose-Capillary: 94 mg/dL (ref 70–99)
Glucose-Capillary: 98 mg/dL (ref 70–99)
Glucose-Capillary: 73 mg/dL (ref 70–99)
Glucose-Capillary: 107 mg/dL — ABNORMAL HIGH (ref 70–99)

## 2024-03-16 MED ORDER — BENZONATATE 100 MG PO CAPS
100.0000 mg | ORAL_CAPSULE | Freq: Two times a day (BID) | ORAL | Status: DC | PRN
  Administered 2024-03-16: 100 mg via ORAL
  Filled 2024-03-16: qty 1

## 2024-03-16 MED ORDER — SALINE SPRAY 0.65 % NA SOLN
1.0000 | NASAL | Status: DC | PRN
  Administered 2024-03-17: 1 via NASAL
  Filled 2024-03-16: qty 44

## 2024-03-16 MED ORDER — METOPROLOL TARTRATE 25 MG/10 ML ORAL SUSPENSION
12.5000 mg | Freq: Two times a day (BID) | ORAL | Status: DC
  Administered 2024-03-16 – 2024-03-20 (×8): 12.5 mg via ORAL
  Filled 2024-03-16 (×9): qty 5

## 2024-03-16 MED ORDER — INFLUENZA VAC SPLIT HIGH-DOSE 0.5 ML IM SUSY
0.5000 mL | PREFILLED_SYRINGE | INTRAMUSCULAR | Status: AC
  Administered 2024-03-17: 0.5 mL via INTRAMUSCULAR
  Filled 2024-03-16: qty 0.5

## 2024-03-16 MED ORDER — GABAPENTIN 100 MG PO CAPS
100.0000 mg | ORAL_CAPSULE | Freq: Three times a day (TID) | ORAL | Status: DC
  Administered 2024-03-16 – 2024-03-20 (×12): 100 mg via ORAL
  Filled 2024-03-16 (×12): qty 1

## 2024-03-16 MED ORDER — ALLOPURINOL 300 MG PO TABS
300.0000 mg | ORAL_TABLET | Freq: Every day | ORAL | Status: DC
  Administered 2024-03-16 – 2024-03-20 (×5): 300 mg via ORAL
  Filled 2024-03-16 (×5): qty 1

## 2024-03-16 MED ORDER — FLUTICASONE PROPIONATE 50 MCG/ACT NA SUSP
1.0000 | NASAL | Status: DC | PRN
  Filled 2024-03-16: qty 16

## 2024-03-16 MED ORDER — BISACODYL 5 MG PO TBEC
10.0000 mg | DELAYED_RELEASE_TABLET | Freq: Every day | ORAL | Status: DC
  Administered 2024-03-16 – 2024-03-20 (×5): 10 mg via ORAL
  Filled 2024-03-16 (×5): qty 2

## 2024-03-16 MED ORDER — PHENOL 1.4 % MT LIQD
1.0000 | OROMUCOSAL | Status: DC | PRN
  Administered 2024-03-18: 1 via OROMUCOSAL
  Filled 2024-03-16: qty 177

## 2024-03-16 MED ORDER — DOCUSATE SODIUM 100 MG PO CAPS
100.0000 mg | ORAL_CAPSULE | Freq: Two times a day (BID) | ORAL | Status: DC
  Administered 2024-03-16 – 2024-03-20 (×9): 100 mg via ORAL
  Filled 2024-03-16 (×9): qty 1
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
## 2024-03-16 NOTE — Progress Notes (Signed)
  Progress Note   Date: 03/16/2024  Patient Name: Curtis Hill        MRN#: [id number]  Clarification of diagnosis:  Acute on chronic anemia due to blood loss

## 2024-03-16 NOTE — Progress Notes (Signed)
 Chart reviewed.  CBC may be dilutional but Hgb is 7.3 today. No recurrent bleeding is reported.  Patient received heparin  briefly yesterday based on echo report, however my interpretation is that there is no discrete thrombus but rather swirling contrast.Heparin  risks outweigh any potential benefit currently. I do not believe heparin  is indicated at this time. I have reviewed the echo with colleagues who concur. I have reviewed this with Dr. Pleas and we will monitor patient with serial CBC.   Cardiology will again sign off.  Irja Wheless A Marshelle Bilger, MD

## 2024-03-16 NOTE — Progress Notes (Signed)
 Subjective: Curtis Hill looks well this morning.  We again discussed the pros and cons of long-term catheterization.  Clear yellow urine on rounds.  Objective: Vital signs in last 24 hours: Temp:  [97.8 F (36.6 C)-99.7 F (37.6 C)] 97.8 F (36.6 C) (12/04 0732) Pulse Rate:  [85-124] 95 (12/04 0800) Resp:  [11-26] 13 (12/04 0800) BP: (86-116)/(57-81) 97/68 (12/04 0800) SpO2:  [88 %-98 %] 95 % (12/04 0800) Weight:  [152.9 kg] 152.9 kg (12/04 0500)  Assessment/Plan: Pt is a 78 yo male with PMH prostate cancer s/p EBRT and radioactive seed placement (2012) who presented to [facility] ED with urosepsis and urinary retention.    # Urinary retention Difficult Foley placement requiring cystoscopy.  Catheter to stay in place for at least 7 days for stretch injury. Trend serum creatinine Renal ultrasound at 48-hour mark shows resolution of hydronephrosis Urology will follow along peripherally and consider voiding trial on or about 12/9.  Please call with questions  Intake/Output from previous day: 12/03 0701 - 12/04 0700 In: 1200 [I.V.:860.1; IV Piggyback:339.9] Out: 2350 [Urine:2350]  Intake/Output this shift: Total I/O In: 193.8 [IV Piggyback:193.8] Out: 155 [Urine:155]  Physical Exam:  General: Alert and oriented CV: No cyanosis Lungs: equal chest rise Abdomen: Large pannus Gu: Foley catheter in place draining clear yellow urine  Lab Results: Recent Labs    03/14/24 0657 03/15/24 0242 03/16/24 0413  HGB 10.1* 9.1* 7.3*  HCT 33.6* 30.0* 24.4*   BMET Recent Labs    03/15/24 0242 03/16/24 0413  NA 135 132*  K 4.3 3.8  CL 96* 96*  CO2 28 29  GLUCOSE 142* 92  BUN 33* 29*  CREATININE 1.66* 1.45*  CALCIUM  8.8* 8.4*  HGB 9.1* 7.3*  WBC 34.5* 16.9*     Studies/Results: US  RENAL Result Date: 03/16/2024 EXAM: US  Retroperitoneum Complete, Renal. 03/16/2024 03:23:00 AM TECHNIQUE: Real-time ultrasonography of the retroperitoneum renal was performed. COMPARISON: CT  examination of 03/14/2024. CLINICAL HISTORY: Urinary retention. FINDINGS: FINDINGS: RIGHT KIDNEY/URETER: Right kidney measures 13.0 x 6.9 x 7.0 cm with an estimated volume of 326 cc. Renal cortical thickness and echogenicity are within normal limits. No hydronephrosis. No calculus. An exophytic solid hypoechoic lesion is incompletely characterized arising from the upper medial interpolar region of the right kidney measuring at least 13 x 20 mm. This was, however, previously identified on CT examination of 05/19/2017 and demonstrates an interval decrease in size and is most in keeping with an involuting cortical cyst. LEFT KIDNEY/URETER: Left kidney measures 14.3 x 7.1 x 6.7 cm with an estimated volume of 357 cc. A 12 mm simple cortical cyst is noted within the interpolar region of the left kidney for which no follow-up imaging is recommended. There is a vague intracortical hypoechoic solid region measuring roughly 20 x 24 mm (image 21) which may represent a primary renal neoplasm. Further evaluation with pre and post contrast MRI or CT should be considered. MRI is preferred in younger patients (due to lack of ionizing radiation) and for evaluating calcified lesion(s). Exophytic simple cortical cyst arises from the interpolar region of the left kidney. 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 No hydronephrosis. No calculus. BLADDER: The bladder is decompressed and is not visualized. IMPRESSION: 1. Vague intracortical hypoechoic solid region in the left kidney measuring roughly 20 x 24 mm, possibly representing a primary renal neoplasm; recommend further  evaluation with pre- and post-contrast MRI or CT, with MRI preferred in younger patients and for evaluating calcified lesions. 2. Exophytic solid hypoechoic lesion in the left kidney, incompletely characterized, measuring at least 13 x 20 mm, previously  identified on CT of 2019 and decreased in size, most in keeping with an involuting cortical cyst. 3. 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 No follow-up imaging is specifically recommended for this lesion. Electronically signed by: Dorethia Molt MD 03/16/2024 03:37 AM EST RP Workstation: [id number]   CT HEAD WO CONTRAST ( ) Result Date: 03/15/2024 EXAM: CT HEAD WITHOUT CONTRAST 03/15/2024 03:20:00 PM TECHNIQUE: CT of the head was performed without the administration of intravenous contrast. Automated exposure control, iterative reconstruction, and/or weight based adjustment of the mA/kV was utilized to reduce the radiation dose to as low as reasonably achievable. COMPARISON: None available. CLINICAL HISTORY: Neuro deficit, acute, stroke suspected. FINDINGS: BRAIN AND VENTRICLES: A partially calcified dural based mass lesion extends from the midline to the right over the right parietal convexity measuring 20 x 17 x 14 mm. Minimal mass effect is present over the medial left parietal convexity. Mild white matter changes are within normal limits of age. Atherosclerotic calcifications are present in the cavernous carotid arteries bilaterally and at the dural margin of both vertebral arteries. No hyperdense vessel is present. No acute hemorrhage. No evidence of acute infarct. No hydrocephalus. No other acute extra-axial collection. No midline shift. ORBITS: Bilateral lens replacements are noted. The globes and orbits are otherwise within normal limits. SINUSES: No acute abnormality. SOFT TISSUES AND SKULL: No acute soft tissue abnormality. No skull fracture. IMPRESSION: 1. No acute intracranial abnormality. 2. Partially calcified dural-based mass over the right parietal convexity measuring 20 x 17 x 14 mm with minimal mass effect over the medial left parietal convexity. Electronically signed by: Lonni Necessary MD  03/15/2024 03:28 PM EST RP Workstation: [id number]   ECHOCARDIOGRAM LIMITED Result Date: 03/15/2024    ECHOCARDIOGRAM LIMITED REPORT   Patient Name:   Curtis Hill Date of Exam: 03/15/2024 Medical Rec #:  [id number]     Height:       62.0 in Accession #:    [id number]    Weight:       386.7 lb Date of Birth:  1946/01/08    BSA:          2.530 m Patient Age:    77 years      BP:           86/70 mmHg Patient Gender: M             HR:           100 bpm. Exam Location:  Inpatient Procedure: Limited Echo (Both Spectral and Color Flow Doppler were utilized            during procedure). Indications:    limited LV funtion  History:        Patient has prior history of Echocardiogram examinations, most                 recent 03/15/2024. CAD; Risk Factors:Hypertension.  Sonographer:    Carmelita Hartshorn RDCS, FE, PE Referring Phys: [id number] DIPTI PLEAS  Sonographer Comments: Patient is obese and Technically difficult study due to poor echo windows. Limited echo to asses LV apex with Definity  IMPRESSIONS  1. LV apex akinesis with swirling contrast suggestive of early  thrombus formation. Left ventricular ejection fraction, by estimation, is 60 to 65%. The left ventricle has normal function. FINDINGS  Left Ventricle: LV apex akinesis with swirling contrast suggestive of early thrombus formation. Left ventricular ejection fraction, by estimation, is 60 to 65%. The left ventricle has normal function. Joelle Cedars Tonleu Electronically signed by Joelle Cedars Ny Signature Date/Time: 03/15/2024/3:18:46 PM    Final    ECHOCARDIOGRAM COMPLETE Result Date: 03/15/2024    ECHOCARDIOGRAM REPORT   Patient Name:   Curtis Hill Date of Exam: 03/15/2024 Medical Rec #:  [id number]     Height:       62.0 in Accession #:    [id number]    Weight:       386.7 lb Date of Birth:  Feb 27, 1946    BSA:          2.530 m Patient Age:    77 years      BP:           122/95 mmHg Patient Gender: M             HR:           112 bpm. Exam Location:   Inpatient Procedure: 2D Echo, Cardiac Doppler, Color Doppler and Intracardiac            Opacification Agent (Both Spectral and Color Flow Doppler were            utilized during procedure). Indications:    Shock R57.9  History:        Patient has prior history of Echocardiogram examinations, most                 recent 10/23/2023. CAD; Risk Factors:Hypertension and Sleep                 Apnea.  Sonographer:    Jayson Gaskins Referring Phys: [id number] DIPTI BARAL IMPRESSIONS  1. There is possible swirling of contrast at that apex consistent of thrombus (#72, #81). Left ventricular ejection fraction, by estimation, is 55 to 60%. The left ventricle has normal function. Left ventricular endocardial border not optimally defined to evaluate regional wall motion. Indeterminate diastolic filling due to E-A fusion.  2. Right ventricular systolic function is normal. The right ventricular size is normal. Mildly increased right ventricular wall thickness.  3. The mitral valve is grossly normal. Trivial mitral valve regurgitation.  4. The aortic valve was not well visualized. Aortic valve regurgitation is not visualized. Aortic valve sclerosis/calcification is present, without any evidence of aortic stenosis.  5. Aortic dilatation noted. There is mild dilatation of the ascending aorta, measuring 42 mm. Comparison(s): Technically difficult evaluation but EF similar to prior echo. There is evidence od possible apical thrombus. Would repeat limited echo with contrast focusing at the apex. FINDINGS  Left Ventricle: There is possible swirling of contrast at that apex consistent of thrombus (#72, #81). Left ventricular ejection fraction, by estimation, is 55 to 60%. The left ventricle has normal function. Left ventricular endocardial border not optimally defined to evaluate regional wall motion. The left ventricular internal cavity size was normal in size. Suboptimal image quality limits for assessment of left ventricular hypertrophy.  Indeterminate diastolic filling due to E-A fusion. Right Ventricle: The right ventricular size is normal. Mildly increased right ventricular wall thickness. Right ventricular systolic function is normal. Left Atrium: Left atrial size was normal in size. Right Atrium: Right atrial size was normal in size. Pericardium: There is no evidence of pericardial effusion. Presence of epicardial  fat layer. Mitral Valve: The mitral valve is grossly normal. Trivial mitral valve regurgitation. Tricuspid Valve: The tricuspid valve is not well visualized. Tricuspid valve regurgitation is trivial. Aortic Valve: The aortic valve was not well visualized. Aortic valve regurgitation is not visualized. Aortic valve sclerosis/calcification is present, without any evidence of aortic stenosis. Aortic valve mean gradient measures 11.0 mmHg. Aortic valve peak gradient measures 18.5 mmHg. Aortic valve area, by VTI measures 1.67 cm. Pulmonic Valve: The pulmonic valve was not well visualized. Pulmonic valve regurgitation is not visualized. Aorta: Aortic dilatation noted. There is mild dilatation of the ascending aorta, measuring 42 mm. Venous: The inferior vena cava was not well visualized. IAS/Shunts: No atrial level shunt detected by color flow Doppler.  LEFT VENTRICLE PLAX 2D LVIDd:         4.84 cm LVIDs:         3.60 cm LV PW:         1.67 cm LV IVS:        1.49 cm LVOT diam:     2.00 cm LV SV:         59 LV SV Index:   23 LVOT Area:     3.14 cm  RIGHT VENTRICLE RV S prime:     24.00 cm/s  PULMONARY VEINS TAPSE (M-mode): 3.4 cm      Systolic Velocity: 73.00 cm/s LEFT ATRIUM             Index LA Vol (A2C):   40.7 ml 16.09 ml/m LA Vol (A4C):   35.3 ml 13.95 ml/m LA Biplane Vol: 38.6 ml 15.26 ml/m  AORTIC VALVE AV Area (Vmax):    1.90 cm AV Area (Vmean):   2.04 cm AV Area (VTI):     1.67 cm AV Vmax:           215.00 cm/s AV Vmean:          157.000 cm/s AV VTI:            0.356 m AV Peak Grad:      18.5 mmHg AV Mean Grad:      11.0 mmHg  LVOT Vmax:         130.00 cm/s LVOT Vmean:        102.000 cm/s LVOT VTI:          0.189 m LVOT/AV VTI ratio: 0.53  AORTA Ao Asc diam: 4.20 cm  SHUNTS Systemic VTI:  0.19 m Systemic Diam: 2.00 cm Joelle Cedars Tonleu Electronically signed by Joelle Cedars Tonleu Signature Date/Time: 03/15/2024/12:46:47 PM    Final       LOS: 2 days   Ole Bourdon, NP [facility] Pager: [phone]  03/16/2024, 9:49 AM

## 2024-03-16 NOTE — TOC Progression Note (Signed)
 Transition of Care [facility]) - Progression Note    Patient Details  Name: ZYMARION FAVORITE MRN: [id number] Date of Birth: [birth year]  [facility] CM/SW Contact  Roxie KANDICE Stain, RN Phone Number: 03/16/2024, 1:30 PM  Clinical Narrative:    Spoke to patient regarding transition needs. Patient is active with [facility] home [facility]. Will need resumption orders.  Jon with [facility] aware of admission.  Patient requesting stretcher  transportation information to get to apts until he feels safe driving his new power wheelchair.  Resources given to patient.    Expected Discharge Plan: Skilled Nursing Facility Barriers to Discharge: Continued Medical Work up               Expected Discharge Plan and Services       Living arrangements for the past 2 months: Single Family Home                                       Social Drivers of Health (SDOH) Interventions SDOH Screenings   Food Insecurity: No Food Insecurity (03/14/2024)  Housing: Low Risk  (03/14/2024)  Transportation Needs: Unmet Transportation Needs (03/14/2024)  Utilities: Not At Risk (03/14/2024)  Depression (PHQ2-9): Medium Risk (05/23/2020)  Social Connections: Moderately Integrated (03/14/2024)  Tobacco Use: Low Risk  (03/13/2024)    Readmission Risk Interventions     No data to display

## 2024-03-16 NOTE — Telephone Encounter (Signed)
 Appointment details confirmed

## 2024-03-16 NOTE — Plan of Care (Signed)

## 2024-03-17 DIAGNOSIS — N390 Urinary tract infection, site not specified: Secondary | ICD-10-CM | POA: Diagnosis not present

## 2024-03-17 DIAGNOSIS — A419 Sepsis, unspecified organism: Secondary | ICD-10-CM | POA: Diagnosis not present

## 2024-03-17 LAB — BASIC METABOLIC PANEL WITH GFR
Anion gap: 9 (ref 5–15)
BUN: 24 mg/dL — ABNORMAL HIGH (ref 8–23)
CO2: 29 mmol/L (ref 22–32)
Calcium: 8.6 mg/dL — ABNORMAL LOW (ref 8.9–10.3)
Chloride: 95 mmol/L — ABNORMAL LOW (ref 98–111)
Creatinine, Ser: 1.31 mg/dL — ABNORMAL HIGH (ref 0.61–1.24)
GFR, Estimated: 56 mL/min — ABNORMAL LOW (ref >60)
Glucose, Bld: 98 mg/dL (ref 70–99)
Potassium: 3.5 mmol/L (ref 3.5–5.1)
Sodium: 133 mmol/L — ABNORMAL LOW (ref 135–145)

## 2024-03-17 LAB — CBC
HCT: 25.2 % — ABNORMAL LOW (ref 39.0–52.0)
Hemoglobin: 7.6 g/dL — ABNORMAL LOW (ref 13.0–17.0)
MCH: 28.3 pg (ref 26.0–34.0)
MCHC: 30.2 g/dL (ref 30.0–36.0)
MCV: 93.7 fL (ref 80.0–100.0)
Platelets: 282 K/uL (ref 150–400)
RBC: 2.69 MIL/uL — ABNORMAL LOW (ref 4.22–5.81)
RDW: 18.7 % — ABNORMAL HIGH (ref 11.5–15.5)
WBC: 11.7 K/uL — ABNORMAL HIGH (ref 4.0–10.5)
nRBC: 0 % (ref 0.0–0.2)

## 2024-03-17 LAB — CULTURE, BLOOD (ROUTINE X 2)

## 2024-03-17 LAB — GLUCOSE, CAPILLARY
Glucose-Capillary: 93 mg/dL (ref 70–99)
Glucose-Capillary: 95 mg/dL (ref 70–99)
Glucose-Capillary: 113 mg/dL — ABNORMAL HIGH (ref 70–99)
Glucose-Capillary: 98 mg/dL (ref 70–99)
Glucose-Capillary: 110 mg/dL — ABNORMAL HIGH (ref 70–99)

## 2024-03-17 LAB — MAGNESIUM: Magnesium: 2 mg/dL (ref 1.7–2.4)

## 2024-03-17 MED ORDER — INSULIN ASPART 100 UNIT/ML IJ SOLN: 0.0000 [IU] | Freq: Three times a day (TID) | INTRAMUSCULAR | Status: DC

## 2024-03-17 NOTE — Care Management Important Message (Signed)
 Important Message  Patient Details  Name: Curtis Hill MRN: [id number] Date of Birth: August 05, 1945   Important Message Given:  Yes - Medicare IM     Claretta Deed 03/17/2024, 3:07 PM

## 2024-03-17 NOTE — Progress Notes (Addendum)
 PROGRESS NOTE    Curtis Hill  FMW:[id number] DOB: 12-13-1945 DOA: 03/13/2024 PCP: Okey Carlin Redbird, MD   Brief Narrative:  78 yo male presented with penile discharge that was bloody. Pt has h/o chronic cystitis/prostate cancer and was recently admitted in August with hemorrhagic/emphysematous cystitis. During that hospitalization urology was required for foley insertion.  Managed for septic shock, secondary to UTI (resolved now), AKI (Improving) Off of levophed  Urology on board for foley management. Transferred to [facility] on 12/5. Lives at home with his wife and son and has a nurse, adult, power WC and HHS.   Assessment & Plan:  Principal Problem:   Sepsis due to urinary tract infection (HCC) Active Problems:   Hypotensive episode   BMI 70 and over, adult (HCC)   Hyperlipidemia LDL goal <70   CAD (coronary artery disease)   Pulmonary HTN (HCC)   Prediabetes   AKI (acute kidney injury)   OSA (obstructive sleep apnea)   Hemorrhagic cystitis   Acute cystitis   Benign essential HTN   Debility   Essential hypertension   Thoracic aortic aneurysm (TAA)   Vitamin D deficiency   Hematuria   Septic Shock secondary to  presumed UTI from from bladder outlet obstruction and Gram+ Bacteremia   - Blood cultures 12/1 > streptococcus mitis/oralis, Staphylococcus capitis (1/4) > susceptibilities pending.  - Shock has resolved and he is off of levophed  now -Continue with IV Cefepime  2 g Q12H    History of CAD: No chest pain  HLD  Continue statin   Hydronephrosis 2/2 bladder outlet obstruction, foley catheter  Chronic cystitis Hx of prostate cancer s/p EBRT and radioactive seed placement (2012)  AKI, Cr today is 1.3 12/4 > Renal US  > Exophytic solid hypoechoic lesion in the left kidney decreased in size compared 2019; Mildly complex septated cyst in the upper pole of the left kidney.  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   Normocytic anemia Hgb 7.3 (9.1), no hematuria noted  - Transfuse if Hgb < 7    Bronchial asthma OSA ,not on CPAP at home Chronic hypoxic respiratory failure on 3 L of oxygen  RA, breathing comfortably, no wheezing or crackles noted. -PRN supplemental oxygen  to maintain SpO2 between 88-92% - PRN Atrovent     History of pulmonary hypertension Essential HTN  - Hold home lopressor  25 BID, lasix  40 mg daily, valsartan 320 mg daily  - Can start resuming once blood pressure improves and continue function improves   GERD :continue PPI   Prediabetes > SSI sensitive   Physical deconditioning: home health PT/OT   Class III obesity: as evidenced by BMI of 62.6. Likely needs out patient evaluation for MBS  Disposition:  Home with his wife and son. Active with HHS.   DVT prophylaxis: heparin  injection 5,000 Units Start: 03/16/24 0600 Place and maintain sequential compression device Start: 03/14/24 0759 Place TED hose Start: 03/14/24 0738 SCDs Start: 03/14/24 0737     Code Status: Full Code Family Communication:  none at the bedside Status is: Inpatient Remains inpatient appropriate because: UTI, AKI    Subjective:  Complaining of right sided back pain. He has have chronic leg pain issues and  takes tylenol  and tramadol  at home. He has a nurse, adult, power wheel chair and other DME at home. Lives with his wife and son. He is feeling much better now and we spoke about following up with urology for foley catheter management. He is active with HHS (PT, RN).  Examination:  General exam: Appears calm and comfortable  Respiratory system: Clear to auscultation. Respiratory effort normal. Cardiovascular system: S1 & S2 heard, RRR. No JVD, murmurs, rubs, gallops or clicks. No pedal edema. Gastrointestinal system: Abdomen is nondistended, soft and nontender. No organomegaly or masses felt. Normal bowel sounds heard. Central nervous system: Alert and oriented. No focal neurological deficits. Extremities: Symmetric 5 x 5 power. Skin: No rashes, lesions or ulcers Psychiatry: Judgement and insight appear normal. Mood & affect appropriate.        Diet Orders (From admission, onward)     Start     Ordered   03/14/24 0737  Diet regular Room service appropriate? Yes; Fluid consistency: Thin  Diet effective now       Question Answer Comment  Room service appropriate? Yes   Fluid consistency: Thin      03/14/24 0744            Objective: Vitals:   03/16/24 2000 03/17/24 0500 03/17/24 0537 03/17/24 0804  BP: 113/72  96/60 111/74  Pulse: (!) 107  84 73  Resp: 19  19 18   Temp: 98.1 F (36.7 C)  (!) 97.5 F (36.4 C) 98.6 F (37 C)  TempSrc:   Oral   SpO2: 97%  95% 95%  Weight:  (!) 154.4 kg    Height:        Intake/Output Summary (Last 24 hours) at 03/17/2024 1128 Last data filed at 03/17/2024 0537 Gross per 24 hour  Intake --  Output 1210 ml  Net -1210 ml   Filed Weights   03/13/24 2126 03/16/24 0500 03/17/24 0500  Weight: (!) 175.4 kg (!) 152.9 kg (!) 154.4 kg    Scheduled Meds:  allopurinol   300 mg Oral Daily   atorvastatin   20 mg Oral QPM   bisacodyl   10 mg Oral Daily   Chlorhexidine  Gluconate Cloth  6 each Topical Daily   docusate sodium   100 mg Oral BID    gabapentin   100 mg Oral TID   heparin  injection (subcutaneous)  5,000 Units Subcutaneous Q8H   Influenza vac split trivalent PF  0.5 mL Intramuscular Tomorrow-1000   insulin  aspart  0-9 Units Subcutaneous Q4H   lidocaine   1 Application Topical Once   metoprolol  tartrate  12.5 mg Oral BID   polyethylene glycol  17 g Oral Daily   sodium chloride  flush  10-40 mL Intracatheter Q12H   sodium chloride  flush  3 mL Intravenous Q12H   Continuous Infusions:  ceFEPime  (MAXIPIME ) IV 2 g (03/17/24 0957)    Nutritional status     Body mass index is 62.26 kg/m.  Data Reviewed:   CBC: Recent Labs  Lab 03/14/24 0135 03/14/24 0155 03/14/24 0657 03/15/24 0242  03/16/24 0413 03/17/24 0329  WBC 24.8*  --  37.7* 34.5* 16.9* 11.7*  NEUTROABS 22.4*  --   --   --   --   --   HGB 9.8* 10.5* 10.1* 9.1* 7.3* 7.6*  HCT 33.3* 31.0* 33.6* 30.0* 24.4* 25.2*  MCV 95.4  --  94.1 93.2 92.8 93.7  PLT 428*  --  425* 409* 287 282   Basic Metabolic Panel: Recent Labs  Lab 03/14/24 0145 03/14/24 0155 03/14/24 0657 03/15/24 0242 03/16/24 0413 03/17/24 0329  NA 139 140 137 135 132* 133*  K 4.3 4.3 4.9 4.3 3.8 3.5  CL 100 101 99 96* 96* 95*  CO2 28  --  27 28 29 29   GLUCOSE 108* 101* 109* 142* 92 98  BUN 32* 34* 37* 33* 29* 24*  CREATININE 1.45* 1.50* 1.79* 1.66* 1.45* 1.31*  CALCIUM  8.7*  --  8.9 8.8* 8.4* 8.6*  MG 1.4*  --  1.3* 1.6*  --  2.0  PHOS  --   --  3.1 3.1  --   --    GFR: Estimated Creatinine Clearance: 63.1 mL/min (A) (by C-G formula based on SCr of 1.31 mg/dL (H)). Liver Function Tests: Recent Labs  Lab 03/14/24 0145 03/15/24 0242 03/16/24 0413  AST 20 17 22   ALT 13 13 17   ALKPHOS 74 66 67  BILITOT 0.6 0.5 0.7  PROT 6.9 6.3* 5.5*  ALBUMIN  2.1* 1.8* 1.5*   No results for input(s): LIPASE, AMYLASE in the last 168 hours. No results for input(s): AMMONIA in the last 168 hours. Coagulation Profile: Recent Labs  Lab 03/13/24 2356 03/15/24 0242  INR 1.0 1.3*    Cardiac Enzymes: No results for input(s): CKTOTAL, CKMB, CKMBINDEX, TROPONINI in the last 168 hours. BNP (last 3 results) No results for input(s): PROBNP in the last 8760 hours. HbA1C: No results for input(s): HGBA1C in the last 72 hours. CBG: Recent Labs  Lab 03/16/24 1957 03/16/24 2322 03/17/24 0244 03/17/24 0802 03/17/24 1117  GLUCAP 73 107* 93 95 113*   Lipid Profile: No results for input(s): CHOL, HDL, LDLCALC, TRIG, CHOLHDL, LDLDIRECT in the last 72 hours. Thyroid  Function Tests: No results for input(s): TSH, T4TOTAL, FREET4, T3FREE, THYROIDAB in the last 72 hours. Anemia Panel: No results for input(s): VITAMINB12, FOLATE, FERRITIN, TIBC, IRON, RETICCTPCT in the last 72 hours. Sepsis Labs: Recent Labs  Lab 03/14/24 0657 03/14/24 0952 03/14/24 1445 03/14/24 1625  LATICACIDVEN 2.2* 2.4* 1.7 1.9    Recent Results (from the past 240 hours)  Culture, blood (routine x 2)     Status: Abnormal   Collection Time: 03/13/24 10:52 PM   Specimen: BLOOD RIGHT HAND  Result Value Ref Range Status   Specimen Description BLOOD RIGHT HAND  Final   Special Requests   Final    BOTTLES DRAWN AEROBIC ONLY Blood Culture results may not be optimal due to an inadequate volume of blood received in culture bottles   Culture  Setup Time   Final    GRAM POSITIVE COCCI IN CHAINS AEROBIC BOTTLE ONLY Organism ID to follow CRITICAL RESULT CALLED TO, READ BACK BY AND VERIFIED WITH: PHARMD A. EARLYNE [id number] @ 1533 FH    Culture (A)  Final    STREPTOCOCCUS VESTIBULARIS STREPTOCOCCUS MITIS/ORALIS STAPHYLOCOCCUS CAPITIS THE SIGNIFICANCE OF ISOLATING THIS ORGANISM FROM A SINGLE SET OF BLOOD CULTURES WHEN MULTIPLE SETS ARE DRAWN IS UNCERTAIN. PLEASE NOTIFY THE MICROBIOLOGY DEPARTMENT WITHIN ONE WEEK IF SPECIATION AND SENSITIVITIES ARE REQUIRED. Performed at [facility]  Lab, [address], [place], [place] [address]    Report Status 03/17/2024  FINAL  Final   Organism ID, Bacteria STREPTOCOCCUS VESTIBULARIS  Final      Susceptibility   Streptococcus vestibularis - MIC*    PENICILLIN 0.25 INTERMEDIATE Intermediate     CEFTRIAXONE  <=0.12 SENSITIVE Sensitive     ERYTHROMYCIN <=0.12 SENSITIVE Sensitive     LEVOFLOXACIN  2 SENSITIVE Sensitive     VANCOMYCIN  1 SENSITIVE Sensitive     * STREPTOCOCCUS VESTIBULARIS  Blood Culture ID Panel (Reflexed)     Status: Abnormal   Collection Time: 03/13/24 10:52 PM  Result Value Ref Range Status   Enterococcus faecalis NOT DETECTED NOT DETECTED Final   Enterococcus Faecium NOT DETECTED NOT DETECTED Final   Listeria monocytogenes NOT DETECTED NOT DETECTED Final   Staphylococcus species DETECTED (A) NOT DETECTED Final    Comment: CRITICAL RESULT CALLED TO, READ BACK BY AND VERIFIED WITH: PHARMD A. EARLYNE [id number] @ 1533 FH    Staphylococcus aureus (BCID) NOT DETECTED NOT DETECTED Final   Staphylococcus epidermidis DETECTED (A) NOT DETECTED Final    Comment: Methicillin (oxacillin) resistant coagulase negative staphylococcus. Possible blood culture contaminant (unless isolated from more than one blood culture draw or clinical case suggests pathogenicity). No antibiotic treatment is indicated for blood  culture contaminants. CRITICAL RESULT CALLED TO, READ BACK BY AND VERIFIED WITH: PHARMD A. EARLYNE [id number] @ 1533 FH    Staphylococcus lugdunensis NOT DETECTED NOT DETECTED Final   Streptococcus species DETECTED (A) NOT DETECTED Final    Comment: Not Enterococcus species, Streptococcus agalactiae, Streptococcus pyogenes, or Streptococcus pneumoniae. CRITICAL RESULT CALLED TO, READ BACK BY AND VERIFIED WITH: PHARMD A. EARLYNE [id number] @ 1533 FH    Streptococcus agalactiae NOT DETECTED NOT DETECTED Final   Streptococcus pneumoniae NOT DETECTED NOT DETECTED Final   Streptococcus pyogenes NOT DETECTED NOT DETECTED Final   A.calcoaceticus-baumannii NOT DETECTED NOT DETECTED Final   Bacteroides  fragilis NOT DETECTED NOT DETECTED Final   Enterobacterales NOT DETECTED NOT DETECTED Final   Enterobacter cloacae complex NOT DETECTED NOT DETECTED Final   Escherichia coli NOT DETECTED NOT DETECTED Final   Klebsiella aerogenes NOT DETECTED NOT DETECTED Final   Klebsiella oxytoca NOT DETECTED NOT DETECTED Final   Klebsiella pneumoniae NOT DETECTED NOT DETECTED Final   Proteus species NOT DETECTED NOT DETECTED Final   Salmonella species NOT DETECTED NOT DETECTED Final   Serratia marcescens NOT DETECTED NOT DETECTED Final   Haemophilus influenzae NOT DETECTED NOT DETECTED Final   Neisseria meningitidis NOT DETECTED NOT DETECTED Final   Pseudomonas aeruginosa NOT DETECTED NOT DETECTED Final   Stenotrophomonas maltophilia NOT DETECTED NOT DETECTED Final   Candida albicans NOT DETECTED NOT DETECTED Final   Candida auris NOT DETECTED NOT DETECTED Final   Candida glabrata NOT DETECTED NOT DETECTED Final   Candida krusei NOT DETECTED NOT DETECTED Final   Candida parapsilosis NOT DETECTED NOT DETECTED Final   Candida tropicalis NOT DETECTED NOT DETECTED Final   Cryptococcus neoformans/gattii NOT DETECTED NOT DETECTED Final   Methicillin resistance mecA/C DETECTED (A) NOT DETECTED Final    Comment: CRITICAL RESULT CALLED TO, READ BACK BY AND VERIFIED WITH: MAYA RONAL EARLYNE [id number] @ 1533 FH Performed at [facility], [address], [place], [place] [address]   Resp panel by RT-PCR (RSV, Flu A&B, Covid) Anterior Nasal Swab     Status: None   Collection Time: 03/13/24 11:56 PM   Specimen: Anterior Nasal Swab  Result Value Ref Range Status  SARS Coronavirus 2 by RT PCR NEGATIVE NEGATIVE Final   Influenza A by PCR NEGATIVE NEGATIVE Final   Influenza B by PCR NEGATIVE NEGATIVE Final    Comment: (NOTE) The Xpert Xpress SARS-CoV-2/FLU/RSV plus assay is intended as an aid in the diagnosis of influenza from Nasopharyngeal swab specimens and should not be used as a sole basis for treatment.  Nasal washings and aspirates are unacceptable for Xpert Xpress SARS-CoV-2/FLU/RSV testing.  Fact Sheet for Patients: bloggercourse.com  Fact Sheet for Healthcare Providers: seriousbroker.it  This test is not yet approved or cleared by the [place]  FDA and has been authorized for detection and/or diagnosis of SARS-CoV-2 by FDA under an Emergency Use Authorization (EUA). This EUA will remain in effect (meaning this test can be used) for the duration of the COVID-19 declaration under Section 564(b)(1) of the Act, 21 U.S.C. section 360bbb-3(b)(1), unless the authorization is terminated or revoked.     Resp Syncytial Virus by PCR NEGATIVE NEGATIVE Final    Comment: (NOTE) Fact Sheet for Patients: bloggercourse.com  Fact Sheet for Healthcare Providers: seriousbroker.it  This test is not yet approved or cleared by the [place]  FDA and has been authorized for detection and/or diagnosis of SARS-CoV-2 by FDA under an Emergency Use Authorization (EUA). This EUA will remain in effect (meaning this test can be used) for the duration of the COVID-19 declaration under Section 564(b)(1) of the Act, 21 U.S.C. section 360bbb-3(b)(1), unless the authorization is terminated or revoked.  Performed at [facility], [address] [place], [place] [address]   Culture, blood (routine x 2)     Status: Abnormal (Preliminary result)   Collection Time: 03/14/24  2:11 AM   Specimen: BLOOD  Result Value Ref Range Status   Specimen Description BLOOD SITE NOT SPECIFIED  Final   Special Requests   Final    BOTTLES DRAWN AEROBIC AND ANAEROBIC Blood Culture adequate volume   Culture  Setup Time   Final    GRAM POSITIVE COCCI ANAEROBIC BOTTLE ONLY CRITICAL VALUE NOTED.  VALUE IS CONSISTENT WITH PREVIOUSLY REPORTED AND CALLED VALUE.    Culture (A)  Final    COAGULASE NEGATIVE  STAPHYLOCOCCUS THE SIGNIFICANCE OF ISOLATING THIS ORGANISM FROM A SINGLE SET OF BLOOD CULTURES WHEN MULTIPLE SETS ARE DRAWN IS UNCERTAIN. PLEASE NOTIFY THE MICROBIOLOGY DEPARTMENT WITHIN ONE WEEK IF SPECIATION AND SENSITIVITIES ARE REQUIRED. Performed at [facility], [address] [place], [place] [address]    Report Status PENDING  Incomplete  MRSA Next Gen by PCR, Nasal     Status: None   Collection Time: 03/14/24  6:25 AM   Specimen: Nasal Mucosa; Nasal Swab  Result Value Ref Range Status   MRSA by PCR Next Gen NOT DETECTED NOT DETECTED Final    Comment: (NOTE) The GeneXpert MRSA Assay (FDA approved for NASAL specimens only), is one component of a comprehensive MRSA colonization surveillance program. It is not intended to diagnose MRSA infection nor to guide or monitor treatment for MRSA infections. Test performance is not FDA approved in patients less than 27 years old. Performed at [facility], [address], [place], [place] [address]   Culture, blood (Routine X 2) w Reflex to ID Panel     Status: None (Preliminary result)   Collection Time: 03/15/24  8:08 AM   Specimen: BLOOD  Result Value Ref Range Status   Specimen Description BLOOD LEFT ANTECUBITAL  Final   Special Requests   Final  BOTTLES DRAWN AEROBIC AND ANAEROBIC Blood Culture adequate volume   Culture   Final    NO GROWTH 2 DAYS Performed at [facility], [address], [place], [place] [address]    Report Status PENDING  Incomplete  Culture, blood (Routine X 2) w Reflex to ID Panel     Status: None (Preliminary result)   Collection Time: 03/15/24  8:09 AM   Specimen: BLOOD RIGHT HAND  Result Value Ref Range Status   Specimen Description BLOOD RIGHT HAND  Final   Special Requests   Final    BOTTLES DRAWN AEROBIC ONLY Blood Culture results may not be optimal due to an inadequate volume of blood received in culture bottles   Culture   Final    NO GROWTH 2 DAYS Performed at [facility], [address], [place], [place] [address]    Report Status PENDING  Incomplete  Urine Culture     Status: Abnormal   Collection Time: 03/15/24  9:00 AM   Specimen: Urine, Random  Result Value Ref Range Status   Specimen Description URINE, RANDOM  Final   Special Requests NONE Reflexed from [id number]  Final   Culture (A)  Final    <10,000 COLONIES/mL INSIGNIFICANT GROWTH Performed at [facility], [address] [place], [place] [address]    Report Status 03/16/2024 FINAL  Final         Radiology Studies: US  RENAL Result Date: 03/16/2024 EXAM: US  Retroperitoneum Complete, Renal. 03/16/2024 03:23:00 AM TECHNIQUE: Real-time ultrasonography of the retroperitoneum renal was performed. COMPARISON: CT examination of 03/14/2024. CLINICAL HISTORY: Urinary retention. FINDINGS: FINDINGS: RIGHT KIDNEY/URETER: Right kidney measures 13.0 x 6.9 x 7.0 cm with an estimated volume of 326 cc. Renal cortical thickness and echogenicity are within normal limits. No hydronephrosis. No calculus. An exophytic solid hypoechoic lesion is incompletely characterized arising from the upper medial interpolar region of the right kidney measuring at least 13 x 20 mm. This was, however, previously identified on CT examination of 05/19/2017 and demonstrates an interval decrease in size and is most in keeping with an involuting cortical cyst. LEFT KIDNEY/URETER: Left kidney measures 14.3 x 7.1 x 6.7 cm with an estimated volume of 357 cc. A 12 mm simple cortical cyst is noted within the interpolar region of the left kidney for which no follow-up imaging is recommended. There is a vague intracortical hypoechoic solid region measuring roughly 20 x 24 mm (image 21) which may represent a primary renal neoplasm. Further evaluation with pre and post contrast MRI or CT should be considered. MRI is preferred in younger patients (due to lack of ionizing radiation) and for evaluating calcified lesion(s). Exophytic simple  cortical cyst arises from the interpolar region of the left kidney. 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 No hydronephrosis. No calculus. BLADDER: The bladder is decompressed and is not visualized. IMPRESSION: 1. 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 2. Exophytic solid hypoechoic lesion in the left kidney, incompletely characterized, measuring at least 13 x 20 mm, previously identified on CT of 2019 and decreased in size, most in keeping with an involuting cortical cyst. 3. Mildly complex  septated cyst in the upper pole of the left kidney, measuring 11 x 17 mm, most in keeping with a Bosniak class 2 cyst. No follow-up imaging is specifically recommended for this lesion. Electronically signed by: Dorethia Molt MD 03/16/2024 03:37 AM EST RP Workstation: [id number]   CT HEAD WO CONTRAST ( ) Result Date: 03/15/2024 EXAM: CT HEAD WITHOUT CONTRAST 03/15/2024 03:20:00 PM TECHNIQUE: CT of the head was performed without the administration of intravenous contrast. Automated exposure control, iterative reconstruction, and/or weight based adjustment of the mA/kV was utilized to reduce the radiation dose to as low as reasonably achievable. COMPARISON: None available. CLINICAL HISTORY: Neuro deficit, acute, stroke suspected. FINDINGS: BRAIN AND VENTRICLES: A partially calcified dural based mass lesion extends from the midline to the right over the right parietal convexity measuring 20 x 17 x 14 mm. Minimal mass effect is present over the medial left parietal convexity. Mild white matter changes are within normal limits of age. Atherosclerotic calcifications are  present in the cavernous carotid arteries bilaterally and at the dural margin of both vertebral arteries. No hyperdense vessel is present. No acute hemorrhage. No evidence of acute infarct. No hydrocephalus. No other acute extra-axial collection. No midline shift. ORBITS: Bilateral lens replacements are noted. The globes and orbits are otherwise within normal limits. SINUSES: No acute abnormality. SOFT TISSUES AND SKULL: No acute soft tissue abnormality. No skull fracture. IMPRESSION: 1. No acute intracranial abnormality. 2. Partially calcified dural-based mass over the right parietal convexity measuring 20 x 17 x 14 mm with minimal mass effect over the medial left parietal convexity. Electronically signed by: Lonni Necessary MD 03/15/2024 03:28 PM EST RP Workstation: [id number]   ECHOCARDIOGRAM LIMITED Result Date: 03/15/2024    ECHOCARDIOGRAM LIMITED REPORT   Patient Name:   Curtis Hill Date of Exam: 03/15/2024 Medical Rec #:  [id number]     Height:       62.0 in Accession #:    [id number]    Weight:       386.7 lb Date of Birth:  Oct 13, 1945    BSA:          2.530 m Patient Age:    77 years      BP:           86/70 mmHg Patient Gender: M             HR:           100 bpm. Exam Location:  Inpatient Procedure: Limited Echo (Both Spectral and Color Flow Doppler were utilized            during procedure). Indications:    limited LV funtion  History:        Patient has prior history of Echocardiogram examinations, most                 recent 03/15/2024. CAD; Risk Factors:Hypertension.  Sonographer:    Carmelita Hartshorn RDCS, FE, PE Referring Phys: [id number] DIPTI PLEAS  Sonographer Comments: Patient is obese and Technically difficult study due to poor echo windows. Limited echo to asses LV apex with Definity  IMPRESSIONS  1. LV apex akinesis with swirling contrast suggestive of early thrombus formation. Left ventricular ejection fraction, by estimation, is 60 to 65%. The left ventricle has normal function. FINDINGS   Left Ventricle: LV apex akinesis with swirling contrast suggestive of early thrombus formation. Left ventricular ejection fraction, by estimation, is 60 to 65%. The left ventricle has normal function. Joelle Cedars Tonleu Electronically signed by Joelle Cedars Tonleu  Signature Date/Time: 03/15/2024/3:18:46 PM    Final            LOS: 3 days   Time spent= 35 mins    Deliliah Room, MD [facility]  If 7PM-7AM, please contact night-coverage  03/17/2024, 11:28 AM

## 2024-03-17 NOTE — Plan of Care (Signed)

## 2024-03-18 DIAGNOSIS — A419 Sepsis, unspecified organism: Secondary | ICD-10-CM | POA: Diagnosis not present

## 2024-03-18 DIAGNOSIS — N390 Urinary tract infection, site not specified: Secondary | ICD-10-CM | POA: Diagnosis not present

## 2024-03-18 LAB — GLUCOSE, CAPILLARY
Glucose-Capillary: 103 mg/dL — ABNORMAL HIGH (ref 70–99)
Glucose-Capillary: 103 mg/dL — ABNORMAL HIGH (ref 70–99)
Glucose-Capillary: 115 mg/dL — ABNORMAL HIGH (ref 70–99)
Glucose-Capillary: 119 mg/dL — ABNORMAL HIGH (ref 70–99)
Glucose-Capillary: 98 mg/dL (ref 70–99)

## 2024-03-18 LAB — BASIC METABOLIC PANEL WITH GFR
Anion gap: 3 — ABNORMAL LOW (ref 5–15)
BUN: 19 mg/dL (ref 8–23)
CO2: 30 mmol/L (ref 22–32)
Calcium: 8.6 mg/dL — ABNORMAL LOW (ref 8.9–10.3)
Chloride: 99 mmol/L (ref 98–111)
Creatinine, Ser: 1.27 mg/dL — ABNORMAL HIGH (ref 0.61–1.24)
GFR, Estimated: 58 mL/min — ABNORMAL LOW (ref >60)
Glucose, Bld: 95 mg/dL (ref 70–99)
Potassium: 3.4 mmol/L — ABNORMAL LOW (ref 3.5–5.1)
Sodium: 132 mmol/L — ABNORMAL LOW (ref 135–145)

## 2024-03-18 MED ORDER — POTASSIUM CHLORIDE CRYS ER 20 MEQ PO TBCR
20.0000 meq | EXTENDED_RELEASE_TABLET | Freq: Once | ORAL | Status: AC
  Administered 2024-03-18: 20 meq via ORAL
  Filled 2024-03-18: qty 1

## 2024-03-18 NOTE — Progress Notes (Signed)
 PROGRESS NOTE    AMIN FORNWALT  FMW:[id number] DOB: Feb 01, 1946 DOA: 03/13/2024 PCP: Okey Carlin Redbird, MD   Brief Narrative:  78 yo male presented with penile discharge that was bloody. Pt has h/o chronic cystitis/prostate cancer and was recently admitted in August with hemorrhagic/emphysematous cystitis. During that hospitalization urology was required for foley insertion.  Managed for septic shock, secondary to UTI (resolved now), AKI (Improving) Off of levophed  Urology on board for foley management. Transferred to [facility] on 12/5. Lives at home with his wife and son and has a nurse, adult, power WC and HHS.   Assessment & Plan:  Principal Problem:   Sepsis due to urinary tract infection (HCC) Active Problems:   Hypotensive episode   BMI 70 and over, adult (HCC)   Hyperlipidemia LDL goal <70   CAD (coronary artery disease)   Pulmonary HTN (HCC)   Prediabetes   AKI (acute kidney injury)   OSA (obstructive sleep apnea)   Hemorrhagic cystitis   Acute cystitis   Benign essential HTN   Debility   Essential hypertension   Thoracic aortic aneurysm (TAA)   Vitamin D deficiency   Hematuria   Septic Shock secondary to  presumed UTI from from bladder outlet obstruction and Gram+ Bacteremia   - Blood cultures 12/1 > streptococcus mitis/oralis, Staphylococcus capitis (1/4) > susceptibilities pending.  - Shock has resolved and he is off of levophed  now -Continue with IV Cefepime  2 g Q12H, transition to oral antibiotic on discharge. 7 day course would be sufficient.    History of CAD: No chest pain  HLD  Continue statin   Hydronephrosis 2/2 bladder outlet obstruction, foley catheter  Chronic cystitis Hx of prostate cancer s/p EBRT and radioactive seed placement (2012)  AKI, Cr today is 1.3 12/4 > Renal US  > Exophytic solid hypoechoic lesion in the left kidney decreased in size compared 2019; Mildly complex septated cyst in the upper pole of the left kidney.  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   Normocytic anemia Hgb 7.3 (9.1), no hematuria noted  - Transfuse if Hgb < 7    Bronchial asthma OSA ,not on CPAP at home Chronic hypoxic respiratory failure on 3 L of oxygen  RA, breathing comfortably, no wheezing or crackles noted. -PRN supplemental oxygen  to maintain SpO2 between 88-92% - PRN Atrovent     History of pulmonary hypertension Essential HTN  - Hold home lopressor  25 BID, lasix  40 mg daily, valsartan 320 mg daily  - Can start resuming once blood pressure improves and continue function improves   GERD :continue PPI   Prediabetes > SSI sensitive   Physical deconditioning: home health PT/OT   Class III obesity: as evidenced by BMI  of 62.6. Likely needs out patient evaluation for MBS  Disposition: Home with his wife and son. Active with HHS.   DVT prophylaxis: heparin  injection 5,000 Units Start: 03/16/24 0600 Place and maintain sequential compression device Start: 03/14/24 0759 Place TED hose Start: 03/14/24 0738 SCDs Start: 03/14/24 0737     Code Status: Full Code Family Communication:  none at the bedside Status is: Inpatient Remains inpatient appropriate because: UTI, AKI    Subjective:  He said  that his son will be coming tomorrow and get education on foley care. He also wants to get in touch with his aide via home health. She comes to his house Monday through Friday so he wants to go home either late Sunday or Monday morning.  Examination:  General exam: Appears calm and comfortable  Respiratory system: Clear to auscultation. Respiratory effort normal. Cardiovascular system: S1 & S2 heard, RRR. No JVD, murmurs, rubs, gallops or clicks. No pedal edema. Gastrointestinal system: Abdomen is nondistended, soft and nontender. No organomegaly or masses felt. Normal bowel sounds heard. Central nervous system: Alert and oriented. No focal neurological deficits. Extremities: Symmetric 5 x 5 power. Skin: No rashes, lesions or ulcers Psychiatry: Judgement and insight appear normal. Mood & affect appropriate.        Diet Orders (From admission, onward)     Start     Ordered   03/14/24 0737  Diet regular Room service appropriate? Yes; Fluid consistency: Thin  Diet effective now       Question Answer Comment  Room service appropriate? Yes   Fluid consistency: Thin      12 /02/25 0744            Objective: Vitals:   03/17/24 1707 03/17/24 1944 03/18/24 0410 03/18/24 0817  BP: 107/68 98/63 90/61  109/70  Pulse: 80 91 82 96  Resp:  18 18 20   Temp: 98.3 F (36.8 C) 98 F (36.7 C) 98.4 F (36.9 C) 97.9 F (36.6 C)  TempSrc: Oral Oral Oral Oral  SpO2: 98% 99% 96% 98%  Weight:      Height:        Intake/Output Summary (Last 24 hours) at 03/18/2024 1024 Last data filed at 03/18/2024 0600 Gross per 24 hour  Intake 1143 ml  Output 2575 ml  Net -1432 ml   Filed Weights   03/13/24 2126 03/16/24 0500 03/17/24 0500  Weight: (!) 175.4 kg (!) 152.9 kg (!) 154.4 kg    Scheduled Meds:  allopurinol   300 mg Oral Daily   atorvastatin   20 mg Oral QPM   bisacodyl   10 mg Oral Daily   Chlorhexidine  Gluconate Cloth  6 each Topical Daily   docusate sodium   100 mg Oral BID    gabapentin   100 mg Oral TID   heparin  injection (subcutaneous)  5,000 Units Subcutaneous Q8H   insulin  aspart  0-9 Units Subcutaneous TID AC & HS   lidocaine   1 Application Topical Once   metoprolol  tartrate  12.5 mg Oral BID   polyethylene glycol  17 g Oral Daily   potassium chloride   20 mEq Oral Once   sodium chloride  flush  10-40 mL Intracatheter Q12H   sodium chloride  flush  3 mL Intravenous Q12H   Continuous Infusions:  ceFEPime  (MAXIPIME ) IV 2 g (03/18/24 0817)    Nutritional status     Body mass index is 62.26 kg/m.  Data Reviewed:   CBC: Recent Labs  Lab 03/14/24 0135 03/14/24 0155 03/14/24 9342 03/15/24 0242 03/16/24 0413 03/17/24 0329  WBC 24.8*  --  37.7* 34.5* 16.9* 11.7*  NEUTROABS 22.4*  --   --   --   --   --   HGB 9.8* 10.5* 10.1* 9.1* 7.3* 7.6*  HCT 33.3* 31.0* 33.6* 30.0* 24.4* 25.2*  MCV 95.4  --  94.1 93.2 92.8 93.7  PLT 428*  --  425* 409* 287 282   Basic Metabolic Panel: Recent Labs  Lab 03/14/24 0145 03/14/24 0155 03/14/24 0657 03/15/24 0242 03/16/24 0413 03/17/24 0329 03/18/24 0347  NA 139   < > 137 135 132* 133* 132*  K 4.3   < > 4.9 4.3 3.8 3.5 3.4*  CL 100   < > 99 96* 96* 95* 99  CO2 28  --  27 28 29 29 30   GLUCOSE 108*   < > 109* 142* 92 98 95  BUN 32*   < > 37* 33* 29* 24* 19  CREATININE 1.45*   < > 1.79* 1.66* 1.45* 1.31* 1.27*  CALCIUM  8.7*  --  8.9 8.8* 8.4* 8.6* 8.6*  MG 1.4*  --  1.3* 1.6*  --  2.0  --   PHOS  --   --  3.1 3.1  --   --   --    < > = values in this interval not displayed.   GFR: Estimated Creatinine Clearance: 65.1 mL/min (A) (by C-G formula based on SCr of 1.27 mg/dL (H)). Liver Function Tests: Recent Labs  Lab 03/14/24 0145 03/15/24 0242 03/16/24 0413  AST 20 17 22   ALT 13 13 17   ALKPHOS 74 66 67  BILITOT 0.6 0.5 0.7  PROT 6.9 6.3* 5.5*  ALBUMIN  2.1* 1.8* 1.5*   No results for input(s): LIPASE, AMYLASE in the last 168 hours. No results for input(s): AMMONIA in the last 168  hours. Coagulation Profile: Recent Labs  Lab 03/13/24 2356 03/15/24 0242  INR 1.0 1.3*   Cardiac Enzymes: No results for input(s): CKTOTAL, CKMB, CKMBINDEX, TROPONINI in the last 168 hours. BNP (last 3 results) No results for input(s): PROBNP in the last 8760 hours. HbA1C: No results for input(s): HGBA1C in the last 72 hours. CBG: Recent Labs  Lab 03/17/24 1117 03/17/24 1705 03/17/24 1946 03/18/24 0001 03/18/24 0834  GLUCAP 113* 98 110* 103* 119*   Lipid Profile: No results for input(s): CHOL, HDL, LDLCALC, TRIG, CHOLHDL, LDLDIRECT in the last 72 hours. Thyroid  Function Tests: No results for input(s): TSH, T4TOTAL, FREET4, T3FREE, THYROIDAB in the last 72 hours. Anemia Panel: No results for input(s): VITAMINB12, FOLATE, FERRITIN, TIBC, IRON, RETICCTPCT in the last 72 hours. Sepsis Labs: Recent Labs  Lab 03/14/24 9342 03/14/24 0952 03/14/24 1445 03/14/24 1625  LATICACIDVEN 2.2* 2.4* 1.7 1.9    Recent Results (from the past 240 hours)  Culture, blood (routine x 2)     Status: Abnormal   Collection Time: 03/13/24 10:52 PM   Specimen: BLOOD RIGHT HAND  Result Value Ref Range Status   Specimen Description BLOOD RIGHT HAND  Final   Special Requests   Final    BOTTLES DRAWN AEROBIC ONLY Blood Culture results may not be optimal due to an inadequate volume of blood received in culture bottles   Culture  Setup Time   Final    GRAM POSITIVE COCCI IN CHAINS AEROBIC BOTTLE ONLY Organism ID to follow CRITICAL RESULT CALLED TO, READ BACK BY AND VERIFIED WITH: PHARMD A. EARLYNE [id number] @ 1533 FH    Culture (A)  Final    STREPTOCOCCUS VESTIBULARIS STREPTOCOCCUS MITIS/ORALIS STAPHYLOCOCCUS  CAPITIS THE SIGNIFICANCE OF ISOLATING THIS ORGANISM FROM A SINGLE SET OF BLOOD CULTURES WHEN MULTIPLE SETS ARE DRAWN IS UNCERTAIN. PLEASE NOTIFY THE MICROBIOLOGY DEPARTMENT WITHIN ONE WEEK IF SPECIATION AND SENSITIVITIES ARE REQUIRED. CALL  MICROBIOLOGY LAB IF SUSCEPTIBILITIES ARE REQUIRED ON STREPTOCOCCUS MITIS/ORALIS Performed at [facility], [address], [place], [place] [address]    Report Status 03/17/2024 FINAL  Final   Organism ID, Bacteria STREPTOCOCCUS VESTIBULARIS  Final      Susceptibility   Streptococcus vestibularis - MIC*    PENICILLIN 0.25 INTERMEDIATE Intermediate     CEFTRIAXONE  <=0.12 SENSITIVE Sensitive     ERYTHROMYCIN <=0.12 SENSITIVE Sensitive     LEVOFLOXACIN  2 SENSITIVE Sensitive     VANCOMYCIN  1 SENSITIVE Sensitive     * STREPTOCOCCUS VESTIBULARIS  Blood Culture ID Panel (Reflexed)     Status: Abnormal   Collection Time: 03/13/24 10:52 PM  Result Value Ref Range Status   Enterococcus faecalis NOT DETECTED NOT DETECTED Final   Enterococcus Faecium NOT DETECTED NOT DETECTED Final   Listeria monocytogenes NOT DETECTED NOT DETECTED Final   Staphylococcus species DETECTED (A) NOT DETECTED Final    Comment: CRITICAL RESULT CALLED TO, READ BACK BY AND VERIFIED WITH: PHARMD A. EARLYNE [id number] @ 1533 FH    Staphylococcus aureus (BCID) NOT DETECTED NOT DETECTED Final   Staphylococcus epidermidis DETECTED (A) NOT DETECTED Final    Comment: Methicillin (oxacillin) resistant coagulase negative staphylococcus. Possible blood culture contaminant (unless isolated from more than one blood culture draw or clinical case suggests pathogenicity). No antibiotic treatment is indicated for blood  culture contaminants. CRITICAL RESULT CALLED TO, READ BACK BY AND VERIFIED WITH: PHARMD A. EARLYNE [id number] @ 1533 FH    Staphylococcus lugdunensis NOT DETECTED NOT DETECTED Final   Streptococcus species DETECTED (A) NOT DETECTED Final    Comment: Not Enterococcus species, Streptococcus agalactiae, Streptococcus pyogenes, or Streptococcus pneumoniae. CRITICAL RESULT CALLED TO, READ BACK BY AND VERIFIED WITH: PHARMD A. EARLYNE [id number] @ 1533 FH    Streptococcus agalactiae NOT DETECTED NOT DETECTED Final    Streptococcus pneumoniae NOT DETECTED NOT DETECTED Final   Streptococcus pyogenes NOT DETECTED NOT DETECTED Final   A.calcoaceticus-baumannii NOT DETECTED NOT DETECTED Final   Bacteroides fragilis NOT DETECTED NOT DETECTED Final   Enterobacterales NOT DETECTED NOT DETECTED Final   Enterobacter cloacae complex NOT DETECTED NOT DETECTED Final   Escherichia coli NOT DETECTED NOT DETECTED Final   Klebsiella aerogenes NOT DETECTED NOT DETECTED Final   Klebsiella oxytoca NOT DETECTED NOT DETECTED Final   Klebsiella pneumoniae NOT DETECTED NOT DETECTED Final   Proteus species NOT DETECTED NOT DETECTED Final   Salmonella species NOT DETECTED NOT DETECTED Final   Serratia marcescens NOT DETECTED NOT DETECTED Final   Haemophilus influenzae NOT DETECTED NOT DETECTED Final   Neisseria meningitidis NOT DETECTED NOT DETECTED Final   Pseudomonas aeruginosa NOT DETECTED NOT DETECTED Final   Stenotrophomonas maltophilia NOT DETECTED NOT DETECTED Final   Candida albicans NOT DETECTED NOT DETECTED Final   Candida auris NOT DETECTED NOT DETECTED Final   Candida glabrata NOT DETECTED NOT DETECTED Final   Candida krusei NOT DETECTED NOT DETECTED Final   Candida parapsilosis NOT DETECTED NOT DETECTED Final   Candida tropicalis NOT DETECTED NOT DETECTED Final   Cryptococcus neoformans/gattii NOT DETECTED NOT DETECTED Final   Methicillin resistance mecA/C DETECTED (A) NOT DETECTED Final    Comment: CRITICAL RESULT CALLED TO, READ BACK BY AND VERIFIED WITH: PHARMD ASABRA EARLYNE [id number] @ 1533 FH Performed  at [facility] Lab, [address], [place], [place] [address]   Resp panel by RT-PCR (RSV, Flu A&B, Covid) Anterior Nasal Swab     Status: None   Collection Time: 03/13/24 11:56 PM   Specimen: Anterior Nasal Swab  Result Value Ref Range Status   SARS Coronavirus 2 by RT PCR NEGATIVE NEGATIVE Final   Influenza A by PCR NEGATIVE NEGATIVE Final   Influenza B by PCR NEGATIVE NEGATIVE Final    Comment:  (NOTE) The Xpert Xpress SARS-CoV-2/FLU/RSV plus assay is intended as an aid in the diagnosis of influenza from Nasopharyngeal swab specimens and should not be used as a sole basis for treatment. Nasal washings and aspirates are unacceptable for Xpert Xpress SARS-CoV-2/FLU/RSV testing.  Fact Sheet for Patients: bloggercourse.com  Fact Sheet for Healthcare Providers: seriousbroker.it  This test is not yet approved or cleared by the [place]  FDA and has been authorized for detection and/or diagnosis of SARS-CoV-2 by FDA under an Emergency Use Authorization (EUA). This EUA will remain in effect (meaning this test can be used) for the duration of the COVID-19 declaration under Section 564(b)(1) of the Act, 21 U.S.C. section 360bbb-3(b)(1), unless the authorization is terminated or revoked.     Resp Syncytial Virus by PCR NEGATIVE NEGATIVE Final    Comment: (NOTE) Fact Sheet for Patients: bloggercourse.com  Fact Sheet for Healthcare Providers: seriousbroker.it  This test is not yet approved or cleared by the [place]  FDA and has been authorized for detection and/or diagnosis of SARS-CoV-2 by FDA under an Emergency Use Authorization (EUA). This EUA will remain in effect (meaning this test can be used) for the duration of the COVID-19 declaration under Section 564(b)(1) of the Act, 21 U.S.C. section 360bbb-3(b)(1), unless the authorization is terminated or revoked.  Performed at [facility], [address], [place], [place] [address]   Culture, blood (routine x 2)     Status: Abnormal (Preliminary result)   Collection Time: 03/14/24  2:11 AM   Specimen: BLOOD  Result Value Ref Range Status   Specimen Description BLOOD SITE NOT SPECIFIED  Final   Special Requests   Final    BOTTLES DRAWN AEROBIC AND ANAEROBIC Blood Culture adequate volume   Culture  Setup Time    Final    GRAM POSITIVE COCCI ANAEROBIC BOTTLE ONLY CRITICAL VALUE NOTED.  VALUE IS CONSISTENT WITH PREVIOUSLY REPORTED AND CALLED VALUE.    Culture (A)  Final    COAGULASE NEGATIVE STAPHYLOCOCCUS THE SIGNIFICANCE OF ISOLATING THIS ORGANISM FROM A SINGLE SET OF BLOOD CULTURES WHEN MULTIPLE SETS ARE DRAWN IS UNCERTAIN. PLEASE NOTIFY THE MICROBIOLOGY DEPARTMENT WITHIN ONE WEEK IF SPECIATION AND SENSITIVITIES ARE REQUIRED. Performed at [facility], [address], [place], [place] [address]    Report Status PENDING  Incomplete  MRSA Next Gen by PCR, Nasal     Status: None   Collection Time: 03/14/24  6:25 AM   Specimen: Nasal Mucosa; Nasal Swab  Result Value Ref Range Status   MRSA by PCR Next Gen NOT DETECTED NOT DETECTED Final    Comment: (NOTE) The GeneXpert MRSA Assay (FDA approved for NASAL specimens only), is one component of a comprehensive MRSA colonization surveillance program. It is not intended to diagnose MRSA infection nor to guide or monitor treatment for MRSA infections. Test performance is not FDA approved in patients less than 48 years old. Performed at [facility], [address], [place], [place] [address]   Culture, blood (Routine  X 2) w Reflex to ID Panel     Status: None (Preliminary result)   Collection Time: 03/15/24  8:08 AM   Specimen: BLOOD  Result Value Ref Range Status   Specimen Description BLOOD LEFT ANTECUBITAL  Final   Special Requests   Final    BOTTLES DRAWN AEROBIC AND ANAEROBIC Blood Culture adequate volume   Culture   Final    NO GROWTH 3 DAYS Performed at [facility], [address] [place], [place] [address]    Report Status PENDING  Incomplete  Culture, blood (Routine X 2) w Reflex to ID Panel     Status: None (Preliminary result)   Collection Time: 03/15/24  8:09 AM   Specimen: BLOOD RIGHT HAND  Result Value Ref Range Status   Specimen Description BLOOD RIGHT HAND  Final   Special Requests   Final    BOTTLES DRAWN  AEROBIC ONLY Blood Culture results may not be optimal due to an inadequate volume of blood received in culture bottles   Culture   Final    NO GROWTH 3 DAYS Performed at [facility], [address] [place], [place] [address]    Report Status PENDING  Incomplete  Urine Culture     Status: Abnormal   Collection Time: 03/15/24  9:00 AM   Specimen: Urine, Random  Result Value Ref Range Status   Specimen Description URINE, RANDOM  Final   Special Requests NONE Reflexed from [id number]  Final   Culture (A)  Final    <10,000 COLONIES/mL INSIGNIFICANT GROWTH Performed at [facility], [address], [place], [place] [address]    Report Status 03/16/2024 FINAL  Final         Radiology Studies: No results found.          LOS: 4 days   Time spent=37 mins    Deliliah Room, MD [facility]  If 7PM-7AM, please contact night-coverage  03/18/2024, 10:24 AM

## 2024-03-19 LAB — CBC WITH DIFFERENTIAL/PLATELET
Abs Immature Granulocytes: 0.53 K/uL — ABNORMAL HIGH (ref 0.00–0.07)
Basophils Absolute: 0.1 K/uL (ref 0.0–0.1)
Basophils Relative: 1 %
Eosinophils Absolute: 0.4 K/uL (ref 0.0–0.5)
Eosinophils Relative: 4 %
HCT: 25.6 % — ABNORMAL LOW (ref 39.0–52.0)
Hemoglobin: 7.6 g/dL — ABNORMAL LOW (ref 13.0–17.0)
Immature Granulocytes: 5 %
Lymphocytes Relative: 20 %
Lymphs Abs: 2.1 K/uL (ref 0.7–4.0)
MCH: 28.4 pg (ref 26.0–34.0)
MCHC: 29.7 g/dL — ABNORMAL LOW (ref 30.0–36.0)
MCV: 95.5 fL (ref 80.0–100.0)
Monocytes Absolute: 0.8 K/uL (ref 0.1–1.0)
Monocytes Relative: 8 %
Neutro Abs: 6.7 K/uL (ref 1.7–7.7)
Neutrophils Relative %: 62 %
Platelets: 349 K/uL (ref 150–400)
RBC: 2.68 MIL/uL — ABNORMAL LOW (ref 4.22–5.81)
RDW: 18.8 % — ABNORMAL HIGH (ref 11.5–15.5)
WBC: 10.6 K/uL — ABNORMAL HIGH (ref 4.0–10.5)
nRBC: 0 % (ref 0.0–0.2)

## 2024-03-19 LAB — GLUCOSE, CAPILLARY
Glucose-Capillary: 94 mg/dL (ref 70–99)
Glucose-Capillary: 101 mg/dL — ABNORMAL HIGH (ref 70–99)
Glucose-Capillary: 87 mg/dL (ref 70–99)
Glucose-Capillary: 92 mg/dL (ref 70–99)

## 2024-03-19 LAB — BASIC METABOLIC PANEL WITH GFR
Anion gap: 6 (ref 5–15)
BUN: 15 mg/dL (ref 8–23)
CO2: 28 mmol/L (ref 22–32)
Calcium: 8.3 mg/dL — ABNORMAL LOW (ref 8.9–10.3)
Chloride: 105 mmol/L (ref 98–111)
Creatinine, Ser: 1.02 mg/dL (ref 0.61–1.24)
GFR, Estimated: >60 mL/min (ref >60)
Glucose, Bld: 91 mg/dL (ref 70–99)
Potassium: 3.7 mmol/L (ref 3.5–5.1)
Sodium: 139 mmol/L (ref 135–145)

## 2024-03-19 LAB — CULTURE, BLOOD (ROUTINE X 2)
Culture  Setup Time: NO GROWTH
Special Requests: ADEQUATE

## 2024-03-19 NOTE — Progress Notes (Signed)
 OT Cancellation Note  Patient Details Name: Curtis Hill MRN: [id number] DOB: 24-Apr-1945   Cancelled Treatment:    Reason Eval/Treat Not Completed: Other (comment). Pt. Greeted while in bed with son present.  Attempted skilled OT treatment session.  Pt. Reports that he will not be participating in therapy today as he has d/c pending.  Pt. Also states he is unable to participate secondary to having a central line.  Reviewed with pt. And son that current OT tx plan/goals are able to be completed with a central line in place.  Pt. Remained with his decision not to participate today.  No other needs/questions indicated from pt. Or son.  OT team member to cont. With therapy attempts 1st part of the week.      CHRISTELLA Nest Lorraine-COTA/L  03/19/2024, 11:22 AM

## 2024-03-19 NOTE — TOC Transition Note (Signed)
 Transition of Care [facility]) - Discharge Note   Patient Details  Name: Curtis Hill MRN: [id number] Date of Birth: 1946/04/08  [facility] CM/SW Contact:  Robynn Eileen Hoose, RN Phone Number: 03/19/2024, 11:38 AM   Clinical Narrative:   Spoke with attending regarding patient discharge needs. Spoke with Wife, she is prepared for patient to be discharged home tomorrow.  She needs to get the private duty caregiver notice to be at the house when the patient arrives.  Wife confirmed patient has all needed DME.  HH services prior to this admission included PT OT HHA & RN.  Attending aware of need for resumption orders for the [facility] services.    Patient will need PTAR for transportation home. Wife will need to know as much in advance, as possible, of when pt will be discharging home.      Barriers to Discharge: Continued Medical Work up   Patient Goals and CMS Choice            Discharge Placement                       Discharge Plan and Services Additional resources added to the After Visit Summary for                                       Social Drivers of Health (SDOH) Interventions SDOH Screenings   Food Insecurity: No Food Insecurity (03/14/2024)  Housing: Low Risk  (03/14/2024)  Transportation Needs: Unmet Transportation Needs (03/14/2024)  Utilities: Not At Risk (03/14/2024)  Depression (PHQ2-9): Medium Risk (05/23/2020)  Social Connections: Moderately Integrated (03/14/2024)  Tobacco Use: Low Risk  (03/13/2024)     Readmission Risk Interventions     No data to display

## 2024-03-19 NOTE — Progress Notes (Signed)
  Progress Note   Patient: Curtis Hill FMW:[id number] DOB: 06/26/1945 DOA: 03/13/2024     5 DOS: the patient was seen and examined on 03/19/2024 at 9:53AM      Brief hospital course: 78 y.o. 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  Urology consulted.     Assessment and Plan: Septic Shock secondary to  presumed UTI from from bladder outlet obstruction and Gram+ Bacteremia  -Continue cefepime , day 7 of 7 - Discussed with ID, blood cultures are contaminant only, no further treatment or workup required     Coronary artery disease  Hyperlipidemia Hypertension Blood pressure normal - Continue Lipitor, metoprolol  - Hold home aspirin , Lasix , valsartan  Gout - Continue allopurinol    Hydronephrosis 2/2 bladder outlet obstruction, foley catheter  Chronic cystitis Hx of prostate cancer s/p EBRT and radioactive seed placement (2012)  AKI  Creatinine is normalized - Hold valsartan for now -Follow-up with urology - Foley at discharge   Intracortical solid region ~ 20 x 24 mm on Left Kidney  Unable to obtain MRI due to body habitus - Follow-up with urology   Possibly abnormal echocardiogram Initially there was some concern for LV thrombus, but Cardiology were consulted and specifically felt this was unlikely.   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   Normocytic anemia Some gross hematuria was noted on admission, but resolved quickly.  Similar to his admission in August, he had a marked drop in hemoglobin, which is probably combination of hematuria and dilution. - Follow-up with PCP   Asthma OSA, non-adherent to CPAP at home Chronic hypoxic  respiratory failure on 3 L of oxygen  Pulmonary hypertension Obesity hypoventilation syndrome Resolved to baseline  Class 3 obesity Complicates care          Subjective: No new complaints, no nursing concerns.  No hematuria.     Physical Exam: BP 113/74   Pulse 76   Temp 98.3 F (36.8 C) (Oral)   Resp 18   Ht 5' 2 (1.575 m)   Wt (!) 191.4 kg   SpO2 95%   BMI 77.18 kg/m   Adult male, lying in bed, interactive and appropriate RRR, no murmurs, no peripheral edema Respiratory normal, lungs clear without rales or wheezes Abdomen soft, no tenderness palpation or guarding, no ascites or distention Attention normal, affect normal, judgment and insight appear normal   Data Reviewed: Basic metabolic panel shows complete normalization CBC shows resolution of leukocytosis, hemoglobin stable at 7.6    Family Communication:     Disposition: Status is: Inpatient         Author: Lonni SHAUNNA Dalton, MD 03/19/2024 1:53 PM  For on call review [email or web address].

## 2024-03-20 LAB — CULTURE, BLOOD (ROUTINE X 2)
Culture: NO GROWTH
Culture: NO GROWTH
Special Requests: ADEQUATE

## 2024-03-20 LAB — GLUCOSE, CAPILLARY
Glucose-Capillary: 89 mg/dL (ref 70–99)
Glucose-Capillary: 98 mg/dL (ref 70–99)

## 2024-03-20 MED ORDER — METOPROLOL TARTRATE 25 MG PO TABS: 12.5000 mg | ORAL_TABLET | Freq: Two times a day (BID) | ORAL | Status: AC

## 2024-03-20 MED ORDER — FUROSEMIDE 40 MG PO TABS: 20.0000 mg | ORAL_TABLET | Freq: Every day | ORAL | Status: AC

## 2024-03-20 MED ORDER — ALUM & MAG HYDROXIDE-SIMETH 200-200-20 MG/5ML PO SUSP
15.0000 mL | ORAL | Status: DC | PRN
  Administered 2024-03-20: 15 mL via ORAL
  Filled 2024-03-20: qty 30

## 2024-03-20 NOTE — Discharge Summary (Signed)
 Physician Discharge Summary   Patient: Curtis Hill MRN: [id number] DOB: 1946/02/07  Admit date:     03/13/2024  Discharge date: 03/20/24  Discharge Physician: Lonni SHAUNNA Dalton   PCP: Okey Carlin Redbird, MD     Recommendations at discharge:  Follow up with PCP Dr. Okey within 1 week for UTI sepsis Follow up with Urology Dr. Carolee within 1 week for indwelling foley catheter and renal mass Dr. Okey: Please check CBC and BMP in 1 week Please resume home dose metoprolol  and furosemide  and resume valsartan if/when appropriate Please trend CBC and evaluate for iron deficiency if appropriate  Dr. Carolee: Please follow 2cm left kidney abnormality, unable to MRI in hospital  [facility]: Obtain complete blood count and basic metabolic panel in 1 week and fax to Dr. Okey at [facility] [place]     Discharge Diagnoses: Principal Problem:   Septic shock due to UTI, due to bladder obstruction, unknown organism Active Problems:   Coronary artery disease   Hyperlipidemia   Chronic hypertension   Gout   Hydronephrosis secondary to bladder outlet obstruction   Chronic cystitis   History of prostate cancer   Acute renal failure   Left kidney mass   Hyponatremia   Parietal calcification on CT head   Normocytic anemia   Asthma   Sleep apnea obstructive   Chronic hypoxic respiratory failure   Pulmonary hypertension   Obesity hypoventilation syndrome   Class III obesity   Gross hematuria   Vitamin D deficiency   Thoracic aortic aneurysm   Prediabetes       Hospital Course: 78 y.o. 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  Urology consulted.      Septic Shock secondary to  presumed UTI from from bladder outlet obstruction  The patient was admitted to the ICU on pressors.  Urology were  consulted, Foley required cystoscopy for placement.  He stabilized.  Urine culture not obtained prior to administration of antibiotics, and insignificant growth.  Treated with 7 days cefepime .  Blood cultures grew Streptococcus mitis, Streptococcus vestibularis, Staph epidermidis, and another 1 speciated coagulase-negative staph all in individual bottles only.  This was reviewed with infectious disease, who felt they were contaminants only, with a high confidence, required no further treatment.  WBC and fever resolved.  Completed 7 days cefepime .        Coronary artery disease  Hyperlipidemia Hypertension The patient was hypotensive on admission and required Levophed .  His blood pressures normalized, he was weaned off of Levophed , but valsartan was held at discharge, metoprolol  and furosemide  were reduced.       Hydronephrosis 2/2 bladder outlet obstruction, foley catheter  Chronic cystitis Hx of prostate cancer s/p EBRT and radioactive seed placement (2012)  AKI  The patient's creatinine was 1.5 on admission, valsartan was held, Foley was placed with cystoscopy, he was treated with fluids, and blood pressure normalized to baseline 1.0 by the time of discharge.  Recommend holding valsartan at discharge and follow-up with PCP for BMP in 1 week      Intracortical solid region ~ 20 x 24 mm on Left Kidney  Unable to obtain MRI due to body habitus - Follow-up with urology   Possibly abnormal echocardiogram Initially there was some concern for LV thrombus, but Cardiology were consulted and specifically felt  this was unlikely, recommended against anticoagulation.   Hyponatremia  Resolved   Right parietal partially calcified dural based mass This was an incidentally noted finding, neurosurgery was consulted, recommended no further workup.   Normocytic anemia Some gross hematuria was noted on admission, but resolved quickly.  Similar to his admission in August, he had a marked drop in  hemoglobin, which is probably combination of hematuria and dilution. - Follow-up with PCP   Asthma OSA, non-adherent to CPAP at home Chronic hypoxic respiratory failure on 3 L of oxygen  Pulmonary hypertension Obesity hypoventilation syndrome Resolved to baseline   Class 3 obesity Complicates care         The [place]  Controlled Substances Registry was reviewed for this patient prior to discharge.  Consultants: Critical care Cardiology Urology Neurosurgery  Disposition: Home health Diet recommendation:  Discharge Diet Orders (From admission, onward)     Start     Ordered   03/20/24 0000  Diet - low sodium heart healthy        03/20/24 0827             DISCHARGE MEDICATION: Allergies as of 03/20/2024       Reactions   Levofloxacin  Other (See Comments)   Aortic aneurysm   Lisinopril Anaphylaxis, Shortness Of Breath, Swelling, Rash   Prednisone  Swelling        Medication List     PAUSE taking these medications    aspirin  EC 81 MG tablet Wait to take this until your doctor or other care provider tells you to start again. Take 81 mg by mouth daily. Swallow whole.   valsartan 320 MG tablet Wait to take this until your doctor or other care provider tells you to start again. Commonly known as: DIOVAN Take 320 mg by mouth daily.       TAKE these medications    albuterol  108 (90 Base) MCG/ACT inhaler Commonly known as: VENTOLIN  HFA Inhale 2 puffs into the lungs every 6 (six) hours as needed for wheezing or shortness of breath.   allopurinol  300 MG tablet Commonly known as: ZYLOPRIM  Take 300 mg by mouth daily.   atorvastatin  20 MG tablet Commonly known as: LIPITOR Take 20 mg by mouth every evening.   calcium  carbonate 500 MG chewable tablet Commonly known as: TUMS - dosed in mg elemental calcium  Chew 1 tablet by mouth daily as needed for indigestion or heartburn.   Centrum Silver 50+Men Tabs Take 1 tablet by mouth in the morning.    CoQ10 200 MG Caps Take 1 capsule by mouth in the morning.   cyanocobalamin 1000 MCG tablet Commonly known as: VITAMIN B12 Take 1,000 mcg by mouth daily.   furosemide  40 MG tablet Commonly known as: LASIX  Take 0.5 tablets (20 mg total) by mouth daily. What changed: how much to take   gabapentin  100 MG capsule Commonly known as: NEURONTIN  Take 100 mg by mouth 3 (three) times daily.   metoprolol  tartrate 25 MG tablet Commonly known as: LOPRESSOR  Take 0.5 tablets (12.5 mg total) by mouth 2 (two) times daily. What changed: how much to take   nystatin  powder Commonly known as: MYCOSTATIN /NYSTOP  Apply topically 3 (three) times daily.   senna-docusate 8.6-50 MG tablet Commonly known as: Senokot-S Take 1 tablet by mouth at bedtime as needed for mild constipation. What changed:  when to take this reasons to take this   simethicone  80 MG chewable tablet Commonly known as: MYLICON Chew 80 mg by mouth daily as needed for flatulence.  traMADol  50 MG tablet Commonly known as: ULTRAM  Take 1 tablet (50 mg total) by mouth every 6 (six) hours as needed for moderate pain (pain score 4-6) or severe pain (pain score 7-10). What changed:  how much to take when to take this        Follow-up Information     [facility]) Follow up.   Specialty: Home Health Services Why: home health has been arranged. They will contact you to schedule apt within 48hrs post discharge. Contact information: [address] [place] [address] [place]  [address] [phone]        Okey Carlin Redbird, MD. Schedule an appointment as soon as possible for a visit in 1 week(s).   Specialty: Family Medicine Contact information: [address] [place] [address] [phone]         [facility]. Schedule an appointment as soon as possible for a visit.   Contact information: [address]  [place]  [address] [phone]                 Discharge Instructions     Diet - low sodium heart healthy   Complete by: As directed    Discharge instructions   Complete by: As directed    **IMPORTANT DISCHARGE INSTRUCTIONS**   From Dr. Jonel: You were admitted for severe infection and bladder obstruction The foley catheter was replaced to drain the bladder and you were treated with antibiotics.  The Urology team recommends you keep the foley catheter and come see them in their office as soon as possible.  Call [facility] to schedule  Your kidney ultrasound imaging here suggested a possible mass on the kidney; we were not able to perform imaging of this here.  Please follow up with Urology to formulate a plan for follow up of this.  For now, your blood pressure remains on the low side HOLD (do not take) your valsartan for now REDUCE your metoprolol  and furosemide  this week (take a half tab only)  Check your blood pressure daily and write it down Ask the home health nurse [facility] RN) to write down your blood pressure as well When you see Dr. Okey in 1 week, ask him if you should increase the metoprolol  and Lasix  back to regular doses and if you should resume valsartan  Also, ask the [facility] to draw a blood count and metabolic panel in 1 week and fax to Dr. Okey Hold your aspirin  (do not take) for now, until Dr. Okey says to resume.   Increase activity slowly   Complete by: As directed    No wound care   Complete by: As directed        Discharge Exam: Filed Weights   03/17/24 0500 03/19/24 0447 03/20/24 0456  Weight: (!) 154.4 kg (!) 191.4 kg (!) 190.8 kg    General: Pt is alert, awake, not in acute distress Cardiovascular: RRR, nl S1-S2, no murmurs appreciated.  No pitting edema in the extremities Respiratory: Normal respiratory rate and rhythm.  CTAB without rales or wheezes. Abdominal: Abdomen soft and non-tender.  No distension or HSM.   Neuro/Psych: Strength symmetric in upper and lower extremities.   Judgment and insight appear normal.   Condition at discharge: fair  The results of significant diagnostics from this hospitalization (including imaging, microbiology, ancillary and laboratory) are listed below for reference.   Imaging Studies: US  RENAL Result Date: 03/16/2024 EXAM: US  Retroperitoneum Complete, Renal. 03/16/2024 03:23:00 AM  TECHNIQUE: Real-time ultrasonography of the retroperitoneum renal was performed. COMPARISON: CT examination of 03/14/2024. CLINICAL HISTORY: Urinary retention. FINDINGS: FINDINGS: RIGHT KIDNEY/URETER: Right kidney measures 13.0 x 6.9 x 7.0 cm with an estimated volume of 326 cc. Renal cortical thickness and echogenicity are within normal limits. No hydronephrosis. No calculus. An exophytic solid hypoechoic lesion is incompletely characterized arising from the upper medial interpolar region of the right kidney measuring at least 13 x 20 mm. This was, however, previously identified on CT examination of 05/19/2017 and demonstrates an interval decrease in size and is most in keeping with an involuting cortical cyst. LEFT KIDNEY/URETER: Left kidney measures 14.3 x 7.1 x 6.7 cm with an estimated volume of 357 cc. A 12 mm simple cortical cyst is noted within the interpolar region of the left kidney for which no follow-up imaging is recommended. There is a vague intracortical hypoechoic solid region measuring roughly 20 x 24 mm (image 21) which may represent a primary renal neoplasm. Further evaluation with pre and post contrast MRI or CT should be considered. MRI is preferred in younger patients (due to lack of ionizing radiation) and for evaluating calcified lesion(s). Exophytic simple cortical cyst arises from the interpolar region of the left kidney. 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 No hydronephrosis. No calculus. BLADDER: The bladder is decompressed and is  not visualized. IMPRESSION: 1. 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 2. Exophytic solid hypoechoic lesion in the left kidney, incompletely characterized, measuring at least 13 x 20 mm, previously identified on CT of 2019 and decreased in size, most in keeping with an involuting cortical cyst. 3. 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 No follow-up imaging is specifically recommended for this lesion. Electronically signed by: Dorethia Molt MD 03/16/2024 03:37 AM EST RP Workstation: [id number]   CT HEAD WO CONTRAST ( ) Result Date: 03/15/2024 EXAM: CT HEAD WITHOUT CONTRAST 03/15/2024 03:20:00 PM TECHNIQUE: CT of the head was performed without the administration of intravenous contrast. Automated exposure control, iterative reconstruction, and/or weight based adjustment of the mA/kV was utilized to reduce the radiation dose to as low as reasonably achievable. COMPARISON: None available. CLINICAL HISTORY: Neuro deficit, acute, stroke suspected. FINDINGS: BRAIN AND VENTRICLES: A partially calcified dural based mass lesion extends from the midline to the right over the right parietal convexity measuring 20 x 17 x 14 mm. Minimal mass effect is present over the medial left parietal convexity. Mild white matter changes are within normal limits of age. Atherosclerotic calcifications are present in the cavernous carotid arteries bilaterally and at the dural margin of both vertebral arteries. No hyperdense vessel is present. No acute hemorrhage. No evidence of acute infarct. No hydrocephalus. No other acute extra-axial collection. No midline shift. ORBITS: Bilateral lens replacements are  noted. The globes and orbits are otherwise within normal limits. SINUSES: No acute abnormality. SOFT TISSUES AND SKULL: No acute soft tissue abnormality. No skull fracture. IMPRESSION: 1. No acute intracranial abnormality. 2. Partially calcified dural-based mass over the right parietal convexity measuring 20 x 17 x 14 mm with minimal  mass effect over the medial left parietal convexity. Electronically signed by: Lonni Necessary MD 03/15/2024 03:28 PM EST RP Workstation: [id number]   ECHOCARDIOGRAM LIMITED Result Date: 03/15/2024    ECHOCARDIOGRAM LIMITED REPORT   Patient Name:   Curtis Hill Date of Exam: 03/15/2024 Medical Rec #:  [id number]     Height:       62.0 in Accession #:    [id number]    Weight:       386.7 lb Date of Birth:  [birth year]    BSA:          2.530 m Patient Age:    77 years      BP:           86/70 mmHg Patient Gender: M             HR:           100 bpm. Exam Location:  Inpatient Procedure: Limited Echo (Both Spectral and Color Flow Doppler were utilized            during procedure). Indications:    limited LV funtion  History:        Patient has prior history of Echocardiogram examinations, most                 recent 03/15/2024. CAD; Risk Factors:Hypertension.  Sonographer:    Carmelita Hartshorn RDCS, FE, PE Referring Phys: [id number] DIPTI PLEAS  Sonographer Comments: Patient is obese and Technically difficult study due to poor echo windows. Limited echo to asses LV apex with Definity  IMPRESSIONS  1. LV apex akinesis with swirling contrast suggestive of early thrombus formation. Left ventricular ejection fraction, by estimation, is 60 to 65%. The left ventricle has normal function. FINDINGS  Left Ventricle: LV apex akinesis with swirling contrast suggestive of early thrombus formation. Left ventricular ejection fraction, by estimation, is 60 to 65%. The left ventricle has normal function. Joelle Cedars Tonleu Electronically signed by Joelle Cedars Ny Signature Date/Time: 03/15/2024/3:18:46  PM    Final    ECHOCARDIOGRAM COMPLETE Result Date: 03/15/2024    ECHOCARDIOGRAM REPORT   Patient Name:   Curtis Hill Date of Exam: 03/15/2024 Medical Rec #:  [id number]     Height:       62.0 in Accession #:    [id number]    Weight:       386.7 lb Date of Birth:  03-Jul-1945    BSA:          2.530 m Patient Age:    77 years      BP:           122/95 mmHg Patient Gender: M             HR:           112 bpm. Exam Location:  Inpatient Procedure: 2D Echo, Cardiac Doppler, Color Doppler and Intracardiac            Opacification Agent (Both Spectral and Color Flow Doppler were            utilized during procedure). Indications:    Shock R57.9  History:        Patient has prior history of Echocardiogram examinations, most                 recent 10/23/2023. CAD; Risk Factors:Hypertension and Sleep                 Apnea.  Sonographer:    Jayson Gaskins Referring Phys:  [id number] DIPTI BARAL IMPRESSIONS  1. There is possible swirling of contrast at that apex consistent of thrombus (#72, #81). Left ventricular ejection fraction, by estimation, is 55 to 60%. The left ventricle has normal function. Left ventricular endocardial border not optimally defined to evaluate regional wall motion. Indeterminate diastolic filling due to E-A fusion.  2. Right ventricular systolic function is normal. The right ventricular size is normal. Mildly increased right ventricular wall thickness.  3. The mitral valve is grossly normal. Trivial mitral valve regurgitation.  4. The aortic valve was not well visualized. Aortic valve regurgitation is not visualized. Aortic valve sclerosis/calcification is present, without any evidence of aortic stenosis.  5. Aortic dilatation noted. There is mild dilatation of the ascending aorta, measuring 42 mm. Comparison(s): Technically difficult evaluation but EF similar to prior echo. There is evidence od possible apical thrombus. Would repeat limited echo with contrast focusing at the apex. FINDINGS  Left Ventricle:  There is possible swirling of contrast at that apex consistent of thrombus (#72, #81). Left ventricular ejection fraction, by estimation, is 55 to 60%. The left ventricle has normal function. Left ventricular endocardial border not optimally defined to evaluate regional wall motion. The left ventricular internal cavity size was normal in size. Suboptimal image quality limits for assessment of left ventricular hypertrophy. Indeterminate diastolic filling due to E-A fusion. Right Ventricle: The right ventricular size is normal. Mildly increased right ventricular wall thickness. Right ventricular systolic function is normal. Left Atrium: Left atrial size was normal in size. Right Atrium: Right atrial size was normal in size. Pericardium: There is no evidence of pericardial effusion. Presence of epicardial fat layer. Mitral Valve: The mitral valve is grossly normal. Trivial mitral valve regurgitation. Tricuspid Valve: The tricuspid valve is not well visualized. Tricuspid valve regurgitation is trivial. Aortic Valve: The aortic valve was not well visualized. Aortic valve regurgitation is not visualized. Aortic valve sclerosis/calcification is present, without any evidence of aortic stenosis. Aortic valve mean gradient measures 11.0 mmHg. Aortic valve peak gradient measures 18.5 mmHg. Aortic valve area, by VTI measures 1.67 cm. Pulmonic Valve: The pulmonic valve was not well visualized. Pulmonic valve regurgitation is not visualized. Aorta: Aortic dilatation noted. There is mild dilatation of the ascending aorta, measuring 42 mm. Venous: The inferior vena cava was not well visualized. IAS/Shunts: No atrial level shunt detected by color flow Doppler.  LEFT VENTRICLE PLAX 2D LVIDd:         4.84 cm LVIDs:         3.60 cm LV PW:         1.67 cm LV IVS:        1.49 cm LVOT diam:     2.00 cm LV SV:         59 LV SV Index:   23 LVOT Area:     3.14 cm  RIGHT VENTRICLE RV S prime:     24.00 cm/s  PULMONARY VEINS TAPSE (M-mode):  3.4 cm      Systolic Velocity: 73.00 cm/s LEFT ATRIUM             Index LA Vol (A2C):   40.7 ml 16.09 ml/m LA Vol (A4C):   35.3 ml 13.95 ml/m LA Biplane Vol: 38.6 ml 15.26 ml/m  AORTIC VALVE AV Area (Vmax):    1.90 cm AV Area (Vmean):   2.04 cm AV Area (VTI):     1.67 cm AV Vmax:           215.00 cm/s AV Vmean:  157.000 cm/s AV VTI:            0.356 m AV Peak Grad:      18.5 mmHg AV Mean Grad:      11.0 mmHg LVOT Vmax:         130.00 cm/s LVOT Vmean:        102.000 cm/s LVOT VTI:          0.189 m LVOT/AV VTI ratio: 0.53  AORTA Ao Asc diam: 4.20 cm  SHUNTS Systemic VTI:  0.19 m Systemic Diam: 2.00 cm Joelle Cedars Tonleu Electronically signed by Joelle Cedars Tonleu Signature Date/Time: 03/15/2024/12:46:47 PM    Final    CT ABDOMEN PELVIS W CONTRAST Result Date: 03/14/2024 EXAM: CT ABDOMEN AND PELVIS WITH CONTRAST 03/14/2024 02:32:25 AM TECHNIQUE: CT of the abdomen and pelvis was performed with the administration of 100 mL of iohexol  (OMNIPAQUE ) 350 MG/ML injection. Multiplanar reformatted images are provided for review. Automated exposure control, iterative reconstruction, and/or weight-based adjustment of the mA/kV was utilized to reduce the radiation dose to as low as reasonably achievable. COMPARISON: Comparison with 12/06/2023. CLINICAL HISTORY: Hematuria, gross/macroscopic. FINDINGS: LOWER CHEST: No acute abnormality. LIVER: Nodular hepatic contour compatible with cirrhosis. GALLBLADDER AND BILE DUCTS: Gallbladder is unremarkable. No biliary ductal dilatation. SPLEEN: No acute abnormality. PANCREAS: No acute abnormality. ADRENAL GLANDS: No acute abnormality. KIDNEYS, URETERS AND BLADDER: Punctate nonobstructing right nephrolithiasis. New mild right and moderate left hydroureter of the distal ureters. Mild bladder wall thickening with perivesical fat stranding. This has improved compared to prior. No perinephric or periureteral stranding. GI AND BOWEL: Stomach demonstrates no acute abnormality.  There is no bowel obstruction. Normal appendix. PERITONEUM AND RETROPERITONEUM: No ascites. No free air. VASCULATURE: Aorta is normal in caliber. Aortic atherosclerotic calcification. LYMPH NODES: Unchanged prominent left paraaortic lymph nodes. For example 1.1 cm node on series 8 image 37. REPRODUCTIVE ORGANS: Brachytherapy seeds in the prostate. The penis and scrotum are not well evaluated. There is stranding in the low anterior abdominal wall pannus and perineum. BONES AND SOFT TISSUES: No acute osseous abnormality. No focal soft tissue abnormality. IMPRESSION: 1. Ongoing cystitis, though this appears improved compared to 8 / 25 / 25. 2. Mild right and moderate left distal hydroureter, new from 12/06/23, which may reflect distal ureteral partial obstruction, reflux, or inflammatory change. 3. Penis and scrotum not well evaluated. Stranding and edema in the low abdominal wall pannus and perineum. Correlate for cellulitis. 4. Cirrhosis. Electronically signed by: Norman Gatlin MD 03/14/2024 02:57 AM EST RP Workstation: [id number]   DG Chest Portable 1 View Result Date: 03/14/2024 CLINICAL DATA:  Central line placement. EXAM: PORTABLE CHEST 1 VIEW COMPARISON:  March 14, 2024 FINDINGS: The study is limited secondary to patient rotation. A right internal jugular venous catheter is seen with its distal tip suspected within the distal aspect of the superior vena cava. The cardiac silhouette is enlarged and unchanged in size. Lung volumes are noted. No acute infiltrate or pneumothorax is identified. A small right pleural effusion is suspected. The visualized skeletal structures are unremarkable. IMPRESSION: 1. Right internal jugular venous catheter positioning, as described above, without evidence of pneumothorax. 2. Cardiomegaly with a small right pleural effusion. Electronically Signed   By: Suzen Dials M.D.   On: 03/14/2024 02:03   DG Chest Port 1 View Result Date: 03/14/2024 CLINICAL DATA:  Constipation  and bleeding out of the penis. EXAM: PORTABLE CHEST 1 VIEW COMPARISON:  October 22, 2023 FINDINGS: The cardiac silhouette is mildly enlarged and unchanged in  size. Low lung volumes are noted. No acute infiltrate, pleural effusion or pneumothorax is identified. The visualized skeletal structures are unremarkable. IMPRESSION: Low lung volumes without acute or active cardiopulmonary disease. Electronically Signed   By: Suzen Dials M.D.   On: 03/14/2024 00:37    Microbiology: Results for orders placed or performed during the hospital encounter of 03/13/24  Culture, blood (routine x 2)     Status: Abnormal   Collection Time: 03/13/24 10:52 PM   Specimen: BLOOD RIGHT HAND  Result Value Ref Range Status   Specimen Description BLOOD RIGHT HAND  Final   Special Requests   Final    BOTTLES DRAWN AEROBIC ONLY Blood Culture results may not be optimal due to an inadequate volume of blood received in culture bottles   Culture  Setup Time   Final    GRAM POSITIVE COCCI IN CHAINS AEROBIC BOTTLE ONLY Organism ID to follow CRITICAL RESULT CALLED TO, READ BACK BY AND VERIFIED WITH: PHARMD A. EARLYNE [id number] @ 1533 FH    Culture (A)  Final    STREPTOCOCCUS VESTIBULARIS STREPTOCOCCUS MITIS/ORALIS STAPHYLOCOCCUS CAPITIS THE SIGNIFICANCE OF ISOLATING THIS ORGANISM FROM A SINGLE SET OF BLOOD CULTURES WHEN MULTIPLE SETS ARE DRAWN IS UNCERTAIN. PLEASE NOTIFY THE MICROBIOLOGY DEPARTMENT WITHIN ONE WEEK IF SPECIATION AND SENSITIVITIES ARE REQUIRED. CALL MICROBIOLOGY LAB IF SUSCEPTIBILITIES ARE REQUIRED ON STREPTOCOCCUS MITIS/ORALIS Performed at [facility], [address], [place], [place] [address]    Report Status 03/17/2024 FINAL  Final   Organism ID, Bacteria STREPTOCOCCUS VESTIBULARIS  Final      Susceptibility   Streptococcus vestibularis - MIC*    PENICILLIN 0.25 INTERMEDIATE Intermediate     CEFTRIAXONE  <=0.12 SENSITIVE Sensitive     ERYTHROMYCIN <=0.12 SENSITIVE Sensitive     LEVOFLOXACIN  2  SENSITIVE Sensitive     VANCOMYCIN  1 SENSITIVE Sensitive     * STREPTOCOCCUS VESTIBULARIS  Blood Culture ID Panel (Reflexed)     Status: Abnormal   Collection Time: 03/13/24 10:52 PM  Result Value Ref Range Status   Enterococcus faecalis NOT DETECTED NOT DETECTED Final   Enterococcus Faecium NOT DETECTED NOT DETECTED Final   Listeria monocytogenes NOT DETECTED NOT DETECTED Final   Staphylococcus species DETECTED (A) NOT DETECTED Final    Comment: CRITICAL RESULT CALLED TO, READ BACK BY AND VERIFIED WITH: PHARMD A. EARLYNE [id number] @ 1533 FH    Staphylococcus aureus (BCID) NOT DETECTED NOT DETECTED Final   Staphylococcus epidermidis DETECTED (A) NOT DETECTED Final    Comment: Methicillin (oxacillin) resistant coagulase negative staphylococcus. Possible blood culture contaminant (unless isolated from more than one blood culture draw or clinical case suggests pathogenicity). No antibiotic treatment is indicated for blood  culture contaminants. CRITICAL RESULT CALLED TO, READ BACK BY AND VERIFIED WITH: PHARMD A. EARLYNE [id number] @ 1533 FH    Staphylococcus lugdunensis NOT DETECTED NOT DETECTED Final   Streptococcus species DETECTED (A) NOT DETECTED Final    Comment: Not Enterococcus species, Streptococcus agalactiae, Streptococcus pyogenes, or Streptococcus pneumoniae. CRITICAL RESULT CALLED TO, READ BACK BY AND VERIFIED WITH: PHARMD A. EARLYNE [id number] @ 1533 FH    Streptococcus agalactiae NOT DETECTED NOT DETECTED Final   Streptococcus pneumoniae NOT DETECTED NOT DETECTED Final   Streptococcus pyogenes NOT DETECTED NOT DETECTED Final   A.calcoaceticus-baumannii NOT DETECTED NOT DETECTED Final   Bacteroides fragilis NOT DETECTED NOT DETECTED Final   Enterobacterales NOT DETECTED NOT DETECTED Final   Enterobacter cloacae complex NOT DETECTED NOT DETECTED Final   Escherichia coli NOT DETECTED  NOT DETECTED Final   Klebsiella aerogenes NOT DETECTED NOT DETECTED Final   Klebsiella oxytoca NOT  DETECTED NOT DETECTED Final   Klebsiella pneumoniae NOT DETECTED NOT DETECTED Final   Proteus species NOT DETECTED NOT DETECTED Final   Salmonella species NOT DETECTED NOT DETECTED Final   Serratia marcescens NOT DETECTED NOT DETECTED Final   Haemophilus influenzae NOT DETECTED NOT DETECTED Final   Neisseria meningitidis NOT DETECTED NOT DETECTED Final   Pseudomonas aeruginosa NOT DETECTED NOT DETECTED Final   Stenotrophomonas maltophilia NOT DETECTED NOT DETECTED Final   Candida albicans NOT DETECTED NOT DETECTED Final   Candida auris NOT DETECTED NOT DETECTED Final   Candida glabrata NOT DETECTED NOT DETECTED Final   Candida krusei NOT DETECTED NOT DETECTED Final   Candida parapsilosis NOT DETECTED NOT DETECTED Final   Candida tropicalis NOT DETECTED NOT DETECTED Final   Cryptococcus neoformans/gattii NOT DETECTED NOT DETECTED Final   Methicillin resistance mecA/C DETECTED (A) NOT DETECTED Final    Comment: CRITICAL RESULT CALLED TO, READ BACK BY AND VERIFIED WITH: MAYA RONAL COLLIER [id number] @ 1533 FH Performed at [facility], [address] [place], [place] [address]   Resp panel by RT-PCR (RSV, Flu A&B, Covid) Anterior Nasal Swab     Status: None   Collection Time: 03/13/24 11:56 PM   Specimen: Anterior Nasal Swab  Result Value Ref Range Status   SARS Coronavirus 2 by RT PCR NEGATIVE NEGATIVE Final   Influenza A by PCR NEGATIVE NEGATIVE Final   Influenza B by PCR NEGATIVE NEGATIVE Final    Comment: (NOTE) The Xpert Xpress SARS-CoV-2/FLU/RSV plus assay is intended as an aid in the diagnosis of influenza from Nasopharyngeal swab specimens and should not be used as a sole basis for treatment. Nasal washings and aspirates are unacceptable for Xpert Xpress SARS-CoV-2/FLU/RSV testing.  Fact Sheet for Patients: bloggercourse.com  Fact Sheet for Healthcare Providers: seriousbroker.it  This test is not yet approved or cleared  by the [place]  FDA and has been authorized for detection and/or diagnosis of SARS-CoV-2 by FDA under an Emergency Use Authorization (EUA). This EUA will remain in effect (meaning this test can be used) for the duration of the COVID-19 declaration under Section 564(b)(1) of the Act, 21 U.S.C. section 360bbb-3(b)(1), unless the authorization is terminated or revoked.     Resp Syncytial Virus by PCR NEGATIVE NEGATIVE Final    Comment: (NOTE) Fact Sheet for Patients: bloggercourse.com  Fact Sheet for Healthcare Providers: seriousbroker.it  This test is not yet approved or cleared by the [place]  FDA and has been authorized for detection and/or diagnosis of SARS-CoV-2 by FDA under an Emergency Use Authorization (EUA). This EUA will remain in effect (meaning this test can be used) for the duration of the COVID-19 declaration under Section 564(b)(1) of the Act, 21 U.S.C. section 360bbb-3(b)(1), unless the authorization is terminated or revoked.  Performed at [facility], [address], [place], [place] [address]   Culture, blood (routine x 2)     Status: Abnormal   Collection Time: 03/14/24  2:11 AM   Specimen: BLOOD  Result Value Ref Range Status   Specimen Description BLOOD SITE NOT SPECIFIED  Final   Special Requests   Final    BOTTLES DRAWN AEROBIC AND ANAEROBIC Blood Culture adequate volume   Culture  Setup Time   Final    GRAM POSITIVE COCCI ANAEROBIC BOTTLE ONLY CRITICAL VALUE NOTED.  VALUE IS CONSISTENT WITH PREVIOUSLY REPORTED AND CALLED VALUE.  Culture (A)  Final    COAGULASE NEGATIVE STAPHYLOCOCCUS THE SIGNIFICANCE OF ISOLATING THIS ORGANISM FROM A SINGLE SET OF BLOOD CULTURES WHEN MULTIPLE SETS ARE DRAWN IS UNCERTAIN. PLEASE NOTIFY THE MICROBIOLOGY DEPARTMENT WITHIN ONE WEEK IF SPECIATION AND SENSITIVITIES ARE REQUIRED. Performed at [facility], [address] [place], [place] [address]     Report Status 03/19/2024 FINAL  Final  MRSA Next Gen by PCR, Nasal     Status: None   Collection Time: 03/14/24  6:25 AM   Specimen: Nasal Mucosa; Nasal Swab  Result Value Ref Range Status   MRSA by PCR Next Gen NOT DETECTED NOT DETECTED Final    Comment: (NOTE) The GeneXpert MRSA Assay (FDA approved for NASAL specimens only), is one component of a comprehensive MRSA colonization surveillance program. It is not intended to diagnose MRSA infection nor to guide or monitor treatment for MRSA infections. Test performance is not FDA approved in patients less than 27 years old. Performed at [facility], [address], [place], [place] [address]   Culture, blood (Routine X 2) w Reflex to ID Panel     Status: None   Collection Time: 03/15/24  8:08 AM   Specimen: BLOOD  Result Value Ref Range Status   Specimen Description BLOOD LEFT ANTECUBITAL  Final   Special Requests   Final    BOTTLES DRAWN AEROBIC AND ANAEROBIC Blood Culture adequate volume   Culture   Final    NO GROWTH 5 DAYS Performed at [facility], [address] [place], [place] [address]    Report Status 03/20/2024 FINAL  Final  Culture, blood (Routine X 2) w Reflex to ID Panel     Status: None   Collection Time: 03/15/24  8:09 AM   Specimen: BLOOD RIGHT HAND  Result Value Ref Range Status   Specimen Description BLOOD RIGHT HAND  Final   Special Requests   Final    BOTTLES DRAWN AEROBIC ONLY Blood Culture results may not be optimal due to an inadequate volume of blood received in culture bottles   Culture   Final    NO GROWTH 5 DAYS Performed at [facility], [address], [place], [place] [address]    Report Status 03/20/2024 FINAL  Final  Urine Culture     Status: Abnormal   Collection Time: 03/15/24  9:00 AM   Specimen: Urine, Random  Result Value Ref Range Status   Specimen Description URINE, RANDOM  Final   Special Requests NONE Reflexed from [id number]  Final   Culture (A)  Final    <10,000  COLONIES/mL INSIGNIFICANT GROWTH Performed at [facility], [address], [place], [place] [address]    Report Status 03/16/2024 FINAL  Final    Labs: CBC: Recent Labs  Lab 03/14/24 0135 03/14/24 0155 03/14/24 0657 03/15/24 0242 03/16/24 0413 03/17/24 0329 03/19/24 0505  WBC 24.8*  --  37.7* 34.5* 16.9* 11.7* 10.6*  NEUTROABS 22.4*  --   --   --   --   --  6.7  HGB 9.8*   < > 10.1* 9.1* 7.3* 7.6* 7.6*  HCT 33.3*   < > 33.6* 30.0* 24.4* 25.2* 25.6*  MCV 95.4  --  94.1 93.2 92.8 93.7 95.5  PLT 428*  --  425* 409* 287 282 349   < > = values in this interval not displayed.   Basic Metabolic Panel: Recent Labs  Lab 03/14/24 0145 03/14/24 0155 03/14/24 9342  03/15/24 0242 03/16/24 0413 03/17/24 0329 03/18/24 0347 03/19/24 0505  NA 139   < > 137 135 132* 133* 132* 139  K 4.3   < > 4.9 4.3 3.8 3.5 3.4* 3.7  CL 100   < > 99 96* 96* 95* 99 105  CO2 28  --  27 28 29 29 30 28   GLUCOSE 108*   < > 109* 142* 92 98 95 91  BUN 32*   < > 37* 33* 29* 24* 19 15  CREATININE 1.45*   < > 1.79* 1.66* 1.45* 1.31* 1.27* 1.02  CALCIUM  8.7*  --  8.9 8.8* 8.4* 8.6* 8.6* 8.3*  MG 1.4*  --  1.3* 1.6*  --  2.0  --   --   PHOS  --   --  3.1 3.1  --   --   --   --    < > = values in this interval not displayed.   Liver Function Tests: Recent Labs  Lab 03/14/24 0145 03/15/24 0242 03/16/24 0413  AST 20 17 22   ALT 13 13 17   ALKPHOS 74 66 67  BILITOT 0.6 0.5 0.7  PROT 6.9 6.3* 5.5*  ALBUMIN  2.1* 1.8* 1.5*   CBG: Recent Labs  Lab 03/19/24 0751 03/19/24 1123 03/19/24 1714 03/19/24 2001 03/20/24 0758  GLUCAP 94 101* 87 92 89    Discharge time spent: approximately 45 minutes spent on discharge counseling, evaluation of patient on day of discharge, and coordination of discharge planning with nursing, social work, pharmacy and case management  Signed: Lonni SHAUNNA Dalton, MD [facility] 03/20/2024

## 2024-03-20 NOTE — TOC Transition Note (Signed)
 Transition of Care [facility]) - Discharge Note   Patient Details  Name: Curtis Hill MRN: [id number] Date of Birth: 1945/07/27  [facility]) CM/SW Contact:  Tom-Johnson, Harvest Muskrat, RN Phone Number: 03/20/2024, 2:18 PM   Clinical Narrative:     Patient is scheduled for discharge today.  Readmission Risk Assessment done. Home health info, outpatient f/u, hospital f/u and discharge instructions on AVS. PTAR scheduled to transport at discharge.  Wife, Curtis Hill notified of discharge. No further ICM needs noted.     Final next level of care: Home/Self Care Barriers to Discharge: Barriers Resolved   Patient Goals and CMS Choice Patient states their goals for this hospitalization and ongoing recovery are:: To return home CMS Medicare.gov Compare Post Acute Care list provided to:: Patient Choice offered to / list presented to : Patient, Spouse      Discharge Placement                Patient to be transferred to facility by: PTAR Name of family member notified: Wife, Curtis Hill    Discharge Plan and Services Additional resources added to the After Visit Summary for                  DME Arranged: N/A                    Social Drivers of Health (SDOH) Interventions SDOH Screenings   Food Insecurity: No Food Insecurity (03/14/2024)  Housing: Low Risk  (03/14/2024)  Transportation Needs: Unmet Transportation Needs (03/14/2024)  Utilities: Not At Risk (03/14/2024)  Depression (PHQ2-9): Medium Risk (05/23/2020)  Social Connections: Moderately Integrated (03/14/2024)  Tobacco Use: Low Risk  (03/13/2024)     Readmission Risk Interventions    03/20/2024    2:14 PM  Readmission Risk Prevention Plan  Transportation Screening Complete  PCP or Specialist Appt within 3-5 Days Complete  HRI or Home Care Consult Complete  Social Work Consult for Recovery Care Planning/Counseling Complete  Palliative Care Screening Not Applicable  Medication Review Furniture Conservator/restorer) Referral to Pharmacy

## 2024-03-21 ENCOUNTER — Other Ambulatory Visit: Payer: Self-pay | Admitting: Surgery

## 2024-03-21 DIAGNOSIS — I7121 Aneurysm of the ascending aorta, without rupture: Secondary | ICD-10-CM

## 2024-03-29 ENCOUNTER — Ambulatory Visit: Admitting: Diagnostic Neuroimaging

## 2024-04-03 ENCOUNTER — Telehealth: Payer: Self-pay | Admitting: Podiatry

## 2024-04-03 NOTE — Telephone Encounter (Signed)
Thank you for updates.

## 2024-04-03 NOTE — Telephone Encounter (Signed)
 Needs a verbal order for: physical therapy, 1x's week for 6 weeks, please call nurse Monique @ [phone]

## 2024-04-04 ENCOUNTER — Other Ambulatory Visit (HOSPITAL_COMMUNITY): Payer: Self-pay | Admitting: Nurse Practitioner

## 2024-04-04 ENCOUNTER — Ambulatory Visit (HOSPITAL_COMMUNITY)
Admission: RE | Admit: 2024-04-04 | Discharge: 2024-04-04 | Disposition: A | Discharge status: Home / Self Care | Source: Ambulatory Visit | Attending: Internal Medicine | Admitting: Internal Medicine

## 2024-04-04 DIAGNOSIS — I7121 Aneurysm of the ascending aorta, without rupture: Secondary | ICD-10-CM | POA: Diagnosis present

## 2024-04-04 DIAGNOSIS — N2889 Other specified disorders of kidney and ureter: Secondary | ICD-10-CM

## 2024-04-19 ENCOUNTER — Ambulatory Visit

## 2024-04-20 ENCOUNTER — Ambulatory Visit

## 2024-04-20 DIAGNOSIS — I7121 Aneurysm of the ascending aorta, without rupture: Secondary | ICD-10-CM | POA: Insufficient documentation

## 2024-04-20 NOTE — Progress Notes (Signed)
 "     [address] [place] [address]             [phone]       CARDIOTHORACIC SURGERY TELEPHONE VIRTUAL OFFICE NOTE  Referring Provider is Okey Carlin Redbird, MD Primary Cardiologist is Wilbert Bihari, MD PCP is Okey Carlin Redbird, MD   HPI:  I spoke with Curtis Hill (DOB [birth year] ) via telephone on 04/20/2024 at 1:53 PM and verified that I was speaking with the correct person using more than one form of identification.  We discussed the fact that I was contacting them from my office in [place] and they were located at home in [place], as well as the reason(s) for conducting our visit virtually instead of in-person.  The patient expressed understanding the circumstances and agreed to proceed as described.   Curtis Hill is a 79 year old man with medical history of hypertension, CAD, pulmonary hypertension, OSA, obesity, iron deficiency anemia, gout and hyperlipidemia who presents via telephone for continued follow-up of ascending thoracic aortic aneurysm.  Aneurysm has stayed stable in size recent CT scan of chest measured 4.1 cm.   He has been checking his blood pressure twice a day and home readings are 110s-120s/70-80s.  He does notice some bradycardia with his pulse being in the high 40s to low 50s.  This started after the initiation of metoprolol .  He denies feeling dizzy, lightheaded and syncopal episodes.  He has close follow-up with his primary care provider and cardiologist.  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  He also has follow-up with neurology for this.   Current Outpatient Medications  Medication Sig Dispense Refill   albuterol  (VENTOLIN  HFA) 108 (90 Base) MCG/ACT inhaler Inhale 2 puffs into the lungs every 6 (six) hours as needed for wheezing or shortness of breath.     allopurinol  (ZYLOPRIM ) 300 MG tablet Take 300 mg by mouth daily.     [Paused] aspirin  EC 81 MG tablet Take 81 mg by mouth daily. Swallow  whole.     atorvastatin  (LIPITOR) 20 MG tablet Take 20 mg by mouth every evening.     calcium  carbonate (TUMS - DOSED IN MG ELEMENTAL CALCIUM ) 500 MG chewable tablet Chew 1 tablet by mouth daily as needed for indigestion or heartburn.     Coenzyme Q10 (COQ10) 200 MG CAPS Take 1 capsule by mouth in the morning.     cyanocobalamin (VITAMIN B12) 1000 MCG tablet Take 1,000 mcg by mouth daily.     furosemide  (LASIX ) 40 MG tablet Take 0.5 tablets (20 mg total) by mouth daily.     gabapentin  (NEURONTIN ) 100 MG capsule Take 100 mg by mouth 3 (three) times daily.     metoprolol  tartrate (LOPRESSOR ) 25 MG tablet Take 0.5 tablets (12.5 mg total) by mouth 2 (two) times daily.     Multiple Vitamins-Minerals (CENTRUM SILVER 50+MEN) TABS Take 1 tablet by mouth in the morning.     nystatin  (MYCOSTATIN /NYSTOP ) powder Apply topically 3 (three) times daily. (Patient not taking: Reported on 03/14/2024)     senna-docusate (SENOKOT-S) 8.6-50 MG tablet Take 1 tablet by mouth at bedtime as needed for mild constipation. (Patient taking differently: Take 1 tablet by mouth daily as needed for mild constipation or moderate constipation.)     simethicone  (MYLICON) 80 MG chewable tablet Chew 80 mg by mouth daily as needed for flatulence.     traMADol  (ULTRAM ) 50  MG tablet Take 1 tablet (50 mg total) by mouth every 6 (six) hours as needed for moderate pain (pain score 4-6) or severe pain (pain score 7-10). (Patient taking differently: Take 50-100 mg by mouth 2 (two) times daily as needed for moderate pain (pain score 4-6) or severe pain (pain score 7-10).) 5 tablet 0   [Paused] valsartan (DIOVAN) 320 MG tablet Take 320 mg by mouth daily.     No current facility-administered medications for this visit.     Diagnostic Tests:  EXAM: CT CHEST WITHOUT CONTRAST 04/04/2024 09:45:46 AM   TECHNIQUE: CT of the chest was performed without the administration of intravenous contrast. Multiplanar reformatted images are provided for  review. Automated exposure control, iterative reconstruction, and/or weight based adjustment of the mA/kV was utilized to reduce the radiation dose to as low as reasonably achievable.   COMPARISON: Chest 311 24 CLINICAL HISTORY: Aortic aneurysm suspected.   FINDINGS:   MEDIASTINUM: Heart and pericardium are unremarkable. Mild aneurysmal dilatation of the ascending thoracic aorta measuring 41 mm on coronal image 119 of series 601. No change from CT comparison 2024 measuring 41 mm in coronal projection. Atherosclerotic calcification of the arteries. The central airways are clear.   LYMPH NODES: No mediastinal, hilar or axillary lymphadenopathy.   LUNGS AND PLEURA: Mild atelectasis of the medial posterior right lung base. No focal consolidation or pulmonary edema. No pleural effusion or pneumothorax.   SOFT TISSUES/BONES: No acute abnormality of the bones or soft tissues.   UPPER ABDOMEN: Limited images of the upper abdomen demonstrate a stable small 1.5 cm exophytic lesion of the right kidney.   IMPRESSION: 1. Mild aneurysmal dilatation of the ascending thoracic aorta measuring 41 mm. No change from CT 3 11 2024 2. Stable 1.5 cm exophytic right renal lesion.   Electronically signed by: Norleen Boxer MD 04/04/2024 11:07 AM EST RP Workstation: [id number]       Plan: Aneurysm of ascending aorta without rupture - 4.1 cm ascending thoracic aortic aneurysm on CT scan of chest. -We discussed the natural history and and risk factors for growth of ascending aortic aneurysms. Discussed recommendations to minimize the risk of further expansion or dissection including careful blood pressure control, avoidance of contact sports and heavy lifting, attention to lipid management.  We covered the importance of staying never user of tobacco.  The patient does not yet meet surgical criteria of >5.5cm. The patient is aware of signs and symptoms of aortic dissection and when to present to the  emergency department   -Follow-up in 1 year with CT scan of chest for continued surveillance. Possible virtual visit if unable to get to office due to mobility     I discussed limitations of evaluation and management via telephone.  The patient was advised to call back for repeat telephone consultation or to seek an in-person evaluation if questions arise or the patient's clinical condition changes in any significant manner.  I spent in excess of 20 minutes of non-face-to-face time during the conduct of this telephone virtual office consultation, including pre-visit review of the patient's records and direct conversation with the patient.   Level 1  (99441)             5-10 minutes Level 2  (99442)            11-20 minutes Level 3  (99443)            21-30 minutes   Manuelita CHRISTELLA Rough, PA-C 04/20/2024 1:53 PM  "

## 2024-04-21 NOTE — Patient Instructions (Signed)
 Risk Modification in those with ascending thoracic aortic aneurysm:   Continue control of blood pressure (prefer BP 130/80 or less)   2. Avoid fluoroquinolone antibiotics (I.e Ciprofloxacin, Avelox, Levofloxacin, Ofloxacin)   3.  Use of statin (to decrease cardiovascular risk)   4.  Exercise and activity limitations is individualized, but in general, contact sports are to be avoided and one should avoid heavy lifting (defined as half of ideal body weight) and exercises involving sustained Valsalva maneuver.   5.  Follow-up in one year with CT chest.

## 2024-05-02 ENCOUNTER — Ambulatory Visit (HOSPITAL_COMMUNITY)
Admission: RE | Admit: 2024-05-02 | Discharge: 2024-05-02 | Disposition: A | Discharge status: Home / Self Care | Source: Ambulatory Visit | Attending: Nurse Practitioner | Admitting: Nurse Practitioner

## 2024-05-02 DIAGNOSIS — N2889 Other specified disorders of kidney and ureter: Secondary | ICD-10-CM | POA: Diagnosis present

## 2024-05-02 MED ORDER — IOHEXOL 300 MG/ML  SOLN
100.0000 mL | Freq: Once | INTRAMUSCULAR | Status: AC | PRN
  Administered 2024-05-02: 100 mL via INTRAVENOUS

## 2024-05-03 ENCOUNTER — Ambulatory Visit

## 2024-05-09 ENCOUNTER — Telehealth: Payer: Self-pay | Admitting: Diagnostic Neuroimaging

## 2024-05-09 NOTE — Telephone Encounter (Signed)
 Patient have to come to appointment on a stretcher and do not have transportation for that appointment. Patient said needs an earlier appointment due to have not been able to walk since May; 2025 and now has become urgent. Would like a call back to discuss to get an  earlier may need to reschedule another physician.

## 2024-05-16 ENCOUNTER — Ambulatory Visit: Admitting: Diagnostic Neuroimaging

## 2024-06-14 ENCOUNTER — Ambulatory Visit: Admitting: Neurology

## 2024-09-25 ENCOUNTER — Ambulatory Visit: Admitting: Diagnostic Neuroimaging
